# Patient Record
Sex: Female | Born: 1935 | Race: Black or African American | Hispanic: No | Smoking: Never smoker
Health system: Southern US, Community
[De-identification: ages and names within clinical notes are randomized; demographics above are authoritative.]

## PROBLEM LIST (undated history)

## (undated) DIAGNOSIS — IMO0002 Reserved for concepts with insufficient information to code with codable children: Secondary | ICD-10-CM

## (undated) DIAGNOSIS — Z91148 Patient's other noncompliance with medication regimen for other reason: Secondary | ICD-10-CM

## (undated) DIAGNOSIS — L0291 Cutaneous abscess, unspecified: Secondary | ICD-10-CM

## (undated) DIAGNOSIS — H409 Unspecified glaucoma: Secondary | ICD-10-CM

## (undated) DIAGNOSIS — K55039 Acute (reversible) ischemia of large intestine, extent unspecified: Secondary | ICD-10-CM

## (undated) DIAGNOSIS — M1990 Unspecified osteoarthritis, unspecified site: Secondary | ICD-10-CM

## (undated) DIAGNOSIS — I10 Essential (primary) hypertension: Secondary | ICD-10-CM

## (undated) DIAGNOSIS — G8929 Other chronic pain: Secondary | ICD-10-CM

## (undated) DIAGNOSIS — I639 Cerebral infarction, unspecified: Secondary | ICD-10-CM

## (undated) DIAGNOSIS — T7840XA Allergy, unspecified, initial encounter: Secondary | ICD-10-CM

## (undated) DIAGNOSIS — I701 Atherosclerosis of renal artery: Secondary | ICD-10-CM

## (undated) DIAGNOSIS — L089 Local infection of the skin and subcutaneous tissue, unspecified: Secondary | ICD-10-CM

## (undated) DIAGNOSIS — E039 Hypothyroidism, unspecified: Secondary | ICD-10-CM

## (undated) DIAGNOSIS — I2510 Atherosclerotic heart disease of native coronary artery without angina pectoris: Secondary | ICD-10-CM

## (undated) DIAGNOSIS — M545 Low back pain, unspecified: Secondary | ICD-10-CM

## (undated) DIAGNOSIS — N189 Chronic kidney disease, unspecified: Secondary | ICD-10-CM

## (undated) DIAGNOSIS — G459 Transient cerebral ischemic attack, unspecified: Secondary | ICD-10-CM

## (undated) DIAGNOSIS — R7881 Bacteremia: Secondary | ICD-10-CM

## (undated) DIAGNOSIS — M48062 Spinal stenosis, lumbar region with neurogenic claudication: Secondary | ICD-10-CM

## (undated) DIAGNOSIS — I739 Peripheral vascular disease, unspecified: Secondary | ICD-10-CM

## (undated) DIAGNOSIS — K219 Gastro-esophageal reflux disease without esophagitis: Secondary | ICD-10-CM

## (undated) DIAGNOSIS — S62633A Displaced fracture of distal phalanx of left middle finger, initial encounter for closed fracture: Secondary | ICD-10-CM

## (undated) DIAGNOSIS — I809 Phlebitis and thrombophlebitis of unspecified site: Secondary | ICD-10-CM

## (undated) DIAGNOSIS — F419 Anxiety disorder, unspecified: Secondary | ICD-10-CM

## (undated) DIAGNOSIS — E785 Hyperlipidemia, unspecified: Secondary | ICD-10-CM

## (undated) DIAGNOSIS — H269 Unspecified cataract: Secondary | ICD-10-CM

## (undated) DIAGNOSIS — Z9114 Patient's other noncompliance with medication regimen: Secondary | ICD-10-CM

## (undated) DIAGNOSIS — I219 Acute myocardial infarction, unspecified: Secondary | ICD-10-CM

## (undated) HISTORY — DX: Anxiety disorder, unspecified: F41.9

## (undated) HISTORY — PX: FEMORAL BYPASS: SHX50

## (undated) HISTORY — DX: Chronic kidney disease, unspecified: N18.9

## (undated) HISTORY — PX: CATARACT EXTRACTION, BILATERAL: SHX1313

## (undated) HISTORY — DX: Unspecified glaucoma: H40.9

## (undated) HISTORY — DX: Unspecified cataract: H26.9

## (undated) HISTORY — DX: Peripheral vascular disease, unspecified: I73.9

## (undated) HISTORY — PX: EYE SURGERY: SHX253

## (undated) HISTORY — DX: Transient cerebral ischemic attack, unspecified: G45.9

## (undated) HISTORY — DX: Hyperlipidemia, unspecified: E78.5

## (undated) HISTORY — DX: Local infection of the skin and subcutaneous tissue, unspecified: L08.9

## (undated) HISTORY — PX: CORONARY ARTERY BYPASS GRAFT: SHX141

## (undated) HISTORY — DX: Cerebral infarction, unspecified: I63.9

## (undated) HISTORY — DX: Atherosclerotic heart disease of native coronary artery without angina pectoris: I25.10

## (undated) HISTORY — DX: Essential (primary) hypertension: I10

## (undated) HISTORY — DX: Allergy, unspecified, initial encounter: T78.40XA

---

## 2001-04-15 DIAGNOSIS — R7881 Bacteremia: Secondary | ICD-10-CM

## 2001-04-15 DIAGNOSIS — L0291 Cutaneous abscess, unspecified: Secondary | ICD-10-CM

## 2001-04-15 HISTORY — DX: Cutaneous abscess, unspecified: L02.91

## 2001-04-15 HISTORY — DX: Bacteremia: R78.81

## 2001-04-15 HISTORY — PX: CORONARY ANGIOPLASTY WITH STENT PLACEMENT: SHX49

## 2001-05-12 ENCOUNTER — Ambulatory Visit (HOSPITAL_COMMUNITY): Admission: RE | Admit: 2001-05-12 | Discharge: 2001-05-13 | Payer: Self-pay | Admitting: *Deleted

## 2001-05-12 ENCOUNTER — Encounter: Payer: Self-pay | Admitting: *Deleted

## 2001-10-13 HISTORY — PX: INCISION AND DRAINAGE ANTERIOR NECK: SHX1795

## 2001-10-25 ENCOUNTER — Inpatient Hospital Stay (HOSPITAL_COMMUNITY): Admission: EM | Admit: 2001-10-25 | Discharge: 2001-10-28 | Payer: Self-pay

## 2001-10-30 ENCOUNTER — Emergency Department (HOSPITAL_COMMUNITY): Admission: EM | Admit: 2001-10-30 | Discharge: 2001-10-30 | Payer: Self-pay | Admitting: Emergency Medicine

## 2001-11-01 ENCOUNTER — Inpatient Hospital Stay (HOSPITAL_COMMUNITY): Admission: EM | Admit: 2001-11-01 | Discharge: 2001-11-18 | Payer: Self-pay | Admitting: Emergency Medicine

## 2001-11-01 ENCOUNTER — Encounter: Payer: Self-pay | Admitting: Emergency Medicine

## 2001-11-01 ENCOUNTER — Encounter (INDEPENDENT_AMBULATORY_CARE_PROVIDER_SITE_OTHER): Payer: Self-pay | Admitting: *Deleted

## 2001-11-02 ENCOUNTER — Encounter: Payer: Self-pay | Admitting: Internal Medicine

## 2001-11-02 ENCOUNTER — Encounter: Payer: Self-pay | Admitting: Cardiology

## 2001-11-03 ENCOUNTER — Encounter: Payer: Self-pay | Admitting: Internal Medicine

## 2001-11-04 ENCOUNTER — Encounter: Payer: Self-pay | Admitting: Internal Medicine

## 2001-11-04 ENCOUNTER — Encounter: Payer: Self-pay | Admitting: Surgery

## 2001-11-08 ENCOUNTER — Encounter: Payer: Self-pay | Admitting: Internal Medicine

## 2001-11-13 ENCOUNTER — Encounter: Payer: Self-pay | Admitting: Internal Medicine

## 2001-11-25 ENCOUNTER — Ambulatory Visit (HOSPITAL_COMMUNITY): Admission: RE | Admit: 2001-11-25 | Discharge: 2001-11-25 | Payer: Self-pay | Admitting: Infectious Diseases

## 2001-12-30 ENCOUNTER — Encounter: Admission: RE | Admit: 2001-12-30 | Discharge: 2001-12-30 | Payer: Self-pay | Admitting: Internal Medicine

## 2002-01-11 ENCOUNTER — Inpatient Hospital Stay (HOSPITAL_COMMUNITY): Admission: EM | Admit: 2002-01-11 | Discharge: 2002-01-13 | Payer: Self-pay | Admitting: Emergency Medicine

## 2002-03-03 ENCOUNTER — Encounter: Admission: RE | Admit: 2002-03-03 | Discharge: 2002-03-03 | Payer: Self-pay | Admitting: Internal Medicine

## 2002-03-08 ENCOUNTER — Encounter: Payer: Self-pay | Admitting: Internal Medicine

## 2002-03-08 ENCOUNTER — Ambulatory Visit (HOSPITAL_COMMUNITY): Admission: RE | Admit: 2002-03-08 | Discharge: 2002-03-08 | Payer: Self-pay | Admitting: Internal Medicine

## 2002-03-15 ENCOUNTER — Encounter: Admission: RE | Admit: 2002-03-15 | Discharge: 2002-03-15 | Payer: Self-pay | Admitting: Internal Medicine

## 2002-03-17 ENCOUNTER — Encounter: Admission: RE | Admit: 2002-03-17 | Discharge: 2002-03-17 | Payer: Self-pay | Admitting: Internal Medicine

## 2002-03-24 ENCOUNTER — Encounter: Admission: RE | Admit: 2002-03-24 | Discharge: 2002-03-24 | Payer: Self-pay | Admitting: Internal Medicine

## 2002-11-29 ENCOUNTER — Other Ambulatory Visit: Admission: RE | Admit: 2002-11-29 | Discharge: 2002-11-29 | Payer: Self-pay | Admitting: Family Medicine

## 2003-05-25 ENCOUNTER — Ambulatory Visit (HOSPITAL_COMMUNITY): Admission: RE | Admit: 2003-05-25 | Discharge: 2003-05-25 | Payer: Self-pay | Admitting: Family Medicine

## 2003-08-09 ENCOUNTER — Ambulatory Visit (HOSPITAL_COMMUNITY): Admission: RE | Admit: 2003-08-09 | Discharge: 2003-08-09 | Payer: Self-pay | Admitting: Gastroenterology

## 2004-04-11 ENCOUNTER — Ambulatory Visit: Payer: Self-pay | Admitting: Cardiology

## 2004-04-15 DIAGNOSIS — M48062 Spinal stenosis, lumbar region with neurogenic claudication: Secondary | ICD-10-CM

## 2004-04-15 HISTORY — DX: Spinal stenosis, lumbar region with neurogenic claudication: M48.062

## 2004-04-18 ENCOUNTER — Ambulatory Visit: Payer: Self-pay

## 2004-04-18 ENCOUNTER — Ambulatory Visit: Payer: Self-pay | Admitting: Cardiology

## 2004-04-23 ENCOUNTER — Ambulatory Visit: Payer: Self-pay | Admitting: Family Medicine

## 2004-05-11 ENCOUNTER — Encounter: Admission: RE | Admit: 2004-05-11 | Discharge: 2004-05-11 | Payer: Self-pay | Admitting: Family Medicine

## 2004-05-31 ENCOUNTER — Ambulatory Visit: Payer: Self-pay | Admitting: Cardiology

## 2004-06-13 HISTORY — PX: ENDOSCOPIC HEMILAMINOTOMY W/ DISCECTOMY LUMBAR: SUR439

## 2004-07-04 ENCOUNTER — Inpatient Hospital Stay (HOSPITAL_COMMUNITY): Admission: RE | Admit: 2004-07-04 | Discharge: 2004-07-10 | Payer: Self-pay | Admitting: Neurosurgery

## 2005-02-07 ENCOUNTER — Ambulatory Visit (HOSPITAL_COMMUNITY): Admission: RE | Admit: 2005-02-07 | Discharge: 2005-02-07 | Payer: Self-pay | Admitting: Family Medicine

## 2005-06-18 ENCOUNTER — Ambulatory Visit: Payer: Self-pay | Admitting: Cardiology

## 2005-07-02 ENCOUNTER — Encounter: Admission: RE | Admit: 2005-07-02 | Discharge: 2005-07-02 | Payer: Self-pay | Admitting: Surgery

## 2006-06-19 ENCOUNTER — Ambulatory Visit: Payer: Self-pay | Admitting: Cardiology

## 2007-06-25 ENCOUNTER — Ambulatory Visit (HOSPITAL_COMMUNITY): Admission: RE | Admit: 2007-06-25 | Discharge: 2007-06-25 | Payer: Self-pay | Admitting: Family Medicine

## 2007-07-10 ENCOUNTER — Ambulatory Visit: Payer: Self-pay | Admitting: Cardiology

## 2007-09-16 ENCOUNTER — Encounter: Admission: RE | Admit: 2007-09-16 | Discharge: 2007-09-16 | Payer: Self-pay | Admitting: Surgery

## 2008-01-13 ENCOUNTER — Ambulatory Visit: Payer: Self-pay | Admitting: Cardiology

## 2008-02-26 ENCOUNTER — Encounter: Admission: RE | Admit: 2008-02-26 | Discharge: 2008-02-26 | Payer: Self-pay | Admitting: Internal Medicine

## 2008-04-11 DIAGNOSIS — I739 Peripheral vascular disease, unspecified: Secondary | ICD-10-CM | POA: Insufficient documentation

## 2008-04-11 DIAGNOSIS — I2510 Atherosclerotic heart disease of native coronary artery without angina pectoris: Secondary | ICD-10-CM

## 2008-04-11 DIAGNOSIS — I10 Essential (primary) hypertension: Secondary | ICD-10-CM

## 2008-04-11 DIAGNOSIS — E785 Hyperlipidemia, unspecified: Secondary | ICD-10-CM

## 2008-07-12 ENCOUNTER — Encounter: Payer: Self-pay | Admitting: Cardiology

## 2008-07-12 ENCOUNTER — Ambulatory Visit: Payer: Self-pay | Admitting: Cardiology

## 2008-07-21 ENCOUNTER — Ambulatory Visit: Payer: Self-pay

## 2008-07-21 ENCOUNTER — Ambulatory Visit: Payer: Self-pay | Admitting: Cardiology

## 2008-07-21 LAB — CONVERTED CEMR LAB
Alkaline Phosphatase: 113 units/L (ref 39–117)
Cholesterol: 200 mg/dL (ref 0–200)
LDL Cholesterol: 130 mg/dL — ABNORMAL HIGH (ref 0–99)
Triglycerides: 73 mg/dL (ref 0.0–149.0)

## 2008-09-13 HISTORY — PX: CORONARY ANGIOPLASTY WITH STENT PLACEMENT: SHX49

## 2008-09-27 ENCOUNTER — Ambulatory Visit: Payer: Self-pay | Admitting: Cardiology

## 2008-09-27 ENCOUNTER — Inpatient Hospital Stay (HOSPITAL_COMMUNITY): Admission: EM | Admit: 2008-09-27 | Discharge: 2008-10-01 | Payer: Self-pay | Admitting: Emergency Medicine

## 2008-09-27 ENCOUNTER — Encounter: Payer: Self-pay | Admitting: Cardiology

## 2008-09-28 ENCOUNTER — Encounter: Payer: Self-pay | Admitting: Cardiology

## 2008-09-30 ENCOUNTER — Encounter: Payer: Self-pay | Admitting: Cardiology

## 2008-10-04 ENCOUNTER — Telehealth: Payer: Self-pay | Admitting: Cardiology

## 2008-10-20 ENCOUNTER — Encounter: Payer: Self-pay | Admitting: Cardiology

## 2008-10-21 ENCOUNTER — Ambulatory Visit: Payer: Self-pay | Admitting: Cardiology

## 2008-11-02 ENCOUNTER — Telehealth: Payer: Self-pay | Admitting: Cardiology

## 2008-12-20 ENCOUNTER — Ambulatory Visit: Payer: Self-pay | Admitting: Cardiology

## 2009-04-21 ENCOUNTER — Encounter (INDEPENDENT_AMBULATORY_CARE_PROVIDER_SITE_OTHER): Payer: Self-pay | Admitting: *Deleted

## 2009-05-05 ENCOUNTER — Ambulatory Visit: Payer: Self-pay | Admitting: Internal Medicine

## 2009-05-05 ENCOUNTER — Inpatient Hospital Stay (HOSPITAL_COMMUNITY): Admission: EM | Admit: 2009-05-05 | Discharge: 2009-05-10 | Payer: Self-pay | Admitting: Emergency Medicine

## 2009-06-07 ENCOUNTER — Telehealth (INDEPENDENT_AMBULATORY_CARE_PROVIDER_SITE_OTHER): Payer: Self-pay | Admitting: *Deleted

## 2009-06-07 ENCOUNTER — Encounter: Payer: Self-pay | Admitting: Cardiology

## 2009-06-08 ENCOUNTER — Ambulatory Visit: Payer: Self-pay | Admitting: Cardiology

## 2009-07-27 ENCOUNTER — Encounter: Payer: Self-pay | Admitting: Cardiology

## 2009-07-27 ENCOUNTER — Ambulatory Visit: Payer: Self-pay

## 2009-11-02 ENCOUNTER — Telehealth: Payer: Self-pay | Admitting: Cardiology

## 2010-05-03 ENCOUNTER — Telehealth: Payer: Self-pay | Admitting: Cardiology

## 2010-05-06 ENCOUNTER — Encounter: Payer: Self-pay | Admitting: Family Medicine

## 2010-05-15 NOTE — Progress Notes (Signed)
Summary: stop plavix & asa  Phone Note From Other Clinic   Caller: nurse Santiago Glad  Summary of Call: pt needs to have colonscopy and needs to stop plavix and ASA Saturday. Is pt ok to do this?? Ofc [phone] fax [phone] Initial call taken by: Lorraine Lax,  November 02, 2009 2:25 PM  Follow-up for Phone Call        I talked with DOD Dr. Ron Parker.  Pt cannot come off either ASA or Plavix.  [facility] nurse Santiago Glad is aware of this and will contact [name] to see if she still  wants to have the study done.  She understands if intervention is indicated it will need to be  done at a later time after discussing results with Dr. Percival Spanish because she is on Plavix and ASA.  She will also be reminded of 6 month follow up with Dr. Percival Spanish which is due in August. Joelyn Oms RN

## 2010-05-15 NOTE — Assessment & Plan Note (Signed)
Summary: APPT @ 4:15/EPH/JML   Visit Type:  Follow-up Primary Provider:  Dr. Henrene Pastor ([place])  CC:  CAD.  History of Present Illness: The patient presents for followup after a recent hospitalization for management of a non-Q-wave myocardial infarction. B. anatomy as described below. Ultimately she was managed medically. Since that time she has done relatively well. She has had none of the chest discomfort that prompted her previous hospitalization. She has not required any nitroglycerin. He she has been breathing well. She is able to lie flat back or PND or orthopnea. She does have some occasional pain in her thigh on the right side in particular similar to previous claudication.  Current Medications (verified): 1)  Calcium .... Daily 2)  Simvastatin 40 Mg Tabs (Simvastatin) .... One By Mouth Qhs 3)  Vitamin D3 .... Daily 4)  Nitroglycerin 0.4 Mg Subl (Nitroglycerin) .... As Needed 5)  Ramipril 10 Mg Caps (Ramipril) .... One By Mouth Daily 6)  Ultram 50 Mg Tabs (Tramadol Hcl) .... As Needed 7)  Metoprolol Succinate 50 Mg Xr24h-Tab (Metoprolol Succinate) .... One By Mouth Daily 8)  Aspirin 325 Mg  Tabs (Aspirin) .... Daily 9)  Multivitamins   Tabs (Multiple Vitamin) .... Daily 10)  Fosamax 70 Mg Tabs (Alendronate Sodium) .Marland Kitchen.. 1po Weekly 11)  Vitamin B-12 250 Mcg Tabs (Cyanocobalamin) .Marland Kitchen.. 1 By Mouth Daily 12)  Lumigan 0.03 % Soln (Bimatoprost) .... As Directed 13)  Plavix 75 Mg Tabs (Clopidogrel Bisulfate) .Marland Kitchen.. 1 By Mouth Daily 14)  Alphagan P 0.15 % Soln (Brimonidine Tartrate) .... As Directed  Allergies (verified): 1)  ! Penicillin 2)  ! Benadryl  Past History:  Past Medical History: CAD (see below) Hyperlipidemia Hypertension Peripheral vascular disease Hyperparathyroidism  Review of Systems       As stated in the HPI and negative for all other systems.   Vital Signs:  Patient profile:   77 year old female Height:      62 inches Weight:      118 pounds BMI:      21.66 Pulse rate:   55 / minute Resp:     16 per minute BP sitting:   116 / 62  (right arm)  Vitals Entered By: Levora Angel, CNA (June 08, 2009 4:16 PM)  Physical Exam  General:  Well developed, well nourished, in no acute distress. Head:  normocephalic and atraumatic Eyes:  PERRLA/EOM intact; conjunctiva and lids normal. Mouth:  Teeth, gums and palate normal. Oral mucosa normal. Neck:  Neck supple, no JVD. No masses, thyromegaly or abnormal cervical nodes. Chest Wall:  no deformities or breast masses noted Lungs:  Clear bilaterally to auscultation and percussion. Abdomen:  Bowel sounds positive; abdomen soft and non-tender without masses, organomegaly, or hernias noted. No hepatosplenomegaly. Msk:  Back normal, normal gait. Muscle strength and tone normal. Extremities:  No clubbing or cyanosis. Neurologic:  Alert and oriented x 3. Skin:  Intact without lesions or rashes. Psych:  Normal affect.   Detailed Cardiovascular Exam  Neck    Carotids: Carotids full and equal bilaterally without bruits.      Neck Veins: Normal, no JVD.    Heart    Inspection: no deformities or lifts noted.      Palpation: normal PMI with no thrills palpable.      Auscultation: regular rate and rhythm, S1, S2 without murmurs, rubs, gallops, or clicks.    Vascular    Abdominal Aorta: no palpable masses, pulsations, or audible bruits.      Pedal  Pulses: absent right dorsalis pedis pulse, absent right posterior tibial pulse, absent left dorsalis pedis pulse, and absent left posterior tibial pulse.      Radial Pulses: normal radial pulses bilaterally.      Peripheral Circulation: no clubbing, cyanosis, or edema noted with normal capillary refill.     Cardiac Cath  Procedure date:  05/09/2009  Findings:      1. Cardiac catheterization dated May 08, 2009, revealing a     moderate-sized obtuse marginal with a 90% stenosis at the ostium     with moderate, but not flow limiting stenosis in the  right coronary     artery, which was found to be in-stent restenosis. 2. Status post repeat cardiac catheterization, May 09, 2008, per     Dr. Olevia Perches, finding the lesion in the ostium of the circumflex     artery does not appear to be flow limiting and the lesion just past     the ramus in the circumflex artery, did not appear to be flow     limiting, it is still possible that these lesions could be     responsible for the patient's ACS, it is possible that could be a     ruptured plaque with possible distal embolization.  Nevertheless,     this lesion is very unfavorable for PCI.  Medical therapy is     indicated.  EKG  Procedure date:  06/08/2009  Findings:      sinus bradycardia, rate 55, axis within normal limits, intervals within normal limits, no acute ST-T wave changes.  Impression & Recommendations:  Problem # 1:  CAD, NATIVE VESSEL (ICD-414.01) The the patient has CAD as described. She is being managed medically and has no ongoing pain. No further cardiovascular testing is suggested. Orders: EKG w/ Interpretation (93000)  Problem # 2:  HYPERTENSION, BENIGN (ICD-401.1) Her blood pressure is well controlled. She will continue on the meds as listed. Orders: EKG w/ Interpretation (93000)  Problem # 3:  HYPERLIPIDEMIA TYPE IIB / III (ICD-272.2) I reviewed the lipid profile drawn in the hospital with a LDL of 98 and HDL of 61. This is acceptable and she will remain on the meds as listed but ultimately I would like an LDL less than 70. Her updated medication list for this problem includes:    Simvastatin 40 Mg Tabs (Simvastatin) ..... One by mouth qhs  Problem # 4:  PVD (ICD-443.9) She does have claudication. I reviewed her peripheral basilar studies from a few years ago and her ABIs were slightly improved at 0.77 bilaterally. I will repeat these.  Other Orders: Arterial Duplex Lower Extremity (Arterial Duplex Low)  Patient Instructions: 1)  Your physician recommends  that you schedule a follow-up appointment in: 6 months with Dr Percival Spanish 2)  Your physician recommends that you continue on your current medications as directed. Please refer to the Current Medication list given to you today. 3)  Your physician has requested that you have a lower extremity arterial exercise duplex.  During this test, exercise and ultrasound are used to evaluate arterial blood flow in the legs.  Allow one hour for this exam. There are no restrictions or special instructions. 4)  Your physician has requested that you have a lower or upper extremity arterial duplex.  This test is an ultrasound of the arteries in the legs or arms.  It looks at arterial blood flow in the legs and arms.  Allow one hour for Lower and Upper Arterial scans. There  are no restrictions or special instructions.

## 2010-05-15 NOTE — Progress Notes (Signed)
  Faxed records over to [facility]. to fax [place]  June 07, 2009 2:04 PM

## 2010-05-15 NOTE — Miscellaneous (Signed)
  Clinical Lists Changes  Observations: Added new observation of CARDCATHFIND: CONCLUSION:  Negative fractional flow reserve (FFR) of the ostial lesion of the circumflex artery with a value of 0.96.   RECOMMENDATIONS:  Based on these findings, the lesion in the ostium and circumflex artery does not appear to be flow-limiting and the lesion just past the ramus in the circumflex artery also does not appear to be flow-limiting. It is still possible that these lesions could be responsible for the patient's ACS.  It is possible it could be a ruptured plaque with possible distal embolization.  Nevertheless, this lesion is very unfavorable for PCI and I think medical therapy is indicated.     (05/10/2009 10:21) Added new observation of CARDCATHFIND: IMPRESSION:  This is an 77 year old status post non-ST elevation myocardial infarction.  I think the culprit vessel was probably a branch of the large high obtuse marginal.  This branch vessel is a moderate- sized vessel with a 90% stenosis at the ostium.  It is fairly distal.  The ostial circumflex is hazy in caudal views but in cranial views appears to have only mild stenosis.  There is also moderate, but not flow-limiting in-stent restenosis of the right coronary artery.  Ejection fraction is preserved.  The patient is to continue on Plavix.  I will discuss the case with Dr. Lia Foyer. (05/08/2009 10:22)      Cardiac Cath  Procedure date:  05/10/2009  Findings:      CONCLUSION:  Negative fractional flow reserve (FFR) of the ostial lesion of the circumflex artery with a value of 0.96.   RECOMMENDATIONS:  Based on these findings, the lesion in the ostium and circumflex artery does not appear to be flow-limiting and the lesion just past the ramus in the circumflex artery also does not appear to be flow-limiting. It is still possible that these lesions could be responsible for the patient's ACS.  It is possible it could be a ruptured  plaque with possible distal embolization.  Nevertheless, this lesion is very unfavorable for PCI and I think medical therapy is indicated.      Cardiac Cath  Procedure date:  05/08/2009  Findings:      IMPRESSION:  This is an 77 year old status post non-ST elevation myocardial infarction.  I think the culprit vessel was probably a branch of the large high obtuse marginal.  This branch vessel is a moderate- sized vessel with a 90% stenosis at the ostium.  It is fairly distal.  The ostial circumflex is hazy in caudal views but in cranial views appears to have only mild stenosis.  There is also moderate, but not flow-limiting in-stent restenosis of the right coronary artery.  Ejection fraction is preserved.  The patient is to continue on Plavix.  I will discuss the case with Dr. Lia Foyer.

## 2010-05-15 NOTE — Letter (Signed)
Summary: Appointment - [facility] [place] [place]  [address] [place]   [place], [place] [address]   Phone: [phone]  Fax: [phone]     April 21, 2009 MRN: [id number]   ANAJAH GIAMPIETRO [place], [place]  [address]   Dear Ms. Matsuoka,  Our records indicate that it is time to schedule a follow-up appointment. Dr.Hochrein recommended that you follow up with [place] in March,2011. It is very important that we reach you to schedule this appointment. We look forward to participating in your health care needs. Please contact us at the number listed above at your earliest convenience to schedule your appointment.  If you are unable to make an appointment at this time, give [place] a call so we can update our records.     Sincerely,   Public relations account executive

## 2010-05-17 NOTE — Progress Notes (Signed)
Summary: rx refill  Phone Note Refill Request Message from:  Patient on May 03, 2010 4:32 PM  Refills Requested: Medication #1:  METOPROLOL SUCCINATE 50 MG XR24H-TAB one by mouth daily  Medication #2:  PLAVIX 75 MG TABS 1 by mouth daily [facility] # [phone]   Method Requested: Telephone to Pharmacy Initial call taken by: Regan Lemming,  May 03, 2010 4:32 PM  Follow-up for Phone Call        Pt needs to make an appt with Dr. Percival Spanish to f/u. Levora Angel, CNA  May 04, 2010 11:04 AM  Follow-up by: Levora Angel, CNA,  May 04, 2010 11:04 AM    New/Updated Medications: METOPROLOL SUCCINATE 50 MG XR24H-TAB (METOPROLOL SUCCINATE) one by mouth daily PLAVIX 75 MG TABS (CLOPIDOGREL BISULFATE) 1 by mouth daily Prescriptions: METOPROLOL SUCCINATE 50 MG XR24H-TAB (METOPROLOL SUCCINATE) one by mouth daily  #30 x 1   Entered by:   Levora Angel, CNA   Authorized by:   Minus Breeding, MD, [facility]   Signed by:   Levora Angel, CNA on 05/04/2010   Method used:   Electronically to        [facility] [id number] (retail)       [address] [place] [address] [place], [place]  [address]       Ph: [phone]       Fax: [phone]   [id number] PLAVIX 75 MG TABS (CLOPIDOGREL BISULFATE) 1 by mouth daily  #30 x 1   Entered by:   Levora Angel, CNA   Authorized by:   Minus Breeding, MD, [facility]   Signed by:   Levora Angel, CNA on 05/04/2010   Method used:   Electronically to        [facility] [id number] (retail)       [address] [place] [address] [place], [place]  [address]       Ph: [phone]       Fax: [phone]   RxID:   [id number]

## 2010-07-01 LAB — CBC
HCT: 32.7 % — ABNORMAL LOW (ref 36.0–46.0)
HCT: 33.6 % — ABNORMAL LOW (ref 36.0–46.0)
HCT: 35.3 % — ABNORMAL LOW (ref 36.0–46.0)
HCT: 36 % (ref 36.0–46.0)
HCT: 37.5 % (ref 36.0–46.0)
Hemoglobin: 10.8 g/dL — ABNORMAL LOW (ref 12.0–15.0)
Hemoglobin: 11.4 g/dL — ABNORMAL LOW (ref 12.0–15.0)
Hemoglobin: 11.6 g/dL — ABNORMAL LOW (ref 12.0–15.0)
Hemoglobin: 12.3 g/dL (ref 12.0–15.0)
Hemoglobin: 12.6 g/dL (ref 12.0–15.0)
MCHC: 33.6 g/dL (ref 30.0–36.0)
MCHC: 34 g/dL (ref 30.0–36.0)
MCHC: 34.8 g/dL (ref 30.0–36.0)
MCV: 94.8 fL (ref 78.0–100.0)
MCV: 95 fL (ref 78.0–100.0)
MCV: 95.4 fL (ref 78.0–100.0)
MCV: 95.7 fL (ref 78.0–100.0)
Platelets: 153 10*3/uL (ref 150–400)
Platelets: 153 10*3/uL (ref 150–400)
RBC: 3.31 MIL/uL — ABNORMAL LOW (ref 3.87–5.11)
RBC: 3.45 MIL/uL — ABNORMAL LOW (ref 3.87–5.11)
RBC: 3.51 MIL/uL — ABNORMAL LOW (ref 3.87–5.11)
RBC: 3.69 MIL/uL — ABNORMAL LOW (ref 3.87–5.11)
RBC: 3.94 MIL/uL (ref 3.87–5.11)
RDW: 13.1 % (ref 11.5–15.5)
RDW: 13.4 % (ref 11.5–15.5)
WBC: 10.6 10*3/uL — ABNORMAL HIGH (ref 4.0–10.5)
WBC: 6.6 10*3/uL (ref 4.0–10.5)
WBC: 7.1 10*3/uL (ref 4.0–10.5)
WBC: 8.9 K/uL (ref 4.0–10.5)

## 2010-07-01 LAB — CK TOTAL AND CKMB (NOT AT ARMC)
CK, MB: 4 ng/mL (ref 0.3–4.0)
Relative Index: 1.9 (ref 0.0–2.5)
Total CK: 212 U/L — ABNORMAL HIGH (ref 7–177)

## 2010-07-01 LAB — TROPONIN I: Troponin I: 0.07 ng/mL — ABNORMAL HIGH (ref 0.00–0.06)

## 2010-07-01 LAB — CARDIAC PANEL(CRET KIN+CKTOT+MB+TROPI)
CK, MB: 8.8 ng/mL (ref 0.3–4.0)
Relative Index: 3.6 — ABNORMAL HIGH (ref 0.0–2.5)
Total CK: 391 U/L — ABNORMAL HIGH (ref 7–177)
Troponin I: 3.43 ng/mL (ref 0.00–0.06)
Troponin I: 3.94 ng/mL (ref 0.00–0.06)

## 2010-07-01 LAB — APTT: aPTT: 25 seconds (ref 24–37)

## 2010-07-01 LAB — COMPREHENSIVE METABOLIC PANEL
ALT: 37 U/L — ABNORMAL HIGH (ref 0–35)
Calcium: 10.6 mg/dL — ABNORMAL HIGH (ref 8.4–10.5)
Chloride: 108 mEq/L (ref 96–112)
Creatinine, Ser: 0.7 mg/dL (ref 0.4–1.2)
Glucose, Bld: 109 mg/dL — ABNORMAL HIGH (ref 70–99)
Total Protein: 6.4 g/dL (ref 6.0–8.3)

## 2010-07-01 LAB — COMPREHENSIVE METABOLIC PANEL WITH GFR
AST: 46 U/L — ABNORMAL HIGH (ref 0–37)
Albumin: 3.3 g/dL — ABNORMAL LOW (ref 3.5–5.2)
Alkaline Phosphatase: 118 U/L — ABNORMAL HIGH (ref 39–117)
BUN: 13 mg/dL (ref 6–23)
CO2: 23 meq/L (ref 19–32)
GFR calc Af Amer: >60 mL/min (ref >60)
GFR calc non Af Amer: >60 mL/min (ref >60)
Potassium: 4.3 meq/L (ref 3.5–5.1)
Sodium: 138 meq/L (ref 135–145)
Total Bilirubin: 0.4 mg/dL (ref 0.3–1.2)

## 2010-07-01 LAB — LIPID PANEL
HDL: 61 mg/dL (ref >39)
Total CHOL/HDL Ratio: 3.1 RATIO
VLDL: 30 mg/dL (ref 0–40)

## 2010-07-01 LAB — PROTIME-INR
INR: 1.03 (ref 0.00–1.49)
Prothrombin Time: 13.4 s (ref 11.6–15.2)

## 2010-07-01 LAB — HEPARIN LEVEL (UNFRACTIONATED): Heparin Unfractionated: 0.39 IU/mL (ref 0.30–0.70)

## 2010-07-01 LAB — DIFFERENTIAL
Basophils Absolute: 0.1 K/uL (ref 0.0–0.1)
Basophils Relative: 1 % (ref 0–1)
Eosinophils Absolute: 0 K/uL (ref 0.0–0.7)
Eosinophils Relative: 0 % (ref 0–5)
Lymphocytes Relative: 17 % (ref 12–46)
Lymphs Abs: 1.5 10*3/uL (ref 0.7–4.0)
Monocytes Absolute: 0.4 10*3/uL (ref 0.1–1.0)
Monocytes Relative: 4 % (ref 3–12)
Neutro Abs: 6.9 10*3/uL (ref 1.7–7.7)
Neutrophils Relative %: 77 % (ref 43–77)

## 2010-07-01 LAB — BASIC METABOLIC PANEL
CO2: 26 mEq/L (ref 19–32)
Calcium: 9.9 mg/dL (ref 8.4–10.5)
Chloride: 108 mEq/L (ref 96–112)
GFR calc Af Amer: >60 mL/min (ref >60)
GFR calc Af Amer: >60 mL/min (ref >60)
GFR calc non Af Amer: >60 mL/min (ref >60)
Glucose, Bld: 109 mg/dL — ABNORMAL HIGH (ref 70–99)
Sodium: 140 mEq/L (ref 135–145)
Sodium: 142 mEq/L (ref 135–145)

## 2010-07-01 LAB — LIPASE, BLOOD: Lipase: 12 U/L (ref 11–59)

## 2010-07-04 ENCOUNTER — Other Ambulatory Visit: Payer: Self-pay | Admitting: Cardiology

## 2010-07-12 ENCOUNTER — Other Ambulatory Visit: Payer: Self-pay | Admitting: *Deleted

## 2010-07-12 MED ORDER — CLOPIDOGREL BISULFATE 75 MG PO TABS: 75.0000 mg | ORAL_TABLET | Freq: Every day | ORAL | Status: DC

## 2010-07-23 LAB — CBC
HCT: 31.9 % — ABNORMAL LOW (ref 36.0–46.0)
HCT: 32.6 % — ABNORMAL LOW (ref 36.0–46.0)
HCT: 36.7 % (ref 36.0–46.0)
Hemoglobin: 11 g/dL — ABNORMAL LOW (ref 12.0–15.0)
Hemoglobin: 11.7 g/dL — ABNORMAL LOW (ref 12.0–15.0)
MCHC: 33.7 g/dL (ref 30.0–36.0)
MCHC: 33.9 g/dL (ref 30.0–36.0)
MCHC: 34.4 g/dL (ref 30.0–36.0)
MCV: 96.1 fL (ref 78.0–100.0)
MCV: 97 fL (ref 78.0–100.0)
MCV: 96.6 fL (ref 78.0–100.0)
Platelets: 118 10*3/uL — ABNORMAL LOW (ref 150–400)
Platelets: 143 10*3/uL — ABNORMAL LOW (ref 150–400)
Platelets: 142 10*3/uL — ABNORMAL LOW (ref 150–400)
RBC: 3.4 MIL/uL — ABNORMAL LOW (ref 3.87–5.11)
RBC: 3.59 MIL/uL — ABNORMAL LOW (ref 3.87–5.11)
RDW: 12.8 % (ref 11.5–15.5)
RDW: 13.1 % (ref 11.5–15.5)
RDW: 13 % (ref 11.5–15.5)
WBC: 7 10*3/uL (ref 4.0–10.5)
WBC: 9 10*3/uL (ref 4.0–10.5)

## 2010-07-23 LAB — CARDIAC PANEL(CRET KIN+CKTOT+MB+TROPI)
CK, MB: 8.1 ng/mL — ABNORMAL HIGH (ref 0.3–4.0)
Relative Index: 2.6 — ABNORMAL HIGH (ref 0.0–2.5)
Total CK: 306 U/L — ABNORMAL HIGH (ref 7–177)
Troponin I: 0.35 ng/mL — ABNORMAL HIGH (ref 0.00–0.06)

## 2010-07-23 LAB — BASIC METABOLIC PANEL
BUN: 15 mg/dL (ref 6–23)
BUN: 13 mg/dL (ref 6–23)
CO2: 26 mEq/L (ref 19–32)
CO2: 26 mEq/L (ref 19–32)
CO2: 27 mEq/L (ref 19–32)
Calcium: 10.6 mg/dL — ABNORMAL HIGH (ref 8.4–10.5)
Calcium: 10.8 mg/dL — ABNORMAL HIGH (ref 8.4–10.5)
Chloride: 109 mEq/L (ref 96–112)
Creatinine, Ser: 0.87 mg/dL (ref 0.4–1.2)
GFR calc Af Amer: >60 mL/min (ref >60)
GFR calc Af Amer: >60 mL/min (ref >60)
GFR calc non Af Amer: 53 mL/min — ABNORMAL LOW (ref >60)
GFR calc non Af Amer: >60 mL/min (ref >60)
GFR calc non Af Amer: >60 mL/min (ref >60)
Glucose, Bld: 100 mg/dL — ABNORMAL HIGH (ref 70–99)
Glucose, Bld: 103 mg/dL — ABNORMAL HIGH (ref 70–99)
Glucose, Bld: 109 mg/dL — ABNORMAL HIGH (ref 70–99)
Potassium: 3.9 mEq/L (ref 3.5–5.1)
Potassium: 4.2 mEq/L (ref 3.5–5.1)
Potassium: 4.8 mEq/L (ref 3.5–5.1)
Sodium: 140 mEq/L (ref 135–145)
Sodium: 142 mEq/L (ref 135–145)

## 2010-07-23 LAB — URINALYSIS, ROUTINE W REFLEX MICROSCOPIC
Nitrite: POSITIVE — AB
Specific Gravity, Urine: 1.011 (ref 1.005–1.030)
Urobilinogen, UA: 0.2 mg/dL (ref 0.0–1.0)

## 2010-07-23 LAB — LIPID PANEL
Cholesterol: 219 mg/dL — ABNORMAL HIGH (ref 0–200)
LDL Cholesterol: 141 mg/dL — ABNORMAL HIGH (ref 0–99)

## 2010-07-23 LAB — POCT CARDIAC MARKERS

## 2010-07-23 LAB — URINE MICROSCOPIC-ADD ON

## 2010-07-23 LAB — DIFFERENTIAL
Basophils Absolute: 0 10*3/uL (ref 0.0–0.1)
Eosinophils Absolute: 0.1 10*3/uL (ref 0.0–0.7)
Eosinophils Relative: 1 % (ref 0–5)
Lymphocytes Relative: 16 % (ref 12–46)

## 2010-07-23 LAB — CK TOTAL AND CKMB (NOT AT ARMC)
CK, MB: 10.8 ng/mL — ABNORMAL HIGH (ref 0.3–4.0)
Relative Index: 3.7 — ABNORMAL HIGH (ref 0.0–2.5)
Total CK: 294 U/L — ABNORMAL HIGH (ref 7–177)

## 2010-07-23 LAB — TSH: TSH: 0.327 u[IU]/mL — ABNORMAL LOW (ref 0.350–4.500)

## 2010-07-23 LAB — TROPONIN I: Troponin I: 0.07 ng/mL — ABNORMAL HIGH (ref 0.00–0.06)

## 2010-08-08 ENCOUNTER — Encounter: Payer: Self-pay | Admitting: Cardiology

## 2010-08-08 ENCOUNTER — Encounter: Payer: Self-pay | Admitting: *Deleted

## 2010-08-09 ENCOUNTER — Ambulatory Visit (INDEPENDENT_AMBULATORY_CARE_PROVIDER_SITE_OTHER): Payer: Medicare Other | Admitting: Cardiology

## 2010-08-09 ENCOUNTER — Encounter: Payer: Self-pay | Admitting: Cardiology

## 2010-08-09 VITALS — BP 122/78 | HR 67 | Resp 18 | Ht 62.0 in | Wt 115.0 lb

## 2010-08-09 DIAGNOSIS — I2510 Atherosclerotic heart disease of native coronary artery without angina pectoris: Secondary | ICD-10-CM

## 2010-08-09 DIAGNOSIS — E782 Mixed hyperlipidemia: Secondary | ICD-10-CM

## 2010-08-09 DIAGNOSIS — I739 Peripheral vascular disease, unspecified: Secondary | ICD-10-CM

## 2010-08-09 DIAGNOSIS — I10 Essential (primary) hypertension: Secondary | ICD-10-CM

## 2010-08-09 MED ORDER — METOPROLOL SUCCINATE ER 50 MG PO TB24: 50.0000 mg | ORAL_TABLET | Freq: Every day | ORAL | Status: DC

## 2010-08-09 NOTE — Assessment & Plan Note (Signed)
>>  ASSESSMENT AND PLAN FOR HYPERTENSION WRITTEN ON 08/09/2010  1:12 PM BY Minus Breeding, MD  Her blood pressure is controlled and she will continue meds as listed.

## 2010-08-09 NOTE — Assessment & Plan Note (Signed)
I reviewed her ABIs from last year. They remain stable and mildly reduced. She has no new symptoms. She will continue with risk reduction.

## 2010-08-09 NOTE — Assessment & Plan Note (Signed)
She reports that she is followed by Dr. Henrene Pastor. I would suggest a goal LDL less than 100 and HDL greater than 40.

## 2010-08-09 NOTE — Patient Instructions (Signed)
Continue current medications See Dr Percival Spanish in one year

## 2010-08-09 NOTE — Progress Notes (Signed)
HPI The patient presents for followup of known coronary disease. Since I last saw her she has had no new cardiovascular complaints. She has noted chest pressure, neck or arm discomfort. She denies any palpitations, presyncope or syncope. She continues to have some mild occasional episodes of shortness of breath for which she will rarely nitroglycerin. Symptoms improved after this. She does have some chronic leg tiredness when walking but this is unchanged and she had ABIs in April of last year.  Allergies  Allergen Reactions  . Diphenhydramine Hcl   . Penicillins     Current Outpatient Prescriptions  Medication Sig Dispense Refill  . aspirin 325 MG tablet Take 325 mg by mouth daily.        . bimatoprost (LUMIGAN) 0.03 % ophthalmic drops 1 drop at bedtime.        . brimonidine (ALPHAGAN) 0.15 % ophthalmic solution 1 drop 3 (three) times daily.        . Cholecalciferol (VITAMIN D-3 PO) Take 400 Units by mouth daily.        . clopidogrel (PLAVIX) 75 MG tablet Take 1 tablet (75 mg total) by mouth daily.  30 tablet  11  . metoprolol (TOPROL-XL) 50 MG 24 hr tablet Take 50 mg by mouth daily.        . Multiple Vitamin (MULTIVITAMIN) capsule Take 1 capsule by mouth daily.        . nitroGLYCERIN (NITROSTAT) 0.4 MG SL tablet Place 0.4 mg under the tongue every 5 (five) minutes as needed.        . ramipril (ALTACE) 10 MG capsule Take 10 mg by mouth daily.        . simvastatin (ZOCOR) 40 MG tablet Take 40 mg by mouth at bedtime.        . traMADol (ULTRAM) 50 MG tablet Take 50 mg by mouth every 6 (six) hours as needed.        Marland Kitchen DISCONTD: alendronate (FOSAMAX) 70 MG tablet Take 70 mg by mouth every 7 (seven) days. Take with a full glass of water on an empty stomach.         Past Medical History  Diagnosis Date  . CAD (coronary artery disease)   . Hyperlipidemia   . HTN (hypertension)   . PVD (peripheral vascular disease)   . Hyperparathyroidism     Past Surgical History  Procedure Date  .  Femoral bypass     ROS:  As stated in the HPI and negative for all other systems, except for back pain  PHYSICAL EXAM BP 122/78  Pulse 67  Resp 18  Ht 5\' 2"  (1.575 m)  Wt 115 lb (52.164 kg)  BMI 21.03 kg/m2 GENERAL:  Well appearing HEENT:  Pupils equal round and reactive, fundi not visualized, oral mucosa unremarkable NECK:  No jugular venous distention, waveform within normal limits, carotid upstroke brisk and symmetric, no bruits, no thyromegaly LYMPHATICS:  No cervical, inguinal adenopathy LUNGS:  Clear to auscultation bilaterally BACK:  No CVA tenderness CHEST:  Unremarkable HEART:  PMI not displaced or sustained,S1 and S2 within normal limits, no S3, no S4, no clicks, no rubs, no murmurs ABD:  Flat, positive bowel sounds normal in frequency in pitch, no bruits, no rebound, no guarding, no midline pulsatile mass, no hepatomegaly, no splenomegaly EXT:  2 plus pulses upper, decreased PT/DP bilateral, no edema, no cyanosis no clubbing SKIN:  No rashes no nodules NEURO:  Cranial nerves II through XII grossly intact, motor grossly intact throughout PSYCH:  Cognitively intact, oriented to person place and time  EKG:  Sinus rhythm, rate 68, axis within normal limits, intervals within normal limits, no acute ST-T wave changes.  ASSESSMENT AND PLAN

## 2010-08-09 NOTE — Assessment & Plan Note (Signed)
Her blood pressure is controlled and she will continue meds as listed.

## 2010-08-09 NOTE — Assessment & Plan Note (Signed)
The patient is being managed medically for known coronary disease. She has no unstable symptoms. She will continue meds as listed.

## 2010-08-17 ENCOUNTER — Other Ambulatory Visit: Payer: Self-pay | Admitting: Cardiology

## 2010-08-28 NOTE — Assessment & Plan Note (Signed)
[facility] OFFICE NOTE   DONN, MASTIN                       MRN:          [id number]  DATE:01/13/2008                            DOB:          15-Nov-1925    PRIMARY CARE PHYSICIAN:  Judithe Modest, MD   REASON FOR PRESENTATION:  The patient with coronary and peripheral  vascular disease.   HISTORY OF PRESENT ILLNESS:  This patient is a pleasant 77 year old  African American female with the above problems.  She was seeing Dr.  Domenic Polite who is predominately practicing in [place] now.  This is my first  meeting with her.  I have extensively reviewed the chart.  Since last  being seen, she has had no new symptoms.  She is bothered by some mild  left leg cramping.  However, she does not have any pain with walking.  She does walk to the grocery store.  She does not do a lot of exercise.  She said the cramping actually has not been bothering her last couple of  weeks, but it can occasionally be bad at rest.  She had some problems  with her parathyroid apparently which may contribute.  She has had no  chest pressure, neck or arm discomfort.  She has had no palpitation,  presyncope, or syncope.  She denies any PND or orthopnea.   PAST MEDICAL HISTORY:  Coronary artery disease (status post stenting of  her right coronary artery and acute marginal in 2003.  Several months  later, she had angioplasty of a PDA lesion.  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   ALLERGIES/INTOLERANCES:  BENADRYL and PENICILLIN.   MEDICATIONS:  1. Aspirin 81 mg daily.  2. Toprol-XL 50 mg daily.  3.  Multivitamin.  4. Coenzyme Q.  5. Calcium 600 mg daily.  6. High-potency vitamin D.  7. Ramipril 10 mg daily.  8. Pravastatin 40 mg daily.  9. Vitamin.   REVIEW OF SYSTEMS:  As stated in the HPI and otherwise negative for all  other systems.   PHYSICAL EXAMINATION:  GENERAL:  The patient is pleasant and in no  distress.  VITAL SIGNS:  Blood pressure 110/69, heart rate 76 and regular, weight  122 pounds, body mass index 22.  HEENT:  Eyelids are unremarkable.  Pupils equal, round, and reactive to  light.  Fundi not visualized.  Oral mucosa unremarkable.  NECK:  No jugular venous distention at 45 degrees.  Carotid upstroke  brisk and symmetrical.  No bruits, no thyromegaly.  Lymphatics:  No cervical, axillary, or inguinal nodes.  LUNGS:  Clear to auscultation bilaterally.  BACK:  No costovertebral tenderness.  CHEST:  Unremarkable.  HEART:  PMI not displaced or sustained, S1 and S2 within normal limits.  No S3, no S4, no clicks, no rubs, or  no murmurs.  ABDOMEN:  Flat, positive bowel sounds, normal in frequency and pitch.  No bruits, no rebound, no guarding, or midline pulsatile mass.  No  hepatomegaly, no splenomegaly.  SKIN:  No rashes.  EXTREMITIES:  Upper pulses 2+, 2+ femorals, 1+ dorsalis pedis and  posterior tibialis bilaterally.  No cyanosis, no clubbing, no edema.  NEURO:  Oriented to person, place, and time.  Cranial nerves II through  XII grossly intact, motor grossly intact throughout.   EKG:  Sinus rhythm, rate 72, axis within normal limits, intervals within  normal limits, poor anterior R wave progression, low voltage in chest  leads.   ASSESSMENT AND PLAN:  1. Coronary artery disease.  The patient is having no new symptoms      suggestive of unstable angina.  At this point, she will continue      with secondary risk reduction.  No further cardiovascular testing      is planned.  2. Dyslipidemia.  She did tell me that her lipids were not good.      She had not  tolerated Zetia or Vytorin in the past.  However, she      has been started on pravastatin and I agree with this.  The goal      should be an LDL less than 100 and HDL greater than 50.  3. Peripheral vascular disease.  She is having some nighttime leg      cramping with no claudication.  At this point, no further      cardiovascular testing is suggested.  4. Hypertension.  Blood pressure is well controlled and she will      continue the medications as listed.  5. Followup.  I will see the patient again in 6 months.  Dr. Domenic Polite      had discussed the possible stress test at that time, as she has not      had one in 2006.  I will consider this based on future symptoms.     Minus Breeding, MD, [facility]  Electronically Signed    JH/MedQ  DD: 01/13/2008  DT: 01/14/2008  Job #: [id number]   cc:   Judithe Modest, M.D.

## 2010-08-28 NOTE — Cardiovascular Report (Signed)
[name], [name]                ACCOUNT NO.:  [id number]   MEDICAL RECORD NO.:  [id number]          PATIENT TYPE:  INP   LOCATION:  2921                         FACILITY:  [facility]   PHYSICIAN:  [name]. Lia Foyer, MD, FACCDATE OF BIRTH:  11-09-1925   DATE OF PROCEDURE:  09/28/2008  DATE OF DISCHARGE:                            CARDIAC CATHETERIZATION   INDICATIONS:  [name] is an 77 year old who has had prior stenting.  She now presents with an acute coronary syndrome characterized by  positive enzymes without EKG changes, but with recurrent anginal pain.  She was brought to the Catheterization Laboratory for evaluation.  The  risks, benefits and alternatives were discussed with the patient the  evening before.  She was agreeable to proceed.   PROCEDURE:  1. Left heart catheterization.  2. Selective coronary arteriography.  3. Selective left ventriculography.  4. Percutaneous stenting of the ramus intermedius using a 2.5 x 23      Cypher Select drug-eluting stent.   DESCRIPTION OF THE PERFORMED:  The procedure was performed via the right  femoral artery.  We initially placed a 5-French sheath.  Diagnostic  views were obtained.  Central aortic and left ventricular pressures were  obtained with a pigtail catheter.  I called Dr. Percival [name] in to the room.  We reviewed the films in detail.  Our initial plan was for provisional  stenting of the ramus intermedius which was not optimal for percutaneous  stenting.  The LAD is not critically diseased and the right coronary  artery has 2 new lesions in the previous areas that have not been  stented.  As a result, it was felt that the ramus was likely the acute  vessel particularly in light of lack of any EKG changes.  With this, the  feeling was that we needed to proceed.  I then spoke with the patient.  I subsequently called her daughter, [name].  She was agreeable to  proceed.  The 5-French was exchanged for a 6-French sheath.  A second  bolus of Integrilin was given.  A JL 3.5 guiding catheter was used.  ACT  was checked.  Heparin was given to prolong the ACT greater than 200  seconds.  The lesion was crossed with a Prowater wire and then  predilated with a 2 mm x 20 mm apex balloon.  There was marked  improvement in the appearance of the artery and the plaque appeared  relatively soft.  Importantly, there was elastic recoil with recurrent  haziness and we debated between upsizing to a 2.25 balloon or placing a  stent across the marginal takeoff.  I elected to place the drug-eluting  stent given the patient's history.  We then placed a Cypher Select 2.25  x 23 mm stent across the lesion with careful locations.  There was a  side branch coming off from the midst of the vessel.  The stent was then  deployed at about 11 atmospheres and then postdilated with a apex  Quantum noncompliant balloon 2.25 x 20 to 12-13 atmospheres.  There was  marked improvement in the appearance of the  artery and stabilization of  the lesion without wires in the side branch.  The procedure was  completed and the femoral sheath was sewn into place.  She was taken to  the holding area in satisfactory clinical condition.   HEMODYNAMIC DATA:  The initial central aortic pressure was 153/69, mean  101.  LV pressure was 144/13.  There is not a significant gradient on  pullback.   ANGIOGRAPHIC DATA:  1. Ventriculography reveals vigorous global systolic function.  There      may be mild mitral regurgitation.  2. The left main is free of critical disease.  3. The LAD has multiple areas of luminal irregularity.  There is a      tiny first diagonal branch with probably 80-90% narrowing, but does      not significant size.  There is 40% segmental plaquing in the mid      LAD.  The LAD has diffuse luminal irregularities, but no critical      stenosis.  4. The circumflex provides a large ramus which bifurcates.  There is a      subtotal occlusion leading  from the ostium of the takeoff of this      marginal branch where it bifurcates and just beyond the bifurcation      the one branch has 50% narrowing, the other branch has 70%      narrowing distally.  The plaque in the ramus extends into the      branch.  Following stenting, this is reduced to 0% residual luminal      narrowing with an excellent angiographic result.  The AV circumflex      has mild plaquing of probably 40% and supplies a part of      posterolateral vessel.   The right coronary artery is somewhat diffusely diseased.  There is a  stent in the proximal mid junction and a stent in the mid distal  junction.  Between the two stents, which are patent, there is a 90% area  of focal stenosis and areas of segmental plaquing followed by an 80%  stenosis prior to the crux.  The PDA is a large-caliber vessel.  Posterolateral branches have 70 and 70% narrowings respectively and a  smaller diffuse vessel.   CONCLUSION:  1. Preserved overall left ventricular function.  2. Successful percutaneous stenting of the ramus intermedius artery      using a drug-eluting platform.  3. High-grade residual disease in the right coronary artery.   DISPOSITION:  An elective PCI of the right coronary artery will be  recommended.  We will defer this for approximately 2 more days.  The  patient will need to stay on dual antiplatelet therapy for minimum of 1  year.  Followup will be with Dr. Percival [name].      [name]. Lia Foyer, MD, [facility]  Electronically Signed     TDS/MEDQ  D:  09/28/2008  T:  09/29/2008  Job:  [id number]   cc:   Minus Breeding, MD, [facility]  Judithe Modest, M.D.  CV Laboratory

## 2010-08-28 NOTE — Discharge Summary (Signed)
[name], [name]                ACCOUNT NO.:  [id number]   MEDICAL RECORD NO.:  [id number]          PATIENT TYPE:  INP   LOCATION:  2921                         FACILITY:  [facility]   PHYSICIAN:  Shaune Pascal. Bensimhon, [name]:  1925/06/21   DATE OF ADMISSION:  09/27/2008  DATE OF DISCHARGE:  10/01/2008                               DISCHARGE SUMMARY   PROCEDURES:  1. Cardiac catheterization.  2. Coronary arteriogram.  3. Left ventriculogram.  4. Percutaneous transluminal coronary angioplasty and drug-eluting      stent to the ramus intermedius.  5. Repeat catheterization with staged percutaneous intervention and      drug-eluting stent to the right coronary artery.   PRIMARY AND FINAL DISCHARGE DIAGNOSIS:  Non-ST segment elevation  myocardial infarction with a peak CK-MB of 294/10.8.   SECONDARY DIAGNOSES:  1. Hyperlipidemia with a total cholesterol of 219, triglycerides 103,      HDL 57, LDL 141.  2. Borderline hypothyroidism with a TSH of 0.327, recheck as an      outpatient after recovery.  3. Mild postprocedure anemia with a hemoglobin of 11.0 and hematocrit      of 31.9 at discharge.  4. Thrombocytopenia with platelets 142 on admission and 118 at      discharge.  5. Peripheral vascular disease status post left femoral-popliteal in      1995.  6. Hypertension.  7. Status post percutaneous transluminal coronary angioplasty and      stent to the distal and proximal right coronary artery in 2003.  8. Peripheral vascular disease with 40% left renal artery stenosis.  9. History of cataracts and glaucoma.  10.Allergy or intolerance to PENICILLIN, SHELLFISH, CODEINE, and      BENADRYL.  11.Probable urinary tract infection with nitrites positive, leukocytes      small, and many bacteria on urinalysis, and antibiotics initiated.   TIME AT DISCHARGE:  36 minutes.   HOSPITAL COURSE:  [name] is an 77 year old female with a previous  history of coronary artery disease.   She came to the hospital for chest  pain which started the day of admission.  She was admitted for further  evaluation and treatment.   Her cardiac enzymes elevated indicating a non-ST segment elevation MI.  Cardiac catheterization was initially performed on September 28, 2008, and  showed a 90% ramus intermedius reduced to 0.  There was a tiny first  diagonal branch with 80% narrowing, but it was not felt to be large  enough for intervention.  The LAD had a 40% stenosis and a branch of the  circumflex had a 70% stenosis.  The RCA was diffusely diseased with an  80-90% stenosis, but it was felt that the ramus was the culprit vessel  and the RCA should be done with a staged intervention.   On September 30, 2008, [name]'s labs were stable and she was taken back  to the Cath Lab.  The RCA was treated with PTCA and drug-eluting stents  x2 reducing the 2 lesions from 80% and 90% to 0.  She tolerated the  second procedure  well.   On October 01, 2008, [name] was seen by Cardiac Rehab.  A 2-D  echocardiogram had been performed, which showed an EF of 60-65% and  grade 2 diastolic dysfunction.  She stated she had a prescription plan  that covered medications and would be able to get her Plavix.  Urinalysis showed many bacteria and some white blood cells, so she will  complete antibiotics as an outpatient, as she is afebrile.  She was seen  by Cardiac Rehab and at this point is not wishing to do outpatient rehab  but knows that is available to her.  On October 01, 2008, [name] was  ambulating without chest pain or shortness of breath and Dr. Haroldine Laws  considered her stable for discharge with outpatient followup in  [place].   DISCHARGE INSTRUCTIONS:  1. Her activity level is to be increased gradually per cardiac rehab      guidelines.  2. She is to call our office for any problems with the cath site.  3. She is to stick to a low-sodium, heart-healthy diet.  4. She is to follow up with Dr.  Percival Spanish and our office will call.  5. She is to follow up with Dr. Debria Garret as needed.   DISCHARGE MEDICATIONS:  1. Aspirin 325 mg daily.  2. Plavix 75 mg daily.  3. Altace 10 mg daily.  4. Calcium coenzyme Q10 and vitamins as prior to admission.  5. Metoprolol ER 50 mg daily.  6. Pravachol 40 mg daily p.m.  7. Ultram 50 mg as prior to admission.  8. Nitroglycerin sublingual p.r.n.  9. Cipro 250 mg b.i.d. for 3 days.      Cindy Ferries, PA-C      Shaune Pascal. Bensimhon, MD  Electronically Signed    RB/MEDQ  D:  10/01/2008  T:  10/02/2008  Job:  [id number]   cc:   Judithe Modest, M.D.

## 2010-08-28 NOTE — H&P (Signed)
[name], [name]                ACCOUNT NO.:  [id number]   MEDICAL RECORD NO.:  [id number]          PATIENT TYPE:  INP   LOCATION:  2921                         FACILITY:  [facility]   PHYSICIAN:  Loretha Brasil. Lia Foyer, MD, FACCDATE OF BIRTH:  04/01/1926   DATE OF ADMISSION:  09/27/2008  DATE OF DISCHARGE:                              HISTORY & PHYSICAL   REASON FOR ADMISSION:  Chest pain.   PRIMARY CARDIOLOGIST:  Minus Breeding, MD, [facility]   PRIMARY CARE PHYSICIAN:  Judithe Modest, MD   HISTORY OF PRESENT ILLNESS:  An 77 year old African American female with  known history of CAD, peripheral vascular disease, hypertension, renal  artery stenosis with complaints of chest pain this a.m., which she  described as heavy heart pain while she was sitting in her kitchen,  became worse, lasting greater than 2 hours.  She took 2 sublingual  nitroglycerin at home with minimal relief.  Her daughter called EMS and  she was given more nitroglycerin en route and symptoms resolved.  The  pain gets worse with some movement or sitting up.  The patient has not  had pain like this before.  Her angina pain is similar to the pain, but  normally she has left arm pain, shortness of breath, and nausea which is  not the case at this time.  The patient is currently pain free during  this evaluation.   REVIEW OF SYSTEMS:  Positive for chest pain, generalized weakness.  All  other systems have been reviewed and are found to be negative.   PAST MEDICAL HISTORY:  1. Peripheral vascular disease status post left FEM-POP bypass surgery      in 1995.  2. Hypertension.  3. Hyperlipidemia.  4. CAD.      a.     PTCA and stent to the distal right coronary artery and       proximal right coronary artery in January 2003.  Followup cath in       July 2003 revealed widely patent stents with a new 90% PDA stent,       which had angioplasty at that time.      b.     Renal artery stenosis with 40% left renal artery  stenosis.   PAST SURGICAL HISTORY:  1. Cataracts and glaucoma, right eye 2 weeks ago.  2. FEM-POP on the left and appendectomy.  3. Recent catheterization in September 2003 reveals stable small      vessel CAD, LVEF at that time was 55%.   SOCIAL HISTORY:  She lives in [place], alone.  She is retired.  She is  widowed.  No smoking.  No EtOH history.  She has 4 children.  She did  have 5 though one of her son died.   CURRENT MEDICATIONS:  1. Aspirin 81 mg daily.  2. Toprol-XL 50 mg daily.  3. Multivitamin daily.  4. Coenzyme Q daily.  5. Ramipril 10 mg daily.  6. Pravastatin 40 mg daily.  7. Calcium daily.  8. Vitamin D daily.  9. Ultram 50 mg p.r.n.   ALLERGIES:  PENICILLIN, SHELLFISH, and  BENADRYL.   CURRENT LABORATORIES:  Troponin less than 0.05.  Sodium 140, potassium  4.8, chloride 109, CO2 of 28, BUN 13, creatinine 0.82, glucose 109.  Hemoglobin 12.5, hematocrit 36.7, white blood cells 9.3, platelets 142.  Calcium is elevated at 11.2.  EKG revealing normal sinus rhythm with a  rate of 61 beats per minute.  Chest x-ray reveals no acute  cardiopulmonary process identified.   PHYSICAL EXAMINATION:  VITAL SIGNS:  Blood pressure 130/70, pulse 65,  respirations 20, temperature 98, O2 sat 99% on room air.  GENERAL:  She is awake, alert, and oriented, good affect.  HEENT:  Head is normocephalic and atraumatic.  She has poor dentition.  The patient wears glasses.  NECK:  Supple without JVD or carotid bruits.  CARDIOVASCULAR:  Regular rate and rhythm without murmurs, rubs, or  gallops.  Pulses are 2+ and equal without bruits.  They are weaker on  the left lower extremity.  LUNGS:  Clear to auscultation without wheezes, rales, or rhonchi.  ABDOMEN:  Soft, nontender with 2+ bowel sounds.  EXTREMITIES:  Without clubbing, cyanosis, or edema.  NEURO:  Cranial nerves II through XII are grossly intact.  MUSCULOSKELETAL:  The chest pain is not reproducible with palpation.    IMPRESSION:  1. Chest pain worrisome for unstable angina.  2. Known coronary artery disease.  3. Peripheral arterial disease.  4. Hypertension.  5. Renal artery stenosis.   PLAN:  An 77 year old African American female with known history of CAD  with stent to the right coronary artery and PTCA of the PDA was admitted  to the ER after greater than 2 hours of substernal chest pain and  pressure, nonradiating with no associated shortness of breath or nausea,  relieved with sublingual nitroglycerin.  There were no EKG changes  suggestive of ischemia and negative point of care markers.  Our plan  will be to admit the patient to rule out MI.  Followup assessment reveal  that the patient has begun to have chest pain on the left side again  with some diminished pain after sublingual nitroglycerin.  Our plan will  be to start nitroglycerin IV along with heparin  IV.  The patient has been given 5 mg of IV Lopressor x1.  The patient  will be placed in ICU and will consider stress versus cath at Dr.  Rosezella Florida discretion.  In the interim, we will continue to monitor  closely and restart her home meds.      Phill Myron. Purcell Nails, NP      Loretha Brasil. Lia Foyer, MD, [facility]  Electronically Signed    KML/MEDQ  D:  09/27/2008  T:  09/28/2008  Job:  [id number]   cc:   Judithe Modest, M.D.

## 2010-08-28 NOTE — Cardiovascular Report (Signed)
NAMESALEM, [name]                ACCOUNT NO.:  [id number]   MEDICAL RECORD NO.:  [id number]          PATIENT TYPE:  INP   LOCATION:  2921                         FACILITY:  [facility]   PHYSICIAN:  Loretha Brasil. Lia Foyer, MD, FACCDATE OF BIRTH:  10-19-1925   DATE OF PROCEDURE:  09/30/2008  DATE OF DISCHARGE:                            CARDIAC CATHETERIZATION   [name] is a very delightful 77 year old who has presented with a non-  ST-elevation MI and recurrent angina.  She was brought to the Laboratory  2 days ago and underwent percutaneous stenting of subtotally occluded  ramus intermedius vessel that was complex.  She was brought back today  for repeat catheterization to reassess the circumflex, and also to  approach the right coronary artery.  Risks, benefits, and alternatives  have been discussed with the patient prior to the procedure.  She was  agreeable to proceed.   PROCEDURE:  1. Selective coronary arteriography after placement for angiography.  2. Percutaneous stenting of the right coronary artery.   DESCRIPTION OF PROCEDURE:  The patient was brought to the  Catheterization Laboratory and prepped and draped in the usual fashion.  Through an anterior puncture, the femoral artery was entered, just above  the previous site.  A 6-French sheath was placed.  Bivalirudin was then  given according to protocol.  A 6-French JL-3 left Judkins was then  utilized to review the previously placed stent.  This was widely patent.  We then elected to proceed with the right.  ACT was checked and found to  be appropriate for percutaneous intervention.  A JR-4 guiding catheter  was then utilized with side holes.  This was a reasonable seat to the  right coronary artery.  A BMW wire was then placed in the distal lesion,  specifically in the PDA.  Pre-dilatation was then done with a 2 mm x 12  apex balloon at both the distal and proximal lesions.  There was not  much resistance noted.  We then went  ahead and placed a 2.25 x 13 Cypher  Select stent in the distal vessel.  This was taken up to about 11  atmospheres.  The balloon was then retracted.  This was followed by a  2.5 x 12 Quantum apex balloon which then taken to 6 or 8 atmospheres  throughout the stent.  There was still some considerable stent mal-  expansion noted.  Pre-dilatation was then down in the mid lesion and  then the mid lesion was stented with a 23 x 2.75 Xience V drug-eluting  platform.  This was then postdilated using a 20 x 3.0 Quantum apex  balloon throughout the course of the stent.  Followed views were  excellent.  Because of mild stent mal-expansion of the distal stent, we  then put a 2.25 down into the distal vessel with an 8-mm length balloon.  High-pressure inflations up to 15 atmospheres were then performed in  this area, and this led to really excellent stent expansion.  Final  angiographic views demonstrated to patent stents, with diffuse luminal  irregularities distally is noted on the previous  diagnostic study.  There were no major complications.  Femoral sheath was sewn into place.  She was taken to the holding area in satisfactory clinical condition.   ANGIOGRAPHIC DATA:  1. The right coronary artery is previously stented vessel.  In the      proximal stent near the proximal mid junction, there is 30-40%      diffuse luminal narrowing.  Distal to this, there is an area of      segmental plaque with up to 80-90% narrowing, best seen in the      previous study on the RAO views.  This area was stented with      reduction from 80-90 down to 0%.  The distal edge of the stent was      beyond the previously placed stent in the mid distal vessel.  The      mid distal stent is widely patent, and the area proximal to the PDA      takeoff demonstrates 70-80% narrowing.  Following stenting, this is      reduced to 0%.  The PDA continues to remain widely patent.  There      are several lesions in the  posterolateral system being about 70% as      previously noted and this is a relatively small caliber vessel of      moderate size.  Please see the diagnostic study.  2. The left coronary artery is similar to the previous study.  The LAD      has moderate plaque in the midvessel of approximately 40%.  The      ramus intermedius was subtotally occluded on the previous study.      The current stent in the ostium is widely patent with the distal      changes unchanged.   CONCLUSION:  1. Continued patency of the stent to the ramus intermedius.  2. Successful stenting of the right coronary artery in 2 locations.   DISPOSITION:  The patient will be treated medically.  Enrollment in DES  will be suggested.      Loretha Brasil. Lia Foyer, MD, [facility]  Electronically Signed     TDS/MEDQ  D:  09/30/2008  T:  10/01/2008  Job:  [id number]

## 2010-08-28 NOTE — Assessment & Plan Note (Signed)
[facility] OFFICE NOTE   DEIONDRA, WOHLER                       MRN:          [id number]  DATE:07/10/2007                            DOB:          06-19-1925    PRIMARY CARE PHYSICIAN:  Dr. Judithe Modest.   REASON FOR VISIT:  Cardiac followup.   HISTORY OF PRESENT ILLNESS:  Ms. Salatino comes back in for an annual  visit.  She was previously followed by Dr. Albertine Patricia and has history of  coronary artery disease status post stent placement to the right  coronary artery and posterior descending branch of the right coronary  artery back in 2003.  Her last Myoview was in 2006 and demonstrated no  frank ischemia with an ejection fraction of 65%.  She is not reporting  any significant angina and no progressive dyspnea on exertion.  She has  also been previously diagnosed with peripheral arterial disease with a  long segment of bilateral superficial femoral artery occlusion.  She is  not describing any resting leg pain or, for that matter, any progressive  claudication which she indicates is mild at most.  She has been  compliant with medications although does state that she did not tolerate  Vytorin due to muscle aches and is now taking Zetia daily without any  problems.  Her electrocardiogram shows sinus rhythm with leftward axis  and decreased R wave progression.  These changes are old in comparison  to her prior tracing from last year.   ALLERGIES:  BENADRYL, PENICILLIN.   PRESENT MEDICATIONS:  1. Enteric-coated aspirin 81 mg p.o. daily.  2. Toprol XL 50 mg p.o. daily.  3. Multivitamin daily.  4. Co-Q-10 100 mg p.o. daily.  5. Calcium 600 mg p.o. daily.  6. B vitamin.  7. Ramipril 10 mg p.o. daily.  8. Zetia 10 mg 1/2 tablet p.o. daily.  9. Nitroglycerin 0.4 mg p.r.n.   REVIEW OF SYSTEMS:  As described in present illness.  Otherwise  negative.   EXAMINATION:  Blood pressure is 120/76, heart rate is 73  weight is 124  pounds.  The patient is comfortable normally nourished no acute distress.  Examination neck reveals no elevated was pressure no loud bruits or  thyromegaly.  Lungs are clear.  Cardiac a regular rate and rhythm.  No S3 gallop. Soft systolic murmur  second heart sound is normal.  ABDOMEN:  Soft, nontender.  No bruits.  EXTREMITIES:  Exhibit diminished distal pulses are mild lower-extremity  edema, left greater than right, which is chronic per patient report.  SKIN:  Warm and dry.  MUSCULOSKELETAL:  No kyphosis.  Neuropsychiatric the patient alert x3.  Affect is normal.   IMPRESSION/RECOMMENDATIONS:  Coronary disease status post previous stent  placement to the right coronary artery and posterior descending branch  back in 2003.  She had a nonischemic Myoview in 2006 and is not  reporting any progressive symptoms since then.  Our plan will be to  arrange a 34-month followup to review her symptoms more closely and more  than likely in one year's time, repeat  a Myoview for basic surveillance.  I have made no specific medication changes as her regimen seems quite  reasonable.  From the perspective of her peripheral arterial disease,  she is also stable symptomatically.  If this progresses we can have her  see Dr. Burt Knack for a peripheral arterial evaluation.     Satira Sark, MD  Electronically Signed    SGM/MedQ  DD: 07/10/2007  DT: 07/11/2007  Job #: [id number]   cc:   Judithe Modest, M.D.

## 2010-08-31 NOTE — Discharge Summary (Signed)
NAME:  [name], [name]                          ACCOUNT NO.:  [id number]   MEDICAL RECORD NO.:  [id number]                   PATIENT TYPE:  INP   LOCATION:  3713                                 FACILITY:  [facility]   PHYSICIAN:  Vanna Scotland. Olevia Perches, M.D. [facility]           DATE OF BIRTH:  1925/06/23   DATE OF ADMISSION:  01/11/2002  DATE OF DISCHARGE:  01/13/2002                           DISCHARGE SUMMARY - REFERRING   PROCEDURES:  1. Cardiac catheterization.  2. Coronary arteriogram.  3. Left ventriculogram.  4. Right femoral ultrasound.   HOSPITAL COURSE:  The patient is a 77 year old female with a history of  coronary artery disease.  She came to the emergency room on 01/11/2002  complaining of bilateral hand pain.  The symptoms were associated with an  uncomfortable feeling in her chest, and she had two episodes, each relieved  with sublingual nitroglycerin x 1.  There was concern that she had recurrent  angina secondary to coronary artery disease, and she was admitted for  further evaluation and cardiac catheterization.   Her enzymes were negative for MI, and she had a cardiac catheterization on  01/12/2002.  The cardiac catheterization showed approximately a 20% stenosis  in the distal portion of the left main.  The proximal LAD had a 20%  stenosis, and a branch of the circumflex had a 60% stenosis and then a 90%  stenosis.  Stents in the RCA system were patent, and there was 20% lesion in  the mid portion.  Her EF and any possible MR were not determined secondary  to ventricular tachycardia,but there was no aortic stenosis on pullback.  The films were reviewed by Dr. Albertine Patricia, and he felt that she had stable small  vessel disease and suspected that the bilateral hand pain was noncardiac.   The next day she was stable after catheterization, but a right groin bruit  was noted.  Because of the possibility of pseudoaneurysm or A-V fistula, she  had a groin ultrasound performed.  There was no  evidence of pseudoaneurysm  or A-V fistula.   The patient was ambulating without chest pain and considered stable for  discharge on 01/13/2002.   LABORATORY DATA:  Hemoglobin 13.0, hematocrit 38.9, WBC 5.8, platelets 163.  Sodium139, potassium 4.0, chloride 109, CO2 26, BUN 11, creatinine 0.7,  glucose 88, calcium 10.5.  TSH 0.518.  Urinalysis was negative for nitrites  but positive for leukocytes and additionally positive for many squamous  epithelial cells.  The patient had no dysuria, no fever, and a normal white  count.  Because of this, it was felt that this was not a clean catch, and no  further workup is indicated at this time.  If she becomes symptomatic or  begins running a fever, she is to follow up with her primary care physician.  No bacteria were seen.   DISCHARGE DIAGNOSES:  1. Bilateral hand pain, not felt secondary to coronary  ischemia.  2. Preserved left ventricular function with ejection fraction of     approximately 70% by catheterization in July 2003.  3. History of coronary artery disease, status post percutaneous transluminal     coronary angioplasty and stent to the posterior descending artery in July     2003.  4. Status post percutaneous transluminal coronary angioplasty and stent x 2     to the right coronary artery in January 2003.  5. Hypercholesterolemia.  6. History of peripheral vascular disease, status post left femoral-     popliteal bypass graft.  7. History of allergy to BENADRYL, SEAFOOD, and PENICILLIN.  8. History of methicillin-susceptible Staphylococcus aureus neck abscess     with treating of the sternocleidomastoid muscle and sternoclavicular     joint infection in August 2003 with antibiotics continuing for six weeks.  9. History of methicillin-susceptible Staphylococcus aureus urinary tract     infection.  10.      Pneumonia with a small left pleural effusion by chest x-ray in July     2003.   DISCHARGE INSTRUCTIONS:  1. Her activity  level is to including no driving, sexual or strenuous     activity for two days.  2. She is to stick to a low-fat diet.  3. She is to call the office for bleeding, swelling, or drainage at the     catheterization site.  4. She is to follow up with Dr. Albertine Patricia and call for an appointment.  5. She is to follow up with Dr. Derrel Nip and Dr. Rosaura Carpenter as needed.   DISCHARGE MEDICATIONS:  1. Coated aspirin 81 mg q.d.  2. Pravachol 80 mg q.d.  3. Toprol XL 50 mg q.d.  4. Altace 5 mg q.d.  5. Nitroglycerin p.r.n.  6. Zetia 10 mg q.d.     Davis Gourd, P.A. [facility]                  Bruce R. Olevia Perches, M.D. [facility]    RG/MEDQ  D:  01/13/2002  T:  01/16/2002  Job:  [id number]   cc:   Judithe Modest, M.D.  [address]  [place], [place] [address]  Fax: [phone]   Deborra Medina, M.D.  [facility].  [place], [place] [address]  Fax: [phone]   Ethelle Lyon, M.D. [place]  [place], [place] [address]  Fax: [phone]

## 2010-08-31 NOTE — H&P (Signed)
[place]. [facility]  Patient:    [name], [name] Visit Number: [id number] MRN: [id number]          Service Type: MED Location: [id number] Attending Physician:  Daniel Nones. Dictated by:   Marijo Conception Wall, M.D. [facility] Admit Date:  10/25/2001   CC:         Dr. Irineo Axon, M.D.  Allene Dillon, M.D. [facility]   History and Physical  CHIEF COMPLAINT:  Chest pain "just like my chest pain prior to my previous heart problem."  HISTORY OF PRESENT ILLNESS:  The patient is a 77 year old black female that had percutaneous coronary intervention of the right coronary artery in January of this year.  At about 8 a.m. this morning, she began having chest pain consistent with previous angina at rest.  It was relieved with nitroglycerin after several minutes.  She had no evidence of associated symptoms.  She has significant peripheral vascular disease and her exercise capacity has been limited by claudication.  She was admitted through the emergency room with rule out MI and repeat catheterization.  ALLERGIES:  She is allergic to CONTRAST, PENICILLIN.  CURRENT MEDICATIONS: 1. Imdur 60 mg q.d. 2. Nitroglycerin p.r.n. 3. Toprol XL 50 mg q.d. 4. Pravastatin 80 mg q.h.s. 5. Altace 5 mg q.d. 6. Aspirin 81 mg q.d.  Of note, she did not take aspirin for the past    three weeks because of dyspepsia.  PAST MEDICAL HISTORY:  Her previous catheterization showed an ejection fraction of 60%.  She has a 20% left main, 30% mid-LAD, 60% proximal circumflex, 60-70% of the ramus intermedius, and 90% of the small branch, and 75% proximal right coronary artery that was opened to a 0% stenosis.  There was a 75% acute marginal of the right coronary artery that was opened to a 0% stenosis. Peripheral vascular disease, status post left lower extremity bypass in 1995.  She has a 40% left renal artery stenosis.  She has hypertension.  SOCIAL HISTORY:  She lives in  [place].  She does not drink.  She does not smoke.  FAMILY HISTORY:  Father died of MI at age 25.  Mother died of a stroke at age 25.  REVIEW OF SYSTEMS:  Otherwise unremarkable.  PHYSICAL EXAMINATION:  VITAL SIGNS:  Blood pressure 153/75, respiratory rate 18, pulse 66 and regular, temperature 97.2.  She weighs 140 pounds.  O2 saturation was 97% on room air.  GENERAL:  She was in no acute distress.  She is very pleasant.  NEUROLOGICAL:  Grossly intact.  HEENT:  Normocephalic and atraumatic.  Pupils are equal, round and reactive to light and accommodation.  Extraocular movements were intact.  Sclerae were clear.  NECK:  No JVD.  Carotid upstrokes are equal bilaterally without bruits.  There is no obvious adenopathy.  CARDIOVASCULAR:  Regular rate and rhythm without murmur, rub, or gallop.  LUNGS:  Clear to auscultation and percussion.  SKIN:  No lesions or rashes.  ABDOMEN:  Good bowel sounds with no hepatosplenomegaly.  RECTAL:  Guaiac negative.  EXTREMITIES:  Right femoral bruits and a left femoral pulse is not palpable.  LABORATORY DATA:  Chest x-ray shows no acute cardiopulmonary disease.  EKG shows a first degree AV block with no ischemic changes.  Rate of 71.  Laboratory data was unremarkable except for a CPK of 290 with a MB of 6.2. Troponin has been negative.  Coagulation studies are normal.  ASSESSMENT: 1. Acute coronary syndrome, status post  percutaneous coronary intervention    of the right coronary artery x 2 in January 2003.  The patient has residual    disease.  She has normal left ventricular function. 2. Peripheral vascular disease. 3. Hyperlipidemia on Pravastatin. 4. History of hypertension. 5. Contrast allergy. 6. Recent dyspepsia with aspirin.  PLAN:  Admit to telemetry.  Treat with aspirin, beta blocker, ACE, nitroglycerin.  She will have IV heparin and IV nitroglycerin specifically. Cycle enzymes.  Catheterization Tuesday for possible  reintervention.  Continue Pravastatin.  Cover with proton pump inhibitor.  Contrast dye prophylaxis. Access through the right femoral, left femoral is not palpable.Dictated by: Marijo Conception Wall, M.D. [place] Attending Physician:  Daniel Nones DD:  10/26/01 TD:  10/27/01 Job: [id number]

## 2010-08-31 NOTE — Discharge Summary (Signed)
NAMEMARLAINE, [name]                ACCOUNT NO.:  [id number]   MEDICAL RECORD NO.:  [id number]          PATIENT TYPE:  INP   LOCATION:  3013                         FACILITY:  [facility]   PHYSICIAN:  Ashok Pall, M.D.     DATE OF BIRTH:  [birth year]   DATE OF ADMISSION:  07/04/2004  DATE OF DISCHARGE:                                 DISCHARGE SUMMARY   ADMITTING DIAGNOSIS:  Lumbar stenosis.   DISCHARGE DIAGNOSIS:  Lumbar stenosis.   PROCEDURES:  Posterior lumbar decompression at L2-L5.   COMPLICATIONS:  None.   DISCHARGE STATUS:  Alive and well.   INDICATIONS:  [name] is a 77 year old woman who presented with lumbar  stenosis at L2-3, L3-4, and L4-5.  She had classic symptoms of neurogenic  claudication.  MRI showed profound lumbar stenosis.   She was admitted and taken to the operating room, where she underwent an  uncomplicated complete laminectomy of L3 and L4 and a hemilaminectomy of L2  and L5.  She postoperatively had far less pain in the lower extremities and  did not have excessive pain in the back.  However, she felt ill during much  of her hospitalization though she was able to tolerate a regular diet and  had no other problems.  She had no nausea and no vomiting.  She, however,  was ready to leave on 07/10/04.  At discharge, the wound was clean and dry  without signs of infection.  Normal strength in the lower extremities.  She  had been evaluated thoroughly by physical therapy and felt to be able to  leave without any problems.   MEDICATIONS AT DISCHARGE:  Tylenol No. 3.  She was given instructions of no  lifting, bending, or twisting.  She was told not to drive until the  beginning of next week.  She was also given instructions to call the office  for a return appointment in approximately four weeks for followup.      KC/MEDQ  D:  07/10/2004  T:  07/10/2004  Job:  [id number]

## 2010-08-31 NOTE — Cardiovascular Report (Signed)
[place]. [facility]  Patient:    [name], [name] Visit Number: [id number] MRN: [id number]          Service Type: CAT Location: [id number] Attending Physician:  [name] Dictated by:   [name], M.D. [facility] Proc. Date: 05/12/01 Admit Date:  05/12/2001 Discharge Date: 05/13/2001   CC:         [name]; [place], [place]             Cardiac Catheterization Lab                        Cardiac Catheterization  PROCEDURES PERFORMED: 1. Left heart catheterization. 2. Coronary angiography. 3. Left ventriculography. 4. Abdominal aortography. 5. PTCA with stent placement in the distal right coronary artery. 6. PTCA with stent placement in the proximal right coronary artery.  INDICATIONS:  [name] is a 77 year old woman with a history of severe hypercholesterolemia and peripheral vascular disease.  She presented to [name] with recent onset of exertional angina, consisting of a burning in the chest and left arm weakness, associated with exertion and relieved with rest.  She was subsequently referred for cardiac catheterization.  CATHETERIZATION PROCEDURE NOTE:  A 6-French sheath was placed in the right femoral artery.  Standard Judkins 6-French catheters were utilized.  CONTRAST:  Omnipaque.  COMPLICATIONS:  None.  CATHETERIZATION RESULTS:  HEMODYNAMICS: 1. Left ventricular pressure:  124/20. 2. Aortic pressure:  150/74. There is no aortic valve gradient.  LEFT VENTRICULOGRAM:  Wall motion is normal.  Ejection fraction estimated at greater than 60%.  There is some mitral regurgitation seen; however, this is secondary to ventricular ectopy.  ABDOMINAL AORTOGRAM:  Reveals a 40% stenosis in the left renal artery.  The right renal artery is patent.  There is mild, diffuse atherosclerotic disease of the abdominal aorta and iliac arteries.  CORONARY ARTERIOGRAPHY:  (Right dominant) 1. LEFT MAIN:  Has a distal 20% stenosis. 2. LEFT  ANTERIOR DESCENDING ARTERY:  Has a diffuse 30% stenosis in the mid    vessel.  The LAD gives rise to a small first diagonal, small second    diagonal and a normal-sized third diagonal. 3. LEFT CIRCUMFLEX ARTERY:  Has a 60% stenosis in the proximal vessel.    The circumflex gives rise to a very large branching ramus intermediate.    The ramus intermediate has a 60-70% stenosis in the more medial of the    two major branches, and a 40% stenosis in the more lateral of the two    major branches.  Further down in a smaller subbranch of the medial branch    of the ramus there is a 90% stenosis right at the bifurcation point of    this branch.  The vessel is relatively small at that point.  The circumflex    then continues to give rise to a small first obtuse marginal branch and a    small second obtuse marginal branch. 4. RIGHT CORONARY ARTERY:  A dominant vessel.  In the proximal vessel there is    a 75% stenosis with a shaggy border, consistent with a probable recent    ruptured plaque.  In the mid vessel is a 40% stenosis.  In the mid to    distal vessel at the acute margin is a 75% stenosis.  Further down in the    mid vessel is a 40% stenosis, and in the AV groove portion of the  right coronary artery is a 40% stenosis.  The distal right coronary artery    gives rise to a normal to large posterior descending artery, which has a    30% stenosis at its origin; and a small first posterolateral branch and    a small second posterolateral branch.  IMPRESSIONS: 1. Normal left ventricular systolic function. 2. Two-vessel coronary artery disease, as described.  The culprit vessel    appears to be the right coronary artery, particularly the proximal    vessel (which has the appearance of a ruptured plaque).  There is also    moderate disease in the ramus intermediate branch of the circumflex, with    significant disease in a smaller subbranch.  PLAN:  Percutaneous intervention of the right  coronary artery (SEE BELOW).  PERCUTANEOUS INTERVENTION PROCEDURAL NOTE:  Following completion of the diagnostic catheterization, we opted to proceed with percutaneous coronary intervention.  The 6-French sheath in the right femoral artery was exchanged over a wire for a 7-French sheath.  Heparin and Integrilin were administered per protocol.  We used a 7-French JR4 guiding catheter with sideholes, and a BMW wire.  We initially deployed a 2.5 x 12 mm XPress stent across the lesion in the distal vessel at the acute margin of the right coronary artery.  The stent was deployed at 12 atm.  We then deployed a 2.75 x 18 mm XPress II stent in the proximal vessel, and did a deployment pressure of 9 atm.  Following this, we post-dilated the stent in the distal vessel with a 2.75 x 8 mm Quantum balloon, which was inflated to 12 and then 16 atm in the distal aspect of the stent; then 18 atm in the proximal aspect of the stent. We then post-dilated the proximal stent with the 2.75 x 8 mm Quantum balloon, inflating it to 16 atm in the distal aspect of the stent, and 20 atm in the proximal aspect of the stent.  Final angiographic images revealed patency of the right coronary artery, with less than 10% residual stenosis in the distal vessel and 0% residual stenosis in the proximal vessel and TIMI-3 flow.  COMPLICATIONS:  None.  RESULTS: 1. Successful PTCA with stent placement in the distal right coronary artery;    reducing a 75% stenosis to 0% residual with TIMI-3 flow. 2. Successful PTCA with stent placement in the proximal right coronary artery;    reducing a 75% stenosis with the appearance of a ruptured plaque to 0%    residual flow.  PLAN:  Integrilin will be continued for 18 hr.  Plavix will be administered for four weeks.  The patient needs aggressive risk factor modification, including aggressive treatment of her hypercholesterolemia and continued anti-anginal therapy for her residual  coronary artery disease.    Dictated by: [name], M.D. [place]  Attending Physician:  [name] DD:  05/12/01 TD:  05/13/01 Job: [id number]

## 2010-08-31 NOTE — Discharge Summary (Signed)
NAMEKELITA, BECHTEL                            ACCOUNT NO.:  [id number]   MEDICAL RECORD NO.:  [id number]                   PATIENT TYPE:  INP   LOCATION:  [id number]                                 FACILITY:  [facility]   PHYSICIAN:  Koralee Wedeking DICTATOR                    DATE OF BIRTH:  12-07-1925   DATE OF ADMISSION:  11/01/2001  DATE OF DISCHARGE:  11/18/2001                                 DISCHARGE SUMMARY   DISCHARGE DIAGNOSES:  1. Methicillin-susceptible Staphylococcus aureus neck abscess with seeding     of the sternocleidomastoid muscle and sternoclavicular joint infection.   DISCHARGE MEDICATIONS:  1. Pravachol 80 mg p.o. q.18h.  2. Ramipril 5 mg q.d.  3. Toprol XL 50 mg p.o. q.d.  4. Aspirin 325 mg q.d. p.o.  5. IV cefazolin in D5W- 1 gm in 50 ml/D5W q.8h.  6. Darvocet-N 100/650 mg p.o. q.4h. p.r.n.  7. Vicodin 5/500 mg 1 p.o. q.4h. p.r.n.  8. Tylenol 650 mg p.o. q.6h. p.r.n.   DISPOSITION:  Patient being sent to a skilled nursing facility for  antibiotic treatment IV for four to six weeks, that is with cefazolin IV 1  gm q.8h.   PROCEDURE PERFORMED:  Incision and drainage of a neck mass abscess.   CONSULTATIONS:  1. Infectious Disease.  2. Cardiology.  3. Surgery.   HISTORY AND PHYSICAL:  The patient is a 77 year old African-American female  with a past medical history of coronary artery disease and hypertension.  She had a cardiac catheterization in October 27, 2001 with a balloon  angioplasty at the posterior descending artery by Dr. Albertine Patricia.  Several days  after that she started having fever and right neck and shoulder pain.  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  She is presently on cefazolin 1 gm  q.8h and she will continue the same for four to six weeks.  She is on day 10  of her antibiotics.  The abscess which was  formed was drained by Dr.  Lucia Gaskins, surgery.  During her hospitalization,  coronary artery disease,  hypertension were stable.   LABORATORY AND ACCESSORY DATA:  Admission labs, she had fever of 101.4, she  had tachycardia, leukocytosis.  WBCs 13.6.  She had elevated ESR of 100.  Her urinalysis suggested UTI and positive nitrites, 10 to 20 WBCs.   Chest x-ray showed new infiltrate with small left pleural effusion.  Blood  cultures showed GBC in clusters. EKG showed sinus tachycardia.   Discharge Labs:  Sodium 135, potassium 4.4, chloride 102, CO2 27, BUN 9,  creatinine 0.7.  Her blood glucoses was 113.  Her hemoglobin was 10 and  subsequently 11.3.  Her white blood cell count was 10 and her platelet count  was 362,000.   PHYSICAL  EXAMINATION:  HEENT:  Examination showed right anterior neck  tenderness with no palpable mass when she came in.  There was also right  upper parasternal tenderness.  LUNGS:  Her lung examination showed bilateral rales.  EXTREMITIES:  She had no calf tenderness.   HOSPITAL COURSE:  #1 - ABSCESS.  Her neck abscess was found to be  Methicillin-susceptible Staphylococcus aureus.  She was started on  antibiotics.  During her stay her abscess was drained and a follow-up visit  will be scheduled in Dr. Pollie Friar office.  #2 - CORONARY ARTERY DISEASE. It was documented with recent catheterization.  Two stents were on right coronary artery and a diffuse stenosis in the chest  x-ray territory was seen.  Stenosis was also seen in the posterior  descending artery.  #3 - HYPERTENSION.  Not a problem during hospital stay. She will be sent  home on ________ 5 mg q.d. and Toprol XL 50 mg p.o. q.d.                                                Kayti Poss DICTATOR    DD/MEDQ  D:  11/17/2001  T:  11/17/2001  Job:  [id number]   cc:   Infectious Disease Svc   Fenton Malling. Lucia Gaskins, M.D.  Fax: [place], [place] [facility]  [address] [place],  [place] [address]  Fax: [phone]   Evette Doffing, M.D.   Arelia Longest. Michelle Nasuti., M.D.

## 2010-08-31 NOTE — Cardiovascular Report (Signed)
[place]. [facility]  Patient:    [name], [name] Visit Number: [id number] MRN: [id number]          Service Type: EMS Location: [facility] Attending Physician:  Lebron Conners Dictated by:   Ethelle Lyon, M.D., [place]. Date: 10/27/01 Admit Date:  10/30/2001   CC:         Allene Dillon, M.D. [facility]  Franklyn Lor, M.D.  Wallis Bamberg. Johnsie Cancel, M.D. [facility] (diagnostic proctor)  Loretha Brasil. Lia Foyer, M.D. [facility] (PCI proctor)   Cardiac Catheterization  PROCEDURES: Coronary angiography, left heart catheterization, left ventriculogram.  INDICATION: Unstable angina.  DIAGNOSTIC TECHNIQUE: A 6 French sheath was placed in the right femoral artery using modified Seldinger technique under 2% lidocaine local anesthetic. Catheters were advanced over a wire. A 6 [place] JR4 catheter was used to engage the native right coronary and a 6 [place] JL4 catheter was used to engage the native left coronary. Coronary angiography was performed by hand injection. A 6 French pigtail catheter was advanced into the left ventricle. Left heart pressures were measured. Ventriculography was performed from the RAO injection by a power injection of contrast.  FINDINGS: 1. RCA: Widely patent stents in the proximal and mid RCA. There is a new    focal 90% stenosis in the midportion of the ______ small PDA. 2. Left main: A 20% distal left main stenosis unchanged from prior. 3. LAD: The proximal LAD has diffuse mild disease with up to 20%    stenosis. 4. Circumflex: There is a very large first obtuse marginal branch.    There are two major branches at this marginal. The more medial of the    branches has a 60% stenosis just after its bifurcation. A smaller    branch of this medial division has a 90% ostial stenosis which is unchanged    from her prior study.  LEFT VENTRICULOGRAPHY: There is no mitral regurgitation. Ejection fraction of 70% without regional wall motion abnormality. LVEDP 22 after  the administration of contrast. Aortic pressure 158/83. There is no aortic stenosis.  IMPRESSIONS: 1. Normal left ventricular systolic function without mitral regurgitation or    aortic stenosis. 2. Widely patent right coronary artery stents. 3. Stable small vessel disease in the circumflex distribution. 4. New 90% stenosis in the posterior descending artery.  PLAN: Proceed to percutaneous intervention of the PDA.  PROCEDURE PERFORMED: Balloon angioplasty of the PDA.  INDICATION: Unstable angina.  COMPLICATIONS: None.  INTERVENTIONAL TECHNIQUE: Via the preexisting 6 French right femoral artery sheath, a 6 [place] guiding catheter was advanced over a wire and engaged in the ostium of the right coronary artery. Angiography was performed by hand injection. A Hi-Torque Floppy wire was advanced into the distal PDA. A 2.0 x 9 mm Maverick was positioned across the lesion and inflated once to 8 atmospheres for 60 seconds. This resulted in no residual stenosis with TIMI-3 flow to the distal vessel.  IMPRESSION: Successful balloon angioplasty of the small posterior descending artery, resulting in no residual stenosis and TIMI-3 flow.  PLAN: Continue Integrilin for 18 more hours. Continue aspirin indefinitely. Continue antianginal medical therapy for her small vessel disease in the circumflex distribution. Dictated by:   Ethelle Lyon, M.D., [facility] Attending Physician:  Lebron Conners DD:  10/27/01 TD:  10/30/01 Job: [id number]

## 2010-08-31 NOTE — Consult Note (Signed)
[place]. [facility]  Patient:    [name], [name] Visit Number: [id number] MRN: [id number]          Service Type: MED Location: [id number] Attending Physician:  Axel Filler Dictated by:   Fenton Malling. Lucia Gaskins, M.D. Proc. Date: 11/04/01 Admit Date:  11/01/2001   CC:         Loretha Brasil. Lia Foyer, M.D. [facility]  Amedeo Gory, M.D., [place], [facility]  Axel Filler, M.D.  Raelene Bott, M.D., [facility] Teaching [facility]   Consultation Report  DATE OF BIRTH:  November 16, 1925  HISTORY OF PRESENT ILLNESS:  [name] is a 77 year old black female who identifies Dr. Doristine Johns as her primary doctor in [place], [place].  She was hospitalized on 27 October 2001 for cardiac catheterization by Dr. Albertine Patricia which showed some previously implanted stents and a new stenosis of her posterior descending artery.  She did well, went home, was readmitted.  She came to the [facility] Emergency Room on 18 July for some shoulder pain which got better after receiving Vioxx.  She went home and then represented of 20 July with shortness of breath, palpitations.  Since admission, she has turned out to have a swelling of her right neck which has been tender.  CT scan done yesterday shows a complex process at the inferior aspect of the right sternocleidomastoid muscle surrounding her right sternoclavicular joint which was felt to be infectious versus neoplastic.  I was asked to see her for this abscess/inflammatory area.  The patient denies any history of trouble swallowing, any recent trauma to her head or neck or difficulty with any esophageal or stomach problems.  ALLERGIES:  She is allergic to PENICILLIN which leads to a rash.  CURRENT MEDICATIONS ON ADMISSION: 1. Altace 10 mg daily. 2. Aspirin 325 mg daily. 3. Pravachol 80 mg daily. 4. Plavix 75 mg daily. 5. Toprol XL 100 mg daily. 6. Imdur 60 mg daily. 7. Nitroglycerin sublingually p.r.n. 8. Vioxx 25 mg  daily.  REVIEW OF SYSTEMS:  Neurologic: History of seizure, a loss of consciousness. Pulmonary: She was admitted and told she has pneumonia on this admission, and she has never had pulmonary problems before.  Cardiac: It appears she had a heart catheterization in January in which two stents were placed.  She thinks Dr. Vicenta Aly was her cardiologist at that time.  Again then she had this most recent catheterization.  She has chronic hypertension.  She has also had a left leg bypass for peripheral vascular disease, she thinks in the year 2000 by Dr. Kellie Simmering at [facility].  Gastrointestinal: She had an appendectomy in her 75s, but she has had no history of peptic ulcer disease, liver disease, pancreatic disease, or change in bowel habits.  Urologic: No history of kidney stones or kidney infections.  SOCIAL HISTORY:  She lives with her daughter.  When I examined her, she was alone in her room.  PHYSICAL EXAMINATION:  VITAL SIGNS:  Temperature 99.9, blood pressure 140/70, pulse 100.  GENERAL:  She is a well-nourished, older, black female who has a clear voice, talks, alert.  HEENT:  Unremarkable.  NECK:  At the base of the right neck, she has about a 4 to 5 cm indurated and sort of inflamed area which is minimally tender but not all that severe.  She has no trouble swallowing.  LUNGS:  Clear to auscultation.  HEART:  Regular rate and rhythm without murmur or rub.  BREASTS:  Symmetrical.  I feel no palpable mass in her breasts.  She has never had a mammogram.  ABDOMEN:  Soft.  No tenderness, guarding, or rebound.  EXTREMITIES:  Good strength in all four extremities.  DIAGNOSTIC DATA:  Her labs show a white blood count of 33,000, hemoglobin 11, hematocrit 32.  Sodium 138, potassium 3.9, chloride 110, CO2 21, glucose 147, BUN 23,  creatinine 0.9.  Serum calcium is 11.  Her serum parathyroid level is 95 which is only mildly elevated.  IMPRESSION: 1. Right neck mass.   Will review CT scans.  The oxygen will be continued.    Medical therapy versus percutaneous drainage versus open drainage of this    neck access. 2. Coronary artery disease with recent cardiac catheterization. 3. Longstanding hypertension. 4. Hypercalcemia; source is really not clear at this time, a reason is    not really clear at this time. 5. Staphylococcus sepsis. 6. Urinary tract infection with Staphylococcus. Dictated by:   Fenton Malling. Lucia Gaskins, M.D. Attending Physician:  Axel Filler DD:  11/04/01 TD:  11/04/01 Job: [id number]

## 2010-08-31 NOTE — Op Note (Signed)
[name], [name]                ACCOUNT NO.:  [id number]   MEDICAL RECORD NO.:  [id number]          PATIENT TYPE:  INP   LOCATION:  3172                         FACILITY:  [place]   PHYSICIAN:  Ashok Pall, M.D.     DATE OF BIRTH:  08/12/1925   DATE OF PROCEDURE:  07/04/2004  DATE OF DISCHARGE:                                 OPERATIVE REPORT   PREOPERATIVE DIAGNOSIS:  Lumbar stenosis L2-L5.   POSTOPERATIVE DIAGNOSES:  1.  Lumbar stenosis L2-L5.  2.  Spondylosis L2-L5.  3.  Degenerative disc disease L2-L5.   PROCEDURE:  L3, L4 laminectomies, L2 hemilaminectomies, L5 hemilaminectomies  for decompression of L2-3, L3-4, L4-5.   COMPLICATIONS:  None.   ANESTHESIA:  General endotracheal.   SURGEON:  Ashok Pall, M.D.   ASSISTANT:  Elizabeth Sauer, M.D.   INDICATIONS:  [name] is a 77 year old woman with profound lumbar  stenosis that is evidence on MRI and evidence of neurogenic claudication.  I  therefore recommended and she agreed to undergo operative decompression.   OPERATIVE NOTE:  Mrs.  Hynek was brought to the operating room intubated  and placed under a general anesthetic without difficulty.  She was rolled  prone on a Wilson frame, and all pressure points were properly padded.  Her  back was prepped, and she was draped in a sterile fashion.  Using a  preoperative localizing film, I infiltrated 20 cc of 0.5% lidocaine with  1:200,000 strength epinephrine into the paraspinous musculature underneath  my incision site.  I opened with skin with a #10 blade and took this down to  the thoracolumbar fascia sharply.  I then exposed the lamina of L2, L3, and  L4.  I checked  another intraoperative x-ray which showed that I was at the  correct location.  I then proceeded with laminectomies of L3 and L4,  hemilaminectomies of L5 and of L2.  Decompression was performed using  [place] rongeur, Kerrison punches, and a high-speed drill.  I was able to  dissect underneath the  ligament using a Technical brewer at each level to  make sure that I would not catch the dura causing a spinal fluid leak.  I  was able to do this successfully.  Dr. Carloyn Manner helped with the decompression.  Hemostasis was obtained.  I then irrigated the wound.  The wound was then  closed in a layered fashion using Vicryl sutures.  Dermabond used for a  sterile dressing.    KC/MEDQ  D:  07/04/2004  T:  07/05/2004  Job:  [id number]

## 2010-08-31 NOTE — Cardiovascular Report (Signed)
NAME:  [name], [name]                          ACCOUNT NO.:  [id number]   MEDICAL RECORD NO.:  [id number]                   PATIENT TYPE:  INP   LOCATION:  [id number]                                 FACILITY:  [place]   PHYSICIAN:  Ethelle Lyon, M.D. [facility]         DATE OF BIRTH:  1925/11/06   DATE OF PROCEDURE:  01/12/2002  DATE OF DISCHARGE:  01/13/2002                              CARDIAC CATHETERIZATION   PROCEDURE:  Coronary angiography, left heart catheterization, left  ventriculography.   INDICATION:  The patient is a 77 year old lady, status post balloon  angioplasty of her posterior left ventricular branch of the right coronary  artery in July 2003.  She now returns with bilateral hand pain similar to  what had prompted her initial presentation.  Enzymes and electrocardiogram  have been without evidence of ischemia or infarction.  However, due to the  similarity of her pain to her prior pain and recent coronary intervention,  she was referred for diagnostic angiography.   DIAGNOSTIC TECHNIQUE:  Informed consent was obtained.  Under 2% lidocaine  local anesthesia, a 6-French sheath was placed in the right femoral artery.  Diagnostic angiography was performed using JL4 and JR4 catheters.  A pigtail  catheter was advanced in her left ventricle.  Pressures were measured.  Ventriculography was performed by power injection.  7Following the  procedure, the patient was transferred to the holding room in stable  condition.  Sheaths are to be removed there.   FINDINGS:  1. Left main:  There is a 20% stenosis of the distal portion of the left     main.  2. LAD:  There is a proximal 20% stenosis of the LAD.  3. Circumflex:  The circumflex gives rise to a single large branching obtuse     marginal.  The midsection of this vessel has a 60% stenosis and the     distal portion has a 90% stenosis.  Both of these are stable from her     study of July 2003.  4. RCA:  The RCA is a large  vessel.  The previously placed stents in the     proximal and mid-vessel are widely patent.  There is a 20% stenosis at     the distal margin of the more proximal stent.  The prior balloon     angioplasty site at the [facility] is widely patent without residual stenosis.  5. LV:  EF and MR are not interpretable due to ventricular ectopy during     injection.  6. No aortic stenosis on pullback.  7. LV pressures:  126/3/9 after contrast.   COMPLICATIONS:  None.   IMPRESSION/PLAN:  The patient has stable small vessel disease.  I suspect  that the bilateral hand pain was noncardiac in etiology.  We will therefore  plan discharge in the morning on medical therapy for her coronary disease.  Ethelle Lyon, M.D. [facility]   WED/MEDQ  D:  01/12/2002  T:  01/16/2002  Job:  [id number]   cc:   Rowe Robert.D.

## 2010-08-31 NOTE — H&P (Signed)
NAME:  [name], [name]                          ACCOUNT NO.:  [id number]   MEDICAL RECORD NO.:  [id number]                   PATIENT TYPE:  INP   LOCATION:  3713                                 FACILITY:  [place]   PHYSICIAN:  Vanna Scotland. Olevia Perches, M.D. [facility]           DATE OF BIRTH:  07-Mar-1926   DATE OF ADMISSION:  01/11/2002  DATE OF DISCHARGE:                                HISTORY & PHYSICAL   PRIMARY CARE PHYSICIAN:  Franklyn Lor, M.D.   CARDIOLOGIST:  Ethelle Lyon, M.D. [facility].   CHIEF COMPLAINT:  Arm pain.   HISTORY OF PRESENT ILLNESS:  [name] is a very nice 77 year old woman who  came to the emergency room today with a chief complaint of bilateral arm  pain intermittently today.  She has documented coronary disease and had two  stents placed to the right coronary artery in January of 2003 by Dr.  Vicenta Aly, and then in July 2003, she had a stent placed in the posterior  descending branch of the right coronary artery by Dr. Albertine Patricia.  Her course  after that procedure was complicated by a Staph infection related to an  intravenous line which required four weeks of antibiotics.  Since that time,  she has done well until today when she developed two episodes of bilateral  arm pain extending up to her shoulders.  She felt like this was going into  her chest, but it never quite did.  She took nitroglycerin each time with  relief.  There was no associated diaphoresis or nausea.   PAST MEDICAL HISTORY:  Significant for peripheral vascular disease with a  prior left fem-pop bypass graft surgery.  She also has a history of the  Staph infection on her left admission.  She also has a history of  hypertension and hyperlipidemia.   ALLERGIES:  PENICILLIN which causes a rash, and she has a SHELLFISH allergy  which causes a rash.   MEDICATIONS:  Include aspirin 81 mg, Pravachol 80 mg, Toprol XL 50 mg, Imdur  60 mg, and Altace 5 mg.   SOCIAL HISTORY:  She lives with her daughter in  [place].  For details of  family history, social history, and review of systems, please see Cindy Landmark  [name]'s complete note.   PHYSICAL EXAMINATION:  Today, the blood pressure is 130/80, and the pulse is  70 and regular.  There was no venous distention.  The carotid pulses were  full, and I could hear no bruits.  CHEST:  Clear without rales or rhonchi.  The cardiac rhythm was regular, and  there were no murmurs.  There was a fourth heart sound.  ABDOMEN:  Soft without hepatosplenomegaly or pulsatile masses.  The bowel  sounds were normal.  EXTREMITIES:  There was a scar from her previous left fem-pop bypass graft  surgery.  The pedal pulses were equal, and there was no peripheral edema.  MUSCULOSKELETAL SYSTEM:  Showed no deformity.  SKIN:  Warm and dry.  NEUROLOGICAL EXAMINATION:  Nonfocal.   IMPRESSION:  1. Bilateral arm pain suggestive of unstable angina.  2. Coronary artery disease status post prior stenting x two of the right     coronary artery in January 2003 and status post stenting of the posterior     descending branch of the right coronary artery in July 2003.  3. Good left ventricular function.  4. Hypertension, treated.  5. Peripheral vascular disease status post left fem-pop bypass graft     surgery.  6. Hyperlipidemia.  7. Allergy to PENICILLIN.  8. Shellfish allergy.   RECOMMENDATIONS:  The patient's symptoms are suggestive of unstable angina.  We plan to admit her for observation and evaluation and treatment.  We will  start her on IV heparin in addition to her other medications and plan  evaluation with angiography tomorrow.                                               Bruce Alfonso Patten Olevia Perches, M.D. [facility]    BRB/MEDQ  D:  01/11/2002  T:  01/13/2002  Job:  [id number]

## 2010-08-31 NOTE — H&P (Signed)
[place]. [facility]  Patient:    Cindy, [name] Visit Number: [id number] MRN: [id number]          Service Type: MED Location: [id number] Attending Physician:  Daniel Nones. Dictated by:   Margy Clarks, M.D. Admit Date:  10/25/2001                           History and Physical  DATE OF BIRTH:  08-21-2025  PRIMARY CARE Avrie Kedzierski:  Dr. Mahala Menghini  PRIMARY CARDIOLOGIST:  Dr. Vicenta Aly  CHIEF COMPLAINT:  Chest pain.  HISTORY OF PRESENT ILLNESS:  [name] is a 77 year old black female with a history of coronary disease (PCI of her RCA in 1/03) who was in her usual state of health until today at approximately 8 a.m. when she began to have 5/10 substernal chest pain at rest.  She states this pain was similar in quality to the pain that proceeded her recent intervention.  This pain lasted approximately two minutes and was relieved with sublingual nitroglycerin.  She denied dyspnea, diaphoresis, nausea.  She had no radiation of her pain. She states that she had two more episodes of chest pain, one at 10 a.m. and one at 2 p.m. so she presented to the emergency department.  At baseline [name] has class I-II angina.  Her exercise capacity, however, is limited secondary to claudication.  She denies orthopnea, PND, palpitations or lower extremity edema.  PAST MEDICAL HISTORY: 1. Coronary artery disease.  A 05/12/01 catheterization revealed the following:    An ejection fraction of 60%.  She was right dominant.  The left main had a    20% distal lesion.  The LAD had a 30% mid lesion.  D1 and D2 were small.   D3 was normal size.  The circumflex had a 60% proximal lesion.  The ramus intermedius was large with a 60-70% lesion and a 90% lesion in the small    branch.  The first and second marginals were small.  The RCA had a 75% prox, 40% mid, 75% at the acute marginal and a 40% distal to that.  The PDA was normal to large in size and had a 30%  osteal lesion.  PL1 and PL2 were small.  The patient underwent percutaneous intervention of the two 75% RCA lesions with good result. 2. Peripheral vascular disease.  The patient is status post left lower    extremity bypass surgery.  She also has a 40% left renal artery stenosis that was identified on abdominal aortography in 1/03. 3. Hyperlipidemia. 4. Hypertension.  SOCIAL HISTORY:  She lives in [place], [place].  She never has been a cigarette smoker.  She denies alcohol use.  She does not exercise formally but does occasionally walk with symptoms of lower extremity claudication limiting her pace.  She follows a low saturated fat and cholesterol diet.  She denies over-the-counter medications.  FAMILY HISTORY:  Her mother died at age 50 of a stroke.  Her father died at age 72 of a stroke.  Her father died at age 7 of a myocardial infarction. She has a healthy sister, age 49.  MEDICATIONS ON ADMISSION: 1. Imdur 60 mg p.o. q.d. 2. Nitroglycerin sublingual p.r.n. which she has not used prior to today. 3. Toprol XL 50 mg p.o. q.d. 4. Pravastatin 80 mg p.o. q.d. 5. Altace 5 mg p.o. q.d. 6. Aspirin 81 mg p.o. q.d. (this has been held for the past  three weeks secondary to vague dyspepsia and lethargy).  ALLERGIES:  The patient reports allergies to PENICILLIN causing a rash and to SHELL FISH which causes a rash.  REVIEW OF SYSTEMS:  Significant only for occasional neck pain for the past two months.  She has had dyspepsia and general lethargy that has resolved within the past three weeks.  CODE STATUS:  The patient is a full code.  PHYSICAL EXAMINATION:  Temperature is 97.2 Fahrenheit, her blood pressure is 153/74, pulse 66, respiratory rate 18.  She is saturating 97% on room air. Her weight is 140 pounds and this is self reported.  GENERAL: She is a pleasant, elderly woman in no acute distress.  HEENT: Unremarkable.  NECK: Revealed no bruits or JVD.  I did not  appreciate thyromegaly.  CARDIOVASCULAR EXAM: Revealed a regular rate and rhythm.  The heart sounds were very distant but I did not appreciate any murmurs, rubs, or gallops.  LUNGS: Clear.  SKIN EXAM: Unremarkable.  ABDOMINAL EXAM: Revealed no organomegaly.  She had no palpable masses.  RECTAL EXAM: Revealed heme negative stool.  EXTREMITIES: She had a palpable right femoral pulse with a moderate bruit. The left femoral pulse was not palpable and no bruit was heard.  NEUROLOGIC EXAM: Grossly intact.  LABORATORY DATA:  The chest x-ray revealed no acute disease.  The EKG revealed normal sinus rhythm at a rate of 71.  There was a borderline first degree AV block and non-specific T-wave abnormalities but no obvious evidence of ischemia.  She had a CBC as follows: White count was 6.5, hemoglobin 13, hematocrit 38.9, platelets 160.  The Chem-7 revealed a sodium of 140, potassium of 4, chloride 108, bicarbonate 29, BUN 13, creatinine 0.8, glucose 109.  There is no GI panel at this time.  Her first set of cardiac markers revealed a CK of 290 and MB of 62 and a troponin I of 0.01.  Her PTT was 27, the PT was 13.2 and the INR was 1.0.  ASSESSMENT AND PLAN: 1. Acute coronary syndrome.  This patient is low to intermediate risk but will likely have significant coronary disease.  She received 325 mg of aspirin in the emergency department.  We will continue 160 mg p.o. q.d. and consider enteric coated formulation.  In addition we will cover her with a short-acting metoprolol with the hope to titrate her to a higher dose.  We will also increase her Altace to 10 mg p.o. q.d.  She was placed on sublingual nitroglycerin in the emergency department and will continue this for the time being.  She will likely be able to be transitioned to Nitropaste in the near future.  Heparin was started in the emergency department.  Because she has no high risk features on her EKG and her bile markers are negative  at this time, I will not add a glycoprotein 2B/3A inhibitor.  If she rules in or has  recurrent chest pain I will add this medication.  Regarding the intermediate plan, we will likely take the patient for a diagnostic heart catheterization in the morning.  Given her SHELL FISH allergy we will premedicate her with steroids.  2. Hyperlipidemia.  The patient is unaware of her most recent lipid panel. Given this complaint of general lethargy, I will decrease her dose of Pravastatin to 40 mg p.o. q.h.s.  LFTs are pending.  We will check a lipid panel in the morning and add a second agent if her LDL is greater than 100.  3.  Hypertension.  We have already increased her Ramipril to 10 mg.  She may need the addition of another agent such as hydrochlorothiazide.  4. Peripheral vascular disease.  The patient has had no worsening complaints but does have a significant limitation in her functional capacity.  This may be able to be worked up as an outpatient.  5. Dyspepsia.  I will place the patient on a proton pump inhibitor given her recent symptoms and the fact that she will receive anticoagulation during this stay. Dictated by:   Margy Clarks, M.D. Attending Physician:  Daniel Nones DD:  10/25/01 TD:  10/27/01 Job: [id number]

## 2010-08-31 NOTE — Discharge Summary (Signed)
[place]. [facility]  Patient:    [name] Visit Number: [id number] MRN: [id number]          Service Type: MED Location: [id number] Attending Physician:  Daniel Nones. Dictated by:   Sharyl Nimrod, P.A.-C. Admit Date:  10/25/2001 Disc. Date: 10/28/01   CC:         Dr. Doristine Johns   Referring Physician Discharge Marshall:  March 16, 1926  ADMITTING PHYSICIAN:  Dr. Verl Blalock.  DISCHARGING PHYSICIAN:  Dr. Vicenta Aly.  SUMMARY OF HISTORY:  [name] is a 77 year old black female, who at approximately 8 a.m. on the date of admission, was awakened with substernal chest discomfort which she gave a 5 on a scale of 0-10.  It lasted approximately two minutes and was relieved with nitroglycerin.  She did not have any associated shortness of breath, diaphoresis, or nausea.  Her exercise capacity is limited due to claudication.  She has never had any anginal symptoms at rest.  Her history is notable for peripheral vascular disease with lower extremity bypass grafting in 1995.  She has a 40% left renal artery stenosis, hyperlipidemia, hypertension, and known coronary artery disease. Her last catheterization was on May 12, 2001, which she underwent intervention to her proximal RCA and acute marginal branch.  LABORATORY DATA:  Admission H&H was 13.0 and 38.9, normal indices, platelets 160, WBC 6.5.  Subsequent labs were unremarkable.  It was noted that her platelets slightly decreased and were 142 on the 16th.  Admission PT was 13.2, PTT 27.  Sodium 140, potassium 4.0, BUN 13, creatinine .8, glucose 107. Calcium was noted to be elevated at 11.1.  Subsequent chemistries were unremarkable.  LFTs on the 14th did show a slight elevation of alkaline phosphatase at 129.  Her calcium was also 10.7.  CKs and troponins were negative for myocardial infarction.  Fasting lipids showed a total cholesterol of 229, triglycerides 51, HDL 71, LDL 148.  TSH  was slightly low at .118; however, her free T4 was within normal limits at 1.07.  PTH is pending at the time of this dictation.  Chest x-ray:  Possible left lower lobe atelectasis.  EKG showed normal sinus rhythm, delayed R wave, left axis deviation.  HOSPITAL COURSE:  [name] was admitted to 48.  Overnight, she did not have any further chest discomfort.  Enzymes and EKGs were negative for myocardial infarction.  She was continued on IV heparin.  October 27, 2001, Dr. Vicenta Aly reviewed.  She was started on Zetia for her hyperlipidemia that was not controlled with maximum Pravachol dosing.  On October 27, 2001, she underwent cardiac catheterization by Dr. Hoyle Barr.  According to his progress note, this showed an EF of 70% without wall motion abnormalities.  She had a 20% left main, 20% proximal LAD, 60-90% ramus.  The prior stent sites in the RCA were patent.  She had an 80% focal stenosis in the PDA.  Angioplasty was performed to the PDA, reducing this to 0%.  She was maintained on Integrilin for 18 hours.  Post sheath removal and bed rest, she was ambulating without difficulty.  On review on October 28, 2001, Dr. Vicenta Aly felt that she could be discharged home.  DISCHARGE DIAGNOSES: 1. Unstable angina, status post angioplasty to the posterior descending    artery, as previously described. 2. Hyperlipidemia. 3. Low TSH with a normal free T4. 4. Hypercalcemia. 5. History as previously.  DISPOSITION:  She is discharged home.  NEW PRESCRIPTIONS  AND DISCHARGE MEDICATIONS: 1. Zetia 10 mg q.d. 2. She was asked to increase her Altace to 10 mg q.d. and her Toprol XL 100 mg    q.d. 3. She was asked to continue her Pravachol 80 mg q.h.s. 4. Imdur 60 q.d. 5. Sublingual nitroglycerin p.r.n.  DISCHARGE INSTRUCTIONS: 1. She was advised no lifting, driving, sexual activity, or heavy exertion for    two days. 2. Maintain low salt, fat, cholesterol diet. 3. If she had any problems with her  catheterization site, she was asked to    call. 4. Dr. Vicenta Aly will obtain fasting lipid and LFTs in six weeks.  She will    see Dr. Vicenta Aly on November 10, 2001, at 11:45 a.m. 5. She will arrange a follow-up with Dr. Doristine Johns in regard to her low TSH level    and her pending parathyroid hormone. Dictated by:   Sharyl Nimrod, P.A.-C. Attending Physician:  Daniel Nones DD:  10/28/01 TD:  10/28/01 Job: [id number]

## 2010-08-31 NOTE — Discharge Summary (Signed)
[place]. [facility]  Patient:    [name], [name] Visit Number: [id number] MRN: [id number]          Service Type: CAT Location: [id number] Attending Physician:  Allene Dillon Dictated by:   Arn Medal, P.A. Admit Date:  05/12/2001 Discharge Date: 05/13/2001   CC:         Allene Dillon, M.D. [facility]  Dr. Doristine Johns   Discharge Summary  DISCHARGE DIAGNOSES: 1. Coronary artery disease, status post cardiac catheterization with placement    of stent times two. 2. History of atherosclerotic peripheral vascular disease. 3. Hypertension. 4. Hyperlipidemia.  HOSPITAL COURSE:  The patient is a 77 year old female with no known history of coronary artery disease.  She reported to the [facility] emergency room complaining of several episodes of substernal chest pain which was burning in nature and occurred with and without exertion.  She stated she had had fever symptoms since she started on Imdur.  She was seen and admitted by Dr. Elta Guadeloupe Pulsipher.  He felt that given her risk factors a cardiac catheterization was indicated.  Later that day the patient was taken to the catheterization laboratory by Dr. Vicenta Aly.  Catheterization results: 1. Left main coronary artery: distal 20% lesion. 2. Left anterior descending coronary artery: diffuse 30% narrowing in the mid    vessel. 3. Circumflex: proximal 60% lesion; ramus intermedius, 60 to 70% lesion    medially with 40% lesion in a lateral branch; 90% lesion at the origin of    a small sub branch. 4. Right coronary artery:  Proximal 75% with probable ruptured plaque; 40%    lesion in the mid vessel followed by 75% in the distal vessel. 5. Left ventricle:  Ejection fraction greater than 60% with mitral    regurgitation secondary to ventricular ectopy.  Dr. Vicenta Aly then proceeded to angioplasty and stenting to the proximal and distal lesions of the right coronary artery.  The 75% mid stenosis was  reduced down to less than 0% with resulting TIMI III flow.  The next day the patient was doing well and was felt to be stable for discharge.  DISCHARGE MEDICATIONS: 1. Enteric coated aspirin 81 mg q.d. 2. Plavix 75 mg q.d. for four weeks. 3. Pravachol 80 mg q.p.m. 4. Toprol XL 100 mg q.d. 5. Imdur 60 mg q.d. 6. Nitroglycerin sublingual as needed.  LABORATORY DATA:  Sodium 140, potassium 4.0, chloride 108, cO2 28, BUN 10, creatinine 0.8, glucose 116.  CBC:  White blood cell count 6.4, hemoglobin 13.0, hematocrit 37.6, platelet count 169K.  ELECTROCARDIOGRAM:  Sinus rhythm at 66.  PR interval 212, QRS 76, QTC 408, axis minus (-)19.  DISCHARGE INSTRUCTIONS:  ACTIVITY:  Patient is to avoid driving, heavy lifting or tub baths for 48 hours.  DIET:  The patient is to follow a low fat, low cholesterol diet.  WOUND CARE: Patient is to watch her catheterization site for any pain, bleeding or swelling and to call the [place] office with any of these problems.  FOLLOW UP:  Patient is to follow up with Dr. ______ as needed or scheduled. She is to follow up with Dr. Vicenta Aly in the office on 06/08/01 at 11:45 in the morning. Dictated by:   Arn Medal, P.A. Attending Physician:  Allene Dillon DD:  05/13/01 TD:  05/13/01 Job: [id number]

## 2010-08-31 NOTE — Op Note (Signed)
   [name], [name]                            ACCOUNT NO.:  [id number]   MEDICAL RECORD NO.:  [id number]                   PATIENT TYPE:  INP   LOCATION:  [id number]                                 FACILITY:  [facility]   PHYSICIAN:  Fenton Malling. Lucia Gaskins, M.D.               DATE OF BIRTH:  Apr 28, 1925   DATE OF PROCEDURE:  11/11/2001  DATE OF DISCHARGE:                                 OPERATIVE REPORT   PREOPERATIVE DIAGNOSES:  Abscess at right sternoclavicular joint and  sternocleidomastoid muscle.   POSTOPERATIVE DIAGNOSES:  Abscess of right sternocleidomastoid muscle going  down to the right sternoclavicular joint.   OPERATION PERFORMED:  Incision and drainage of abscess and ultrasound of  abscess.   SURGEON:  Fenton Malling. Lucia Gaskins, M.D.   ANESTHESIA:  General endotracheal.   ESTIMATED BLOOD LOSS:  20 cc.   DRAINS:  I left an iodoform pack in the wound.   INDICATIONS FOR PROCEDURE:  [name] is a 77 year old black female who is  admitted on November 01, 2001 with neck pain, UTI, a staph septicemia and has  had continued pain in her neck and area around her sternoclavicular joint on  the right.  An MRI suggested fluid in this area.  The patient now comes for  drainage of this area.   DESCRIPTION OF PROCEDURE:  She underwent a general endotracheal anesthetic.  She was already on Ancef as an antibiotic.  I used ultrasound to mark and  measure this fluid collection which went about 9 cm up the length of her  sternocleidomastoid muscle and was about 1 to 2 cm in diameter for the  length of the muscle.  I made a splitting incision into the muscle, drained  out about 20 to 30 cc of pus, got a finger around the head of the  sternocleidomastoid muscle  but could break into no collection around that  area.   I packed the wound with iodoform gauze and sterilely dressed it.  The  patient tolerated the procedure well.  She will resume her diet, antibiotics  and will start dressing changes  tomorrow.                                                Fenton Malling. Lucia Gaskins, M.D.    DHN/MEDQ  D:  11/11/2001  T:  11/17/2001  Job:  [id number]   cc:   Jay Schlichter, M.D.   Edythe Lynn, M.D.   Jagjit Glenford Bayley D. Lia Foyer, M.D. [facility]

## 2010-08-31 NOTE — Consult Note (Signed)
[place]. [facility]  Patient:    [name], [name] Visit Number: [id number] MRN: [id number]          Service Type: MED Location: [id number] Attending Physician:  Axel Filler Dictated by:   Ethelle Lyon, M.D. Proc. Date: 11/02/01 Admit Date:  11/01/2001                            Consultation Report  CARDIOLOGY CONSULTATION REPORT  CHIEF COMPLAINT:  Neck pain.  HISTORY OF PRESENT ILLNESS:  The patient is a 77 year old lady who on October 26, 2001 underwent balloon angioplasty of a right coronary artery.  Left ventricular systolic function was normal.  Since October 28, 2001, the patient has complained of right neck pain which is progressively worsening.  On the 16th she saw her primary care physician who started her on Vioxx and then Vicodin without relief.  Yesterday she presented to the emergency department with worsening pain and was found to have a temperature of 100.4.  She denies chest pain and dyspnea.  Of note, she had no venous or other access via her neck during her most recent hospitalization.  PAST MEDICAL HISTORY:  Hypertension, dyslipidemia, peripheral vascular disease, status post left femoral popliteal bypass in 1995, coronary artery disease, status post proximal and mid right coronary stents previously then balloon angioplasty of the posterior descending artery last week.  CURRENT MEDICATIONS: Toradol, Percocet, Altace 10 mg per day, aspirin 81 mg per day, Pravachol 80 mg per day, Imdur 60 mg per day, Toprol XL 100 mg per day, Vioxx 50 mg per day, Naprosyn 500 mg b.i.d., gatifloxacin 400 mg intravenous q.24h.  ALLERGIES:  PENICILLIN causes a rash.  SOCIAL HISTORY:  She lives with her daughter. She does not smoke or drink alcohol.  FAMILY HISTORY:  Father died age 50 of stroke.  Mother died age 61 of stroke.  REVIEW OF SYSTEMS:  Negative in detail except as above with the exception of some joint stiffness in her hands in the  morning.  PHYSICAL EXAMINATION:  GENERAL:  Well appearing woman in no distress.  VITAL SIGNS:  Heart rate 84, blood pressure 96/58, oxygen saturation 91% on room air.  She has defervesced from 101.4 in the emergency department to 99.0 at present.  NECK:  She has no jugular venous distension.  Carotid pulses are 2+ bilaterally without bruit.  Her right thyroid lobe is full and tender with mild erythema extending over the right neck and out toward her right shoulder.  LUNGS:  She has a few rales at both bases.  CARDIAC:  Her cardiac rhythm is regular with normal rate.  Heart sounds are crisp.  She has normal S1 and S2.  There is no S3, murmurs, rubs, or gallops.  ABDOMEN:  Soft, nondistended, nontender.  There is no hepatosplenomegaly. Bowel sounds normal.  EXTREMITIES:  Without edema.  LABORATORY DATA:  Notable for white blood cell count 14,000, up slightly from yesterday, ESR 100, thyroid studies are pending.  Urinalysis is remarkable for 11 to 20 white blood cells and nitrates.  Electrocardiogram from yesterday demonstrates sinus tachycardia with minor nonspecific ST-T wave abnormalities.  IMPRESSION AND RECOMMENDATIONS:  The patient is a 77 year old lady status post intervention on her right coronary artery one week ago presents with right neck discomfort, tenderness and fever.  It appears she clearly has an inflammatory process of her right neck.  Whether this represents an autoimmune process or infection  is unclear.  With the recent iodine, thyroiditis needs to be strongly considered.  However, bacterial or viral process is certainly also possible.  Given her recent instrumentation, I was initially concerned with the possibility of pericarditis, however, given the clear focal tenderness on her neck, unchanged electrocardiogram and crisp heart sounds without rub, I think pericarditis is distinctly unlikely.  I agree with plans for ultrasound of her neck.  If this is  unrevealing then CT scan may be indicated.  I agree with antibiotic coverage while these issues are sorted out.  Suggest weaning her nonsteroidal anti-inflammatory drugs to a single agent.  Please also resume Plavix at 75 mg per day. Dictated by:   Ethelle Lyon, M.D. Attending Physician:  Axel Filler DD:  11/02/01 TD:  11/05/01 Job: [id number]

## 2010-08-31 NOTE — Assessment & Plan Note (Signed)
[facility] OFFICE NOTE   LACREASHA, JUMA                         MRN:          [id number]  DATE:06/19/2006                            DOB:          29-Jan-1926    CARDIOLOGIST:  Dr. Sabino Snipes.   PRIMARY CARE PHYSICIAN:  Dr. Lynnae January.   HISTORY OF PRESENT ILLNESS:  Ms. Wanzer is a very pleasant 77 year old  female patient followed by Dr. Albertine Patricia with a history of coronary artery  disease, hypertension, peripheral arterial disease and dyslipidemia, who  presents to the office today for followup.  She is doing well without  any complaints of chest pain or shortness of breath.  Denies any syncope  or near syncope.  Dr. Albertine Patricia has actually followed her for minimally  lifestyle-limiting claudication.  She states that she is really not  having any lower extremity pain whatsoever with exertion.  She does have  a lot of back pain.  She had back surgery last year and says that she  continues to have symptoms from this.  She denies any orthopnea or  paroxysmal nocturnal dyspnea.  She denies any syncope or near syncope.   CURRENT MEDICATIONS:  1. Aspirin 81 mg a day.  2. Altace 10 mg a day.  3. Toprol XL 50 mg daily.  4. Multivitamin.  5. Coenzyme Q10.  6. Calcium.  7. Vytorin 10/40 half a tablet daily.  8. Vitamin D.  9. Nitroglycerin p.r.n.  10.Tramadol p.r.n.   ALLERGIES:  Penicillin and Benadryl.   PHYSICAL EXAMINATION:  GENERAL:  She is well-nourished, well-developed  female.  VITAL SIGNS:  She has a blood pressure of 112/70, pulse 62, weight 122  pounds.  HEENT:  Head normocephalic, atraumatic.  EYES:  PERRLA, EOMI, sclerae clear.  NECK:  Carotids without bruits bilaterally.  CARDIAC:  S1, S2, regular rate and rhythm.  LUNGS:  Clear to auscultation bilateral without wheezing, rhonchi,  rales.  ABDOMEN:  Soft, nontender with normoactive bowel sounds, true  organomegaly, no pulsatile masses  appreciated, no pain with palpation.  EXTREMITIES:  Without edema.  Calves are soft, nontender.  NEUROLOGIC:  She is alert and oriented x3.  Cranial nerves 2-12 grossly  intact.   Electrocardiogram reveals a sinus rhythm with a heart rate of 62, left  axis deviation, no acute changes.   IMPRESSION:  1. Coronary artery disease.      a.     History of stenting x2 of the RCA in January 2003 and status       post stenting of the posterior descending branch of the RCA in       July of 2003.      b.     Adenosine Myoview January 2006, no ischemia.  EF 65%.  2. Hypertension, controlled.  3. Dyslipidemia, followed by her primary care physician.  4. Peripheral arterial disease with long segment of bilateral      superficial femoral artery occlusion.      a.     Essentially asymptomatic at this point in time.  5. HISTORY OF SHELLFISH ALLERGY.  6. ALLERGY TO PENICILLIN.   PLAN:  The patient presents to the office today for routine followup.  She is doing well without any complaints of chest pain or shortness of  breath.  She has been followed by Dr. Albertine Patricia in the past, as well for  peripheral arterial disease.  Currently, she is asymptomatic from this.  She did have her lipids checked recently with her primary care  physician.  We will try to obtain those results.  She can follow up in  one year's time.      Richardson Dopp, PA-C  Electronically Signed      Marijo Conception. Verl Blalock, MD, [facility]  Electronically Signed   SW/MedQ  DD: 06/19/2006  DT: 06/19/2006  Job #: [id number]   cc:   Judithe Modest, M.D.  Ethelle Lyon, MD

## 2010-10-04 ENCOUNTER — Encounter (HOSPITAL_COMMUNITY): Payer: Medicare Other

## 2011-01-16 ENCOUNTER — Other Ambulatory Visit: Payer: Self-pay | Admitting: Adult Health

## 2011-02-19 ENCOUNTER — Other Ambulatory Visit: Payer: Self-pay | Admitting: Adult Health

## 2011-02-20 NOTE — Telephone Encounter (Signed)
**Note De-identified  Obfuscation** [name] pt. 

## 2011-04-10 ENCOUNTER — Emergency Department (HOSPITAL_COMMUNITY): Payer: Medicare Other

## 2011-04-10 ENCOUNTER — Other Ambulatory Visit: Payer: Self-pay

## 2011-04-10 ENCOUNTER — Inpatient Hospital Stay (HOSPITAL_COMMUNITY)
Admission: EM | Admit: 2011-04-10 | Discharge: 2011-04-16 | DRG: 394 | Disposition: A | Discharge status: Home / Self Care | Payer: Medicare Other | Attending: Internal Medicine | Admitting: Internal Medicine

## 2011-04-10 ENCOUNTER — Encounter (HOSPITAL_COMMUNITY): Payer: Self-pay | Admitting: Internal Medicine

## 2011-04-10 DIAGNOSIS — I10 Essential (primary) hypertension: Secondary | ICD-10-CM | POA: Diagnosis present

## 2011-04-10 DIAGNOSIS — K5289 Other specified noninfective gastroenteritis and colitis: Secondary | ICD-10-CM

## 2011-04-10 DIAGNOSIS — F329 Major depressive disorder, single episode, unspecified: Secondary | ICD-10-CM | POA: Insufficient documentation

## 2011-04-10 DIAGNOSIS — Z7982 Long term (current) use of aspirin: Secondary | ICD-10-CM | POA: Diagnosis not present

## 2011-04-10 DIAGNOSIS — R109 Unspecified abdominal pain: Secondary | ICD-10-CM | POA: Diagnosis not present

## 2011-04-10 DIAGNOSIS — Z880 Allergy status to penicillin: Secondary | ICD-10-CM

## 2011-04-10 DIAGNOSIS — E785 Hyperlipidemia, unspecified: Secondary | ICD-10-CM | POA: Diagnosis present

## 2011-04-10 DIAGNOSIS — K559 Vascular disorder of intestine, unspecified: Secondary | ICD-10-CM | POA: Diagnosis present

## 2011-04-10 DIAGNOSIS — N390 Urinary tract infection, site not specified: Secondary | ICD-10-CM | POA: Diagnosis present

## 2011-04-10 DIAGNOSIS — I110 Hypertensive heart disease with heart failure: Secondary | ICD-10-CM | POA: Insufficient documentation

## 2011-04-10 DIAGNOSIS — I739 Peripheral vascular disease, unspecified: Secondary | ICD-10-CM | POA: Diagnosis present

## 2011-04-10 DIAGNOSIS — K55039 Acute (reversible) ischemia of large intestine, extent unspecified: Secondary | ICD-10-CM

## 2011-04-10 DIAGNOSIS — I2510 Atherosclerotic heart disease of native coronary artery without angina pectoris: Secondary | ICD-10-CM | POA: Diagnosis present

## 2011-04-10 DIAGNOSIS — D351 Benign neoplasm of parathyroid gland: Secondary | ICD-10-CM | POA: Diagnosis present

## 2011-04-10 DIAGNOSIS — Z9861 Coronary angioplasty status: Secondary | ICD-10-CM | POA: Diagnosis not present

## 2011-04-10 DIAGNOSIS — K529 Noninfective gastroenteritis and colitis, unspecified: Secondary | ICD-10-CM

## 2011-04-10 DIAGNOSIS — Z823 Family history of stroke: Secondary | ICD-10-CM

## 2011-04-10 DIAGNOSIS — M810 Age-related osteoporosis without current pathological fracture: Secondary | ICD-10-CM | POA: Diagnosis present

## 2011-04-10 DIAGNOSIS — Z79899 Other long term (current) drug therapy: Secondary | ICD-10-CM

## 2011-04-10 DIAGNOSIS — Z7902 Long term (current) use of antithrombotics/antiplatelets: Secondary | ICD-10-CM | POA: Diagnosis not present

## 2011-04-10 HISTORY — DX: Patient's other noncompliance with medication regimen: Z91.14

## 2011-04-10 HISTORY — DX: Hypothyroidism, unspecified: E03.9

## 2011-04-10 HISTORY — DX: Patient's other noncompliance with medication regimen for other reason: Z91.148

## 2011-04-10 HISTORY — DX: Acute (reversible) ischemia of large intestine, extent unspecified: K55.039

## 2011-04-10 HISTORY — DX: Phlebitis and thrombophlebitis of unspecified site: I80.9

## 2011-04-10 HISTORY — DX: Bacteremia: R78.81

## 2011-04-10 HISTORY — DX: Unspecified osteoarthritis, unspecified site: M19.90

## 2011-04-10 HISTORY — DX: Acute myocardial infarction, unspecified: I21.9

## 2011-04-10 HISTORY — DX: Reserved for concepts with insufficient information to code with codable children: IMO0002

## 2011-04-10 HISTORY — DX: Cutaneous abscess, unspecified: L02.91

## 2011-04-10 HISTORY — DX: Spinal stenosis, lumbar region with neurogenic claudication: M48.062

## 2011-04-10 LAB — URINALYSIS, ROUTINE W REFLEX MICROSCOPIC
Bilirubin Urine: NEGATIVE
Glucose, UA: NEGATIVE mg/dL
Ketones, ur: NEGATIVE mg/dL
Protein, ur: NEGATIVE mg/dL
Urobilinogen, UA: 0.2 mg/dL (ref 0.0–1.0)

## 2011-04-10 LAB — CBC
HCT: 40.8 % (ref 36.0–46.0)
Hemoglobin: 13.1 g/dL (ref 12.0–15.0)
MCV: 96.9 fL (ref 78.0–100.0)
Platelets: 130 10*3/uL — ABNORMAL LOW (ref 150–400)
RBC: 4.21 MIL/uL (ref 3.87–5.11)
WBC: 13 10*3/uL — ABNORMAL HIGH (ref 4.0–10.5)

## 2011-04-10 LAB — LACTIC ACID, PLASMA: Lactic Acid, Venous: 1.9 mmol/L (ref 0.5–2.2)

## 2011-04-10 LAB — DIFFERENTIAL
Eosinophils Relative: 0 % (ref 0–5)
Lymphocytes Relative: 9 % — ABNORMAL LOW (ref 12–46)
Lymphs Abs: 1.1 10*3/uL (ref 0.7–4.0)
Monocytes Relative: 6 % (ref 3–12)

## 2011-04-10 LAB — COMPREHENSIVE METABOLIC PANEL
AST: 28 U/L (ref 0–37)
Albumin: 3.9 g/dL (ref 3.5–5.2)
Alkaline Phosphatase: 101 U/L (ref 39–117)
Alkaline Phosphatase: 83 U/L (ref 39–117)
BUN: 18 mg/dL (ref 6–23)
CO2: 25 mEq/L (ref 19–32)
CO2: 23 mEq/L (ref 19–32)
Calcium: 11.1 mg/dL — ABNORMAL HIGH (ref 8.4–10.5)
Chloride: 108 mEq/L (ref 96–112)
GFR calc Af Amer: 75 mL/min — ABNORMAL LOW (ref >90)
GFR calc non Af Amer: 50 mL/min — ABNORMAL LOW (ref >90)
GFR calc non Af Amer: 64 mL/min — ABNORMAL LOW (ref >90)
Glucose, Bld: 108 mg/dL — ABNORMAL HIGH (ref 70–99)
Potassium: 4.1 mEq/L (ref 3.5–5.1)
Potassium: 4.1 mEq/L (ref 3.5–5.1)
Total Bilirubin: 0.1 mg/dL — ABNORMAL LOW (ref 0.3–1.2)
Total Protein: 7.1 g/dL (ref 6.0–8.3)

## 2011-04-10 LAB — CARDIAC PANEL(CRET KIN+CKTOT+MB+TROPI)
Relative Index: 2.9 — ABNORMAL HIGH (ref 0.0–2.5)
Total CK: 198 U/L — ABNORMAL HIGH (ref 7–177)

## 2011-04-10 LAB — POCT I-STAT, CHEM 8
BUN: 26 mg/dL — ABNORMAL HIGH (ref 6–23)
Calcium, Ion: 1.63 mmol/L (ref 1.12–1.32)
Chloride: 113 mEq/L — ABNORMAL HIGH (ref 96–112)
Creatinine, Ser: 1 mg/dL (ref 0.50–1.10)
Glucose, Bld: 139 mg/dL — ABNORMAL HIGH (ref 70–99)

## 2011-04-10 LAB — MAGNESIUM: Magnesium: 1.6 mg/dL (ref 1.5–2.5)

## 2011-04-10 LAB — PROTIME-INR: Prothrombin Time: 14 seconds (ref 11.6–15.2)

## 2011-04-10 LAB — URINE MICROSCOPIC-ADD ON

## 2011-04-10 LAB — LIPASE, BLOOD: Lipase: 10 U/L — ABNORMAL LOW (ref 11–59)

## 2011-04-10 MED ORDER — MORPHINE SULFATE 2 MG/ML IJ SOLN
1.0000 mg | Freq: Once | INTRAMUSCULAR | Status: AC
  Administered 2011-04-10: 1 mg via INTRAVENOUS
  Filled 2011-04-10: qty 1

## 2011-04-10 MED ORDER — METOPROLOL SUCCINATE ER 50 MG PO TB24: 50.0000 mg | ORAL_TABLET | Freq: Every day | ORAL | Status: DC

## 2011-04-10 MED ORDER — MORPHINE SULFATE 2 MG/ML IJ SOLN
1.0000 mg | INTRAMUSCULAR | Status: DC | PRN
  Administered 2011-04-10: 1 mg via INTRAVENOUS
  Administered 2011-04-10: 2 mg via INTRAVENOUS
  Administered 2011-04-11: 1 mg via INTRAVENOUS
  Filled 2011-04-10 (×3): qty 1

## 2011-04-10 MED ORDER — IOHEXOL 300 MG/ML  SOLN: 20.0000 mL | INTRAMUSCULAR | Status: DC

## 2011-04-10 MED ORDER — CIPROFLOXACIN IN D5W 400 MG/200ML IV SOLN
400.0000 mg | Freq: Two times a day (BID) | INTRAVENOUS | Status: DC
  Administered 2011-04-10 – 2011-04-14 (×9): 400 mg via INTRAVENOUS
  Filled 2011-04-10 (×10): qty 200

## 2011-04-10 MED ORDER — METRONIDAZOLE IN NACL 5-0.79 MG/ML-% IV SOLN
500.0000 mg | Freq: Three times a day (TID) | INTRAVENOUS | Status: DC
  Administered 2011-04-10 – 2011-04-14 (×12): 500 mg via INTRAVENOUS
  Filled 2011-04-10 (×17): qty 100

## 2011-04-10 MED ORDER — CIPROFLOXACIN HCL 500 MG PO TABS
500.0000 mg | ORAL_TABLET | Freq: Once | ORAL | Status: AC
  Administered 2011-04-10: 500 mg via ORAL
  Filled 2011-04-10: qty 1

## 2011-04-10 MED ORDER — SODIUM CHLORIDE 0.9 % IV BOLUS (SEPSIS)
250.0000 mL | Freq: Once | INTRAVENOUS | Status: AC
  Administered 2011-04-10: 250 mL via INTRAVENOUS

## 2011-04-10 MED ORDER — ENOXAPARIN SODIUM 40 MG/0.4ML ~~LOC~~ SOLN
40.0000 mg | Freq: Every day | SUBCUTANEOUS | Status: DC
  Administered 2011-04-10 – 2011-04-16 (×7): 40 mg via SUBCUTANEOUS
  Filled 2011-04-10 (×7): qty 0.4

## 2011-04-10 MED ORDER — ONDANSETRON HCL 4 MG/2ML IJ SOLN
4.0000 mg | Freq: Once | INTRAMUSCULAR | Status: AC
  Administered 2011-04-10: 4 mg via INTRAVENOUS
  Filled 2011-04-10: qty 2

## 2011-04-10 MED ORDER — BIMATOPROST 0.03 % OP SOLN
1.0000 [drp] | Freq: Every day | OPHTHALMIC | Status: DC
  Administered 2011-04-10 – 2011-04-15 (×6): 1 [drp] via OPHTHALMIC
  Filled 2011-04-10: qty 2.5

## 2011-04-10 MED ORDER — HYDRALAZINE HCL 20 MG/ML IJ SOLN: 10.0000 mg | Freq: Four times a day (QID) | INTRAMUSCULAR | Status: DC | PRN

## 2011-04-10 MED ORDER — ONDANSETRON HCL 4 MG/2ML IJ SOLN: 4.0000 mg | Freq: Four times a day (QID) | INTRAMUSCULAR | Status: DC | PRN

## 2011-04-10 MED ORDER — SODIUM CHLORIDE 0.9 % IV SOLN
Freq: Once | INTRAVENOUS | Status: AC
  Administered 2011-04-10: 03:00:00 via INTRAVENOUS

## 2011-04-10 MED ORDER — SODIUM CHLORIDE 0.9 % IV SOLN
INTRAVENOUS | Status: DC
  Administered 2011-04-10 – 2011-04-15 (×5): via INTRAVENOUS

## 2011-04-10 MED ORDER — IOHEXOL 300 MG/ML  SOLN: 90.0000 mL | Freq: Once | INTRAMUSCULAR | Status: DC | PRN

## 2011-04-10 MED ORDER — MORPHINE SULFATE 4 MG/ML IJ SOLN
4.0000 mg | Freq: Once | INTRAMUSCULAR | Status: AC
  Administered 2011-04-10: 4 mg via INTRAVENOUS
  Filled 2011-04-10: qty 1

## 2011-04-10 MED ORDER — BRIMONIDINE TARTRATE 0.15 % OP SOLN
1.0000 [drp] | Freq: Three times a day (TID) | OPHTHALMIC | Status: DC
  Filled 2011-04-10: qty 5

## 2011-04-10 MED ORDER — ACETAMINOPHEN 325 MG PO TABS
650.0000 mg | ORAL_TABLET | Freq: Four times a day (QID) | ORAL | Status: DC | PRN
  Administered 2011-04-13: 650 mg via ORAL
  Filled 2011-04-10: qty 2

## 2011-04-10 MED ORDER — BRIMONIDINE TARTRATE 0.2 % OP SOLN
1.0000 [drp] | Freq: Three times a day (TID) | OPHTHALMIC | Status: DC
  Administered 2011-04-10 – 2011-04-16 (×17): 1 [drp] via OPHTHALMIC
  Filled 2011-04-10: qty 5

## 2011-04-10 NOTE — ED Notes (Signed)
attempted to call report to floor nurse; RN unable to take report at this time, will call me back in a few minutes.

## 2011-04-10 NOTE — H&P (Signed)
POETRY [name] is an 77 y.o. female.    Chief Complaint: Abdominal Pain  HPI: [name] is an 77 year old woman with past medical history significant for coronary artery disease status post stenting, hyperlipidemia, hypertension, peripheral vascular disease, arthritis and hyperparathyroidism who presents to the emergency department with her daughter and granddaughter for complaint of abdominal pain. Patient states that the abdominal pain first began yesterday evening and progressively got worse. At 1 AM patient's daughter had called EMS. Patient was profusely sweating and complained of severe abdominal pain located in her mid abdomen. The pain does not radiate her travel anywhere. The pain is described as a sharp pain.The pain was associated with nausea and patient felt like she wanted to throw up however was not able to. Patient had one loose bowel movement yesterday and 3 loose bowel movements upon arriving to the emergency department. No obvious blood was noted however on fecal occult blood test there was trace amounts of blood noted in stool. Patient has not recently been on any antibiotics. Patient denies cough, shortness of breath, and chest pain. She does complain of chills and may have had a fever however it it is not documented. Patient denies any urinary symptoms such as burning upon urination, urgency and frequency. However patient does point to suprapubic region for where her abdominal pain is located. Patient states that she has not had this pain in the past.  Patient received pain medication and thus it is low in intensity right now.  Past Medical History  Diagnosis Date  . CAD (coronary artery disease) 2003, 2010    s/p stenting of RCA x2, Last cardiac cath in 2011 with 90% occlusion in obtuse marginal but too unstable to stent, with no stenting, last Echo 2010 showing normal EF 60-65% with grade 2 diastolic dysfunction   . Hyperlipidemia   . HTN (hypertension)   . PVD (peripheral  vascular disease)     s/p left fem-pop bypass graft  . Hyperparathyroidism 2003    parathyroid adenoma localized to the right inferior thyroid lobe region  . Renal artery stenosis     left, 40%  . Osteoporosis 2009    diagnosed by bone density scan in 2009, on Vit D only, no bisphosphonate therapy  . Bacteremia 2003    hx of staph bacteremia  . Degenerative disc disease   . Lumbar stenosis with neurogenic claudication 2006    s/p decompression L2-L5  . Abscess 2003    I&D SCM abscess  . Arthritis   . History of medication noncompliance     concerning Plavix  . Depression     Past Surgical History  Procedure Date  . Femoral bypass     Family History  Problem Relation Age of Onset  . Stroke Mother   . Stroke Father    Social History:  reports that she has never smoked. She does not have any smokeless tobacco history on file. She reports that she does not drink alcohol or use illicit drugs.  Allergies:  Allergies  Allergen Reactions  . Diphenhydramine Hcl Other (See Comments)    unknown  . Penicillins Other (See Comments)    unknown    Medications Prior to Admission  Medication Sig Dispense Refill  . aspirin 325 MG tablet Take 325 mg by mouth daily.        . bimatoprost (LUMIGAN) 0.03 % ophthalmic drops 1 drop at bedtime.        . brimonidine (ALPHAGAN) 0.15 % ophthalmic solution 1 drop  3 (three) times daily.        . clopidogrel (PLAVIX) 75 MG tablet Take 1 tablet (75 mg total) by mouth daily.  30 tablet  11  . metoprolol (TOPROL-XL) 50 MG 24 hr tablet Take 1 tablet (50 mg total) by mouth daily.  30 tablet  11  . NITROSTAT 0.4 MG SL tablet DISSOLVE ONE TABLET UNDER THE TONGUE EVERY 5 MINUTES AS NEEDED FOR CHEST PAIN.  DO NOT EXCEED A TOTAL OF 3 DOSES IN 15 MINUTES  25 each  6  . ramipril (ALTACE) 10 MG capsule TAKE ONE CAPSULE BY MOUTH EVERY DAY  30 capsule  6  Venlafaxine 37.5 mg po qday Vitamin B12 and D3 Tramadol prn pain  Results for orders placed during the  hospital encounter of 04/10/11 (from the past 48 hour(s))  COMPREHENSIVE METABOLIC PANEL     Status: Abnormal   Collection Time   04/10/11  2:55 AM      Component Value Range Comment   Sodium 144  135 - 145 (mEq/L)    Potassium 4.1  3.5 - 5.1 (mEq/L)    Chloride 108  96 - 112 (mEq/L)    CO2 25  19 - 32 (mEq/L)    Glucose, Bld 144 (*) 70 - 99 (mg/dL)    BUN 23  6 - 23 (mg/dL)    Creatinine, Ser 1.01  0.50 - 1.10 (mg/dL)    Calcium 12.7 (*) 8.4 - 10.5 (mg/dL)    Total Protein 7.5  6.0 - 8.3 (g/dL)    Albumin 3.9  3.5 - 5.2 (g/dL)    AST 28  0 - 37 (U/L)    ALT 20  0 - 35 (U/L)    Alkaline Phosphatase 101  39 - 117 (U/L)    Total Bilirubin 0.1 (*) 0.3 - 1.2 (mg/dL)    GFR calc non Af Amer 50 (*) >90 (mL/min)    GFR calc Af Amer 58 (*) >90 (mL/min)   LACTIC ACID, PLASMA     Status: Abnormal   Collection Time   04/10/11  2:56 AM      Component Value Range Comment   Lactic Acid, Venous 2.4 (*) 0.5 - 2.2 (mmol/L)   POCT I-STAT, CHEM 8     Status: Abnormal   Collection Time   04/10/11  3:15 AM      Component Value Range Comment   Sodium 146 (*) 135 - 145 (mEq/L)    Potassium 4.0  3.5 - 5.1 (mEq/L)    Chloride 113 (*) 96 - 112 (mEq/L)    BUN 26 (*) 6 - 23 (mg/dL)    Creatinine, Ser 1.00  0.50 - 1.10 (mg/dL)    Glucose, Bld 139 (*) 70 - 99 (mg/dL)    Calcium, Ion 1.63 (*) 1.12 - 1.32 (mmol/L)    TCO2 25  0 - 100 (mmol/L)    Hemoglobin 13.9  12.0 - 15.0 (g/dL)    HCT 41.0  36.0 - 46.0 (%)   URINALYSIS, ROUTINE W REFLEX MICROSCOPIC     Status: Abnormal   Collection Time   04/10/11  3:23 AM      Component Value Range Comment   Color, Urine YELLOW  YELLOW     APPearance CLEAR  CLEAR     Specific Gravity, Urine 1.012  1.005 - 1.030     pH 5.0  5.0 - 8.0     Glucose, UA NEGATIVE  NEGATIVE (mg/dL)    Hgb urine dipstick TRACE (*) NEGATIVE  Bilirubin Urine NEGATIVE  NEGATIVE     Ketones, ur NEGATIVE  NEGATIVE (mg/dL)    Protein, ur NEGATIVE  NEGATIVE (mg/dL)    Urobilinogen, UA  0.2  0.0 - 1.0 (mg/dL)    Nitrite NEGATIVE  NEGATIVE     Leukocytes, UA TRACE (*) NEGATIVE    URINE MICROSCOPIC-ADD ON     Status: Normal   Collection Time   04/10/11  3:23 AM      Component Value Range Comment   WBC, UA 7-10  <3 (WBC/hpf)    RBC / HPF 3-6  <3 (RBC/hpf)    Bacteria, UA RARE  RARE    CBC     Status: Abnormal   Collection Time   04/10/11  5:14 AM      Component Value Range Comment   WBC 13.0 (*) 4.0 - 10.5 (K/uL)    RBC 4.21  3.87 - 5.11 (MIL/uL)    Hemoglobin 13.1  12.0 - 15.0 (g/dL)    HCT 40.8  36.0 - 46.0 (%)    MCV 96.9  78.0 - 100.0 (fL)    MCH 31.1  26.0 - 34.0 (pg)    MCHC 32.1  30.0 - 36.0 (g/dL)    RDW 12.7  11.5 - 15.5 (%)    Platelets 130 (*) 150 - 400 (K/uL)   DIFFERENTIAL     Status: Abnormal   Collection Time   04/10/11  5:14 AM      Component Value Range Comment   Neutrophils Relative 85 (*) 43 - 77 (%)    Neutro Abs 11.1 (*) 1.7 - 7.7 (K/uL)    Lymphocytes Relative 9 (*) 12 - 46 (%)    Lymphs Abs 1.1  0.7 - 4.0 (K/uL)    Monocytes Relative 6  3 - 12 (%)    Monocytes Absolute 0.7  0.1 - 1.0 (K/uL)    Eosinophils Relative 0  0 - 5 (%)    Eosinophils Absolute 0.0  0.0 - 0.7 (K/uL)    Basophils Relative 0  0 - 1 (%)    Basophils Absolute 0.0  0.0 - 0.1 (K/uL)    Ct Abdomen Pelvis W Contrast  04/10/2011  *RADIOLOGY REPORT*  Clinical Data: Abdominal pain, nausea, leukocytosis  CT ABDOMEN AND PELVIS WITH CONTRAST  Technique:  Multidetector CT imaging of the abdomen and pelvis was performed following the standard protocol during bolus administration of intravenous contrast.   Sagittal and coronal MPR images reconstructed from axial data set.  Contrast:  95 ml Omnipaque-300 IV; Dilute oral contrast.  Comparison: 08/09/2003 gas  Findings: Dependent atelectasis bilateral lung bases. Atherosclerotic calcifications aorta and coronary arteries. Tiny nonspecific low attenuation foci in liver unchanged, question cyst. Cyst inferior pole left kidney, 2.5 x 2.4  cm image 31. Mild enlargement versus nodularity of left adrenal gland unchanged. Liver, spleen, pancreas, kidneys, and adrenal glands otherwise normal appearance. Marked thickening of transverse colon compatible with colitis. Stomach, small bowel loops and remaining colon unremarkable.  Appendix not definitely visualized. Bladder, ureters, uterus and adnexae unremarkable. No mass, adenopathy, free fluid or hernia. Surgical clips at right inguinal region from prior vascular surgery. Short segment of a femoral graft is identified, without internal contrast enhancement, question occluded. Severe multilevel degenerative disc and facet disease changes of the thoracolumbar spine.  IMPRESSION: Colitis involving the transverse colon. Differential diagnosis includes infection, inflammatory bowel disease, and ischemia. No other significant intra-abdominal or intrapelvic abnormalities. Prior vascular graft at left inguinal region, question occluded, recommend clinical correlation.  Original Report Authenticated By: Burnetta Sabin, M.D.    Review of Systems  Constitutional: Positive for fever, chills, malaise/fatigue and diaphoresis.  Respiratory: Negative for cough and shortness of breath.   Cardiovascular: Negative for chest pain.  Gastrointestinal: Positive for nausea, abdominal pain, diarrhea and blood in stool. Negative for heartburn, vomiting and constipation.  Genitourinary: Negative for dysuria, urgency, frequency, hematuria and flank pain.  Neurological: Positive for weakness.    Blood pressure 144/81, pulse 87, temperature 99.3 F (37.4 C), temperature source Oral, resp. rate 18, SpO2 96.00%. Physical Exam  Constitutional: She is oriented to person, place, and time. She appears well-developed.  HENT:  Head: Normocephalic.  Neck: Normal range of motion. Neck supple.  Cardiovascular: Normal rate, regular rhythm and normal heart sounds.   Respiratory: Effort normal and breath sounds normal.  GI: Soft.  She exhibits no mass. There is tenderness. There is no rebound and no guarding.       Hypoactive to diminished bowel sounds diffusely  Suprapubic tenderness   Genitourinary: Vagina normal.  Musculoskeletal: She exhibits no edema.       Left foot cooler than right secondary to PVD but at baseline per patient, pulses are faint but palpable  Neurological: She is alert and oriented to person, place, and time.  Skin: Skin is warm and dry.  Psychiatric: She has a normal mood and affect.     Assessment/Plan  1.) Abdominal pain - differential diagnosis includes colitis versus ischemia versus urinary tract infection. CT scan positive for inflammation of the transverse colon making colitis likely. There is concern for ischemia given elevated white count, lactic acid and abdominal pain with trace blood seen on fecal: Blood tests. Additionally patient has risk factors such as coronary artery disease status post stenting and peripheral vascular disease are concerning for developing ischemia. Urinalysis positive for small leukocytes and WBCs per microscopy.  Plan: -Admit to telemetry -Start patient on ciprofloxacin and Flagyl IV for treatment of colitis -Consult general surgery for further evaluation of possible ischemia -Repeat lactic acid -Ciprofloxacin we'll also empirically treat urinary tract infection -Send urine for culture with sensitivity -Hold Plavix until patient is seen by surgery -Keep patient NPO -Morphine for pain control at low dose  2.) HTN: Hold BP meds as patient is NPO, and give hydralazine prn.   3.) CAD: Currently asymptomatic without chest pain. Will hold Plavix in case patient needs surgery. Continue medical management with Plavix, statin, beta blocker and ACE inhibitor once patient is clinically stable.  4.) PVD: Stable, continue Plavix if patient does not require surgery.  5.) Glaucoma - continue home dose eyedrops.  6.) Osteoporosis-patient has known osteoporosis her  bone density scan in 2009. Patient is only on vitamin D 2000 units per day. This will need to be further explored as an outpatient if patient needs to be started on a bisphosphonate. No evidence of any recent falls.  7.) Hyperparathyroidism-patient has a known parathyroid adenoma. Calcium is slightly elevated at 12.7 today. Do not see any further workup other than a parathyroid scan done in 2009. Patient may currently asymptomatic. This will also need to be followed up as an outpatient.  8.) DVT: Lovenox   Mareesa Gathright 04/10/2011, 11:26 AM

## 2011-04-10 NOTE — ED Notes (Signed)
abd pain with nausea onset 30 min prior to EMS arrival no OTC meds attempted

## 2011-04-10 NOTE — ED Provider Notes (Signed)
History     CSN: [id number]  Arrival date & time 04/10/11  0218   First MD Initiated Contact with Patient 04/10/11 0235      Chief Complaint  Patient presents with  . Abdominal Pain  . Nausea    (Consider location/radiation/quality/duration/timing/severity/associated sxs/prior treatment) HPI Comments: [name] was awakened by diffuse lower abdominal crampy pain went to the bathroom thinking that she had to have a bowel movement.  Daughter noticed her going in and 20 minutes later checked on her.  She was still sitting on the toilet.  She had not had a bowel movement, but was still walking, complaining of lower abdominal pain, nausea, but no vomiting.  She was diaphoretic.  At that time.  Family called EMS who transported her to the emergency room for evaluation  Patient is a 77 y.o. female presenting with abdominal pain. The history is provided by the patient and a relative.  Abdominal Pain The primary symptoms of the illness include abdominal pain and nausea. The primary symptoms of the illness do not include fever, vomiting, diarrhea or dysuria. The current episode started less than 1 hour ago. The onset of the illness was sudden.  Nausea began today.  Symptoms associated with the illness do not include constipation.    Past Medical History  Diagnosis Date  . CAD (coronary artery disease)   . Hyperlipidemia   . HTN (hypertension)   . PVD (peripheral vascular disease)   . Hyperparathyroidism     Past Surgical History  Procedure Date  . Femoral bypass     No family history on file.  History  Substance Use Topics  . Smoking status: Never Smoker   . Smokeless tobacco: Not on file  . Alcohol Use: No    OB History    Grav Para Term Preterm Abortions TAB SAB Ect Mult Living                  Review of Systems  Constitutional: Negative for fever.  HENT: Negative.   Respiratory: Negative.   Cardiovascular: Negative for chest pain and leg swelling.    Gastrointestinal: Positive for nausea and abdominal pain. Negative for vomiting, diarrhea and constipation.  Genitourinary: Negative for dysuria.  Musculoskeletal: Negative.   Skin: Positive for pallor.  Neurological: Negative for dizziness and weakness.  Hematological: Negative.   Psychiatric/Behavioral: Negative.     Allergies  Diphenhydramine hcl and Penicillins  Home Medications   Current Outpatient Rx  Name Route Sig Dispense Refill  . ASPIRIN 325 MG PO TABS Oral Take 325 mg by mouth daily.      Marland Kitchen BIMATOPROST 0.03 % OP SOLN  1 drop at bedtime.      Marland Kitchen BRIMONIDINE TARTRATE 0.15 % OP SOLN  1 drop 3 (three) times daily.      Marland Kitchen VITAMIN D 2000 UNITS PO TABS Oral Take 2,000 Units by mouth daily.      Marland Kitchen CLOPIDOGREL BISULFATE 75 MG PO TABS Oral Take 1 tablet (75 mg total) by mouth daily. 30 tablet 11  . CO Q-10 50 MG PO CAPS Oral Take 1 capsule by mouth daily.      Marland Kitchen VITAMIN B-12 5000 MCG SL SUBL Sublingual Place 1 tablet under the tongue daily.      Marland Kitchen METOPROLOL SUCCINATE ER 50 MG PO TB24 Oral Take 1 tablet (50 mg total) by mouth daily. 30 tablet 11  . HAIR/SKIN/NAILS PO Oral Take 1 tablet by mouth daily.      Marland Kitchen NITROSTAT  0.4 MG SL SUBL  DISSOLVE ONE TABLET UNDER THE TONGUE EVERY 5 MINUTES AS NEEDED FOR CHEST PAIN.  DO NOT EXCEED A TOTAL OF 3 DOSES IN 15 MINUTES 25 each 6  . RAMIPRIL 10 MG PO CAPS  TAKE ONE CAPSULE BY MOUTH EVERY DAY 30 capsule 6    BP 160/74  Pulse 67  Temp 97.7 F (36.5 C)  Resp 12  SpO2 95%  Physical Exam  Constitutional: She is oriented to person, place, and time. She appears well-developed.  HENT:  Head: Normocephalic.  Neck: Normal range of motion.  Cardiovascular: Normal rate.   Pulmonary/Chest: Effort normal.  Abdominal: Normal appearance. She exhibits no distension. Bowel sounds are increased. There is tenderness in the right lower quadrant, suprapubic area and left lower quadrant. There is no rebound.  Musculoskeletal: Normal range of motion.   Neurological: She is oriented to person, place, and time.  Skin: Skin is warm. There is pallor.  Psychiatric: She has a normal mood and affect.    ED Course  Procedures (including critical care time)  Labs Reviewed  COMPREHENSIVE METABOLIC PANEL - Abnormal; Notable for the following:    Glucose, Bld 144 (*)    Calcium 12.7 (*)    Total Bilirubin 0.1 (*)    GFR calc non Af Amer 50 (*)    GFR calc Af Amer 58 (*)    All other components within normal limits  URINALYSIS, ROUTINE W REFLEX MICROSCOPIC - Abnormal; Notable for the following:    Hgb urine dipstick TRACE (*)    Leukocytes, UA TRACE (*)    All other components within normal limits  LACTIC ACID, PLASMA - Abnormal; Notable for the following:    Lactic Acid, Venous 2.4 (*)    All other components within normal limits  POCT I-STAT, CHEM 8 - Abnormal; Notable for the following:    Sodium 146 (*)    Chloride 113 (*)    BUN 26 (*)    Glucose, Bld 139 (*)    Calcium, Ion 1.63 (*)    All other components within normal limits  URINE MICROSCOPIC-ADD ON  I-STAT, CHEM 8  POCT OCCULT BLOOD STOOL, DEVICE   No results found.   No diagnosis found.  Shortly after arrival.  Patient had large bowel movement Patient's abdominal discomfort is minimal at this point after having her admitted.  It is noted that she has a UTI on a catheter urine specimen will treat with Cipro by mouth and a location to follow up with her primary care physician  MDM  gastroenteritis        Garald Balding, NP 04/10/11 0256  Garald Balding, NP 04/10/11 0304  Garald Balding, NP 04/10/11 0422

## 2011-04-10 NOTE — ED Notes (Signed)
Pt with large diarrhea movement

## 2011-04-10 NOTE — ED Notes (Signed)
Admitting MD at bedside. Report to [facility], Therapist, sports.  Pt to move to room 20

## 2011-04-10 NOTE — ED Notes (Signed)
Family at bedside. Informed patient and/or family of status. Awaiting admitting/consulting MD. Pt resting. NAD.

## 2011-04-10 NOTE — Consult Note (Signed)
The patient has a transverse colitis of unknown etiology.  She is currently having abdominal pain in the lower abdomen.  No peritonitis and excellent bowel sounds.Will follow but no need for surgery at this point.  Kathryne Eriksson. Dahlia Bailiff, MD, [place] [phone] [facility]

## 2011-04-10 NOTE — ED Provider Notes (Signed)
Medical screening examination/treatment/procedure(s) were performed by non-physician practitioner and as supervising physician I was immediately available for consultation/collaboration.   Alfonzo Feller, DO 04/10/11 1911

## 2011-04-10 NOTE — ED Notes (Signed)
Report received, assumed care.  

## 2011-04-10 NOTE — ED Provider Notes (Signed)
Medical screening examination/treatment/procedure(s) were conducted as a shared visit with non-physician practitioner(s) and myself.  I personally evaluated the patient during the encounter   Cindy Octave, MD 04/10/11 1600

## 2011-04-10 NOTE — ED Provider Notes (Signed)
Patient care resumed from Dr. Thad Ranger and Junius Creamer.  Patient presented with diffuse lower abdominal pain and constipation.  Area the patient did have a bowel movement here in the emergency department which relieved some of her pain.  Patient denies vomiting.  Patient is pending CT results.  Her gallops plan if patient had a negative CT she can be discharged on Cipro for a urinary tract infection that was found today.  CT Results: IMPRESSION: Colitis involving the transverse colon. Differential diagnosis includes infection, inflammatory bowel disease, and ischemia. No other significant intra-abdominal or intrapelvic abnormalities. Prior vascular graft at left inguinal region, question occluded, recommend clinical correlation.  Original Report Authenticated By: Burnetta Sabin, M.D.  10:00 AM Based on the above CT results patient will be admitted to the hospital for likely colitis.  Gen. surgery has been contacted for the possible risk of ischemia and elevated lactic acid of 2.4.  [facility]  Dr[facility] Nance Pew.  Spoke with Gen. surgery physician assistant Will Creig Hines who states it will calm and see the patient in the emergency department to evaluate for possible ischemia.  [place], [place] 04/10/11 1015

## 2011-04-10 NOTE — ED Notes (Signed)
Patient transported to CT 

## 2011-04-10 NOTE — ED Provider Notes (Signed)
This patient presents with lower abdominal pain and some nausea.  Pt has associated diarrhea.  Will obtain CT abd/pel to r/o acute abd causes such as ischemia/colitis.    Lezlie Octave, MD 04/10/11 [phone]

## 2011-04-10 NOTE — ED Notes (Signed)
Large BM-positive occult blood, ARNP, Gail aware.

## 2011-04-10 NOTE — ED Notes (Signed)
Pt moved from PDA 13 to yellow 20 and placed on monitor

## 2011-04-10 NOTE — Progress Notes (Signed)
Patient's ck-198, MB-5.8.   Dr. Marcello Moores paged and notified.  No new orders received.  Will continue to monitor.  Sanda Linger

## 2011-04-10 NOTE — Plan of Care (Signed)
Problem: Phase I Progression Outcomes Goal: OOB as tolerated unless otherwise ordered Outcome: Not Applicable Date Met:  [id number] ORDERS FOR COMPLETE BEDREST

## 2011-04-10 NOTE — Consult Note (Signed)
Reason for Consult:Colitis  Referring Physician: Jadi Warm is an 77 y.o. female.  HPI: Patient's 77 year old female with history significant for coronary artery disease with stents, recent MI 2011, left femoropopliteal bypass graft, and 40% renal artery stenosis. Patient presents with abdominal pain which started in the last night around midnight. She went to the bathroom with ongoing abdominal pain she was unable to have a bowel movement she developed nausea but no emesis. Her family found her sweaty been having intermittent chills and fever. EMS was called and she was transferred to the ER at [facility]. Workup in the ER shows a white count of 13,000, and mild renal insufficiency, hypercalcemia. She's had 3 loose bowel movements in the ER since admission, there reported guaiac positive. CT scan shows mild thickening of the transverse colon compatible with colitis stomach small bowel and a remaining colon are unremarkable. The appendix was not visualized. There is a short segment of femoral graft identified without internal contrast enhancement suggesting it might be occluded. Patient's abdominal pain has pretty much resolved. When you palpate abdomen she has no tenderness, she denies any significant discomfort or rest. We're asked to see in consultation.  Past Medical History  Diagnosis Date  . CAD (coronary artery disease) 2003, 2010    s/p stenting of RCA x2, Last cardiac cath in 2011 with 90% occlusion in obtuse marginal but too unstable to stent, with no stenting, last Echo 2010 showing normal EF 60-65% with grade 2 diastolic dysfunction   . Hyperlipidemia   . HTN (hypertension)   . PVD (peripheral vascular disease)     s/p left fem-pop bypass graft  . Hyperparathyroidism 2003    parathyroid adenoma localized to the right inferior thyroid lobe region  . Renal artery stenosis     left, 40%  . Osteoporosis 2009    diagnosed by bone density scan in 2009, on Vit D only, no  bisphosphonate therapy  . Bacteremia 2003    hx of staph bacteremia  . Degenerative disc disease   . Lumbar stenosis with neurogenic claudication 2006    s/p decompression L2-L5  . Abscess 2003    I&D SCM abscess  . Arthritis   . History of medication noncompliance     concerning Plavix  . Depression   Chronic back pain Glaucoma Past Surgical History  Procedure Date  . Femoral bypass   Left Femoropopliteal bypass Back surgery Appendectomy  Family History  Problem Relation Age of Onset  . Stroke Mother   . Stroke Father     Social History:  reports that she has never smoked. She does not have any smokeless tobacco history on file. She reports that she does not drink alcohol or use illicit drugs.  Allergies:  Allergies  Allergen Reactions  . Diphenhydramine Hcl Other (See Comments)    unknown  . Penicillins Other (See Comments)    unknown    Medications:  Prior to Admission:  Prescriptions prior to admission  Medication Sig Dispense Refill  . aspirin 325 MG tablet Take 325 mg by mouth daily.        . bimatoprost (LUMIGAN) 0.03 % ophthalmic drops 1 drop at bedtime.        . brimonidine (ALPHAGAN) 0.15 % ophthalmic solution 1 drop 3 (three) times daily.        . Cholecalciferol (VITAMIN D) 2000 UNITS tablet Take 2,000 Units by mouth daily.        . clopidogrel (PLAVIX) 75 MG tablet  Take 1 tablet (75 mg total) by mouth daily.  30 tablet  11  . Coenzyme Q10 (CO Q-10) 50 MG CAPS Take 1 capsule by mouth daily.        . Cyanocobalamin (VITAMIN B-12) 5000 MCG SUBL Place 1 tablet under the tongue daily.        . metoprolol (TOPROL-XL) 50 MG 24 hr tablet Take 1 tablet (50 mg total) by mouth daily.  30 tablet  11  . Multiple Vitamins-Minerals (HAIR/SKIN/NAILS PO) Take 1 tablet by mouth daily.        Marland Kitchen NITROSTAT 0.4 MG SL tablet DISSOLVE ONE TABLET UNDER THE TONGUE EVERY 5 MINUTES AS NEEDED FOR CHEST PAIN.  DO NOT EXCEED A TOTAL OF 3 DOSES IN 15 MINUTES  25 each  6  . ramipril  (ALTACE) 10 MG capsule TAKE ONE CAPSULE BY MOUTH EVERY DAY  30 capsule  6   Scheduled:   . sodium chloride   Intravenous Once  . bimatoprost  1 drop Both Eyes QHS  . brimonidine  1 drop Both Eyes TID  . ciprofloxacin  400 mg Intravenous Q12H  . ciprofloxacin  500 mg Oral Once  . enoxaparin  40 mg Subcutaneous Daily  . metronidazole  500 mg Intravenous Q8H  .  morphine injection  4 mg Intravenous Once  .  morphine injection  4 mg Intravenous Once  . ondansetron  4 mg Intravenous Once  . sodium chloride  250 mL Intravenous Once  . DISCONTD: brimonidine  1 drop Both Eyes TID  . DISCONTD: iohexol  20 mL Oral Q1 Hr x 2   Continuous:   . sodium chloride 100 mL/hr at 04/10/11 1259    Results for orders placed during the hospital encounter of 04/10/11 (from the past 48 hour(s))  COMPREHENSIVE METABOLIC PANEL     Status: Abnormal   Collection Time   04/10/11  2:55 AM      Component Value Range Comment   Sodium 144  135 - 145 (mEq/L)    Potassium 4.1  3.5 - 5.1 (mEq/L)    Chloride 108  96 - 112 (mEq/L)    CO2 25  19 - 32 (mEq/L)    Glucose, Bld 144 (*) 70 - 99 (mg/dL)    BUN 23  6 - 23 (mg/dL)    Creatinine, Ser 1.01  0.50 - 1.10 (mg/dL)    Calcium 12.7 (*) 8.4 - 10.5 (mg/dL)    Total Protein 7.5  6.0 - 8.3 (g/dL)    Albumin 3.9  3.5 - 5.2 (g/dL)    AST 28  0 - 37 (U/L)    ALT 20  0 - 35 (U/L)    Alkaline Phosphatase 101  39 - 117 (U/L)    Total Bilirubin 0.1 (*) 0.3 - 1.2 (mg/dL)    GFR calc non Af Amer 50 (*) >90 (mL/min)    GFR calc Af Amer 58 (*) >90 (mL/min)   LACTIC ACID, PLASMA     Status: Abnormal   Collection Time   04/10/11  2:56 AM      Component Value Range Comment   Lactic Acid, Venous 2.4 (*) 0.5 - 2.2 (mmol/L)   POCT I-STAT, CHEM 8     Status: Abnormal   Collection Time   04/10/11  3:15 AM      Component Value Range Comment   Sodium 146 (*) 135 - 145 (mEq/L)    Potassium 4.0  3.5 - 5.1 (mEq/L)    Chloride 113 (*)  96 - 112 (mEq/L)    BUN 26 (*) 6 - 23  (mg/dL)    Creatinine, Ser 1.00  0.50 - 1.10 (mg/dL)    Glucose, Bld 139 (*) 70 - 99 (mg/dL)    Calcium, Ion 1.63 (*) 1.12 - 1.32 (mmol/L)    TCO2 25  0 - 100 (mmol/L)    Hemoglobin 13.9  12.0 - 15.0 (g/dL)    HCT 41.0  36.0 - 46.0 (%)   URINALYSIS, ROUTINE W REFLEX MICROSCOPIC     Status: Abnormal   Collection Time   04/10/11  3:23 AM      Component Value Range Comment   Color, Urine YELLOW  YELLOW     APPearance CLEAR  CLEAR     Specific Gravity, Urine 1.012  1.005 - 1.030     pH 5.0  5.0 - 8.0     Glucose, UA NEGATIVE  NEGATIVE (mg/dL)    Hgb urine dipstick TRACE (*) NEGATIVE     Bilirubin Urine NEGATIVE  NEGATIVE     Ketones, ur NEGATIVE  NEGATIVE (mg/dL)    Protein, ur NEGATIVE  NEGATIVE (mg/dL)    Urobilinogen, UA 0.2  0.0 - 1.0 (mg/dL)    Nitrite NEGATIVE  NEGATIVE     Leukocytes, UA TRACE (*) NEGATIVE    URINE MICROSCOPIC-ADD ON     Status: Normal   Collection Time   04/10/11  3:23 AM      Component Value Range Comment   WBC, UA 7-10  <3 (WBC/hpf)    RBC / HPF 3-6  <3 (RBC/hpf)    Bacteria, UA RARE  RARE    CBC     Status: Abnormal   Collection Time   04/10/11  5:14 AM      Component Value Range Comment   WBC 13.0 (*) 4.0 - 10.5 (K/uL)    RBC 4.21  3.87 - 5.11 (MIL/uL)    Hemoglobin 13.1  12.0 - 15.0 (g/dL)    HCT 40.8  36.0 - 46.0 (%)    MCV 96.9  78.0 - 100.0 (fL)    MCH 31.1  26.0 - 34.0 (pg)    MCHC 32.1  30.0 - 36.0 (g/dL)    RDW 12.7  11.5 - 15.5 (%)    Platelets 130 (*) 150 - 400 (K/uL)   DIFFERENTIAL     Status: Abnormal   Collection Time   04/10/11  5:14 AM      Component Value Range Comment   Neutrophils Relative 85 (*) 43 - 77 (%)    Neutro Abs 11.1 (*) 1.7 - 7.7 (K/uL)    Lymphocytes Relative 9 (*) 12 - 46 (%)    Lymphs Abs 1.1  0.7 - 4.0 (K/uL)    Monocytes Relative 6  3 - 12 (%)    Monocytes Absolute 0.7  0.1 - 1.0 (K/uL)    Eosinophils Relative 0  0 - 5 (%)    Eosinophils Absolute 0.0  0.0 - 0.7 (K/uL)    Basophils Relative 0  0 - 1 (%)      Basophils Absolute 0.0  0.0 - 0.1 (K/uL)     Ct Abdomen Pelvis W Contrast  04/10/2011  *RADIOLOGY REPORT*  Clinical Data: Abdominal pain, nausea, leukocytosis  CT ABDOMEN AND PELVIS WITH CONTRAST  Technique:  Multidetector CT imaging of the abdomen and pelvis was performed following the standard protocol during bolus administration of intravenous contrast.   Sagittal and coronal MPR images reconstructed from axial data set.  Contrast:  95 ml Omnipaque-300 IV; Dilute oral contrast.  Comparison: 08/09/2003 gas  Findings: Dependent atelectasis bilateral lung bases. Atherosclerotic calcifications aorta and coronary arteries. Tiny nonspecific low attenuation foci in liver unchanged, question cyst. Cyst inferior pole left kidney, 2.5 x 2.4 cm image 31. Mild enlargement versus nodularity of left adrenal gland unchanged. Liver, spleen, pancreas, kidneys, and adrenal glands otherwise normal appearance. Marked thickening of transverse colon compatible with colitis. Stomach, small bowel loops and remaining colon unremarkable.  Appendix not definitely visualized. Bladder, ureters, uterus and adnexae unremarkable. No mass, adenopathy, free fluid or hernia. Surgical clips at right inguinal region from prior vascular surgery. Short segment of a femoral graft is identified, without internal contrast enhancement, question occluded. Severe multilevel degenerative disc and facet disease changes of the thoracolumbar spine.  IMPRESSION: Colitis involving the transverse colon. Differential diagnosis includes infection, inflammatory bowel disease, and ischemia. No other significant intra-abdominal or intrapelvic abnormalities. Prior vascular graft at left inguinal region, question occluded, recommend clinical correlation.  Original Report Authenticated By: Burnetta Sabin, M.D.    Review of Systems  Constitutional: Positive for fever and chills.  HENT: Negative.  Negative for neck pain.   Eyes: Negative.   Respiratory:  Negative.   Cardiovascular: Positive for claudication. Negative for chest pain, orthopnea, leg swelling and PND.       Doesn't remember when she used nitro last   Gastrointestinal: Positive for nausea, vomiting, abdominal pain and blood in stool (guiac reported + in ER.  Pt unaware of prior blood in stool).  Genitourinary: Negative.   Musculoskeletal: Positive for back pain (chronic back pain). Negative for myalgias.       Leg get weak with ambulation.  Skin: Negative.   Neurological: Negative.   Endo/Heme/Allergies:       ON plavix and asprin  Psychiatric/Behavioral: Negative.    Blood pressure 159/86, pulse 91, temperature 99.7 F (37.6 C), temperature source Oral, resp. rate 16, SpO2 97.00%. Physical Exam  Constitutional: She is oriented to person, place, and time. She appears well-developed and well-nourished.  Cardiovascular: Normal rate, regular rhythm and normal heart sounds.        Bilateral femoral pulses, Feet are warm, but difficult to feel pulses.  Respiratory: Effort normal and breath sounds normal.  GI: Soft. Bowel sounds are normal. She exhibits no distension and no mass. There is no tenderness. There is no rebound and no guarding.       Aortic bruits.  Musculoskeletal: She exhibits no edema and no tenderness.  Neurological: She is alert and oriented to person, place, and time. She has normal reflexes. No cranial nerve deficit.  Skin: Skin is warm and dry. No rash noted. No erythema. No pallor.  Psychiatric: She has a normal mood and affect. Her behavior is normal. Judgment and thought content normal.    Assessment/Plan: 1. Abdominal pain with colitis on CT. Infectious vs. Inflammatory, vs, ischemic. 2. Coronary artery disease with stent last cardiac cath 2011 with 90% obtuse marginal, not restented. 3. Peripheral vascular occlusive disease with a left femoropopliteal bypass graft . Possible occlusion on CT, asymptomatic. 4. Hypertension 5. Hyperlipidemia 6. History  of renal artery stenosis. 7. History of degenerative disc disease chronic back pain with L2-L5 decompression. Neurogenic claudication. 8. History of depression, patient currently denies not taking any medications for this. 9.Hyperparathyroidism Hx, CA 12.7. 10.Glaucoma. Plan: Patient is currently on antibiotics, she is pain free. She had a bowel movement earlier although it was reported guaiac positive. I would continue to monitor her.  Recheck labs in the morning, keep her n.p.o. for now. Monitor hemoglobin and hematocrit. If she develops recurrent abdominal pain and distention, would consider vascular consult to rule out ischemia. She does not give a history of abdominal ischemia or angina. We will follow with you. If she becomes more symptomatic she will need Cardiology consult. Will [facility] physician assistant for Dr. Judeth Horn. Cheo Selvey 04/10/2011, 2:40 PM

## 2011-04-11 DIAGNOSIS — K5289 Other specified noninfective gastroenteritis and colitis: Secondary | ICD-10-CM

## 2011-04-11 LAB — CBC
HCT: 35.9 % — ABNORMAL LOW (ref 36.0–46.0)
Hemoglobin: 11.8 g/dL — ABNORMAL LOW (ref 12.0–15.0)
MCH: 30.9 pg (ref 26.0–34.0)
MCH: 31.4 pg (ref 26.0–34.0)
MCHC: 32.5 g/dL (ref 30.0–36.0)
MCV: 95 fL (ref 78.0–100.0)
MCV: 95.5 fL (ref 78.0–100.0)
Platelets: 121 10*3/uL — ABNORMAL LOW (ref 150–400)
RBC: 3.82 MIL/uL — ABNORMAL LOW (ref 3.87–5.11)
RBC: 3.76 MIL/uL — ABNORMAL LOW (ref 3.87–5.11)

## 2011-04-11 LAB — DIFFERENTIAL
Basophils Relative: 0 % (ref 0–1)
Eosinophils Absolute: 0 10*3/uL (ref 0.0–0.7)
Eosinophils Relative: 0 % (ref 0–5)
Lymphs Abs: 2.2 10*3/uL (ref 0.7–4.0)
Neutrophils Relative %: 76 % (ref 43–77)

## 2011-04-11 LAB — COMPREHENSIVE METABOLIC PANEL
ALT: 13 U/L (ref 0–35)
AST: 21 U/L (ref 0–37)
Albumin: 3 g/dL — ABNORMAL LOW (ref 3.5–5.2)
Alkaline Phosphatase: 67 U/L (ref 39–117)
CO2: 23 mEq/L (ref 19–32)
Chloride: 109 mEq/L (ref 96–112)
GFR calc non Af Amer: 63 mL/min — ABNORMAL LOW (ref >90)
Potassium: 3.6 mEq/L (ref 3.5–5.1)
Sodium: 142 mEq/L (ref 135–145)
Total Bilirubin: 0.4 mg/dL (ref 0.3–1.2)

## 2011-04-11 LAB — CARDIAC PANEL(CRET KIN+CKTOT+MB+TROPI)
CK, MB: 3.1 ng/mL (ref 0.3–4.0)
Relative Index: 2.6 — ABNORMAL HIGH (ref 0.0–2.5)
Total CK: 126 U/L (ref 7–177)
Troponin I: <0.3 ng/mL (ref <0.30)

## 2011-04-11 LAB — URINE CULTURE: Culture  Setup Time: 201212261746

## 2011-04-11 MED ORDER — MORPHINE SULFATE 2 MG/ML IJ SOLN
2.0000 mg | INTRAMUSCULAR | Status: DC | PRN
  Administered 2011-04-11 (×2): 2 mg via INTRAVENOUS
  Filled 2011-04-11 (×2): qty 1

## 2011-04-11 MED ORDER — CLOPIDOGREL BISULFATE 75 MG PO TABS
75.0000 mg | ORAL_TABLET | Freq: Every day | ORAL | Status: DC
  Administered 2011-04-12 – 2011-04-16 (×5): 75 mg via ORAL
  Filled 2011-04-11 (×5): qty 1

## 2011-04-11 MED ORDER — CLOPIDOGREL BISULFATE 75 MG PO TABS: 75.0000 mg | ORAL_TABLET | Freq: Every day | ORAL | Status: DC

## 2011-04-11 NOTE — Progress Notes (Signed)
Subjective: Patient reports feeling improved since yesterday, she feels she can try clear liquid diet  Objective: Vital signs in last 24 hours: Filed Vitals:   04/10/11 1355 04/10/11 1804 04/10/11 2100 04/11/11 0500  BP: 138/80  108/64 127/65  Pulse: 83  99 100  Temp: 99.4 F (37.4 C)  100.9 F (38.3 C) 98.8 F (37.1 C)  TempSrc:   Oral Oral  Resp: 16  18 18   Height:  5\' 1"  (1.549 m)    Weight:  108 lb 0.4 oz (49 kg)    SpO2: 96%  98% 96%   Weight change:   Intake/Output Summary (Last 24 hours) at 04/11/11 1428 Last data filed at 04/11/11 1100  Gross per 24 hour  Intake    600 ml  Output    800 ml  Net   -200 ml   Physical Exam: General: elderly woman that appears younger than stated age resting in bed HEENT: PERRL, EOMI, no scleral icterus Cardiac: RRR, no rubs, murmurs or gallops Pulm: clear to auscultation bilaterally, moving normal volumes of air Abd: soft, nontender, nondistended, BS present, no suprapubic tenderness Ext: warm and well perfused, no pedal edema Neuro: alert and oriented X3, cranial nerves II-XII grossly intact  Lab Results: Basic Metabolic Panel:  Lab [id number] 04/10/11 1444  NA 142 140  K 3.6 4.1  CL 109 107  CO2 23 23  GLUCOSE 100* 108*  BUN 12 18  CREATININE 0.83 0.82  CALCIUM 10.4 11.1*  MG -- 1.6  PHOS -- --   Liver Function Tests:  Lab 04/11/11 0547 04/10/11 1444  AST 21 29  ALT 13 19  ALKPHOS 67 83  BILITOT 0.4 0.3  PROT 6.2 7.1  ALBUMIN 3.0* 3.5    Lab 04/10/11 1444  LIPASE 10*  AMYLASE --   CBC:  Lab 04/11/11 0547 04/10/11 0514  WBC 14.0* 13.0*  NEUTROABS 10.7* 11.1*  HGB 11.8* 13.1  HCT 36.3 40.8  MCV 95.0 96.9  PLT 121* 130*   Cardiac Enzymes:  Lab 04/11/11 0944 04/10/11 2322 04/10/11 1620  CKTOTAL 126 138 198*  CKMB 3.1 3.6 5.8*  CKMBINDEX -- -- --  TROPONINI <0.30 <0.30 <0.30   Coagulation:  Lab 04/10/11 1444  LABPROT 14.0  INR 1.06   Micro Results: No results found for this or any  previous visit (from the past 240 hour(s)). Studies/Results: Ct Abdomen Pelvis W Contrast  04/10/2011  *RADIOLOGY REPORT*  Clinical Data: Abdominal pain, nausea, leukocytosis  CT ABDOMEN AND PELVIS WITH CONTRAST  Technique:  Multidetector CT imaging of the abdomen and pelvis was performed following the standard protocol during bolus administration of intravenous contrast.   Sagittal and coronal MPR images reconstructed from axial data set.  Contrast:  95 ml Omnipaque-300 IV; Dilute oral contrast.  Comparison: 08/09/2003 gas  Findings: Dependent atelectasis bilateral lung bases. Atherosclerotic calcifications aorta and coronary arteries. Tiny nonspecific low attenuation foci in liver unchanged, question cyst. Cyst inferior pole left kidney, 2.5 x 2.4 cm image 31. Mild enlargement versus nodularity of left adrenal gland unchanged. Liver, spleen, pancreas, kidneys, and adrenal glands otherwise normal appearance. Marked thickening of transverse colon compatible with colitis. Stomach, small bowel loops and remaining colon unremarkable.  Appendix not definitely visualized. Bladder, ureters, uterus and adnexae unremarkable. No mass, adenopathy, free fluid or hernia. Surgical clips at right inguinal region from prior vascular surgery. Short segment of a femoral graft is identified, without internal contrast enhancement, question occluded. Severe multilevel degenerative disc and facet disease changes  of the thoracolumbar spine.  IMPRESSION: Colitis involving the transverse colon. Differential diagnosis includes infection, inflammatory bowel disease, and ischemia. No other significant intra-abdominal or intrapelvic abnormalities. Prior vascular graft at left inguinal region, question occluded, recommend clinical correlation.  Original Report Authenticated By: Burnetta Sabin, M.D.   Medications: I have reviewed the patient's current medications. Scheduled Meds:   . bimatoprost  1 drop Both Eyes QHS  . brimonidine  1  drop Both Eyes TID  . ciprofloxacin  400 mg Intravenous Q12H  . enoxaparin  40 mg Subcutaneous Daily  . metronidazole  500 mg Intravenous Q8H  .  morphine injection  1 mg Intravenous Once  . DISCONTD: brimonidine  1 drop Both Eyes TID  . DISCONTD: metoprolol  50 mg Oral Daily   Continuous Infusions:   . sodium chloride 100 mL/hr at 04/11/11 0519   PRN Meds:.acetaminophen, hydrALAZINE, morphine, ondansetron (ZOFRAN) IV Assessment/Plan: Principal Problem:  Abdominal pain- likely due to infectious colitis -pain improved on cipro/flagyl, still has elevated WBCs and low fever -patient passed blood streaked stool, will check repeat CBC this evening to ensure that she is not losing blood at a rapid rate -can advance diet tomorrow if tolerating clears Active Problems:  HYPERTENSION, BENIGN- well controlled on home metoprolol, can start ace-inhibitor tomorrow when is taking regular diet  CAD- will restart plavix tomorrow AM if no problems overnight  Urinary tract infection- treating with rocephin currently, can transition to PO ciprofloxacin tomorrow and follow up culture results   LOS: 1 day   [name] 04/11/2011, 2:28 PM

## 2011-04-11 NOTE — Progress Notes (Signed)
Subjective: Still having low grade fever, and abdominal discomfort.  Temp 100.4 now.  Objective: Vital signs in last 24 hours: Temp:  [98.8 F (37.1 C)-100.9 F (38.3 C)] 98.8 F (37.1 C) (12/27 0500) Pulse Rate:  [83-100] 100  (12/27 0500) Resp:  [16-18] 18  (12/27 0500) BP: (108-159)/(64-86) 127/65 mmHg (12/27 0500) SpO2:  [96 %-98 %] 96 % (12/27 0500) Weight:  [49 kg (108 lb 0.4 oz)] 108 lb 0.4 oz (49 kg) (12/26 1804) Last BM Date: 04/10/11  Intake/Output from previous day: 12/26 0701 - 12/27 0700 In: 1600 [I.V.:1300; IV Piggyback:300] Out: 600 [Urine:600] Intake/Output this shift:    General appearance: alert, cooperative and no distress GI: soft, still having discomfort, but not tender on exam, no distension.  Lab Results:   [facility] 04/11/11 0547 04/10/11 0514  WBC 14.0* 13.0*  HGB 11.8* 13.1  HCT 36.3 40.8  PLT 121* 130*    BMET  Basename 04/11/11 0547 04/10/11 1444  NA 142 140  K 3.6 4.1  CL 109 107  CO2 23 23  GLUCOSE 100* 108*  BUN 12 18  CREATININE 0.83 0.82  CALCIUM 10.4 11.1*   PT/INR  Basename 04/10/11 1444  LABPROT 14.0  INR 1.06     Studies/Results: Ct Abdomen Pelvis W Contrast  04/10/2011  *RADIOLOGY REPORT*  Clinical Data: Abdominal pain, nausea, leukocytosis  CT ABDOMEN AND PELVIS WITH CONTRAST  Technique:  Multidetector CT imaging of the abdomen and pelvis was performed following the standard protocol during bolus administration of intravenous contrast.   Sagittal and coronal MPR images reconstructed from axial data set.  Contrast:  95 ml Omnipaque-300 IV; Dilute oral contrast.  Comparison: 08/09/2003 gas  Findings: Dependent atelectasis bilateral lung bases. Atherosclerotic calcifications aorta and coronary arteries. Tiny nonspecific low attenuation foci in liver unchanged, question cyst. Cyst inferior pole left kidney, 2.5 x 2.4 cm image 31. Mild enlargement versus nodularity of left adrenal gland unchanged. Liver, spleen, pancreas,  kidneys, and adrenal glands otherwise normal appearance. Marked thickening of transverse colon compatible with colitis. Stomach, small bowel loops and remaining colon unremarkable.  Appendix not definitely visualized. Bladder, ureters, uterus and adnexae unremarkable. No mass, adenopathy, free fluid or hernia. Surgical clips at right inguinal region from prior vascular surgery. Short segment of a femoral graft is identified, without internal contrast enhancement, question occluded. Severe multilevel degenerative disc and facet disease changes of the thoracolumbar spine.  IMPRESSION: Colitis involving the transverse colon. Differential diagnosis includes infection, inflammatory bowel disease, and ischemia. No other significant intra-abdominal or intrapelvic abnormalities. Prior vascular graft at left inguinal region, question occluded, recommend clinical correlation.  Original Report Authenticated By: Burnetta Sabin, M.D.    Anti-infectives: Anti-infectives     Start     Dose/Rate Route Frequency Ordered Stop   04/10/11 1500   ciprofloxacin (CIPRO) IVPB 400 mg        400 mg 200 mL/hr over 60 Minutes Intravenous Every 12 hours 04/10/11 1420     04/10/11 1500   metroNIDAZOLE (FLAGYL) IVPB 500 mg        500 mg 100 mL/hr over 60 Minutes Intravenous 3 times per day 04/10/11 1420     04/10/11 0430   ciprofloxacin (CIPRO) tablet 500 mg        500 mg Oral  Once 04/10/11 0421 04/10/11 0509         Current Facility-Administered Medications  Medication Dose Route Frequency Provider Last Rate Last Dose  . 0.9 %  sodium chloride infusion  Intravenous Continuous Amanjot Sidhu 100 mL/hr at 04/11/11 0519    . acetaminophen (TYLENOL) tablet 650 mg  650 mg Oral Q6H PRN Amanjot Sidhu      . bimatoprost (LUMIGAN) 0.03 % ophthalmic solution 1 drop  1 drop Both Eyes QHS Amanjot Sidhu   1 drop at 04/10/11 2204  . brimonidine (ALPHAGAN) 0.2 % ophthalmic solution 1 drop  1 drop Both Eyes TID Tomasita Morrow,  PHARMD   1 drop at 04/10/11 2204  . ciprofloxacin (CIPRO) IVPB 400 mg  400 mg Intravenous Q12H Amanjot Sidhu   400 mg at 04/10/11 2204  . enoxaparin (LOVENOX) injection 40 mg  40 mg Subcutaneous Daily Amanjot Sidhu   40 mg at 04/10/11 1506  . hydrALAZINE (APRESOLINE) injection 10 mg  10 mg Intravenous Q6H PRN Amanjot Sidhu      . metroNIDAZOLE (FLAGYL) IVPB 500 mg  500 mg Intravenous Q8H Amanjot Sidhu   500 mg at 04/11/11 0519  . morphine 2 MG/ML injection 1 mg  1 mg Intravenous Q4H PRN Amanjot Sidhu   1 mg at 04/10/11 2008  . morphine 2 MG/ML injection 1 mg  1 mg Intravenous Once M Shelly Kalia-Reynolds   1 mg at 04/10/11 1553  . ondansetron (ZOFRAN) injection 4 mg  4 mg Intravenous Q6H PRN Amanjot Sidhu      . DISCONTD: brimonidine (ALPHAGAN) 0.15 % ophthalmic solution 1 drop  1 drop Both Eyes TID Amanjot Sidhu      . DISCONTD: iohexol (OMNIPAQUE) 300 MG/ML solution 20 mL  20 mL Oral Q1 Hr x 2 Medication Radiologist      . DISCONTD: iohexol (OMNIPAQUE) 300 MG/ML solution 90 mL  90 mL Intravenous Once PRN Medication Radiologist      . DISCONTD: metoprolol (TOPROL-XL) 24 hr tablet 50 mg  50 mg Oral Daily M Shelly Kalia-Reynolds        Assessment/Plan COLITIS, with ongoing fever and elevated WBC. Patient Active Problem List  Diagnoses  . HYPERLIPIDEMIA TYPE IIB / III  . HYPERTENSION, BENIGN  . CAD, NATIVE VESSEL  . PVD  . Abdominal pain  . Urinary tract infection  . Colitis  . Depression   PLAN: Continue antibiotics and follow with you.  LOS: 1 day    [name] 04/11/2011

## 2011-04-11 NOTE — Progress Notes (Signed)
Patient with visible blood in stool this afternoon.  Dr. Marcello Moores paged, awaiting call back.  Will continue to monitor.  Sanda Linger

## 2011-04-11 NOTE — Progress Notes (Signed)
The patient has no abdominal pain for me now.  Diet to be advanced.  Good bowel sounds, no peritonitis  Kathryne Eriksson. Dahlia Bailiff, MD, [place] [phone] [facility] Surgery

## 2011-04-11 NOTE — H&P (Signed)
Internal Medicine Teaching Service Attending Note Date: 04/11/2011  Patient name: [name]  Medical record number: [id number]  Date of birth: July 10, 1925   I have seen and evaluated [name] and discussed their care with the Residency Team.   [name] looks and feels markedly better this AM.  Physical Exam: Blood pressure 127/65, pulse 100, temperature 98.8 F (37.1 C), temperature source Oral, resp. rate 18, height 5\' 1"  (1.549 m), weight 108 lb 0.4 oz (49 kg), SpO2 96.00%. Lovely female in NAD Lungs-clear Heart-RRR, no extra sounds heard Abdom-+BS, really nontender to my exam this AM Extrem-no edema Neuro-non-focal  Lab results: Results for orders placed during the hospital encounter of 04/10/11 (from the past 24 hour(s))  COMPREHENSIVE METABOLIC PANEL     Status: Abnormal   Collection Time   04/10/11  2:44 PM      Component Value Range   Sodium 140  135 - 145 (mEq/L)   Potassium 4.1  3.5 - 5.1 (mEq/L)   Chloride 107  96 - 112 (mEq/L)   CO2 23  19 - 32 (mEq/L)   Glucose, Bld 108 (*) 70 - 99 (mg/dL)   BUN 18  6 - 23 (mg/dL)   Creatinine, Ser 0.82  0.50 - 1.10 (mg/dL)   Calcium 11.1 (*) 8.4 - 10.5 (mg/dL)   Total Protein 7.1  6.0 - 8.3 (g/dL)   Albumin 3.5  3.5 - 5.2 (g/dL)   AST 29  0 - 37 (U/L)   ALT 19  0 - 35 (U/L)   Alkaline Phosphatase 83  39 - 117 (U/L)   Total Bilirubin 0.3  0.3 - 1.2 (mg/dL)   GFR calc non Af Amer 64 (*) >90 (mL/min)   GFR calc Af Amer 75 (*) >90 (mL/min)  MAGNESIUM     Status: Normal   Collection Time   04/10/11  2:44 PM      Component Value Range   Magnesium 1.6  1.5 - 2.5 (mg/dL)  PROTIME-INR     Status: Normal   Collection Time   04/10/11  2:44 PM      Component Value Range   Prothrombin Time 14.0  11.6 - 15.2 (seconds)   INR 1.06  0.00 - 1.49   LIPASE, BLOOD     Status: Abnormal   Collection Time   04/10/11  2:44 PM      Component Value Range   Lipase 10 (*) 11 - 59 (U/L)  LACTIC ACID, PLASMA     Status: Normal   Collection Time   04/10/11  2:56 PM      Component Value Range   Lactic Acid, Venous 1.9  0.5 - 2.2 (mmol/L)  CARDIAC PANEL(CRET KIN+CKTOT+MB+TROPI)     Status: Abnormal   Collection Time   04/10/11  4:20 PM      Component Value Range   Total CK 198 (*) 7 - 177 (U/L)   CK, MB 5.8 (*) 0.3 - 4.0 (ng/mL)   Troponin I <0.30  <0.30 (ng/mL)   Relative Index 2.9 (*) 0.0 - 2.5   CARDIAC PANEL(CRET KIN+CKTOT+MB+TROPI)     Status: Abnormal   Collection Time   04/10/11 11:22 PM      Component Value Range   Total CK 138  7 - 177 (U/L)   CK, MB 3.6  0.3 - 4.0 (ng/mL)   Troponin I <0.30  <0.30 (ng/mL)   Relative Index 2.6 (*) 0.0 - 2.5   COMPREHENSIVE METABOLIC PANEL     Status:  Abnormal   Collection Time   04/11/11  5:47 AM      Component Value Range   Sodium 142  135 - 145 (mEq/L)   Potassium 3.6  3.5 - 5.1 (mEq/L)   Chloride 109  96 - 112 (mEq/L)   CO2 23  19 - 32 (mEq/L)   Glucose, Bld 100 (*) 70 - 99 (mg/dL)   BUN 12  6 - 23 (mg/dL)   Creatinine, Ser 0.83  0.50 - 1.10 (mg/dL)   Calcium 10.4  8.4 - 10.5 (mg/dL)   Total Protein 6.2  6.0 - 8.3 (g/dL)   Albumin 3.0 (*) 3.5 - 5.2 (g/dL)   AST 21  0 - 37 (U/L)   ALT 13  0 - 35 (U/L)   Alkaline Phosphatase 67  39 - 117 (U/L)   Total Bilirubin 0.4  0.3 - 1.2 (mg/dL)   GFR calc non Af Amer 63 (*) >90 (mL/min)   GFR calc Af Amer 74 (*) >90 (mL/min)  CBC     Status: Abnormal   Collection Time   04/11/11  5:47 AM      Component Value Range   WBC 14.0 (*) 4.0 - 10.5 (K/uL)   RBC 3.82 (*) 3.87 - 5.11 (MIL/uL)   Hemoglobin 11.8 (*) 12.0 - 15.0 (g/dL)   HCT 36.3  36.0 - 46.0 (%)   MCV 95.0  78.0 - 100.0 (fL)   MCH 30.9  26.0 - 34.0 (pg)   MCHC 32.5  30.0 - 36.0 (g/dL)   RDW 13.0  11.5 - 15.5 (%)   Platelets 121 (*) 150 - 400 (K/uL)  DIFFERENTIAL     Status: Abnormal   Collection Time   04/11/11  5:47 AM      Component Value Range   Neutrophils Relative 76  43 - 77 (%)   Neutro Abs 10.7 (*) 1.7 - 7.7 (K/uL)   Lymphocytes Relative 15   12 - 46 (%)   Lymphs Abs 2.2  0.7 - 4.0 (K/uL)   Monocytes Relative 8  3 - 12 (%)   Monocytes Absolute 1.2 (*) 0.1 - 1.0 (K/uL)   Eosinophils Relative 0  0 - 5 (%)   Eosinophils Absolute 0.0  0.0 - 0.7 (K/uL)   Basophils Relative 0  0 - 1 (%)   Basophils Absolute 0.0  0.0 - 0.1 (K/uL)    Imaging results:  Ct Abdomen Pelvis W Contrast  04/10/2011  *RADIOLOGY REPORT*  Clinical Data: Abdominal pain, nausea, leukocytosis  CT ABDOMEN AND PELVIS WITH CONTRAST  Technique:  Multidetector CT imaging of the abdomen and pelvis was performed following the standard protocol during bolus administration of intravenous contrast.   Sagittal and coronal MPR images reconstructed from axial data set.  Contrast:  95 ml Omnipaque-300 IV; Dilute oral contrast.  Comparison: 08/09/2003 gas  Findings: Dependent atelectasis bilateral lung bases. Atherosclerotic calcifications aorta and coronary arteries. Tiny nonspecific low attenuation foci in liver unchanged, question cyst. Cyst inferior pole left kidney, 2.5 x 2.4 cm image 31. Mild enlargement versus nodularity of left adrenal gland unchanged. Liver, spleen, pancreas, kidneys, and adrenal glands otherwise normal appearance. Marked thickening of transverse colon compatible with colitis. Stomach, small bowel loops and remaining colon unremarkable.  Appendix not definitely visualized. Bladder, ureters, uterus and adnexae unremarkable. No mass, adenopathy, free fluid or hernia. Surgical clips at right inguinal region from prior vascular surgery. Short segment of a femoral graft is identified, without internal contrast enhancement, question occluded. Severe multilevel degenerative disc and  facet disease changes of the thoracolumbar spine.  IMPRESSION: Colitis involving the transverse colon. Differential diagnosis includes infection, inflammatory bowel disease, and ischemia. No other significant intra-abdominal or intrapelvic abnormalities. Prior vascular graft at left inguinal  region, question occluded, recommend clinical correlation.  Original Report Authenticated By: Burnetta Sabin, M.D.    Assessment and Plan: I agree with the formulated Assessment and Plan with the following changes:  Patient is markedly improved this AM, and virtually non-tender to my exam. Will advance to clear liquids and see how she tolerates. Continue IV antibiotics, IVF for right now. I appreciate the surgeon's help in seeing this patient.

## 2011-04-12 LAB — LACTIC ACID, PLASMA: Lactic Acid, Venous: 1.3 mmol/L (ref 0.5–2.2)

## 2011-04-12 LAB — BASIC METABOLIC PANEL
BUN: 8 mg/dL (ref 6–23)
Chloride: 108 mEq/L (ref 96–112)
Creatinine, Ser: 0.74 mg/dL (ref 0.50–1.10)
GFR calc Af Amer: 89 mL/min — ABNORMAL LOW (ref >90)
GFR calc non Af Amer: 77 mL/min — ABNORMAL LOW (ref >90)

## 2011-04-12 LAB — CBC
HCT: 35 % — ABNORMAL LOW (ref 36.0–46.0)
MCH: 31.1 pg (ref 26.0–34.0)
MCHC: 32.6 g/dL (ref 30.0–36.0)
MCV: 95.4 fL (ref 78.0–100.0)
RDW: 13 % (ref 11.5–15.5)

## 2011-04-12 MED ORDER — CLOPIDOGREL BISULFATE 75 MG PO TABS: 75.0000 mg | ORAL_TABLET | Freq: Every day | ORAL | Status: DC

## 2011-04-12 MED ORDER — POTASSIUM CHLORIDE CRYS ER 20 MEQ PO TBCR
40.0000 meq | EXTENDED_RELEASE_TABLET | Freq: Once | ORAL | Status: AC
  Administered 2011-04-12: 40 meq via ORAL
  Filled 2011-04-12: qty 2

## 2011-04-12 NOTE — Progress Notes (Signed)
The patients's grand-daughter says that the patient has not had anything for pain today.  Had slightly bloody bowel movement this morning, but no pain.  WBC is slightly up from yesterday.  She needs a GI consultation to help [place] characterize her "colitis"  Also, if her stools are loose may consider C.diff titer.   Kathryne Eriksson. Dahlia Bailiff, MD, [place] [phone] [facility] Surgery

## 2011-04-12 NOTE — Progress Notes (Signed)
04-12-11 UR completed. Adysen Raphael RN BSN  

## 2011-04-12 NOTE — Progress Notes (Signed)
Subjective: No big improvement.  She says she feels some better, but abd still sore LLQ is where she points to.+BM, and she said it was "pinkish,"  H/H is stable.  WBC still 16K   Objective: Vital signs in last 24 hours: Temp:  [97.7 F (36.5 C)-100.2 F (37.9 C)] 100.1 F (37.8 C) (12/28 0500) Pulse Rate:  [90-93] 91  (12/28 0500) Resp:  [18-20] 18  (12/28 0500) BP: (119-135)/(64-72) 119/64 mmHg (12/28 0500) SpO2:  [97 %-98 %] 98 % (12/28 0500) Last BM Date: 04/11/11  Intake/Output from previous day: 12/27 0701 - 12/28 0700 In: 4188.3 [P.O.:400; I.V.:2688.3; IV Piggyback:1100] Out: 401 [Urine:400; Stool:1] Intake/Output this shift:    General appearance: alert, cooperative and no distress GI: soft, still having discomfort, but not tender on exam, no distension. C/O pain in LLQ.  Lab Results:   [facility] 04/12/11 0650 04/11/11 1435  WBC 16.4* 16.0*  HGB 11.4* 11.8*  HCT 35.0* 35.9*  PLT 114* 120*    BMET  Basename 04/12/11 0650 04/11/11 0547  NA 140 142  K 3.3* 3.6  CL 108 109  CO2 23 23  GLUCOSE 106* 100*  BUN 8 12  CREATININE 0.74 0.83  CALCIUM 10.2 10.4   PT/INR  Basename 04/10/11 1444  LABPROT 14.0  INR 1.06     Studies/Results: No results found.  Anti-infectives: Anti-infectives     Start     Dose/Rate Route Frequency Ordered Stop   04/10/11 1500   ciprofloxacin (CIPRO) IVPB 400 mg        400 mg 200 mL/hr over 60 Minutes Intravenous Every 12 hours 04/10/11 1420     04/10/11 1500   metroNIDAZOLE (FLAGYL) IVPB 500 mg        500 mg 100 mL/hr over 60 Minutes Intravenous 3 times per day 04/10/11 1420     04/10/11 0430   ciprofloxacin (CIPRO) tablet 500 mg        500 mg Oral  Once 04/10/11 0421 04/10/11 0509         Current Facility-Administered Medications  Medication Dose Route Frequency Provider Last Rate Last Dose  . 0.9 %  sodium chloride infusion   Intravenous Continuous Hans Eden, MD 50 mL/hr at 04/11/11 2202    .  acetaminophen (TYLENOL) tablet 650 mg  650 mg Oral Q6H PRN Amanjot Sidhu      . bimatoprost (LUMIGAN) 0.03 % ophthalmic solution 1 drop  1 drop Both Eyes QHS Amanjot Sidhu   1 drop at 04/11/11 2201  . brimonidine (ALPHAGAN) 0.2 % ophthalmic solution 1 drop  1 drop Both Eyes TID Tomasita Morrow, PHARMD   1 drop at 04/12/11 0949  . ciprofloxacin (CIPRO) IVPB 400 mg  400 mg Intravenous Q12H Amanjot Sidhu   400 mg at 04/12/11 0949  . clopidogrel (PLAVIX) tablet 75 mg  75 mg Oral Q breakfast Hans Eden, MD   75 mg at 04/12/11 0948  . enoxaparin (LOVENOX) injection 40 mg  40 mg Subcutaneous Daily Amanjot Sidhu   40 mg at 04/12/11 0949  . hydrALAZINE (APRESOLINE) injection 10 mg  10 mg Intravenous Q6H PRN Amanjot Sidhu      . metroNIDAZOLE (FLAGYL) IVPB 500 mg  500 mg Intravenous Q8H Amanjot Sidhu   500 mg at 04/12/11 0617  . morphine 2 MG/ML injection 2-4 mg  2-4 mg Intravenous Q4H PRN Augustin Coupe, MD   2 mg at 04/11/11 2208  . ondansetron (ZOFRAN) injection 4 mg  4 mg Intravenous Q6H PRN Amanjot  Sidhu      . DISCONTD: clopidogrel (PLAVIX) tablet 75 mg  75 mg Oral Daily Hans Eden, MD      . DISCONTD: clopidogrel (PLAVIX) tablet 75 mg  75 mg Oral Q breakfast Elnora Morrison, MD      . DISCONTD: morphine 2 MG/ML injection 1 mg  1 mg Intravenous Q4H PRN Amanjot Sidhu   1 mg at 04/11/11 1547    Assessment/Plan COLITIS, with ongoing fever and elevated WBC. NO significant improvement after 48 hours of antibiotics. Patient Active Problem List  Diagnoses  . HYPERLIPIDEMIA TYPE IIB / III  . HYPERTENSION, BENIGN  . CAD, NATIVE VESSEL  . PVD  . Abdominal pain  . Urinary tract infection  . Colitis  . Depression   PLAN: I will discuss with DR. Wyatt.  LOS: 2 days    [name] 04/12/2011

## 2011-04-12 NOTE — Progress Notes (Signed)
Internal Medicine Teaching Service Attending Note Date: 04/12/2011  Patient name: [name]  Medical record number: [id number]  Date of birth: November 10, 1925    This patient has been seen and discussed with the house staff. Please see their note for complete details. I concur with their findings with the following additions/corrections:  Patient felt worse after she had jello yesterday. Low grade fever and elevated wbc continue. Lactic acid is down to 1.3 which is encouraging. Exam is benign. Will continue just ice chips again today and follow cbc. OOB today.  [place] E 04/12/2011, 2:54 PM

## 2011-04-12 NOTE — Progress Notes (Signed)
Subjective: The patient notes increased pain this morning, after eating a clear liquid diet yesterday.  She notes no nausea/vomiting.  Her last bowel movement was 2 days ago.  No acute events overnight.  Objective: Vital signs in last 24 hours: Filed Vitals:   04/11/11 0500 04/11/11 1400 04/11/11 2045 04/12/11 0500  BP: 127/65 134/69 135/72 119/64  Pulse: 100 90 93 91  Temp: 98.8 F (37.1 C) 97.7 F (36.5 C) 100.2 F (37.9 C) 100.1 F (37.8 C)  TempSrc: Oral Oral Oral Oral  Resp: 18 20 18 18   Height:      Weight:      SpO2: 96% 98% 97% 98%   Weight change:   Intake/Output Summary (Last 24 hours) at 04/12/11 1136 Last data filed at 04/12/11 0617  Gross per 24 hour  Intake 4188.33 ml  Output    201 ml  Net 3987.33 ml   Physical Exam: General: alert, cooperative, and in no apparent distress HEENT: pupils equal round and reactive to light, vision grossly intact, oropharynx clear and non-erythematous  Neck: supple, no lymphadenopathy, no JVD Lungs: clear to ascultation bilaterally, normal work of respiration, no wheezes, rales, ronchi Heart: regular rate and rhythm, no murmurs, gallops, or rubs Abdomen: soft, moderately tender to palpation of left lower quadrant, non-distended, bowel sounds normoactive, no guarding, no rebound tenderness Extremities: no cyanosis, clubbing, or edema Neurologic: alert & oriented X3, cranial nerves II-XII intact, strength grossly intact, sensation intact to light touch  Lab Results: Basic Metabolic Panel:  Lab [id number] 04/11/11 0547 04/10/11 1444  NA 140 142 --  K 3.3* 3.6 --  CL 108 109 --  CO2 23 23 --  GLUCOSE 106* 100* --  BUN 8 12 --  CREATININE 0.74 0.83 --  CALCIUM 10.2 10.4 --  MG -- -- 1.6  PHOS -- -- --   Liver Function Tests:  Lab 04/11/11 0547 04/10/11 1444  AST 21 29  ALT 13 19  ALKPHOS 67 83  BILITOT 0.4 0.3  PROT 6.2 7.1  ALBUMIN 3.0* 3.5   CBC:  Lab 04/12/11 0650 04/11/11 1435 04/11/11 0547 04/10/11 0514    WBC 16.4* 16.0* -- --  NEUTROABS -- -- 10.7* 11.1*  HGB 11.4* 11.8* -- --  HCT 35.0* 35.9* -- --  MCV 95.4 95.5 -- --  PLT 114* 120* -- --   Urine Culture: negative  Medications:  Scheduled Meds:   . bimatoprost  1 drop Both Eyes QHS  . brimonidine  1 drop Both Eyes TID  . ciprofloxacin  400 mg Intravenous Q12H  . clopidogrel  75 mg Oral Q breakfast  . enoxaparin  40 mg Subcutaneous Daily  . metronidazole  500 mg Intravenous Q8H  . DISCONTD: clopidogrel  75 mg Oral Daily  . DISCONTD: clopidogrel  75 mg Oral Q breakfast   Continuous Infusions:   . sodium chloride 50 mL/hr at 04/11/11 2202   PRN Meds:.acetaminophen, hydrALAZINE, morphine, ondansetron (ZOFRAN) IV, DISCONTD: morphine  Assessment/Plan: The patient is an 77 yo woman with a history of significant vascular disease (CAD, PVD), HTN, and hyperparathyroidism, presenting with abdominal pain, worse today after a clear liquid diet.  1. Abdominal Pain - patient presents with abdominal pain and bloody streaked stool (stable cbc's), with CT evidence of colitis, and uptrending leukocytosis, concerning for ischemic colitis (given history of vascular disease, blood streaked stool, etc.) vs infectious colitis.  Pain had improved yesterday, but has worsened today after a clear liquid diet. -change diet back to NPO -  drawing lactic acid level to evaluate for ischemia -continue IV cipro, flagyl -drawing blood cultures given uptrending WBC count and elevated temperatures (tmax = 100.2 today)  2. ?Urinary tract infection - patient presents with suprapubic pain, with UA showing negative nitrites, trace LE, 7-10 WBC's, negative culture.  Given evidence of colitis as an explanation for the patient's pain, patient likely does not have UTI.  However, today is day 3 of cipro, which would adequately treat a UTI.  3. Hypertension - well-controlled -continue metoprolol -continue to hold lisinopril  4.  Coronary Artery Disease - chronic,  stable -re-start plavix  5. Prophy - Lovenox   LOS: 2 days   Elnora Morrison 04/12/2011, 11:36 AM

## 2011-04-13 ENCOUNTER — Encounter (HOSPITAL_COMMUNITY): Payer: Self-pay | Admitting: Gastroenterology

## 2011-04-13 DIAGNOSIS — K6819 Other retroperitoneal abscess: Secondary | ICD-10-CM

## 2011-04-13 LAB — CBC
MCH: 31.4 pg (ref 26.0–34.0)
MCHC: 32.8 g/dL (ref 30.0–36.0)
MCV: 95.7 fL (ref 78.0–100.0)
Platelets: 121 10*3/uL — ABNORMAL LOW (ref 150–400)
RDW: 13 % (ref 11.5–15.5)
WBC: 12.3 10*3/uL — ABNORMAL HIGH (ref 4.0–10.5)

## 2011-04-13 LAB — DIFFERENTIAL
Basophils Absolute: 0 10*3/uL (ref 0.0–0.1)
Basophils Relative: 0 % (ref 0–1)
Eosinophils Absolute: 0.1 10*3/uL (ref 0.0–0.7)
Eosinophils Relative: 1 % (ref 0–5)

## 2011-04-13 LAB — BASIC METABOLIC PANEL
CO2: 22 mEq/L (ref 19–32)
Calcium: 10.3 mg/dL (ref 8.4–10.5)
Creatinine, Ser: 0.79 mg/dL (ref 0.50–1.10)
Glucose, Bld: 89 mg/dL (ref 70–99)

## 2011-04-13 NOTE — Progress Notes (Signed)
Patient ID: [name], female   DOB: [birth year], 77 y.o.   MRN: [id number]    Subjective: Patient reports abdominal pain less.  +flatus  Objective: Vital signs in last 24 hours: Temp:  [98.6 F (37 C)-98.9 F (37.2 C)] 98.6 F (37 C) (12/29 0538) Pulse Rate:  [70-86] 70  (12/29 0538) Resp:  [14-17] 16  (12/29 0538) BP: (107-130)/(67-76) 128/70 mmHg (12/29 0538) SpO2:  [97 %-100 %] 98 % (12/29 0538) Last BM Date: 04/12/11  Intake/Output this shift:    Physical Exam: BP 128/70  Pulse 70  Temp(Src) 98.6 F (37 C) (Oral)  Resp 16  Ht 5\' 1"  (1.549 m)  Wt 108 lb 0.4 oz (49 kg)  BMI 20.41 kg/m2  SpO2 98% Abdomen soft, much less tenderness and guarding in LLQ  Labs: CBC  Basename 04/13/11 0700 04/12/11 0650  WBC 12.3* 16.4*  HGB 11.0* 11.4*  HCT 33.5* 35.0*  PLT 121* 114*   BMET  Basename 04/13/11 0700 04/12/11 0650  NA 141 140  K 3.6 3.3*  CL 111 108  CO2 22 23  GLUCOSE 89 106*  BUN 7 8  CREATININE 0.79 0.74  CALCIUM 10.3 10.2   LFT  Basename 04/11/11 0547 04/10/11 1444  PROT 6.2 --  ALBUMIN 3.0* --  AST 21 --  ALT 13 --  ALKPHOS 67 --  BILITOT 0.4 --  BILIDIR -- --  IBILI -- --  LIPASE -- 10*   PT/INR  Basename 04/10/11 1444  LABPROT 14.0  INR 1.06   ABG No results found for this basename: PHART:2,PCO2:2,PO2:2,HCO3:2 in the last 72 hours  Studies/Results: No results found.  Assessment: Principal Problem:  *Abdominal pain Active Problems:  HYPERTENSION, BENIGN  PVD  Urinary tract infection  Colitis     Plan: Patient slowly improving.  WBC improved.  Still with low grade temps. Ok to start clear liquids from surgery standpoint  LOS: 3 days    [name] A 04/13/2011

## 2011-04-13 NOTE — Consult Note (Signed)
Reason for Consult: Abdominal pain abnormal CAT scan Referring Physician: Medical team  [name] is an 77 y.o. female.  HPI: The patient has an essentially negative GI history with no previous colonoscopy or other GI procedures and other than when she was on calcium in the past having a little constipation she's had no GI symptoms and her family history is negative from a GI standpoint. 3 days ago she began having abdominal pain without obvious bleeding and came to the hospital where her white count was elevated and a CT showed transverse colitis and with her long history of vascular problems it was felt probably due to ischemia and surgery was consulted but they did not feel any acute intervention was needed and we are consulted for further workup and plans. Currently she has had some clear liquids today and feels better with less pain and has not seen any blood with her bowel movement today  Past Medical History  Diagnosis Date  . CAD (coronary artery disease) 2003, 2010    s/p stenting of RCA x2, Last cardiac cath in 2011 with 90% occlusion in obtuse marginal but too unstable to stent, with no stenting, last Echo 2010 showing normal EF 60-65% with grade 2 diastolic dysfunction   . Hyperlipidemia   . HTN (hypertension)   . PVD (peripheral vascular disease)     s/p left fem-pop bypass graft  . Hyperparathyroidism 2003    parathyroid adenoma localized to the right inferior thyroid lobe region  . Renal artery stenosis     left, 40%  . Osteoporosis 2009    diagnosed by bone density scan in 2009, on Vit D only, no bisphosphonate therapy  . Bacteremia 2003    hx of staph bacteremia  . Degenerative disc disease   . Lumbar stenosis with neurogenic claudication 2006    s/p decompression L2-L5  . Abscess 2003    I&D SCM abscess  . Arthritis   . History of medication noncompliance     concerning Plavix  . Depression   . Phlebitis     LLE  . Angina   . Myocardial infarction 2010; 2011    . Shortness of breath on exertion   . Hypothyroidism     Past Surgical History  Procedure Date  . Appendectomy   . Femoral bypass before 2003    LLE  . Incision and drainage anterior neck 10/2001    absces sternoclavicular joint  . Coronary angioplasty with stent placement 09/2008  . Coronary angioplasty with stent placement 04/2001  . Back surgery 06/2004    L3, L4 lami; L2, L5 hemilami; decompresion L2-3, 3-4, 4-5    Family History  Problem Relation Age of Onset  . Stroke Mother   . Stroke Father     Social History:  reports that she has never smoked. She has never used smokeless tobacco. She reports that she does not drink alcohol or use illicit drugs.  Allergies:  Allergies  Allergen Reactions  . Diphenhydramine Hcl Other (See Comments)    unknown  . Penicillins Other (See Comments)    unknown    Medications: I have reviewed the patient's current medications.  Results for orders placed during the hospital encounter of 04/10/11 (from the past 48 hour(s))  CBC     Status: Abnormal   Collection Time   04/11/11  2:35 PM      Component Value Range Comment   WBC 16.0 (*) 4.0 - 10.5 (K/uL)    RBC 3.76 (*)  3.87 - 5.11 (MIL/uL)    Hemoglobin 11.8 (*) 12.0 - 15.0 (g/dL)    HCT 35.9 (*) 36.0 - 46.0 (%)    MCV 95.5  78.0 - 100.0 (fL)    MCH 31.4  26.0 - 34.0 (pg)    MCHC 32.9  30.0 - 36.0 (g/dL)    RDW 12.9  11.5 - 15.5 (%)    Platelets 120 (*) 150 - 400 (K/uL)   CBC     Status: Abnormal   Collection Time   04/12/11  6:50 AM      Component Value Range Comment   WBC 16.4 (*) 4.0 - 10.5 (K/uL)    RBC 3.67 (*) 3.87 - 5.11 (MIL/uL)    Hemoglobin 11.4 (*) 12.0 - 15.0 (g/dL)    HCT 35.0 (*) 36.0 - 46.0 (%)    MCV 95.4  78.0 - 100.0 (fL)    MCH 31.1  26.0 - 34.0 (pg)    MCHC 32.6  30.0 - 36.0 (g/dL)    RDW 13.0  11.5 - 15.5 (%)    Platelets 114 (*) 150 - 400 (K/uL) PLATELET COUNT CONFIRMED BY SMEAR  BASIC METABOLIC PANEL     Status: Abnormal   Collection Time    04/12/11  6:50 AM      Component Value Range Comment   Sodium 140  135 - 145 (mEq/L)    Potassium 3.3 (*) 3.5 - 5.1 (mEq/L)    Chloride 108  96 - 112 (mEq/L)    CO2 23  19 - 32 (mEq/L)    Glucose, Bld 106 (*) 70 - 99 (mg/dL)    BUN 8  6 - 23 (mg/dL)    Creatinine, Ser 0.74  0.50 - 1.10 (mg/dL)    Calcium 10.2  8.4 - 10.5 (mg/dL)    GFR calc non Af Amer 77 (*) >90 (mL/min)    GFR calc Af Amer 89 (*) >90 (mL/min)   CULTURE, BLOOD (ROUTINE X 2)     Status: Normal (Preliminary result)   Collection Time   04/12/11 12:16 PM      Component Value Range Comment   Specimen Description BLOOD RIGHT ARM      Special Requests BOTTLES DRAWN AEROBIC AND ANAEROBIC 10CC      Setup Time [id number]      Culture        Value:        BLOOD CULTURE RECEIVED NO GROWTH TO DATE CULTURE WILL BE HELD FOR 5 DAYS BEFORE ISSUING A FINAL NEGATIVE REPORT   Report Status PENDING     CULTURE, BLOOD (ROUTINE X 2)     Status: Normal (Preliminary result)   Collection Time   04/12/11 12:33 PM      Component Value Range Comment   Specimen Description BLOOD LEFT ARM      Special Requests BOTTLES DRAWN AEROBIC ONLY 5CC      Setup Time [id number]      Culture        Value:        BLOOD CULTURE RECEIVED NO GROWTH TO DATE CULTURE WILL BE HELD FOR 5 DAYS BEFORE ISSUING A FINAL NEGATIVE REPORT   Report Status PENDING     LACTIC ACID, PLASMA     Status: Normal   Collection Time   04/12/11 12:36 PM      Component Value Range Comment   Lactic Acid, Venous 1.3  0.5 - 2.2 (mmol/L)   BASIC METABOLIC PANEL     Status: Abnormal  Collection Time   04/13/11  7:00 AM      Component Value Range Comment   Sodium 141  135 - 145 (mEq/L)    Potassium 3.6  3.5 - 5.1 (mEq/L)    Chloride 111  96 - 112 (mEq/L)    CO2 22  19 - 32 (mEq/L)    Glucose, Bld 89  70 - 99 (mg/dL)    BUN 7  6 - 23 (mg/dL)    Creatinine, Ser 0.79  0.50 - 1.10 (mg/dL)    Calcium 10.3  8.4 - 10.5 (mg/dL)    GFR calc non Af Amer 75 (*) >90 (mL/min)    GFR  calc Af Amer 87 (*) >90 (mL/min)   CBC     Status: Abnormal   Collection Time   04/13/11  7:00 AM      Component Value Range Comment   WBC 12.3 (*) 4.0 - 10.5 (K/uL)    RBC 3.50 (*) 3.87 - 5.11 (MIL/uL)    Hemoglobin 11.0 (*) 12.0 - 15.0 (g/dL)    HCT 33.5 (*) 36.0 - 46.0 (%)    MCV 95.7  78.0 - 100.0 (fL)    MCH 31.4  26.0 - 34.0 (pg)    MCHC 32.8  30.0 - 36.0 (g/dL)    RDW 13.0  11.5 - 15.5 (%)    Platelets 121 (*) 150 - 400 (K/uL)   DIFFERENTIAL     Status: Abnormal   Collection Time   04/13/11  7:00 AM      Component Value Range Comment   Neutrophils Relative 77  43 - 77 (%)    Neutro Abs 9.5 (*) 1.7 - 7.7 (K/uL)    Lymphocytes Relative 15  12 - 46 (%)    Lymphs Abs 1.9  0.7 - 4.0 (K/uL)    Monocytes Relative 7  3 - 12 (%)    Monocytes Absolute 0.9  0.1 - 1.0 (K/uL)    Eosinophils Relative 1  0 - 5 (%)    Eosinophils Absolute 0.1  0.0 - 0.7 (K/uL)    Basophils Relative 0  0 - 1 (%)    Basophils Absolute 0.0  0.0 - 0.1 (K/uL)     No results found.  ROS negative except above Blood pressure 128/70, pulse 70, temperature 98.6 F (37 C), temperature source Oral, resp. rate 16, height 5\' 1"  (1.549 m), weight 49 kg (108 lb 0.4 oz), SpO2 98.00%. Physical Exam no acute distress pleasant abdomen is soft nontender positive bowel sounds no pedal edema right better than left pulse labs and CT reviewed white count decreased Assessment/Plan: Believe patient has ischemic colitis and if doing better in a.m. consider advancing her diet. I discussed colonoscopy with her to prove the diagnosis which we could do on Monday if needed however if we want to try to advance her diet we could see how she does and proceed with a colonoscopy in the future if this recurs. One could also consider a CT and a or MRA at some point in the future as well but okay to wait on workup for a second attack and I am happy to see back when necessary [facility] E 04/13/2011, 2:28 PM

## 2011-04-13 NOTE — Progress Notes (Signed)
Subjective: Patient reports less abdominal pain this morning after returning to NPO status yesterday, though she does note increased fatigue, which she attributes to her poor PO intake recently.  She reports having one blood-streaked BM yesterday, but none today.  She continues to pass flatus.  She would like to try advancing her diet again.  Objective: Vital signs in last 24 hours: Filed Vitals:   04/12/11 0500 04/12/11 1400 04/12/11 2100 04/13/11 0538  BP: 119/64 107/67 130/76 128/70  Pulse: 91 86 76 70  Temp: 100.1 F (37.8 C) 98.8 F (37.1 C) 98.9 F (37.2 C) 98.6 F (37 C)  TempSrc: Oral Oral Oral Oral  Resp: 18 17 14 16   Height:      Weight:      SpO2: 98% 100% 97% 98%    Physical Exam: General: alert, cooperative, and in no apparent distress HEENT: pupils equal round and reactive to light, vision grossly intact, oropharynx clear and non-erythematous  Neck: supple, no lymphadenopathy, no JVD Lungs: clear to ascultation bilaterally, normal work of respiration, no wheezes, rales, ronchi Heart: regular rate and rhythm, no murmurs, gallops, or rubs Abdomen: soft, minimally tender to palpation of left lower quadrant, significantly improved since yesterday, non-distended, bowel sounds normoactive, no guarding, no rebound tenderness  Extremities: no cyanosis, clubbing, or edema Neurologic: alert & oriented X3, cranial nerves II-XII intact, strength grossly intact, sensation intact to light touch  Lab Results: Basic Metabolic Panel:  Lab [id number] 0700 04/12/11 0650 04/10/11 1444  NA 141 140 --  K 3.6 3.3* --  CL 111 108 --  CO2 22 23 --  GLUCOSE 89 106* --  BUN 7 8 --  CREATININE 0.79 0.74 --  CALCIUM 10.3 10.2 --  MG -- -- 1.6  PHOS -- -- --   CBC:  Lab 04/13/11 0700 04/12/11 0650 04/11/11 0547  WBC 12.3* 16.4* --  NEUTROABS 9.5* -- 10.7*  HGB 11.0* 11.4* --  HCT 33.5* 35.0* --  MCV 95.7 95.4 --  PLT 121* 114* --   Medications:  Scheduled Meds:   .  bimatoprost  1 drop Both Eyes QHS  . brimonidine  1 drop Both Eyes TID  . ciprofloxacin  400 mg Intravenous Q12H  . clopidogrel  75 mg Oral Q breakfast  . enoxaparin  40 mg Subcutaneous Daily  . metronidazole  500 mg Intravenous Q8H  . potassium chloride  40 mEq Oral Once  . DISCONTD: clopidogrel  75 mg Oral Q breakfast   Continuous Infusions:   . sodium chloride 50 mL/hr at 04/12/11 2224   PRN Meds:.acetaminophen, hydrALAZINE, morphine, ondansetron (ZOFRAN) IV  Assessment/Plan: The patient is an 77 yo woman with a history of significant vascular disease (CAD, PVD), HTN, and hyperparathyroidism, presenting with abdominal pain, improved today.  1. Abdominal Pain - patient presents with abdominal pain and blood streaked stool (stable cbc's), with CT evidence of colitis, initially uptrending leukocytosis (though decreased today), concerning for ischemic colitis (given history of vascular disease, blood streaked stool, etc.) vs infectious colitis. Pain had worsened yesterday after a clear liquid diet, but improved today after making the patient NPO. -appreciate surgery recs -requesting a GI consult today to further evaluate non-ischemic causes of colitis, since the diagnosis is still unclear at this point in the setting of leukocytosis (initially uptrending, though decreased today), continued pain with CLD as of yesterday, and CT results obtained. -will advance diet today to clear liquids -continue IV cipro, flagyl  -blood cultures pending  2. ?Urinary tract infection -  patient presents with suprapubic pain, with UA showing negative nitrites, trace LE, 7-10 WBC's, negative culture. Given evidence of colitis as an explanation for the patient's pain, patient likely does not have UTI. However, patient has completed 3 days of cipro.  3. Hypertension - well-controlled  -continue metoprolol  -continue to hold lisinopril   4. Coronary Artery Disease - chronic, stable  -plavix  5. Prophy -  Lovenox   LOS: 3 days   Elnora Morrison 04/13/2011, 11:18 AM

## 2011-04-14 LAB — BASIC METABOLIC PANEL
CO2: 23 mEq/L (ref 19–32)
Calcium: 10.7 mg/dL — ABNORMAL HIGH (ref 8.4–10.5)
Chloride: 110 mEq/L (ref 96–112)
GFR calc Af Amer: 76 mL/min — ABNORMAL LOW (ref >90)
Sodium: 139 mEq/L (ref 135–145)

## 2011-04-14 LAB — CBC
Platelets: 145 10*3/uL — ABNORMAL LOW (ref 150–400)
RDW: 12.7 % (ref 11.5–15.5)
WBC: 8.5 10*3/uL (ref 4.0–10.5)

## 2011-04-14 LAB — DIFFERENTIAL
Basophils Absolute: 0 10*3/uL (ref 0.0–0.1)
Basophils Relative: 0 % (ref 0–1)
Lymphocytes Relative: 21 % (ref 12–46)
Neutro Abs: 5.8 10*3/uL (ref 1.7–7.7)

## 2011-04-14 MED ORDER — METRONIDAZOLE 500 MG PO TABS
500.0000 mg | ORAL_TABLET | Freq: Three times a day (TID) | ORAL | Status: DC
  Administered 2011-04-14 – 2011-04-16 (×7): 500 mg via ORAL
  Filled 2011-04-14 (×9): qty 1

## 2011-04-14 MED ORDER — CIPROFLOXACIN HCL 500 MG PO TABS
500.0000 mg | ORAL_TABLET | Freq: Two times a day (BID) | ORAL | Status: DC
  Filled 2011-04-14 (×2): qty 1

## 2011-04-14 MED ORDER — ZOLPIDEM TARTRATE 5 MG PO TABS
5.0000 mg | ORAL_TABLET | Freq: Every evening | ORAL | Status: DC | PRN
  Administered 2011-04-14: 5 mg via ORAL
  Filled 2011-04-14: qty 1

## 2011-04-14 MED ORDER — CIPROFLOXACIN HCL 500 MG PO TABS
500.0000 mg | ORAL_TABLET | Freq: Two times a day (BID) | ORAL | Status: DC
  Administered 2011-04-14 – 2011-04-16 (×4): 500 mg via ORAL
  Filled 2011-04-14 (×5): qty 1

## 2011-04-14 NOTE — Progress Notes (Signed)
Subjective: The patient tolerated a clear liquid diet yesterday with no increase in abdominal pain.  Patient had a bowel movement this morning with no blood or melena.  Objective: Vital signs in last 24 hours: Filed Vitals:   04/13/11 0538 04/13/11 1300 04/13/11 2100 04/14/11 0530  BP: 128/70 154/90 146/75 116/72  Pulse: 70 70 87 77  Temp: 98.6 F (37 C) 98.2 F (36.8 C) 99.3 F (37.4 C) 99.7 F (37.6 C)  TempSrc: Oral Oral    Resp: 16 18 16 18   Height:      Weight:      SpO2: 98% 98% 96% 97%   Physical Exam: General: alert, cooperative, and in no apparent distress HEENT: pupils equal round and reactive to light, vision grossly intact, oropharynx clear and non-erythematous  Neck: supple, no lymphadenopathy, no JVD Lungs: clear to ascultation bilaterally, normal work of respiration, no wheezes, rales, ronchi Heart: regular rate and rhythm, no murmurs, gallops, or rubs Abdomen: soft, minimally tender to palpation of left lower quadrant, significantly improved since admission, non-distended, bowel sounds normoactive, no guarding, no rebound tenderness  Extremities: no cyanosis, clubbing, or edema Neurologic: alert & oriented X3, cranial nerves II-XII intact, strength grossly intact, sensation intact to light touch  Lab Results: Basic Metabolic Panel:  Lab [id number] 04/13/11 0700 04/10/11 1444  NA 139 141 --  K 3.5 3.6 --  CL 110 111 --  CO2 23 22 --  GLUCOSE 86 89 --  BUN 10 7 --  CREATININE 0.81 0.79 --  CALCIUM 10.7* 10.3 --  MG -- -- 1.6  PHOS -- -- --   CBC:  Lab 04/14/11 0613 04/13/11 0700  WBC 8.5 12.3*  NEUTROABS 5.8 9.5*  HGB 10.7* 11.0*  HCT 32.6* 33.5*  MCV 95.0 95.7  PLT 145* 121*   Medications:  Scheduled Meds:   . bimatoprost  1 drop Both Eyes QHS  . brimonidine  1 drop Both Eyes TID  . ciprofloxacin  400 mg Intravenous Q12H  . clopidogrel  75 mg Oral Q breakfast  . enoxaparin  40 mg Subcutaneous Daily  . metronidazole  500 mg Intravenous  Q8H   Continuous Infusions:   . sodium chloride 50 mL/hr at 04/12/11 2224   PRN Meds:.acetaminophen, hydrALAZINE, morphine, ondansetron (ZOFRAN) IV  Assessment/Plan: The patient is an 77 yo woman with a history of significant vascular disease (CAD, PVD), HTN, and hyperparathyroidism, presenting with abdominal pain, improved today.   1. Abdominal Pain - patient presents with abdominal pain and blood streaked stool (stable cbc's), with CT evidence of colitis, concerning for ischemic colitis (given history of vascular disease, blood streaked stool, etc.) vs infectious colitis. Now patient is tolerating clear liquid diet without pain, and with downtrending WBC count.  Patient appears to be clinically improving from an episode of ischemic colitis.  Per GI recs, we will advance diet today, but consider colonoscopy if patient unable to advance diet. -greatly appreciate GI consult and surgery consult -will advance diet today to full liquids  -consider colonoscopy if patient unable to tolerate advancing diet -change cipro, flagyl to oral now that patient is tolerating PO -blood cultures pending   2. ?Urinary tract infection - patient presents with suprapubic pain, with UA showing negative nitrites, trace LE, 7-10 WBC's, negative culture. Given evidence of colitis as an explanation for the patient's pain, patient likely does not have UTI. However, patient has completed 3 days of cipro.   3. Hypertension - well-controlled  -continue metoprolol  -continue to hold  lisinopril   4. Coronary Artery Disease - chronic, stable  -plavix   5. Prophy - Lovenox   LOS: 4 days   [name] 04/14/2011, 9:17 AM

## 2011-04-14 NOTE — Progress Notes (Signed)
  Subjective: No abd pain. No n/v. +bm this am. Tolerated clears without any increase in abd pain  Objective: Vital signs in last 24 hours: Temp:  [98.2 F (36.8 C)-99.7 F (37.6 C)] 99.7 F (37.6 C) (12/30 0530) Pulse Rate:  [70-87] 77  (12/30 0530) Resp:  [16-18] 18  (12/30 0530) BP: (116-154)/(72-90) 116/72 mmHg (12/30 0530) SpO2:  [96 %-98 %] 97 % (12/30 0530) Last BM Date: 04/13/11  Intake/Output from previous day:   Intake/Output this shift:    Alert, sitting on edge of bed abd soft, nt, nd  Lab Results:   [facility] 04/14/11 0613 04/13/11 0700  WBC 8.5 12.3*  HGB 10.7* 11.0*  HCT 32.6* 33.5*  PLT 145* 121*   BMET  Basename 04/14/11 0613 04/13/11 0700  NA 139 141  K 3.5 3.6  CL 110 111  CO2 23 22  GLUCOSE 86 89  BUN 10 7  CREATININE 0.81 0.79  CALCIUM 10.7* 10.3   PT/INR No results found for this basename: LABPROT:2,INR:2 in the last 72 hours ABG No results found for this basename: PHART:2,PCO2:2,PO2:2,HCO3:2 in the last 72 hours  Studies/Results: No results found.  Anti-infectives: Anti-infectives     Start     Dose/Rate Route Frequency Ordered Stop   04/14/11 2200   ciprofloxacin (CIPRO) tablet 500 mg        500 mg Oral Every 12 hours 04/14/11 0936     04/14/11 1400   metroNIDAZOLE (FLAGYL) tablet 500 mg        500 mg Oral 3 times per day 04/14/11 0927     04/14/11 1000   ciprofloxacin (CIPRO) tablet 500 mg  Status:  Discontinued        500 mg Oral Every 12 hours 04/14/11 0927 04/14/11 0936   04/10/11 1500   ciprofloxacin (CIPRO) IVPB 400 mg  Status:  Discontinued        400 mg 200 mL/hr over 60 Minutes Intravenous Every 12 hours 04/10/11 1420 04/14/11 0927   04/10/11 1500   metroNIDAZOLE (FLAGYL) IVPB 500 mg  Status:  Discontinued        500 mg 100 mL/hr over 60 Minutes Intravenous 3 times per day 04/10/11 1420 04/14/11 0927   04/10/11 0430   ciprofloxacin (CIPRO) tablet 500 mg        500 mg Oral  Once 04/10/11 0421 04/10/11 0509          Assessment/Plan: Colitis, likely ischemic Appears to be resolving, afebrile, wbc normal, tolerating diet  Adv diet to full Agree with transition to oral abx No need for surgery  Signing off. Call with questions.  Leighton Ruff. Redmond Pulling, MD, FACS General, Bariatric, & Minimally Invasive Surgery [facility], [place]     LOS: 4 days    Gayland Curry 04/14/2011

## 2011-04-14 NOTE — Progress Notes (Signed)
[name] 3:57 PM  Subjective: The patient is doing better and tolerating her diet without abdominal pain and may be just a touch of bright red blood in her stool but no new complaints  Objective: Vital signs stable afebrile no acute distress abdomen is soft nontender good bowel sounds white count back to normal hemoglobin minimal decrease  Assessment: Probable ischemic colitis which we discussed again with the patient and her family  Plan: Slowly advance diet probable discharge soon call us when necessary if this happens again would proceed with workup at that time and I discussed her risk factors of recurrence with her and her family and happy to see back as an outpatient as well  [place] E

## 2011-04-15 LAB — BASIC METABOLIC PANEL
CO2: 24 mEq/L (ref 19–32)
Chloride: 110 mEq/L (ref 96–112)
Creatinine, Ser: 0.8 mg/dL (ref 0.50–1.10)
GFR calc Af Amer: 77 mL/min — ABNORMAL LOW (ref >90)
Potassium: 3.6 mEq/L (ref 3.5–5.1)

## 2011-04-15 LAB — CBC
HCT: 32.2 % — ABNORMAL LOW (ref 36.0–46.0)
MCV: 93.9 fL (ref 78.0–100.0)
Platelets: 158 10*3/uL (ref 150–400)
RBC: 3.43 MIL/uL — ABNORMAL LOW (ref 3.87–5.11)
WBC: 7.8 10*3/uL (ref 4.0–10.5)

## 2011-04-15 NOTE — Progress Notes (Signed)
Internal Medicine Attending  Date: 04/15/2011  Patient name: [name] Medical record number: [id number] Date of birth: 11/25/1925 Age: 77 y.o. Gender: female   I saw and evaluated the patient. I reviewed the resident's note by Dr. Owens Shark and I agree with the resident's findings and plans as documented in his note.   Dr. Lynnae January will cover as attending physician on 04/16/2011, and Dr. Stann Mainland will take over as attending physician on January 2.

## 2011-04-15 NOTE — Progress Notes (Signed)
Subjective: The patient slept well overnight.  She was able to tolerate a full liquid diet yesterday with no abdominal pain.  She does note another bowel movement yesterday with a small amount of blood.  Objective: Vital signs in last 24 hours: Filed Vitals:   04/14/11 0530 04/14/11 1500 04/14/11 2100 04/15/11 0530  BP: 116/72 155/78 122/74 133/78  Pulse: 77 85 74 73  Temp: 99.7 F (37.6 C) 98 F (36.7 C) 98.3 F (36.8 C) 98.1 F (36.7 C)  TempSrc:  Oral    Resp: 18 20 14 14   Height:      Weight:      SpO2: 97% 97% 98% 96%   Weight change:   Intake/Output Summary (Last 24 hours) at 04/15/11 1300 Last data filed at 04/15/11 0800  Gross per 24 hour  Intake    520 ml  Output      0 ml  Net    520 ml   Physical Exam: General: alert, cooperative, and in no apparent distress HEENT: pupils equal round and reactive to light, vision grossly intact, oropharynx clear and non-erythematous  Neck: supple, no lymphadenopathy, no JVD Lungs: clear to ascultation bilaterally, normal work of respiration, no wheezes, rales, ronchi Heart: regular rate and rhythm, no murmurs, gallops, or rubs Abdomen: soft, non-tender to palpation of left lower quadrant, significantly improved since admission, non-distended, bowel sounds normoactive, no guarding, no rebound tenderness  Extremities: no cyanosis, clubbing, or edema Neurologic: alert & oriented X3, cranial nerves II-XII intact, strength grossly intact, sensation intact to light touch  Lab Results: Basic Metabolic Panel:  Lab [id number] 0500 04/14/11 0613 04/10/11 1444  NA 140 139 --  K 3.6 3.5 --  CL 110 110 --  CO2 24 23 --  GLUCOSE 90 86 --  BUN 9 10 --  CREATININE 0.80 0.81 --  CALCIUM 11.0* 10.7* --  MG -- -- 1.6  PHOS -- -- --   CBC:  Lab 04/15/11 0500 04/14/11 0613 04/13/11 0700  WBC 7.8 8.5 --  NEUTROABS -- 5.8 9.5*  HGB 10.6* 10.7* --  HCT 32.2* 32.6* --  MCV 93.9 95.0 --  PLT 158 145* --    Medications: I have reviewed  the patient's current medications. Scheduled Meds:   . bimatoprost  1 drop Both Eyes QHS  . brimonidine  1 drop Both Eyes TID  . ciprofloxacin  500 mg Oral Q12H  . clopidogrel  75 mg Oral Q breakfast  . enoxaparin  40 mg Subcutaneous Daily  . metroNIDAZOLE  500 mg Oral Q8H   Continuous Infusions:   . sodium chloride 50 mL/hr at 04/12/11 2224   PRN Meds:.acetaminophen, hydrALAZINE, morphine, ondansetron (ZOFRAN) IV, zolpidem  Assessment/Plan: The patient is an 77 yo woman with a history of significant vascular disease (CAD, PVD), HTN, and hyperparathyroidism, presenting with abdominal pain, improving.  1. Abdominal Pain - patient presents with abdominal pain and blood streaked stool (stable cbc's), with CT evidence of colitis, concerning for ischemic colitis (given history of vascular disease, blood streaked stool, etc.) vs infectious colitis. Now patient is tolerating full liquid diet without pain, and with downtrending WBC count. Patient appears to be clinically improving from an episode of ischemic colitis. Per GI recs, we will advance diet today, but consider colonoscopy if patient unable to advance diet.  -greatly appreciate GI consult and surgery consult  -will advance diet today to a regular diet -consider colonoscopy if patient unable to tolerate advancing diet  -cipro, flagyl PO  -blood cultures -  no growth to date  2. ?Urinary tract infection - patient presents with suprapubic pain, with UA showing negative nitrites, trace LE, 7-10 WBC's, negative culture. Given evidence of colitis as an explanation for the patient's pain, patient likely does not have UTI. However, patient has completed 3 days of cipro.   3. Hypertension - well-controlled  -continue metoprolol  -continue to hold lisinopril   4. Coronary Artery Disease - chronic, stable  -plavix   5. Prophy - Lovenox  6. Dispo - Likely discharge home tomorrow if patient tolerates regular diet today.   LOS: 5 days    [name] 04/15/2011, 1:00 PM

## 2011-04-16 ENCOUNTER — Encounter (HOSPITAL_COMMUNITY): Payer: Self-pay | Admitting: Internal Medicine

## 2011-04-16 LAB — BASIC METABOLIC PANEL
BUN: 15 mg/dL (ref 6–23)
Calcium: 11 mg/dL — ABNORMAL HIGH (ref 8.4–10.5)
Chloride: 112 mEq/L (ref 96–112)
Creatinine, Ser: 0.87 mg/dL (ref 0.50–1.10)
GFR calc Af Amer: 69 mL/min — ABNORMAL LOW (ref >90)
GFR calc non Af Amer: 60 mL/min — ABNORMAL LOW (ref >90)

## 2011-04-16 LAB — CBC
HCT: 31.6 % — ABNORMAL LOW (ref 36.0–46.0)
MCHC: 32.6 g/dL (ref 30.0–36.0)
Platelets: 166 10*3/uL (ref 150–400)
RDW: 12.9 % (ref 11.5–15.5)
WBC: 8.8 10*3/uL (ref 4.0–10.5)

## 2011-04-16 MED ORDER — SIMVASTATIN 40 MG PO TABS: 40.0000 mg | ORAL_TABLET | Freq: Every day | ORAL | Status: DC

## 2011-04-16 MED ORDER — BRIMONIDINE TARTRATE 0.15 % OP SOLN: 1.0000 [drp] | Freq: Two times a day (BID) | OPHTHALMIC | Status: DC

## 2011-04-16 MED ORDER — TRAMADOL HCL 50 MG PO TABS: 50.0000 mg | ORAL_TABLET | Freq: Two times a day (BID) | ORAL | Status: DC

## 2011-04-16 NOTE — Progress Notes (Signed)
Subjective: No acute events overnight.  The patient tolerated a regular diet with no abdominal pain.  No BM this morning, some blood streaks in BM yesterday.  Hemoglobin stable.  Objective: Vital signs in last 24 hours: Filed Vitals:   04/15/11 0530 04/15/11 1335 04/15/11 2200 04/16/11 0500  BP: 133/78 115/71 104/67 118/74  Pulse: 73 87 78 77  Temp: 98.1 F (36.7 C) 98.9 F (37.2 C) 99.3 F (37.4 C) 98.6 F (37 C)  TempSrc:  Oral    Resp: 14 18 18 18   Height:      Weight:    115 lb 9.6 oz (52.436 kg)  SpO2: 96% 98% 96% 98%   Weight change:   Intake/Output Summary (Last 24 hours) at 04/16/11 1039 Last data filed at 04/16/11 0600  Gross per 24 hour  Intake   1600 ml  Output      8 ml  Net   1592 ml   Physical Exam: General: alert, cooperative, and in no apparent distress HEENT: pupils equal round and reactive to light, vision grossly intact, oropharynx clear and non-erythematous  Neck: supple, no lymphadenopathy, no JVD Lungs: clear to ascultation bilaterally, normal work of respiration, no wheezes, rales, ronchi Heart: regular rate and rhythm, no murmurs, gallops, or rubs Abdomen: soft, non-tender to palpation of left lower quadrant, non-distended, bowel sounds normoactive, no guarding, no rebound tenderness  Extremities: no cyanosis, clubbing, or edema Neurologic: alert & oriented X3, cranial nerves II-XII intact, strength grossly intact, sensation intact to light touch  Lab Results: Basic Metabolic Panel:  Lab [id number] 0530 04/15/11 0500 04/10/11 1444  NA 142 140 --  K 3.8 3.6 --  CL 112 110 --  CO2 23 24 --  GLUCOSE 93 90 --  BUN 15 9 --  CREATININE 0.87 0.80 --  CALCIUM 11.0* 11.0* --  MG -- -- 1.6  PHOS -- -- --   CBC:  Lab 04/16/11 0530 04/15/11 0500 04/14/11 0613 04/13/11 0700  WBC 8.8 7.8 -- --  NEUTROABS -- -- 5.8 9.5*  HGB 10.3* 10.6* -- --  HCT 31.6* 32.2* -- --  MCV 94.3 93.9 -- --  PLT 166 158 -- --    Medications: I have reviewed the  patient's current medications. Scheduled Meds:   . bimatoprost  1 drop Both Eyes QHS  . brimonidine  1 drop Both Eyes TID  . ciprofloxacin  500 mg Oral Q12H  . clopidogrel  75 mg Oral Q breakfast  . enoxaparin  40 mg Subcutaneous Daily  . metroNIDAZOLE  500 mg Oral Q8H   Continuous Infusions:   . sodium chloride 50 mL/hr at 04/16/11 0600   PRN Meds:.acetaminophen, hydrALAZINE, morphine, ondansetron (ZOFRAN) IV, zolpidem  Assessment/Plan: The patient is an 78 yo woman with a history of significant vascular disease (CAD, PVD), HTN, and hyperparathyroidism, presenting with abdominal pain likely due to ischemic colitis, which has resolved.  1. Abdominal Pain - patient presents with abdominal pain and blood streaked stool (stable cbc's), with CT evidence of colitis, concerning for ischemic colitis (given history of vascular disease, blood streaked stool, etc.) vs infectious colitis. Now patient is tolerating regular diet without pain, and with downtrending WBC count. Patient appears to be clinically improving from an episode of ischemic colitis. Today is day 7 of cipro/flagyl treatment for infectious colitis, which is significantly less likely, though possible, cause of her symptoms. -greatly appreciate GI consult and surgery consult  -blood cultures - no growth to date  -today is the patient's last  day of cipro/flagyl for coverage of possible infectious colitis  2. ?Urinary tract infection - patient presents with suprapubic pain, with UA showing negative nitrites, trace LE, 7-10 WBC's, negative culture. Given evidence of colitis as an explanation for the patient's pain, patient likely does not have UTI. However, patient has completed 7 days of cipro.   3. Hypertension - well-controlled  -resume home regimen at discharge  4. Coronary Artery Disease - chronic, stable  -plavix   5. Prophy - Lovenox   6. Dispo - Discharge home today.  Patient's PCP's office is closed today, but I will  follow-up tomorrow and ensure that the patient has an upcoming appointment.    LOS: 6 days   Elnora Morrison 04/16/2011, 10:39 AM

## 2011-04-16 NOTE — Progress Notes (Signed)
Reviewed discharge instructions with patient and daughter, they stated their understanding.  Also reviewed colitis information sheet.  IV discontinued.  Cath intact.  Patient discharged via wheelchair to families car.  Sanda Linger

## 2011-04-16 NOTE — Discharge Summary (Signed)
Internal [facility] Discharge Note  Name: [name] MRN: [id number] DOB: February 19, 1926 78 y.o.  Date of Admission: 04/10/2011  2:34 AM Date of Discharge: 04/16/2011 Attending Physician: Burman Freestone  Discharge Diagnosis: 1. Ischemic colitis - p/w bloody BM, abd pain, colitis on CT; improved with conservative measures. 2. ?Urinary tract infection - symptomatic, but negative labs; treated with cipro. 3. Coronary Artery Disease 4. Hypertension 5. Hyperlipidemia 6. Peripheral Vascular Disease 7. Hyperparathyroidism  Discharge Medications: Current Discharge Medication List    CONTINUE these medications which have CHANGED   Details  simvastatin (ZOCOR) 40 MG tablet Take 1 tablet (40 mg total) by mouth at bedtime. Qty: 30 tablet, Refills: 0    traMADol (ULTRAM) 50 MG tablet Take 1-2 tablets (50-100 mg total) by mouth 2 (two) times daily. Take 2 tablets every morning and take 1 tablet every evening Qty: 30 tablet, Refills: 0      CONTINUE these medications which have NOT CHANGED   Details  aspirin 325 MG tablet Take 325 mg by mouth daily.      bimatoprost (LUMIGAN) 0.03 % ophthalmic drops 1 drop at bedtime.      brimonidine (ALPHAGAN) 0.15 % ophthalmic solution 1 drop 3 (three) times daily.      Cholecalciferol (VITAMIN D) 2000 UNITS tablet Take 2,000 Units by mouth daily.      clopidogrel (PLAVIX) 75 MG tablet Take 1 tablet (75 mg total) by mouth daily. Qty: 30 tablet, Refills: 11    Coenzyme Q10 (CO Q-10) 50 MG CAPS Take 1 capsule by mouth daily.      Cyanocobalamin (VITAMIN B-12) 5000 MCG SUBL Place 1 tablet under the tongue daily.      metoprolol (TOPROL-XL) 50 MG 24 hr tablet Take 1 tablet (50 mg total) by mouth daily. Qty: 30 tablet, Refills: 11   Associated Diagnoses: Essential hypertension, benign    Multiple Vitamins-Minerals (HAIR/SKIN/NAILS PO) Take 1 tablet by mouth daily.      NITROSTAT 0.4 MG SL tablet DISSOLVE ONE TABLET UNDER  THE TONGUE EVERY 5 MINUTES AS NEEDED FOR CHEST PAIN.  DO NOT EXCEED A TOTAL OF 3 DOSES IN 15 MINUTES Qty: 25 each, Refills: 6    ramipril (ALTACE) 10 MG capsule TAKE ONE CAPSULE BY MOUTH EVERY DAY Qty: 30 capsule, Refills: 6      STOP taking these medications     Multiple Vitamin (MULTIVITAMIN) capsule         Disposition and follow-up:   [name] was discharged from [facility] in stable and improved condition on 04/16/11, with resolution of abdominal pain and stabilization of hemoglobin, following an episode of ischemic colitis.  The patient will follow-up with her PCP, Dr. Henrene Pastor, in 1-2 weeks (office closed today, unable to make appointment).  At her follow-up appointment, we recommend asking the patient about any further bloody bowel movements, and checking a cbc to ensure stabilization of hemoglobin.  Two other chronic health issues were revealed through chart review which warrant follow-up: 1. The patient has a history of osteoporosis, and has been on a bisphosphonate within the last year, but is not currently taking a bisphosphonate.  She may benefit from resuming this treatment. 2. The patient has a parathyroid adenoma, noted on imaging in 2009, and previously followed with [facility].  It appears the patient has been lost to follow-up, and would benefit from an outpatient endocrine referral for continued monitoring of this adenoma.   Follow-up Appointments: Discharge Orders  Future Orders Please Complete By Expires   Diet - low sodium heart healthy      Increase activity slowly      Discharge instructions      Comments:   You were hospitalized for Ischemic Colitis, a condition in which the blood supply to the colon is reduced, causing tissue damage to your colon.  This has caused you to have some blood in your stool, which may continue for a few more days after you leave the hospital.  It is important to follow-up with your primary care physician,  and seek medical attention if these symptoms recur.   Call MD for:  temperature >100.4      Call MD for:  severe uncontrolled pain      Call MD for:      Comments:   Persistent or large amounts of blood in your stool      Consultations: General Surgery [facility] Surgery, Gastroenterology [facility] GI)  Procedures Performed:  Ct Abdomen Pelvis W Contrast  04/10/2011  *RADIOLOGY REPORT*  Clinical Data: Abdominal pain, nausea, leukocytosis  CT ABDOMEN AND PELVIS WITH CONTRAST  Technique:  Multidetector CT imaging of the abdomen and pelvis was performed following the standard protocol during bolus administration of intravenous contrast.   Sagittal and coronal MPR images reconstructed from axial data set.  Contrast:  95 ml Omnipaque-300 IV; Dilute oral contrast.  Comparison: 08/09/2003 gas  Findings: Dependent atelectasis bilateral lung bases. Atherosclerotic calcifications aorta and coronary arteries. Tiny nonspecific low attenuation foci in liver unchanged, question cyst. Cyst inferior pole left kidney, 2.5 x 2.4 cm image 31. Mild enlargement versus nodularity of left adrenal gland unchanged. Liver, spleen, pancreas, kidneys, and adrenal glands otherwise normal appearance. Marked thickening of transverse colon compatible with colitis. Stomach, small bowel loops and remaining colon unremarkable.  Appendix not definitely visualized. Bladder, ureters, uterus and adnexae unremarkable. No mass, adenopathy, free fluid or hernia. Surgical clips at right inguinal region from prior vascular surgery. Short segment of a femoral graft is identified, without internal contrast enhancement, question occluded. Severe multilevel degenerative disc and facet disease changes of the thoracolumbar spine.  IMPRESSION: Colitis involving the transverse colon. Differential diagnosis includes infection, inflammatory bowel disease, and ischemia. No other significant intra-abdominal or intrapelvic abnormalities. Prior vascular  graft at left inguinal region, question occluded, recommend clinical correlation.  Original Report Authenticated By: Burnetta Sabin, M.D.    Admission HPI:  [name] is an 78 year old woman with past medical history significant for coronary artery disease status post stenting, hyperlipidemia, hypertension, peripheral vascular disease, arthritis and hyperparathyroidism who presents to the emergency department with her daughter and granddaughter for complaint of abdominal pain. Patient states that the abdominal pain first began yesterday evening and progressively got worse. At 1 AM patient's daughter had called EMS. Patient was profusely sweating and complained of severe abdominal pain located in her mid abdomen. The pain does not radiate her travel anywhere. The pain is described as a sharp pain.The pain was associated with nausea and patient felt like she wanted to throw up however was not able to. Patient had one loose bowel movement yesterday and 3 loose bowel movements upon arriving to the emergency department. No obvious blood was noted however on fecal occult blood test there was trace amounts of blood noted in stool. Patient has not recently been on any antibiotics. Patient denies cough, shortness of breath, and chest pain. She does complain of chills and may have had a fever however it it is  not documented. Patient denies any urinary symptoms such as burning upon urination, urgency and frequency. However patient does point to suprapubic region for where her abdominal pain is located. Patient states that she has not had this pain in the past. Patient received pain medication and thus it is low in intensity right now.  Admission Physical Exam Blood pressure 144/81, pulse 87, temperature 99.3 F (37.4 C), temperature source Oral, resp. rate 18, SpO2 96.00%.  Constitutional: She is oriented to person, place, and time. She appears well-developed.  HENT:  Head: Normocephalic.  Neck: Normal range of  motion. Neck supple.  Cardiovascular: Normal rate, regular rhythm and normal heart sounds.  Respiratory: Effort normal and breath sounds normal.  GI: Soft. She exhibits no mass. There is tenderness. There is no rebound and no guarding. Hypoactive to diminished bowel sounds diffusely. Suprapubic tenderness. Musculoskeletal: She exhibits no edema. Left foot cooler than right secondary to PVD but at baseline per patient, pulses are faint but palpable. Neurological: She is alert and oriented to person, place, and time.  Skin: Skin is warm and dry.  Psychiatric: She has a normal mood and affect.   Admission Labs: WBC: 13.0;  Hb: 13.1;  HCT: 40.8;  Plt: 130 Na: 146;  K: 4.0;  Cl: 113;  CO2: 25;  BUN: 26;  Cr: 1.0;  Glucose: 139 Lactic acid: 2.4 UA: negative for nitrites, trace LE, 7-10 [facility]  Hospital Course by problem list: 1. Ischemic colitis - The patient presented with abdominal pain and blood in her bowel movements.  CT scan showed transverse colitis, initially concerning for ischemic colitis (given blood in BM, history of significant vascular disease, though lactic acid levels were wnl) versus infectious colitis (given high and initially uptrending WBC's).  The patient was made NPO, and managed conservatively with IV fluids, pain control, and monitoring of the patient's hemoglobin, which remained stable during hospitalization.  Surgery was consulted and followed this patient, but did not believe the patient required acute surgical intervention.  GI was also consulted, and agreed with our suspicion of ischemic colitis, rather than infectious colitis.  The patient's diet was slowly advanced over the course of hospitalization, and by discharge the patient was tolerating a regular diet with no abdominal pain.  The patient was initially treated with cipro and flagyl for infectious colitis, and these medications were continued to discharge due to the initial uncertainty regarding the etiology of the  patient's colitis, completing a 7-day course.  2. ?Urinary tract infection - The patient presented with suprapubic pain, with UA showing trace LE's and 7-10 WBC's.  There was initial concern for UTI, but given the relatively negative labs and an alternate reason for her suprapubic pain, the patient likely did not have a urinary tract infection.  However, the patient was treated with cipro for possible infectious colitis, which would have covered any possible UTI.  3. Hypertension - the patient has a history of hypertension.  The patient's anti-hypertensives were held during this hospitalization for low-normal BP's during hospitalization.  The patient was discharged on her home BP medication  4. Coronary Artery Disease - the patient has a history of coronary artery disease, with a history of intermittant non-compliance with plavix.  The patient was continued on plavix during this hospitalization.  5.  Hyperparathyroidism - the patient has a history of a right parathyroid adenoma, seen on imaging in 2009.  She notes that she previously followed with [facility], but she appears to have been lost to follow-up.  The patient's PCP was notified, and the patient will likely need either PCP or outpatient Endocrinology follow-up of this adenoma.  The patient was asymptomatic during this hospitalization.   Discharge Vitals:  BP 118/74  Pulse 77  Temp(Src) 98.6 F (37 C) (Oral)  Resp 18  Ht 5\' 1"  (1.549 m)  Wt 115 lb 9.6 oz (52.436 kg)  BMI 21.84 kg/m2  SpO2 98%  Discharge Labs:  Results for orders placed during the hospital encounter of 04/10/11 (from the past 24 hour(s))  BASIC METABOLIC PANEL     Status: Abnormal   Collection Time   04/16/11  5:30 AM      Component Value Range   Sodium 142  135 - 145 (mEq/L)   Potassium 3.8  3.5 - 5.1 (mEq/L)   Chloride 112  96 - 112 (mEq/L)   CO2 23  19 - 32 (mEq/L)   Glucose, Bld 93  70 - 99 (mg/dL)   BUN 15  6 - 23 (mg/dL)   Creatinine, Ser 0.87   0.50 - 1.10 (mg/dL)   Calcium 11.0 (*) 8.4 - 10.5 (mg/dL)   GFR calc non Af Amer 60 (*) >90 (mL/min)   GFR calc Af Amer 69 (*) >90 (mL/min)  CBC     Status: Abnormal   Collection Time   04/16/11  5:30 AM      Component Value Range   WBC 8.8  4.0 - 10.5 (K/uL)   RBC 3.35 (*) 3.87 - 5.11 (MIL/uL)   Hemoglobin 10.3 (*) 12.0 - 15.0 (g/dL)   HCT 31.6 (*) 36.0 - 46.0 (%)   MCV 94.3  78.0 - 100.0 (fL)   MCH 30.7  26.0 - 34.0 (pg)   MCHC 32.6  30.0 - 36.0 (g/dL)   RDW 12.9  11.5 - 15.5 (%)   Platelets 166  150 - 400 (K/uL)    Signed: Elnora Morrison 04/16/2011, 10:45 AM

## 2011-04-17 LAB — PARATHYROID HORMONE, INTACT (NO CA): PTH: 135.9 pg/mL — ABNORMAL HIGH (ref 14.0–72.0)

## 2011-04-18 LAB — CULTURE, BLOOD (ROUTINE X 2)
Culture  Setup Time: 201212281726
Culture: NO GROWTH

## 2011-05-07 DIAGNOSIS — Z79899 Other long term (current) drug therapy: Secondary | ICD-10-CM | POA: Diagnosis not present

## 2011-05-07 DIAGNOSIS — M949 Disorder of cartilage, unspecified: Secondary | ICD-10-CM | POA: Diagnosis not present

## 2011-05-07 DIAGNOSIS — M129 Arthropathy, unspecified: Secondary | ICD-10-CM | POA: Diagnosis not present

## 2011-05-07 DIAGNOSIS — M899 Disorder of bone, unspecified: Secondary | ICD-10-CM | POA: Diagnosis not present

## 2011-05-07 DIAGNOSIS — E782 Mixed hyperlipidemia: Secondary | ICD-10-CM | POA: Diagnosis not present

## 2011-05-07 DIAGNOSIS — I739 Peripheral vascular disease, unspecified: Secondary | ICD-10-CM | POA: Diagnosis not present

## 2011-05-07 DIAGNOSIS — I10 Essential (primary) hypertension: Secondary | ICD-10-CM | POA: Diagnosis not present

## 2011-05-13 ENCOUNTER — Other Ambulatory Visit: Payer: Self-pay | Admitting: Cardiology

## 2011-05-17 DIAGNOSIS — M899 Disorder of bone, unspecified: Secondary | ICD-10-CM | POA: Diagnosis not present

## 2011-05-17 DIAGNOSIS — M949 Disorder of cartilage, unspecified: Secondary | ICD-10-CM | POA: Diagnosis not present

## 2011-05-24 DIAGNOSIS — H524 Presbyopia: Secondary | ICD-10-CM | POA: Diagnosis not present

## 2011-05-24 DIAGNOSIS — H4011X0 Primary open-angle glaucoma, stage unspecified: Secondary | ICD-10-CM | POA: Diagnosis not present

## 2011-05-25 ENCOUNTER — Encounter: Payer: Self-pay | Admitting: Internal Medicine

## 2011-06-18 ENCOUNTER — Ambulatory Visit: Payer: Medicare Other | Admitting: Cardiology

## 2011-06-20 ENCOUNTER — Ambulatory Visit (INDEPENDENT_AMBULATORY_CARE_PROVIDER_SITE_OTHER): Payer: Medicare Other | Admitting: Cardiology

## 2011-06-20 ENCOUNTER — Encounter: Payer: Self-pay | Admitting: Cardiology

## 2011-06-20 DIAGNOSIS — I739 Peripheral vascular disease, unspecified: Secondary | ICD-10-CM | POA: Diagnosis not present

## 2011-06-20 DIAGNOSIS — I2510 Atherosclerotic heart disease of native coronary artery without angina pectoris: Secondary | ICD-10-CM

## 2011-06-20 DIAGNOSIS — I10 Essential (primary) hypertension: Secondary | ICD-10-CM

## 2011-06-20 DIAGNOSIS — E782 Mixed hyperlipidemia: Secondary | ICD-10-CM | POA: Diagnosis not present

## 2011-06-20 NOTE — Assessment & Plan Note (Signed)
The patient has no new sypmtoms.  No further cardiovascular testing is indicated.  We will continue with aggressive risk reduction and meds as listed.  

## 2011-06-20 NOTE — Assessment & Plan Note (Signed)
This is followed by her primary provider. No change in therapy is indicated.

## 2011-06-20 NOTE — Patient Instructions (Signed)
The current medical regimen is effective;  continue present plan and medications.  Follow up in 1 year with Dr Hochrein.  You will receive a letter in the mail 2 months before you are due.  Please call us when you receive this letter to schedule your follow up appointment.  

## 2011-06-20 NOTE — Assessment & Plan Note (Signed)
The blood pressure is at target. No change in medications is indicated. We will continue with therapeutic lifestyle changes (TLC).  

## 2011-06-20 NOTE — Progress Notes (Signed)
HPI The patient presents for followup of known coronary disease. Since I last saw her she has had no new cardiovascular complaints. Since I last saw her she has had no new cardiovascular complaints. She continues to get some chronic leg pain which is unchanged over the last year.  However, she does not get chest pressure, neck or arm discomfort. She does not get shortness of breath, PND or orthopnea. She does not have palpitations, presyncope or syncope. She is limited by back and joint pains. She does get muscle aches when she takes her pravastatin. She tries to take it and is taking it about every other day.  Allergies  Allergen Reactions  . Diphenhydramine Hcl Other (See Comments)    unknown  . Penicillins Other (See Comments)    unknown    Current Outpatient Prescriptions  Medication Sig Dispense Refill  . aspirin 325 MG tablet Take 325 mg by mouth daily.        . bimatoprost (LUMIGAN) 0.03 % ophthalmic drops 1 drop at bedtime.        . brimonidine (ALPHAGAN) 0.15 % ophthalmic solution Place 1 drop into the right eye 2 (two) times daily.  5 mL  0  . clopidogrel (PLAVIX) 75 MG tablet Take 1 tablet (75 mg total) by mouth daily.  30 tablet  11  . Coenzyme Q10 (CO Q-10) 50 MG CAPS Take 1 capsule by mouth daily.        . Cyanocobalamin (VITAMIN B-12) 5000 MCG SUBL Place 1 tablet under the tongue daily.        . metoprolol (TOPROL-XL) 50 MG 24 hr tablet Take 1 tablet (50 mg total) by mouth daily.  30 tablet  11  . Multiple Vitamins-Minerals (HAIR/SKIN/NAILS PO) Take 1 tablet by mouth daily.        Marland Kitchen NITROSTAT 0.4 MG SL tablet DISSOLVE ONE TABLET UNDER THE TONGUE EVERY 5 MINUTES AS NEEDED FOR CHEST PAIN.  DO NOT EXCEED A TOTAL OF 3 DOSES IN 15 MINUTES  25 each  6  . pravastatin (PRAVACHOL) 40 MG tablet Take 40 mg by mouth daily.      . ramipril (ALTACE) 10 MG capsule TAKE ONE CAPSULE BY MOUTH EVERY DAY  30 capsule  6  . traMADol (ULTRAM) 50 MG tablet Take 1-2 tablets (50-100 mg total) by  mouth 2 (two) times daily. Take 2 tablets every morning and take 1 tablet every evening  30 tablet  0    Past Medical History  Diagnosis Date  . CAD (coronary artery disease) 2003, 2010    s/p stenting of RCA x2, Last cardiac cath in 2011 with 90% occlusion in obtuse marginal but too unstable to stent, with no stenting, last Echo 2010 showing normal EF 60-65% with grade 2 diastolic dysfunction   . Hyperlipidemia   . HTN (hypertension)   . PVD (peripheral vascular disease)     s/p left fem-pop bypass graft  . Hyperparathyroidism 2003    parathyroid adenoma localized to the right inferior thyroid lobe region  . Renal artery stenosis     left, 40%  . Osteoporosis 2009    diagnosed by bone density scan in 2009, on Vit D only, no bisphosphonate therapy  . Bacteremia 2003    hx of staph bacteremia  . Degenerative disc disease   . Lumbar stenosis with neurogenic claudication 2006    s/p decompression L2-L5  . Abscess 2003    I&D SCM abscess  . Arthritis   . History  of medication noncompliance     concerning Plavix  . Depression   . Phlebitis     LLE  . Angina   . Myocardial infarction 2010; 2011  . Shortness of breath on exertion   . Hypothyroidism   . Acute ischemic colitis 04/10/11    acute episode of ischemic colitis, with abd pain, CT evidence of transverse colitis, and blood streaked BM.  Resolved with conservative treatment.    Past Surgical History  Procedure Date  . Appendectomy   . Femoral bypass before 2003    LLE  . Incision and drainage anterior neck 10/2001    absces sternoclavicular joint  . Coronary angioplasty with stent placement 09/2008  . Coronary angioplasty with stent placement 04/2001  . Back surgery 06/2004    L3, L4 lami; L2, L5 hemilami; decompresion L2-3, 3-4, 4-5    ROS:  As stated in the HPI and negative for all other systems, except for back pain  PHYSICAL EXAM There were no vitals taken for this visit. GENERAL:  Well appearing HEENT:  Pupils  equal round and reactive, fundi not visualized, oral mucosa unremarkable, denutres NECK:  No jugular venous distention, waveform within normal limits, carotid upstroke brisk and symmetric, no bruits, no thyromegaly LYMPHATICS:  No cervical, inguinal adenopathy LUNGS:  Clear to auscultation bilaterally BACK:  No CVA tenderness CHEST:  Unremarkable HEART:  PMI not displaced or sustained,S1 and S2 within normal limits, no S3, no S4, no clicks, no rubs, no murmurs ABD:  Flat, positive bowel sounds normal in frequency in pitch, no bruits, no rebound, no guarding, no midline pulsatile mass, no hepatomegaly, no splenomegaly EXT:  2 plus pulses upper, decreased PT/DP bilateral, no edema, no cyanosis no clubbing SKIN:  No rashes no nodules NEURO:  Cranial nerves II through XII grossly intact, motor grossly intact throughout PSYCH:  Cognitively intact, oriented to person place and time   ASSESSMENT AND PLAN

## 2011-06-20 NOTE — Assessment & Plan Note (Signed)
>>  ASSESSMENT AND PLAN FOR HYPERTENSION WRITTEN ON 06/20/2011 11:13 AM BY Minus Breeding, MD  The blood pressure is at target. No change in medications is indicated. We will continue with therapeutic lifestyle changes (TLC).

## 2011-06-20 NOTE — Assessment & Plan Note (Signed)
Her symptoms have not changed. She had ABIs in 2011. We discussed good foot care. She will otherwise continue with risk reduction.

## 2011-07-14 ENCOUNTER — Other Ambulatory Visit: Payer: Self-pay | Admitting: Cardiology

## 2011-07-16 ENCOUNTER — Other Ambulatory Visit: Payer: Self-pay | Admitting: *Deleted

## 2011-08-06 DIAGNOSIS — I739 Peripheral vascular disease, unspecified: Secondary | ICD-10-CM | POA: Diagnosis not present

## 2011-08-06 DIAGNOSIS — E782 Mixed hyperlipidemia: Secondary | ICD-10-CM | POA: Diagnosis not present

## 2011-08-06 DIAGNOSIS — I10 Essential (primary) hypertension: Secondary | ICD-10-CM | POA: Diagnosis not present

## 2011-08-25 ENCOUNTER — Inpatient Hospital Stay (HOSPITAL_COMMUNITY)
Admission: EM | Admit: 2011-08-25 | Discharge: 2011-08-29 | DRG: 069 | Disposition: A | Discharge status: Home / Self Care | Payer: Medicare Other | Attending: Internal Medicine | Admitting: Internal Medicine

## 2011-08-25 ENCOUNTER — Encounter (HOSPITAL_COMMUNITY): Payer: Self-pay | Admitting: Emergency Medicine

## 2011-08-25 ENCOUNTER — Emergency Department (HOSPITAL_COMMUNITY): Payer: Medicare Other

## 2011-08-25 DIAGNOSIS — Z823 Family history of stroke: Secondary | ICD-10-CM

## 2011-08-25 DIAGNOSIS — I110 Hypertensive heart disease with heart failure: Secondary | ICD-10-CM | POA: Insufficient documentation

## 2011-08-25 DIAGNOSIS — G839 Paralytic syndrome, unspecified: Secondary | ICD-10-CM | POA: Diagnosis not present

## 2011-08-25 DIAGNOSIS — R51 Headache: Secondary | ICD-10-CM | POA: Diagnosis not present

## 2011-08-25 DIAGNOSIS — I739 Peripheral vascular disease, unspecified: Secondary | ICD-10-CM | POA: Diagnosis present

## 2011-08-25 DIAGNOSIS — S8000XA Contusion of unspecified knee, initial encounter: Secondary | ICD-10-CM | POA: Diagnosis present

## 2011-08-25 DIAGNOSIS — R5381 Other malaise: Secondary | ICD-10-CM | POA: Diagnosis present

## 2011-08-25 DIAGNOSIS — E213 Hyperparathyroidism, unspecified: Secondary | ICD-10-CM | POA: Diagnosis present

## 2011-08-25 DIAGNOSIS — I10 Essential (primary) hypertension: Secondary | ICD-10-CM | POA: Diagnosis present

## 2011-08-25 DIAGNOSIS — M542 Cervicalgia: Secondary | ICD-10-CM | POA: Diagnosis not present

## 2011-08-25 DIAGNOSIS — M47812 Spondylosis without myelopathy or radiculopathy, cervical region: Secondary | ICD-10-CM | POA: Diagnosis present

## 2011-08-25 DIAGNOSIS — I2510 Atherosclerotic heart disease of native coronary artery without angina pectoris: Secondary | ICD-10-CM | POA: Diagnosis present

## 2011-08-25 DIAGNOSIS — K559 Vascular disorder of intestine, unspecified: Secondary | ICD-10-CM

## 2011-08-25 DIAGNOSIS — Y998 Other external cause status: Secondary | ICD-10-CM

## 2011-08-25 DIAGNOSIS — E785 Hyperlipidemia, unspecified: Secondary | ICD-10-CM | POA: Diagnosis present

## 2011-08-25 DIAGNOSIS — R5383 Other fatigue: Secondary | ICD-10-CM | POA: Diagnosis present

## 2011-08-25 DIAGNOSIS — R296 Repeated falls: Secondary | ICD-10-CM | POA: Diagnosis present

## 2011-08-25 DIAGNOSIS — M810 Age-related osteoporosis without current pathological fracture: Secondary | ICD-10-CM | POA: Diagnosis present

## 2011-08-25 DIAGNOSIS — I6789 Other cerebrovascular disease: Secondary | ICD-10-CM | POA: Diagnosis not present

## 2011-08-25 DIAGNOSIS — M79609 Pain in unspecified limb: Secondary | ICD-10-CM | POA: Diagnosis present

## 2011-08-25 DIAGNOSIS — F329 Major depressive disorder, single episode, unspecified: Secondary | ICD-10-CM

## 2011-08-25 DIAGNOSIS — S0083XA Contusion of other part of head, initial encounter: Secondary | ICD-10-CM | POA: Diagnosis present

## 2011-08-25 DIAGNOSIS — N390 Urinary tract infection, site not specified: Secondary | ICD-10-CM

## 2011-08-25 DIAGNOSIS — E782 Mixed hyperlipidemia: Secondary | ICD-10-CM

## 2011-08-25 DIAGNOSIS — Z8673 Personal history of transient ischemic attack (TIA), and cerebral infarction without residual deficits: Secondary | ICD-10-CM

## 2011-08-25 DIAGNOSIS — W19XXXA Unspecified fall, initial encounter: Secondary | ICD-10-CM

## 2011-08-25 DIAGNOSIS — G8190 Hemiplegia, unspecified affecting unspecified side: Secondary | ICD-10-CM

## 2011-08-25 DIAGNOSIS — G459 Transient cerebral ischemic attack, unspecified: Principal | ICD-10-CM | POA: Diagnosis present

## 2011-08-25 DIAGNOSIS — I252 Old myocardial infarction: Secondary | ICD-10-CM

## 2011-08-25 DIAGNOSIS — S0003XA Contusion of scalp, initial encounter: Secondary | ICD-10-CM | POA: Diagnosis not present

## 2011-08-25 DIAGNOSIS — E039 Hypothyroidism, unspecified: Secondary | ICD-10-CM | POA: Diagnosis present

## 2011-08-25 LAB — COMPREHENSIVE METABOLIC PANEL
AST: 27 U/L (ref 0–37)
Albumin: 3.7 g/dL (ref 3.5–5.2)
Alkaline Phosphatase: 94 U/L (ref 39–117)
BUN: 17 mg/dL (ref 6–23)
Chloride: 109 mEq/L (ref 96–112)
Potassium: 4.2 mEq/L (ref 3.5–5.1)
Total Protein: 7 g/dL (ref 6.0–8.3)

## 2011-08-25 LAB — DIFFERENTIAL
Basophils Absolute: 0 10*3/uL (ref 0.0–0.1)
Basophils Relative: 1 % (ref 0–1)
Eosinophils Absolute: 0.2 10*3/uL (ref 0.0–0.7)
Monocytes Relative: 11 % (ref 3–12)
Neutro Abs: 2.4 10*3/uL (ref 1.7–7.7)
Neutrophils Relative %: 40 % — ABNORMAL LOW (ref 43–77)

## 2011-08-25 LAB — CBC
Hemoglobin: 11.9 g/dL — ABNORMAL LOW (ref 12.0–15.0)
MCH: 31.3 pg (ref 26.0–34.0)
MCHC: 32.6 g/dL (ref 30.0–36.0)
Platelets: 143 10*3/uL — ABNORMAL LOW (ref 150–400)
RDW: 12.7 % (ref 11.5–15.5)

## 2011-08-25 LAB — PROTIME-INR
INR: 0.96 (ref 0.00–1.49)
Prothrombin Time: 13 seconds (ref 11.6–15.2)

## 2011-08-25 LAB — CK TOTAL AND CKMB (NOT AT ARMC)
CK, MB: 8.2 ng/mL (ref 0.3–4.0)
Total CK: 293 U/L — ABNORMAL HIGH (ref 7–177)

## 2011-08-25 LAB — POCT I-STAT, CHEM 8
Calcium, Ion: 1.49 mmol/L — ABNORMAL HIGH (ref 1.12–1.32)
Chloride: 110 mEq/L (ref 96–112)
Glucose, Bld: 114 mg/dL — ABNORMAL HIGH (ref 70–99)
HCT: 37 % (ref 36.0–46.0)

## 2011-08-25 LAB — APTT: aPTT: 27 seconds (ref 24–37)

## 2011-08-25 NOTE — ED Notes (Signed)
Per EMS: Pt was at the grocery store with grandaughter when pt moaned and fell.  Denies LOC but c/o left arm pain, left sided weakness, left arm numbness.  Pt is alert and oriented.

## 2011-08-25 NOTE — ED Provider Notes (Addendum)
78 year old female was last seen normal at 9 PM. She apparently fell although it is unclear whether the fall was witnessed. She is complaining of pain in her left arm but has weakness in the left side down. She was brought in by ambulance as a code stroke. Because of the fall, she was immobilized on a spine board. On exam, she has some minor facial abrasions. There is no facial droop. Lungs are clear and heart has regular rate and rhythm. She has moderate weakness of the left arm and left leg with strength approximately 3/5. There is full range of motion of the left arm without obvious pain. She appears to have ACE stroke with left hemiparesthesias. Absence of face involvement suggests possibility of an internal capsule lesion. The neurologist is currently here evaluating the patient as well.  ECG shows normal sinus rhythm with a rate of 72, no ectopy. Normal axis. Normal P wave. Low voltage QRS. Normal intervals. Normal ST and T waves. Impression: Low voltage QRS, otherwise normal ECG. When compared with ECG of 04/10/2011, no significant changes are seen.  CT of the head and cervical spine were unremarkable except for evidence of sinusitis. I have reviewed the images and discussed them with the radiologist. After return from CT scan, her neurologic function returned to normal and she currently has an NIH stroke scale of 0. Dr. Doy Mince of neurology suggested she should be admitted to the medicine service with diagnosis of transient ischemic attack.   Delora Fuel, MD [phone] [id number]  CRITICAL CARE Performed by: [id number]   Total critical care time: 35 minutes  Critical care time was exclusive of separately billable procedures and treating other patients.  Critical care was necessary to treat or prevent imminent or life-threatening deterioration.  Critical care was time spent personally by me on the following activities: development of treatment plan with patient and/or surrogate as well as  nursing, discussions with consultants, evaluation of patient's response to treatment, examination of patient, obtaining history from patient or surrogate, ordering and performing treatments and interventions, ordering and review of laboratory studies, ordering and review of radiographic studies, pulse oximetry and re-evaluation of patient's condition.   Delora Fuel, MD [id number]

## 2011-08-25 NOTE — Consult Note (Signed)
Referring Physician: Roxanne Mins     Chief Complaint: Left sided weakness  HPI: [name] is an 78 y.o. female who was walking behind her granddaughter.  Without warning fell.  No dizziness or lightheadedness.   After falling was noted to have left upper and lower extremity weakness.  Left upper extremity was numb as well.  No facial complaints.  Patient was brought in by EMS as a code stroke.    LSN: [id number] tPA Given: No: Rapid improvement in symptoms  Past Medical History  Diagnosis Date  . CAD (coronary artery disease) 2003, 2010    s/p stenting of RCA x2, Last cardiac cath in 2011 with 90% occlusion in obtuse marginal but too unstable to stent, with no stenting, last Echo 2010 showing normal EF 60-65% with grade 2 diastolic dysfunction   . Hyperlipidemia   . HTN (hypertension)   . PVD (peripheral vascular disease)     s/p left fem-pop bypass graft  . Hyperparathyroidism 2003    parathyroid adenoma localized to the right inferior thyroid lobe region  . Renal artery stenosis     left, 40%  . Osteoporosis 2009    diagnosed by bone density scan in 2009, on Vit D only, no bisphosphonate therapy  . Bacteremia 2003    hx of staph bacteremia  . Degenerative disc disease   . Lumbar stenosis with neurogenic claudication 2006    s/p decompression L2-L5  . Abscess 2003    I&D SCM abscess  . Arthritis   . History of medication noncompliance     concerning Plavix  . Depression   . Phlebitis     LLE  . Angina   . Myocardial infarction 2010; 2011  . Shortness of breath on exertion   . Hypothyroidism   . Acute ischemic colitis 04/10/11    acute episode of ischemic colitis, with abd pain, CT evidence of transverse colitis, and blood streaked BM.  Resolved with conservative treatment.    Past Surgical History  Procedure Date  . Appendectomy   . Femoral bypass before 2003    LLE  . Incision and drainage anterior neck 10/2001    absces sternoclavicular joint  . Coronary angioplasty with  stent placement 09/2008  . Coronary angioplasty with stent placement 04/2001  . Back surgery 06/2004    L3, L4 lami; L2, L5 hemilami; decompresion L2-3, 3-4, 4-5    Family History  Problem Relation Age of Onset  . Stroke Mother   . Stroke Father    Social History:  reports that she has never smoked. She has never used smokeless tobacco. She reports that she does not drink alcohol or use illicit drugs.  Allergies:  Allergies  Allergen Reactions  . Diphenhydramine Hcl Other (See Comments)    unknown  . Penicillins Other (See Comments)    unknown    Medications: I have reviewed the patient's current medications. Prior to Admission:  ASA, Plavix, Lumigan, CoenzymeQ10, Vitamin B12, MVI, Toprol, Nitrostat, Pravachol, Altace, Ultram  ROS: History obtained from the patient  General ROS: negative for - chills, fatigue, fever, night sweats, weight gain or weight loss Psychological ROS: negative for - behavioral disorder, hallucinations, memory difficulties, mood swings or suicidal ideation Ophthalmic ROS: negative for - blurry vision, double vision, eye pain or loss of vision ENT ROS: negative for - epistaxis, nasal discharge, oral lesions, sore throat, tinnitus or vertigo Allergy and Immunology ROS: negative for - hives or itchy/watery eyes Hematological and Lymphatic ROS: negative for - bleeding  problems, bruising or swollen lymph nodes Endocrine ROS: negative for - galactorrhea, hair pattern changes, polydipsia/polyuria or temperature intolerance Respiratory ROS: negative for - cough, hemoptysis, shortness of breath or wheezing Cardiovascular ROS: negative for - chest pain, dyspnea on exertion, edema or irregular heartbeat Gastrointestinal ROS: negative for - abdominal pain, diarrhea, hematemesis, nausea/vomiting or stool incontinence Genito-Urinary ROS: negative for - dysuria, hematuria, incontinence or urinary frequency/urgency Musculoskeletal ROS: left arm pain, chronic leg  pain Neurological ROS: as noted in HPI Dermatological ROS: negative for rash and skin lesion changes  Physical Examination: BP-173/76; HR-72; RR-16; SpO2-100%  Neurologic Examination: Mental Status: Alert, oriented, thought content appropriate.  Speech fluent without evidence of aphasia.  Able to follow 3 step commands without difficulty. Cranial Nerves: II: visual fields grossly normal, pupils equal, round, reactive to light and accommodation III,IV, VI: ptosis not present, extra-ocular motions intact bilaterally V,VII: smile symmetric, facial light touch sensation normal bilaterally VIII: hearing normal bilaterally IX,X: gag reflex present XI: trapezius strength/neck flexion strength normal bilaterally XII: tongue strength normal  Motor: Right : Upper extremity   5/5    Left:     Upper extremity   5/5  Lower extremity   5/5     Lower extremity   5/5 excluding the hip flexors bilaterally that are 5-/5 Tone and bulk:normal tone throughout; no atrophy noted Sensory: Pinprick and light touch intact throughout, bilaterally Deep Tendon Reflexes: 2+ in the upper extremities and at the right knee.  Absent otherwise in the bilateral lower extremities. Plantars: Right: mute   Left: mute Cerebellar: normal finger-to-nose and normal heel-to-shin test   Results for orders placed during the hospital encounter of 08/25/11 (from the past 48 hour(s))  CBC     Status: Abnormal   Collection Time   08/25/11  9:52 PM      Component Value Range Comment   WBC 6.0  4.0 - 10.5 (K/uL)    RBC 3.80 (*) 3.87 - 5.11 (MIL/uL)    Hemoglobin 11.9 (*) 12.0 - 15.0 (g/dL)    HCT 36.5  36.0 - 46.0 (%)    MCV 96.1  78.0 - 100.0 (fL)    MCH 31.3  26.0 - 34.0 (pg)    MCHC 32.6  30.0 - 36.0 (g/dL)    RDW 12.7  11.5 - 15.5 (%)    Platelets 143 (*) 150 - 400 (K/uL)   DIFFERENTIAL     Status: Abnormal   Collection Time   08/25/11  9:52 PM      Component Value Range Comment   Neutrophils Relative 40 (*) 43 - 77  (%)    Neutro Abs 2.4  1.7 - 7.7 (K/uL)    Lymphocytes Relative 45  12 - 46 (%)    Lymphs Abs 2.7  0.7 - 4.0 (K/uL)    Monocytes Relative 11  3 - 12 (%)    Monocytes Absolute 0.7  0.1 - 1.0 (K/uL)    Eosinophils Relative 3  0 - 5 (%)    Eosinophils Absolute 0.2  0.0 - 0.7 (K/uL)    Basophils Relative 1  0 - 1 (%)    Basophils Absolute 0.0  0.0 - 0.1 (K/uL)    No results found.  Assessment: 78 y.o. female presenting after a fall with left sided numbness and weakness.  Symptoms resolved.  Current NIHSS of 0.  Head CT shows no evidence of an acute event.  Likely a right brain TIA.  Patient on Plavix and Aspirin at home.  Further  work up indicated.    Stroke Risk Factors - hyperlipidemia, hypertension and CAD  Plan: 1. HgbA1c, fasting lipid panel 2. MRI, MRA  of the brain without contrast 3. PT consult 4. Echocardiogram 5. Carotid dopplers 6. Prophylactic therapy-continue ASA and Plavix at current doses 7. Risk factor modification 8. Telemetry monitoring 9. Frequent neuro checks   Alexis Goodell, MD [facility] [phone] 08/25/2011, 10:18 PM

## 2011-08-25 NOTE — ED Provider Notes (Signed)
History     CSN: [id number]  Arrival date & time 08/25/11  2150   First MD Initiated Contact with Patient 08/25/11 2158      No chief complaint on file.   (Consider location/radiation/quality/duration/timing/severity/associated sxs/prior treatment) Patient is a 78 y.o. female presenting with fall. The history is provided by the patient and a relative.  Fall The accident occurred less than 1 hour ago. The fall occurred while walking. Distance fallen: from standing. She landed on concrete. There was no blood loss. The point of impact was the head (right side). Pain location: right arm. The pain is mild. She was not ambulatory at the scene. There was no entrapment after the fall. There was no drug use involved in the accident. There was no alcohol use involved in the accident. Pertinent negatives include no fever, no abdominal pain, no nausea, no vomiting, no hematuria and no headaches. Exacerbated by: nothing. Treatment on scene includes a c-collar and a backboard. She has tried nothing for the symptoms. The treatment provided no relief.    Past Medical History  Diagnosis Date  . CAD (coronary artery disease) 2003, 2010    s/p stenting of RCA x2, Last cardiac cath in 2011 with 90% occlusion in obtuse marginal but too unstable to stent, with no stenting, last Echo 2010 showing normal EF 60-65% with grade 2 diastolic dysfunction   . Hyperlipidemia   . HTN (hypertension)   . PVD (peripheral vascular disease)     s/p left fem-pop bypass graft  . Hyperparathyroidism 2003    parathyroid adenoma localized to the right inferior thyroid lobe region  . Renal artery stenosis     left, 40%  . Osteoporosis 2009    diagnosed by bone density scan in 2009, on Vit D only, no bisphosphonate therapy  . Bacteremia 2003    hx of staph bacteremia  . Degenerative disc disease   . Lumbar stenosis with neurogenic claudication 2006    s/p decompression L2-L5  . Abscess 2003    I&D SCM abscess  . Arthritis    . History of medication noncompliance     concerning Plavix  . Depression   . Phlebitis     LLE  . Angina   . Myocardial infarction 2010; 2011  . Shortness of breath on exertion   . Hypothyroidism   . Acute ischemic colitis 04/10/11    acute episode of ischemic colitis, with abd pain, CT evidence of transverse colitis, and blood streaked BM.  Resolved with conservative treatment.    Past Surgical History  Procedure Date  . Appendectomy   . Femoral bypass before 2003    LLE  . Incision and drainage anterior neck 10/2001    absces sternoclavicular joint  . Coronary angioplasty with stent placement 09/2008  . Coronary angioplasty with stent placement 04/2001  . Back surgery 06/2004    L3, L4 lami; L2, L5 hemilami; decompresion L2-3, 3-4, 4-5    Family History  Problem Relation Age of Onset  . Stroke Mother   . Stroke Father     History  Substance Use Topics  . Smoking status: Never Smoker   . Smokeless tobacco: Never Used  . Alcohol Use: No    OB History    Grav Para Term Preterm Abortions TAB SAB Ect Mult Living                  Review of Systems  Constitutional: Negative for fever and fatigue.  HENT: Negative for congestion, drooling  and neck pain.   Eyes: Negative for pain.  Respiratory: Negative for cough and shortness of breath.   Cardiovascular: Negative for chest pain.  Gastrointestinal: Negative for nausea, vomiting, abdominal pain and diarrhea.  Genitourinary: Negative for dysuria and hematuria.  Musculoskeletal: Negative for back pain and gait problem.  Skin: Negative for color change.  Neurological: Negative for dizziness and headaches.  Hematological: Negative for adenopathy.  Psychiatric/Behavioral: Negative for behavioral problems.  All other systems reviewed and are negative.    Allergies  Diphenhydramine hcl and Penicillins  Home Medications   Current Outpatient Rx  Name Route Sig Dispense Refill  . ASPIRIN 325 MG PO TABS Oral Take 325  mg by mouth daily.      Marland Kitchen BIMATOPROST 0.03 % OP SOLN  1 drop at bedtime.      Marland Kitchen BRIMONIDINE TARTRATE 0.15 % OP SOLN Right Eye Place 1 drop into the right eye 2 (two) times daily. 5 mL 0  . CO Q-10 50 MG PO CAPS Oral Take 1 capsule by mouth daily.      Marland Kitchen VITAMIN B-12 5000 MCG SL SUBL Sublingual Place 1 tablet under the tongue daily.      Marland Kitchen METOPROLOL SUCCINATE ER 50 MG PO TB24 Oral Take 1 tablet (50 mg total) by mouth daily. 30 tablet 11  . HAIR/SKIN/NAILS PO Oral Take 1 tablet by mouth daily.      Marland Kitchen NITROSTAT 0.4 MG SL SUBL  DISSOLVE ONE TABLET UNDER THE TONGUE EVERY 5 MINUTES AS NEEDED FOR CHEST PAIN.  DO NOT EXCEED A TOTAL OF 3 DOSES IN 15 MINUTES 25 each 6  . PLAVIX 75 MG PO TABS  TAKE ONE TABLET BY MOUTH EVERY DAY 30 each 6  . PRAVASTATIN SODIUM 40 MG PO TABS Oral Take 40 mg by mouth daily.    Marland Kitchen RAMIPRIL 10 MG PO CAPS  TAKE ONE CAPSULE BY MOUTH EVERY DAY 30 capsule 6  . TRAMADOL HCL 50 MG PO TABS Oral Take 1-2 tablets (50-100 mg total) by mouth 2 (two) times daily. Take 2 tablets every morning and take 1 tablet every evening 30 tablet 0    There were no vitals taken for this visit.  Physical Exam  Constitutional: She is oriented to person, place, and time. She appears well-developed and well-nourished.  HENT:  Head: Normocephalic.  Mouth/Throat: No oropharyngeal exudate.  Eyes: Conjunctivae and EOM are normal. Pupils are equal, round, and reactive to light.  Neck: Normal range of motion. Neck supple.  Cardiovascular: Normal rate, regular rhythm, normal heart sounds and intact distal pulses.  Exam reveals no gallop and no friction rub.   No murmur heard. Pulmonary/Chest: Effort normal and breath sounds normal. No respiratory distress. She has no wheezes.  Abdominal: Soft. Bowel sounds are normal. There is no tenderness.  Musculoskeletal: Normal range of motion. She exhibits no edema and no tenderness.  Neurological: She is alert and oriented to person, place, and time. No cranial nerve  deficit or sensory deficit.       3/5 strength initially in LUE/LLE. 5/5 in RUE/RLE.  Skin: Skin is warm and dry.  Psychiatric: She has a normal mood and affect. Her behavior is normal.    ED Course  Procedures (including critical care time)  Labs Reviewed - No data to display No results found.   No diagnosis found.    MDM  10:05 PM 78 y.o. female presents as code stroke after fall at [employer]. Family reports pt fell onto right  side while walking out of the store. Pt denies syncope, but is unsure why she fell. On arrival pt had weakness in her LUE/LLE. Proceeded w/ CT scan of head/neck. On re-eval after CT, Neuro physician notes NIHSS of 0. On re-eval by me, pt mentating well, no other trauma noted except for mild abrasions. Suspect TIA. Consulted medicine for admission.   Clinical Impression 1. TIA (transient ischemic attack)   2. Cindy Kraft, MD 08/26/11 [phone]

## 2011-08-25 NOTE — ED Notes (Signed)
CKMB 8.2 reported to resident at bedside.

## 2011-08-25 NOTE — Code Documentation (Signed)
78 yo bf brought in via [facility] with stroke symptoms.  Pt was  walking at [place] with her dtr when she fell.  After the fall she began to c/o LUE weakness, numbness, & hand pain & LLE weakness.  Code stroke called 2133, pt arrival 2150, EDP exam 2151, stroke team arrival 2138, LSN 2100, pt arrival in CT 2154, phlebotomist arrival 2153, CT read by neurologist 2206, code stroke cancelled 2215. NIH 0

## 2011-08-25 NOTE — ED Notes (Signed)
Code Stroke Called: 2133 Patient Arrival: 2150 EDP Exam: 2151 Stroke Team Arrival: 2138 Last Seen Normal: 2100 Pt arrival in CT: 2154 Phlebotomist Arrival: 2153 [place]: 2206 Stroke Canceled: 2215

## 2011-08-26 ENCOUNTER — Encounter (HOSPITAL_COMMUNITY): Payer: Self-pay | Admitting: Internal Medicine

## 2011-08-26 ENCOUNTER — Inpatient Hospital Stay (HOSPITAL_COMMUNITY): Payer: Medicare Other

## 2011-08-26 DIAGNOSIS — R51 Headache: Secondary | ICD-10-CM | POA: Diagnosis not present

## 2011-08-26 DIAGNOSIS — M810 Age-related osteoporosis without current pathological fracture: Secondary | ICD-10-CM | POA: Diagnosis present

## 2011-08-26 DIAGNOSIS — E785 Hyperlipidemia, unspecified: Secondary | ICD-10-CM | POA: Diagnosis not present

## 2011-08-26 DIAGNOSIS — Z823 Family history of stroke: Secondary | ICD-10-CM | POA: Diagnosis not present

## 2011-08-26 DIAGNOSIS — E213 Hyperparathyroidism, unspecified: Secondary | ICD-10-CM | POA: Diagnosis not present

## 2011-08-26 DIAGNOSIS — M4712 Other spondylosis with myelopathy, cervical region: Secondary | ICD-10-CM | POA: Diagnosis not present

## 2011-08-26 DIAGNOSIS — I2510 Atherosclerotic heart disease of native coronary artery without angina pectoris: Secondary | ICD-10-CM | POA: Diagnosis not present

## 2011-08-26 DIAGNOSIS — M542 Cervicalgia: Secondary | ICD-10-CM | POA: Diagnosis not present

## 2011-08-26 DIAGNOSIS — R5381 Other malaise: Secondary | ICD-10-CM | POA: Diagnosis present

## 2011-08-26 DIAGNOSIS — G459 Transient cerebral ischemic attack, unspecified: Secondary | ICD-10-CM

## 2011-08-26 DIAGNOSIS — M47812 Spondylosis without myelopathy or radiculopathy, cervical region: Secondary | ICD-10-CM | POA: Diagnosis present

## 2011-08-26 DIAGNOSIS — S8000XA Contusion of unspecified knee, initial encounter: Secondary | ICD-10-CM | POA: Diagnosis present

## 2011-08-26 DIAGNOSIS — I059 Rheumatic mitral valve disease, unspecified: Secondary | ICD-10-CM

## 2011-08-26 DIAGNOSIS — I739 Peripheral vascular disease, unspecified: Secondary | ICD-10-CM | POA: Diagnosis present

## 2011-08-26 DIAGNOSIS — I10 Essential (primary) hypertension: Secondary | ICD-10-CM | POA: Diagnosis not present

## 2011-08-26 DIAGNOSIS — I252 Old myocardial infarction: Secondary | ICD-10-CM | POA: Diagnosis not present

## 2011-08-26 DIAGNOSIS — R569 Unspecified convulsions: Secondary | ICD-10-CM | POA: Diagnosis not present

## 2011-08-26 DIAGNOSIS — Z8673 Personal history of transient ischemic attack (TIA), and cerebral infarction without residual deficits: Secondary | ICD-10-CM

## 2011-08-26 DIAGNOSIS — M7989 Other specified soft tissue disorders: Secondary | ICD-10-CM | POA: Diagnosis not present

## 2011-08-26 DIAGNOSIS — G8190 Hemiplegia, unspecified affecting unspecified side: Secondary | ICD-10-CM

## 2011-08-26 DIAGNOSIS — M79609 Pain in unspecified limb: Secondary | ICD-10-CM | POA: Diagnosis present

## 2011-08-26 DIAGNOSIS — E039 Hypothyroidism, unspecified: Secondary | ICD-10-CM | POA: Diagnosis present

## 2011-08-26 DIAGNOSIS — S0003XA Contusion of scalp, initial encounter: Secondary | ICD-10-CM | POA: Diagnosis present

## 2011-08-26 LAB — RAPID URINE DRUG SCREEN, HOSP PERFORMED
Amphetamines: NOT DETECTED
Barbiturates: NOT DETECTED
Benzodiazepines: NOT DETECTED
Cocaine: NOT DETECTED

## 2011-08-26 LAB — LIPID PANEL
LDL Cholesterol: 89 mg/dL (ref 0–99)
Total CHOL/HDL Ratio: 2.4 RATIO
VLDL: 12 mg/dL (ref 0–40)

## 2011-08-26 LAB — CBC
MCH: 31.6 pg (ref 26.0–34.0)
MCHC: 33 g/dL (ref 30.0–36.0)
MCV: 95.9 fL (ref 78.0–100.0)
Platelets: 137 10*3/uL — ABNORMAL LOW (ref 150–400)
RDW: 12.7 % (ref 11.5–15.5)
WBC: 7.7 10*3/uL (ref 4.0–10.5)

## 2011-08-26 LAB — COMPREHENSIVE METABOLIC PANEL
Albumin: 3.2 g/dL — ABNORMAL LOW (ref 3.5–5.2)
Alkaline Phosphatase: 78 U/L (ref 39–117)
BUN: 13 mg/dL (ref 6–23)
Chloride: 107 mEq/L (ref 96–112)
Potassium: 3.8 mEq/L (ref 3.5–5.1)
Total Bilirubin: 0.2 mg/dL — ABNORMAL LOW (ref 0.3–1.2)

## 2011-08-26 LAB — HEMOGLOBIN A1C: Mean Plasma Glucose: 123 mg/dL — ABNORMAL HIGH (ref <117)

## 2011-08-26 LAB — URIC ACID: Uric Acid, Serum: 6.1 mg/dL (ref 2.4–7.0)

## 2011-08-26 LAB — GLUCOSE, CAPILLARY
Glucose-Capillary: 99 mg/dL (ref 70–99)
Glucose-Capillary: 105 mg/dL — ABNORMAL HIGH (ref 70–99)

## 2011-08-26 MED ORDER — ENOXAPARIN SODIUM 30 MG/0.3ML ~~LOC~~ SOLN
30.0000 mg | SUBCUTANEOUS | Status: DC
  Administered 2011-08-26 – 2011-08-28 (×3): 30 mg via SUBCUTANEOUS
  Filled 2011-08-26 (×4): qty 0.3

## 2011-08-26 MED ORDER — ASPIRIN 325 MG PO TABS: 325.0000 mg | ORAL_TABLET | Freq: Every day | ORAL | Status: DC

## 2011-08-26 MED ORDER — NITROGLYCERIN 0.4 MG SL SUBL: 0.4000 mg | SUBLINGUAL_TABLET | SUBLINGUAL | Status: DC | PRN

## 2011-08-26 MED ORDER — BIMATOPROST 0.03 % OP SOLN
1.0000 [drp] | Freq: Every day | OPHTHALMIC | Status: DC
  Administered 2011-08-26 – 2011-08-28 (×3): 1 [drp] via OPHTHALMIC
  Filled 2011-08-26: qty 2.5

## 2011-08-26 MED ORDER — BRIMONIDINE TARTRATE 0.2 % OP SOLN
1.0000 [drp] | Freq: Two times a day (BID) | OPHTHALMIC | Status: DC
  Administered 2011-08-26 – 2011-08-29 (×7): 1 [drp] via OPHTHALMIC
  Filled 2011-08-26: qty 5

## 2011-08-26 MED ORDER — CLOPIDOGREL BISULFATE 75 MG PO TABS
75.0000 mg | ORAL_TABLET | Freq: Every day | ORAL | Status: DC
  Administered 2011-08-26 – 2011-08-29 (×4): 75 mg via ORAL
  Filled 2011-08-26 (×5): qty 1

## 2011-08-26 MED ORDER — TETANUS-DIPHTH-ACELL PERTUSSIS 5-2.5-18.5 LF-MCG/0.5 IM SUSP
0.5000 mL | Freq: Once | INTRAMUSCULAR | Status: AC
  Administered 2011-08-26: 0.5 mL via INTRAMUSCULAR
  Filled 2011-08-26: qty 0.5

## 2011-08-26 MED ORDER — METOPROLOL SUCCINATE ER 50 MG PO TB24
50.0000 mg | ORAL_TABLET | Freq: Every day | ORAL | Status: DC
  Administered 2011-08-26 – 2011-08-29 (×4): 50 mg via ORAL
  Filled 2011-08-26 (×4): qty 1

## 2011-08-26 MED ORDER — BRIMONIDINE TARTRATE 0.15 % OP SOLN
1.0000 [drp] | Freq: Two times a day (BID) | OPHTHALMIC | Status: DC
  Filled 2011-08-26: qty 5

## 2011-08-26 MED ORDER — TRAMADOL HCL 50 MG PO TABS
50.0000 mg | ORAL_TABLET | Freq: Two times a day (BID) | ORAL | Status: DC
  Administered 2011-08-26 (×2): 50 mg via ORAL
  Filled 2011-08-26: qty 2
  Filled 2011-08-26 (×2): qty 1
  Filled 2011-08-26: qty 2

## 2011-08-26 MED ORDER — ASPIRIN 325 MG PO TABS
325.0000 mg | ORAL_TABLET | Freq: Every day | ORAL | Status: DC
  Administered 2011-08-26 – 2011-08-29 (×4): 325 mg via ORAL
  Filled 2011-08-26 (×4): qty 1

## 2011-08-26 MED ORDER — RAMIPRIL 10 MG PO CAPS
10.0000 mg | ORAL_CAPSULE | Freq: Every day | ORAL | Status: DC
  Administered 2011-08-26 – 2011-08-29 (×4): 10 mg via ORAL
  Filled 2011-08-26 (×4): qty 1

## 2011-08-26 MED ORDER — OXYCODONE HCL 5 MG PO TABS
5.0000 mg | ORAL_TABLET | ORAL | Status: DC | PRN
  Administered 2011-08-26 – 2011-08-28 (×5): 5 mg via ORAL
  Filled 2011-08-26 (×5): qty 1

## 2011-08-26 MED ORDER — SIMVASTATIN 20 MG PO TABS
20.0000 mg | ORAL_TABLET | Freq: Every day | ORAL | Status: DC
  Administered 2011-08-26 – 2011-08-28 (×3): 20 mg via ORAL
  Filled 2011-08-26 (×4): qty 1

## 2011-08-26 MED ORDER — SODIUM CHLORIDE 0.9 % IV SOLN
INTRAVENOUS | Status: DC
  Administered 2011-08-26: 04:00:00 via INTRAVENOUS

## 2011-08-26 MED ORDER — HYDROCODONE-ACETAMINOPHEN 5-325 MG PO TABS
1.0000 | ORAL_TABLET | Freq: Three times a day (TID) | ORAL | Status: DC
  Administered 2011-08-26 – 2011-08-29 (×10): 1 via ORAL
  Filled 2011-08-26 (×10): qty 1

## 2011-08-26 MED ORDER — HYDROCODONE-ACETAMINOPHEN 5-325 MG PO TABS
1.0000 | ORAL_TABLET | Freq: Four times a day (QID) | ORAL | Status: DC | PRN
  Administered 2011-08-26: 1 via ORAL
  Filled 2011-08-26: qty 1

## 2011-08-26 NOTE — Progress Notes (Signed)
Stroke Team Progress Note  HISTORY [name] is an 78 y.o. female who was walking behind her granddaughter 08/25/2011. Without warning fell. No dizziness or lightheadedness. After falling was noted to have left upper and lower extremity weakness. Left upper extremity was numb as well. No facial complaints. Patient was brought in by EMS as a code stroke. Patient was not a TPA candidate secondary to rapid improvement in symptoms. She was admitted for further evaluation and treatment.  SUBJECTIVE Her granddaughter, and another family member (?daughter) is at the bedside.  Overall she feels her condition is gradually improving.   OBJECTIVE Most recent Vital Signs: Filed Vitals:   08/25/11 2330 08/26/11 0252 08/26/11 0504 08/26/11 0700  BP: 166/81 160/72 153/74   Pulse: 75 70 72   Temp:  98.6 F (37 C) 98.6 F (37 C)   TempSrc:  Oral Oral   Resp: 15 20 20    Height:    5\' 1"  (1.549 m)  Weight:    52.481 kg (115 lb 11.2 oz)  SpO2: 100% 94% 95%    CBG (last 3)   Basename 08/26/11 0702 08/25/11 2234  GLUCAP 99 100*   Intake/Output from previous day:   IV Fluid Intake:     . sodium chloride 75 mL/hr at 08/26/11 0409   MEDICATIONS    . aspirin  325 mg Oral Daily  . bimatoprost  1 drop Both Eyes QHS  . brimonidine  1 drop Right Eye BID  . clopidogrel  75 mg Oral Daily  . enoxaparin  30 mg Subcutaneous Q24H  . HYDROcodone-acetaminophen  1 tablet Oral TID  . metoprolol succinate  50 mg Oral Daily  . ramipril  10 mg Oral Daily  . simvastatin  20 mg Oral q1800  . TDaP  0.5 mL Intramuscular Once  . DISCONTD: aspirin  325 mg Oral Daily  . DISCONTD: brimonidine  1 drop Right Eye BID  . DISCONTD: traMADol  50-100 mg Oral BID   PRN:  nitroGLYCERIN, oxyCODONE, DISCONTD: HYDROcodone-acetaminophen  Diet:  Cardiac thin liquids Activity:   Bathroom privileges with assistance DVT Prophylaxis:  Lovenox 30 mg sq daily   CLINICALLY SIGNIFICANT STUDIES Basic Metabolic Panel:  Lab  [id number] 08/25/11 2239 08/25/11 2152  NA 139 143 --  K 3.8 4.2 --  CL 107 110 --  CO2 26 -- 26  GLUCOSE 101* 114* --  BUN 13 17 --  CREATININE 0.79 0.90 --  CALCIUM 10.4 -- 11.1*  MG -- -- --  PHOS -- -- --   Liver Function Tests:  Lab 08/26/11 0625 08/25/11 2152  AST 23 27  ALT 15 16  ALKPHOS 78 94  BILITOT 0.2* 0.2*  PROT 6.4 7.0  ALBUMIN 3.2* 3.7   CBC:  Lab 08/26/11 0625 08/25/11 2239 08/25/11 2152  WBC 7.7 -- 6.0  NEUTROABS -- -- 2.4  HGB 11.6* 12.6 --  HCT 35.2* 37.0 --  MCV 95.9 -- 96.1  PLT 137* -- 143*   Coagulation:  Lab 08/25/11 2152  LABPROT 13.0  INR 0.96   Cardiac Enzymes:  Lab 08/25/11 2152  CKTOTAL 293*  CKMB 8.2*  CKMBINDEX --  TROPONINI <0.30   Lipid Panel    Component Value Date/Time   CHOL 171 08/26/2011 0625   TRIG 62 08/26/2011 0625   HDL 70 08/26/2011 0625   CHOLHDL 2.4 08/26/2011 0625   VLDL 12 08/26/2011 0625   LDLCALC 89 08/26/2011 0625   HgbA1C  No results found for this basename: HGBA1C  Urine Drug Screen:     Component Value Date/Time   LABOPIA NONE DETECTED 08/26/2011 0004   COCAINSCRNUR NONE DETECTED 08/26/2011 0004   LABBENZ NONE DETECTED 08/26/2011 0004   AMPHETMU NONE DETECTED 08/26/2011 0004   THCU NONE DETECTED 08/26/2011 0004   LABBARB NONE DETECTED 08/26/2011 0004    Alcohol Level: No results found for this basename: ETH:2 in the last 168 hours  Ct Cervical Spine Wo Contrast  08/25/2011   1.  Advance cervical spondylosis without fracture or acute subluxation observed.   CT of the brain  08/25/2011  1.  No acute intracranial findings. 2.  Mild scalp hematoma along the center of the forehead. 3.  Mild chronic maxillary and ethmoid sinusitis.    MRI of the brain  pending  MRA of the brain  pending 2D Echocardiogram  completed  Carotid Doppler  No internal carotid artery stenosis bilaterally. Vertebrals with antegrade flow bilaterally.   CXR    EKG  normal sinus rhythm.   Therapy Recommendations PT -, OT  -  Physical Exam  frail elderly african american lady not in distress. Awake alert. Afebrile. Head shows scalp bruise on forehead, nose and right cheek.  Neck is supple without bruit. Hearing is normal. Cardiac exam no murmur or gallop. Lungs are clear to auscultation. Distal pulses are well felt. Neurological exam ; Awake  Alert oriented x 3.Diminished recall and attention. Normal speech and language.eye movements full without nystagmus. Face symmetric. Tongue midline. Normal strength, tone, reflexes and coordination. Normal sensation. Gait deferred.   ASSESSMENT [name] is a 78 y.o. female with a fall without warning. Concern for possible stroke; workup underway. On aspirin 325 mg orally every day and clopidogrel 75 mg orally every day prior to admission. Now on aspirin 325 mg orally every day and clopidogrel 75 mg orally every day for secondary stroke prevention. Patient with resultant overall weakness  -CAD, MI, hx stent, last cath 2011 -hyperlipidemia -hypertension -PVD -RAD -hx medication noncompliance -LLE phlebitis  Hospital day # 1  TREATMENT/PLAN -Continue aspirin 325 mg orally every day and clopidogrel 75 mg orally every day for now given cardiac history -complete stroke workup if negative may need cardiac w/u for syncope -therapy evals -D/w patient and family Burnetta Sabin, AVNP, ANP-BC, GNP-BC [facility] Pager: [phone] 08/26/2011 10:37 AM  Scribe for Dr. Antony Contras, [place] Director. He has personally reviewed chart, pertinent data, examined the patient and developed the plan of care. Pager:  [phone]

## 2011-08-26 NOTE — Progress Notes (Signed)
Patient transported by [facility] to MRI and Xray.  Removed tele (per order)

## 2011-08-26 NOTE — Progress Notes (Signed)
VASCULAR LAB PRELIMINARY  PRELIMINARY  PRELIMINARY  PRELIMINARY  Carotid duplex  completed.    Preliminary report:  Bilateral:  No evidence of hemodynamically significant internal carotid artery stenosis.   Vertebral artery flow is antegrade.      Judithann Sauger, RVT 08/26/2011, 10:07 AM

## 2011-08-26 NOTE — Progress Notes (Signed)
  Echocardiogram 2D Echocardiogram has been performed.  Brock Mokry, Orlena Sheldon 08/26/2011, 11:23 AM

## 2011-08-26 NOTE — Progress Notes (Signed)
Subjective: Feels better this AM, although still has some pain.  Objective: Vital signs in last 24 hours: Temp:  [98.2 F (36.8 C)-98.6 F (37 C)] 98.6 F (37 C) (05/13 0504) Pulse Rate:  [70-75] 72  (05/13 0504) Resp:  [11-20] 20  (05/13 0504) BP: (153-166)/(72-81) 153/74 mmHg (05/13 0504) SpO2:  [94 %-100 %] 95 % (05/13 0504) Weight change:  Last BM Date: 08/25/11  Intake/Output from previous day:       Physical Exam: General: Comfortable, alert, communicative, fully oriented, not short of breath at rest.  HEENT:  No clinical pallor, no jaundice, no conjunctival injection or discharge. Has obvious forehead bruising, and nasal/facial excoriations. Hydration status seems fair. NECK:  Supple, JVP not seen, no carotid bruits, no palpable lymphadenopathy, no palpable goiter. CHEST:  Clinically clear to auscultation, no wheezes, no crackles. HEART:  Sounds 1 and 2 heard, normal, regular, no murmurs. ABDOMEN:  Full, soft, non-tender, no palpable organomegaly, no palpable masses, normal bowel sounds. GENITALIA:  Not examined. LOWER EXTREMITIES:  No pitting edema, palpable peripheral pulses. Has abrasion of left knee. MUSCULOSKELETAL SYSTEM:  Generalized osteoarthritic changes, otherwise, normal. CENTRAL NERVOUS SYSTEM:  Patient has weakness of hand grip bilaterally, but obviously worse on left. Planter response is slightly up-going on left, otherwise, no focal neurologic deficit on gross examination.  Lab Results:  Basename 08/26/11 0625 08/25/11 2239 08/25/11 2152  WBC 7.7 -- 6.0  HGB 11.6* 12.6 --  HCT 35.2* 37.0 --  PLT 137* -- 143*    Basename 08/26/11 0625 08/25/11 2239 08/25/11 2152  NA 139 143 --  K 3.8 4.2 --  CL 107 110 --  CO2 26 -- 26  GLUCOSE 101* 114* --  BUN 13 17 --  CREATININE 0.79 0.90 --  CALCIUM 10.4 -- 11.1*   No results found for this or any previous visit (from the past 240 hour(s)).   Studies/Results: Ct Head Wo Contrast  08/25/2011  *RADIOLOGY  REPORT*  Clinical Data:  Code stroke.  Fall.  Left-sided weakness.  Pain.  CT HEAD WITHOUT CONTRAST CT CERVICAL SPINE WITHOUT CONTRAST  Technique:  Multidetector CT imaging of the head and cervical spine was performed following the standard protocol without intravenous contrast.  Multiplanar CT image reconstructions of the cervical spine were also generated.  Comparison:  Report from 03/08/2002  CT HEAD  Findings: The brain stem, cerebellum, cerebral peduncles, thalami, basal ganglia, basilar cisterns, and ventricular system appear unremarkable.  No intracranial hemorrhage, mass lesion, or acute infarction is identified.  Mild scalp hematoma along the center of the forehead noted.  There is mild chronic maxillary and ethmoid sinusitis.  IMPRESSION:  1.  No acute intracranial findings. 2.  Mild scalp hematoma along the center of the forehead. 3.  Mild chronic maxillary and ethmoid sinusitis.  CT CERVICAL SPINE  Findings: Prominent cervical spondylosis and degenerative disc disease noted with multilevel calcified intervertebral discs, prominent loss of disc space at C5-6 with endplate sclerosis, posterior osseous ridging at all levels in the cervical spine, and prominent anterior intervertebral spurring at C5-6, C6-7, and C7- T1.  Facet arthropathy is most prominent on the left at C3-4, C4-5, and C5-6, and on the right that CT three, C3-4, and C4-5.  Mildly exaggerated upper cervical lordosis noted.  There is likely some central stenosis at C2-3 and C5-6 weighted to disc osteophyte complexes.  Erosive arthropathy of the medial clavicle noted.  No cervical spine fracture or acute subluxation is observed.  IMPRESSION:  1.  Advance cervical  spondylosis without fracture or acute subluxation observed.  Critical Value/emergent results were called by telephone to Dr. Delora Fuel (who verbally acknowledged these results) at the time of interpretation on 08/25/2011 at 10:25 p.m.  Original Report Authenticated By: Carron Curie, M.D.   Ct Cervical Spine Wo Contrast  08/25/2011  *RADIOLOGY REPORT*  Clinical Data:  Code stroke.  Fall.  Left-sided weakness.  Pain.  CT HEAD WITHOUT CONTRAST CT CERVICAL SPINE WITHOUT CONTRAST  Technique:  Multidetector CT imaging of the head and cervical spine was performed following the standard protocol without intravenous contrast.  Multiplanar CT image reconstructions of the cervical spine were also generated.  Comparison:  Report from 03/08/2002  CT HEAD  Findings: The brain stem, cerebellum, cerebral peduncles, thalami, basal ganglia, basilar cisterns, and ventricular system appear unremarkable.  No intracranial hemorrhage, mass lesion, or acute infarction is identified.  Mild scalp hematoma along the center of the forehead noted.  There is mild chronic maxillary and ethmoid sinusitis.  IMPRESSION:  1.  No acute intracranial findings. 2.  Mild scalp hematoma along the center of the forehead. 3.  Mild chronic maxillary and ethmoid sinusitis.  CT CERVICAL SPINE  Findings: Prominent cervical spondylosis and degenerative disc disease noted with multilevel calcified intervertebral discs, prominent loss of disc space at C5-6 with endplate sclerosis, posterior osseous ridging at all levels in the cervical spine, and prominent anterior intervertebral spurring at C5-6, C6-7, and C7- T1.  Facet arthropathy is most prominent on the left at C3-4, C4-5, and C5-6, and on the right that CT three, C3-4, and C4-5.  Mildly exaggerated upper cervical lordosis noted.  There is likely some central stenosis at C2-3 and C5-6 weighted to disc osteophyte complexes.  Erosive arthropathy of the medial clavicle noted.  No cervical spine fracture or acute subluxation is observed.  IMPRESSION:  1.  Advance cervical spondylosis without fracture or acute subluxation observed.  Critical Value/emergent results were called by telephone to Dr. Delora Fuel (who verbally acknowledged these results) at the time of interpretation  on 08/25/2011 at 10:25 p.m.  Original Report Authenticated By: Carron Curie, M.D.    Medications: Scheduled Meds:   . aspirin  325 mg Oral Daily  . aspirin  325 mg Oral Daily  . bimatoprost  1 drop Both Eyes QHS  . brimonidine  1 drop Right Eye BID  . clopidogrel  75 mg Oral Daily  . enoxaparin  30 mg Subcutaneous Q24H  . metoprolol succinate  50 mg Oral Daily  . ramipril  10 mg Oral Daily  . simvastatin  20 mg Oral q1800  . TDaP  0.5 mL Intramuscular Once  . traMADol  50-100 mg Oral BID  . DISCONTD: brimonidine  1 drop Right Eye BID   Continuous Infusions:   . sodium chloride 75 mL/hr at 08/26/11 0409   PRN Meds:.HYDROcodone-acetaminophen, nitroGLYCERIN  Assessment/Plan:  1. TIA vs CVA: Patient still appears to have residual left upper extremity weakness, as well as a positive left plantar response. I am inclined to favor an acute CVA at this point. MRI/MRA brain, carotid Doppler, 2-D echo are pending. Meanwhile, continue Aspirin/Plavix and neuro checks. We shall check lipid profile, as part of risk factor modification. 2. Fall: This is secondary to #1, and is complicated by scalp hematoma, facial excoriations an d UE/LE bruising and left knee abrasion. Fortunately, there appear to be no bony injuries. We shall manage with scheduled analgesics.   3. Cervical spondylosis. Patient has bilateral hand weakness, albeit  Lt>Rt. Cervical spine CT, reveals advanced cervical spondylosis. Likely, patient has some cervical spondylotic myelopathy. Neurosurgical consultation may prove beneficial, in due course. 4. CAD status post stenting: Patient appears stable from this viewpoint. She has no chest pain or dyspnea. Continue present medications.  5. Peripheral vascular disease status post left-sided femoro-popliteal bypass: Stable.  6. Hyperparathyroidism with hypercalcemia: This was diagnosed in 2003. Defer to primary MD. 7. Hypertension: BP is sub-optimally controlled. We shall continue  pre-admission antihypertensives, for now, and adjust as indicated.      LOS: 1 day   [name],CHRISTOPHER 08/26/2011, 8:38 AM

## 2011-08-26 NOTE — Care Management Note (Signed)
    Page 1 of 2   08/29/2011     11:47:13 AM   CARE MANAGEMENT NOTE 08/29/2011  Patient:  [name], [name]   Account Number:  [id number]  Date Initiated:  08/26/2011  Documentation initiated by:  Lars Pinks  Subjective/Objective Assessment:   PT WAS ADMITTED WITH CVA     Action/Plan:   PROGRESSION OF CARE AND DISCHARGE PLANNING   Anticipated DC Date:  08/29/2011   Anticipated DC Plan:  [place]  CM consult      Choice offered to / List presented to:  C-1 Patient        [place] arranged  [place]   Status of service:  Completed, signed off Medicare Important Message given?   (If response is "NO", the following Medicare IM given date fields will be blank) Date Medicare IM given:   Date Additional Medicare IM given:    Discharge Disposition:  [place]  Per UR Regulation:  Reviewed for med. necessity/level of care/duration of stay  If discussed at [place] of Stay Meetings, dates discussed:    Comments:  08/29/11 Lars Pinks, RN, [place].  PT STATED THAT SHE DOESNT NEED A CANE AS SHE HAS IT AT HOME ALREADY.  08/28/11 Lars Pinks, RN, BSN 1550 PT WAS SET UP WITH [facility] FOR [facility] PT/OT.  08/26/11 Lars Pinks, RN, BSN 80 PT WAS ADMITTED WITH TIA VS CVA, PT APT WAS AT HOME WITH FAMILY CARE.  PT WOULD LIKE TO RETURN TO HOME AT DC.  WILL F/U ON PT/OT EVAL

## 2011-08-26 NOTE — H&P (Signed)
[name] is an 78 y.o. female.   PCP - Dr.Perry in [place]. Chief Complaint: Fall with left-sided weakness. HPI: 78 year-old female with known history of seizures for stenting, peripheral vascular disease status post left-sided femoral popliteal bypass, hyperparathyroidism and recent admission in January 2013 for ischemic colitis [place] shopping [place] when she was walking behind her granddaughter she suddenly fell and started experiencing left-sided weakness both upper and lower extremity. She was brought to the ER as a code stroke. CT was negative for any acute and neurologist canceled the code as patient's symptoms are completely resolved by the time patient came in. Presently patient denies any focal deficits, did not have any difficulty seeing, swallowing, speaking. She has some pain in both her palms and difficult making a fist after the fall.  Past Medical History  Diagnosis Date  . CAD (coronary artery disease) 2003, 2010    s/p stenting of RCA x2, Last cardiac cath in 2011 with 90% occlusion in obtuse marginal but too unstable to stent, with no stenting, last Echo 2010 showing normal EF 60-65% with grade 2 diastolic dysfunction   . Hyperlipidemia   . HTN (hypertension)   . PVD (peripheral vascular disease)     s/p left fem-pop bypass graft  . Hyperparathyroidism 2003    parathyroid adenoma localized to the right inferior thyroid lobe region  . Renal artery stenosis     left, 40%  . Osteoporosis 2009    diagnosed by bone density scan in 2009, on Vit D only, no bisphosphonate therapy  . Bacteremia 2003    hx of staph bacteremia  . Degenerative disc disease   . Lumbar stenosis with neurogenic claudication 2006    s/p decompression L2-L5  . Abscess 2003    I&D SCM abscess  . Arthritis   . History of medication noncompliance     concerning Plavix  . Depression   . Phlebitis     LLE  . Angina   . Myocardial infarction 2010; 2011  . Shortness of breath on exertion   .  Hypothyroidism   . Acute ischemic colitis 04/10/11    acute episode of ischemic colitis, with abd pain, CT evidence of transverse colitis, and blood streaked BM.  Resolved with conservative treatment.    Past Surgical History  Procedure Date  . Appendectomy   . Femoral bypass before 2003    LLE  . Incision and drainage anterior neck 10/2001    absces sternoclavicular joint  . Coronary angioplasty with stent placement 09/2008  . Coronary angioplasty with stent placement 04/2001  . Back surgery 06/2004    L3, L4 lami; L2, L5 hemilami; decompresion L2-3, 3-4, 4-5    Family History  Problem Relation Age of Onset  . Stroke Mother   . Stroke Father    Social History:  reports that she has never smoked. She has never used smokeless tobacco. She reports that she does not drink alcohol or use illicit drugs.  Allergies:  Allergies  Allergen Reactions  . Diphenhydramine Hcl Other (See Comments)    unknown  . Penicillins Other (See Comments)    unknown  . Shellfish Allergy Other (See Comments)    unknown    Medications Prior to Admission  Medication Sig Dispense Refill  . aspirin 325 MG tablet Take 325 mg by mouth daily.        . bimatoprost (LUMIGAN) 0.03 % ophthalmic drops Apply 1 drop to eye at bedtime.       Marland Kitchen  brimonidine (ALPHAGAN) 0.15 % ophthalmic solution Place 1 drop into the right eye 2 (two) times daily.  5 mL  0  . clopidogrel (PLAVIX) 75 MG tablet Take 75 mg by mouth daily.      . Coenzyme Q10 (CO Q-10) 50 MG CAPS Take 1 capsule by mouth daily.        . Cyanocobalamin (VITAMIN B-12) 5000 MCG SUBL Place 1 tablet under the tongue daily.        . metoprolol (TOPROL-XL) 50 MG 24 hr tablet Take 1 tablet (50 mg total) by mouth daily.  30 tablet  11  . Multiple Vitamins-Minerals (HAIR/SKIN/NAILS PO) Take 1 tablet by mouth daily.        . nitroGLYCERIN (NITROSTAT) 0.4 MG SL tablet Place 0.4 mg under the tongue every 5 (five) minutes as needed. For chest pain      . pravastatin  (PRAVACHOL) 40 MG tablet Take 40 mg by mouth daily.      . ramipril (ALTACE) 10 MG capsule Take 10 mg by mouth daily.      . traMADol (ULTRAM) 50 MG tablet Take 1-2 tablets (50-100 mg total) by mouth 2 (two) times daily. Take 2 tablets every morning and take 1 tablet every evening  30 tablet  0    Results for orders placed during the hospital encounter of 08/25/11 (from the past 48 hour(s))  PROTIME-INR     Status: Normal   Collection Time   08/25/11  9:52 PM      Component Value Range Comment   Prothrombin Time 13.0  11.6 - 15.2 (seconds)    INR 0.96  0.00 - 1.49    APTT     Status: Normal   Collection Time   08/25/11  9:52 PM      Component Value Range Comment   aPTT 27  24 - 37 (seconds)   CBC     Status: Abnormal   Collection Time   08/25/11  9:52 PM      Component Value Range Comment   WBC 6.0  4.0 - 10.5 (K/uL)    RBC 3.80 (*) 3.87 - 5.11 (MIL/uL)    Hemoglobin 11.9 (*) 12.0 - 15.0 (g/dL)    HCT 36.5  36.0 - 46.0 (%)    MCV 96.1  78.0 - 100.0 (fL)    MCH 31.3  26.0 - 34.0 (pg)    MCHC 32.6  30.0 - 36.0 (g/dL)    RDW 12.7  11.5 - 15.5 (%)    Platelets 143 (*) 150 - 400 (K/uL)   DIFFERENTIAL     Status: Abnormal   Collection Time   08/25/11  9:52 PM      Component Value Range Comment   Neutrophils Relative 40 (*) 43 - 77 (%)    Neutro Abs 2.4  1.7 - 7.7 (K/uL)    Lymphocytes Relative 45  12 - 46 (%)    Lymphs Abs 2.7  0.7 - 4.0 (K/uL)    Monocytes Relative 11  3 - 12 (%)    Monocytes Absolute 0.7  0.1 - 1.0 (K/uL)    Eosinophils Relative 3  0 - 5 (%)    Eosinophils Absolute 0.2  0.0 - 0.7 (K/uL)    Basophils Relative 1  0 - 1 (%)    Basophils Absolute 0.0  0.0 - 0.1 (K/uL)   COMPREHENSIVE METABOLIC PANEL     Status: Abnormal   Collection Time   08/25/11  9:52 PM  Component Value Range Comment   Sodium 143  135 - 145 (mEq/L)    Potassium 4.2  3.5 - 5.1 (mEq/L)    Chloride 109  96 - 112 (mEq/L)    CO2 26  19 - 32 (mEq/L)    Glucose, Bld 109 (*) 70 - 99 (mg/dL)     BUN 17  6 - 23 (mg/dL)    Creatinine, Ser 0.94  0.50 - 1.10 (mg/dL)    Calcium 11.1 (*) 8.4 - 10.5 (mg/dL)    Total Protein 7.0  6.0 - 8.3 (g/dL)    Albumin 3.7  3.5 - 5.2 (g/dL)    AST 27  0 - 37 (U/L) HEMOLYSIS AT THIS LEVEL MAY AFFECT RESULT   ALT 16  0 - 35 (U/L)    Alkaline Phosphatase 94  39 - 117 (U/L)    Total Bilirubin 0.2 (*) 0.3 - 1.2 (mg/dL)    GFR calc non Af Amer 55 (*) >90 (mL/min)    GFR calc Af Amer 63 (*) >90 (mL/min)   CK TOTAL AND CKMB     Status: Abnormal   Collection Time   08/25/11  9:52 PM      Component Value Range Comment   Total CK 293 (*) 7 - 177 (U/L)    CK, MB 8.2 (*) 0.3 - 4.0 (ng/mL)    Relative Index 2.8 (*) 0.0 - 2.5    TROPONIN I     Status: Normal   Collection Time   08/25/11  9:52 PM      Component Value Range Comment   Troponin I <0.30  <0.30 (ng/mL)   GLUCOSE, CAPILLARY     Status: Abnormal   Collection Time   08/25/11 10:34 PM      Component Value Range Comment   Glucose-Capillary 100 (*) 70 - 99 (mg/dL)   POCT I-STAT, CHEM 8     Status: Abnormal   Collection Time   08/25/11 10:39 PM      Component Value Range Comment   Sodium 143  135 - 145 (mEq/L)    Potassium 4.2  3.5 - 5.1 (mEq/L)    Chloride 110  96 - 112 (mEq/L)    BUN 17  6 - 23 (mg/dL)    Creatinine, Ser 0.90  0.50 - 1.10 (mg/dL)    Glucose, Bld 114 (*) 70 - 99 (mg/dL)    Calcium, Ion 1.49 (*) 1.12 - 1.32 (mmol/L)    TCO2 26  0 - 100 (mmol/L)    Hemoglobin 12.6  12.0 - 15.0 (g/dL)    HCT 37.0  36.0 - 46.0 (%)   URINE RAPID DRUG SCREEN (HOSP PERFORMED)     Status: Normal   Collection Time   08/26/11 12:04 AM      Component Value Range Comment   Opiates NONE DETECTED  NONE DETECTED     Cocaine NONE DETECTED  NONE DETECTED     Benzodiazepines NONE DETECTED  NONE DETECTED     Amphetamines NONE DETECTED  NONE DETECTED     Tetrahydrocannabinol NONE DETECTED  NONE DETECTED     Barbiturates NONE DETECTED  NONE DETECTED     Ct Head Wo Contrast  08/25/2011  *RADIOLOGY REPORT*   Clinical Data:  Code stroke.  Fall.  Left-sided weakness.  Pain.  CT HEAD WITHOUT CONTRAST CT CERVICAL SPINE WITHOUT CONTRAST  Technique:  Multidetector CT imaging of the head and cervical spine was performed following the standard protocol without intravenous contrast.  Multiplanar CT image reconstructions  of the cervical spine were also generated.  Comparison:  Report from 03/08/2002  CT HEAD  Findings: The brain stem, cerebellum, cerebral peduncles, thalami, basal ganglia, basilar cisterns, and ventricular system appear unremarkable.  No intracranial hemorrhage, mass lesion, or acute infarction is identified.  Mild scalp hematoma along the center of the forehead noted.  There is mild chronic maxillary and ethmoid sinusitis.  IMPRESSION:  1.  No acute intracranial findings. 2.  Mild scalp hematoma along the center of the forehead. 3.  Mild chronic maxillary and ethmoid sinusitis.  CT CERVICAL SPINE  Findings: Prominent cervical spondylosis and degenerative disc disease noted with multilevel calcified intervertebral discs, prominent loss of disc space at C5-6 with endplate sclerosis, posterior osseous ridging at all levels in the cervical spine, and prominent anterior intervertebral spurring at C5-6, C6-7, and C7- T1.  Facet arthropathy is most prominent on the left at C3-4, C4-5, and C5-6, and on the right that CT three, C3-4, and C4-5.  Mildly exaggerated upper cervical lordosis noted.  There is likely some central stenosis at C2-3 and C5-6 weighted to disc osteophyte complexes.  Erosive arthropathy of the medial clavicle noted.  No cervical spine fracture or acute subluxation is observed.  IMPRESSION:  1.  Advance cervical spondylosis without fracture or acute subluxation observed.  Critical Value/emergent results were called by telephone to Dr. Delora Fuel (who verbally acknowledged these results) at the time of interpretation on 08/25/2011 at 10:25 p.m.  Original Report Authenticated By: Carron Curie,  M.D.   Ct Cervical Spine Wo Contrast  08/25/2011  *RADIOLOGY REPORT*  Clinical Data:  Code stroke.  Fall.  Left-sided weakness.  Pain.  CT HEAD WITHOUT CONTRAST CT CERVICAL SPINE WITHOUT CONTRAST  Technique:  Multidetector CT imaging of the head and cervical spine was performed following the standard protocol without intravenous contrast.  Multiplanar CT image reconstructions of the cervical spine were also generated.  Comparison:  Report from 03/08/2002  CT HEAD  Findings: The brain stem, cerebellum, cerebral peduncles, thalami, basal ganglia, basilar cisterns, and ventricular system appear unremarkable.  No intracranial hemorrhage, mass lesion, or acute infarction is identified.  Mild scalp hematoma along the center of the forehead noted.  There is mild chronic maxillary and ethmoid sinusitis.  IMPRESSION:  1.  No acute intracranial findings. 2.  Mild scalp hematoma along the center of the forehead. 3.  Mild chronic maxillary and ethmoid sinusitis.  CT CERVICAL SPINE  Findings: Prominent cervical spondylosis and degenerative disc disease noted with multilevel calcified intervertebral discs, prominent loss of disc space at C5-6 with endplate sclerosis, posterior osseous ridging at all levels in the cervical spine, and prominent anterior intervertebral spurring at C5-6, C6-7, and C7- T1.  Facet arthropathy is most prominent on the left at C3-4, C4-5, and C5-6, and on the right that CT three, C3-4, and C4-5.  Mildly exaggerated upper cervical lordosis noted.  There is likely some central stenosis at C2-3 and C5-6 weighted to disc osteophyte complexes.  Erosive arthropathy of the medial clavicle noted.  No cervical spine fracture or acute subluxation is observed.  IMPRESSION:  1.  Advance cervical spondylosis without fracture or acute subluxation observed.  Critical Value/emergent results were called by telephone to Dr. Delora Fuel (who verbally acknowledged these results) at the time of interpretation on  08/25/2011 at 10:25 p.m.  Original Report Authenticated By: Carron Curie, M.D.    Review of Systems  Constitutional: Negative.   HENT: Negative.   Eyes: Negative.   Respiratory: Negative.  Cardiovascular: Negative.   Gastrointestinal: Negative.   Genitourinary: Negative.   Musculoskeletal: Positive for falls.  Skin: Negative.   Neurological:       Weakness in left upper and lower extremity.  Endo/Heme/Allergies: Negative.   Psychiatric/Behavioral: Negative.     Blood pressure 166/81, pulse 75, temperature 98.2 F (36.8 C), temperature source Oral, resp. rate 15, SpO2 100.00%. Physical Exam  Constitutional: She is oriented to person, place, and time. She appears well-developed and well-nourished. No distress.  HENT:  Head: Normocephalic and atraumatic.  Right Ear: External ear normal.  Left Ear: External ear normal.  Nose: Nose normal.  Mouth/Throat: Oropharynx is clear and moist. No oropharyngeal exudate.  Eyes: Conjunctivae are normal. Pupils are equal, round, and reactive to light. Right eye exhibits no discharge. Left eye exhibits no discharge. No scleral icterus.  Neck: Normal range of motion. Neck supple.  Cardiovascular: Normal rate and regular rhythm.   Respiratory: Effort normal and breath sounds normal. No respiratory distress. She has no wheezes. She has no rales.  GI: Soft. Bowel sounds are normal. She exhibits no distension. There is no tenderness. There is no rebound.  Musculoskeletal: Normal range of motion. She exhibits no edema and no tenderness.  Neurological: She is alert and oriented to person, place, and time.       Mild weakness in left lower extremity. Otherwise 5/5 all other extremities. No facial asymmetry. Tongue is midline.  Skin: She is not diaphoretic.       Bruise in the left knee and nasal area.  Psychiatric: Her behavior is normal.     Assessment/Plan #1. Possible TIA - MRI/MRA brain, carotid Doppler, 2-D echo. Aspirin. Neuro checks  and swallow evaluation. #2. Bilateral hand pain after fall - get x-ray of both hand. Check uric acid level. #3. Bruise on the nasal area and left knee after fall - tetanus booster ordered. #4. CAD status post stenting - denies any chest pain. Continue present medications. #5. Peripheral vascular disease status post left-sided femoropopliteal bypass - denies any lower extremity pain. #6. Hyperparathyroidism with hypercalcemia - further workup as outpatient. #7. Hypertension - continue present medications.  CODE STATUS - full code.  Kimmie Berggren N. 08/26/2011, 2:07 AM

## 2011-08-26 NOTE — Progress Notes (Signed)
Patient returned from MRI and Xray.

## 2011-08-26 NOTE — ED Notes (Addendum)
Pt placed on bedpan - urine specimen collected.

## 2011-08-26 NOTE — Discharge Instructions (Signed)
STROKE/TIA DISCHARGE INSTRUCTIONS SMOKING Cigarette smoking nearly doubles your risk of having a stroke & is the single most alterable risk factor  If you smoke or have smoked in the last 12 months, you are advised to quit smoking for your health.  Most of the excess cardiovascular risk related to smoking disappears within a year of stopping.  Ask you doctor about anti-smoking medications  [place] Quit Line: 1-800-QUIT NOW  Free Smoking Cessation Classes [phone]  CHOLESTEROL Know your levels; limit fat & cholesterol in your diet  Lipid Panel     Component Value Date/Time   CHOL  Value: 189        ATP III CLASSIFICATION:  <200     mg/dL   Desirable  200-239  mg/dL   Borderline High  >=240    mg/dL   High        05/09/2009 0637   TRIG 148 05/09/2009 0637   HDL 61 05/09/2009 0637   CHOLHDL 3.1 05/09/2009 0637   VLDL 30 05/09/2009 0637   LDLCALC  Value: 98        Total Cholesterol/HDL:CHD Risk Coronary Heart Disease Risk Table                     Men   Women  1/2 Average Risk   3.4   3.3  Average Risk       5.0   4.4  2 X Average Risk   9.6   7.1  3 X Average Risk  23.4   11.0        Use the calculated Patient Ratio above and the CHD Risk Table to determine the patient's CHD Risk.        ATP III CLASSIFICATION (LDL):  <100     mg/dL   Optimal  100-129  mg/dL   Near or Above                    Optimal  130-159  mg/dL   Borderline  160-189  mg/dL   High  >190     mg/dL   Very High 05/09/2009 0637      Many patients benefit from treatment even if their cholesterol is at goal.  Goal: Total Cholesterol (CHOL) less than 160  Goal:  Triglycerides (TRIG) less than 150  Goal:  HDL greater than 40  Goal:  LDL (LDLCALC) less than 100   BLOOD PRESSURE American Stroke Association blood pressure target is less that 120/80 mm/Hg  Your discharge blood pressure is:  BP: 153/74 mmHg  Monitor your blood pressure  Limit your salt and alcohol intake  Many individuals will require more than one medication  for high blood pressure  DIABETES (A1c is a blood sugar average for last 3 months) Goal HGBA1c is under 7% (HBGA1c is blood sugar average for last 3 months)  Diabetes: {STROKE DC DIABETES:22357}    No results found for this basename: HGBA1C     Your HGBA1c can be lowered with medications, healthy diet, and exercise.  Check your blood sugar as directed by your physician  Call your physician if you experience unexplained or low blood sugars.  PHYSICAL ACTIVITY/REHABILITATION Goal is 30 minutes at least 4 days per week    {STROKE DC ACTIVITY/REHAB:22359}  Activity decreases your risk of heart attack and stroke and makes your heart stronger.  It helps control your weight and blood pressure; helps you relax and can improve your mood.  Participate in a regular  exercise program.  Talk with your doctor about the best form of exercise for you (dancing, walking, swimming, cycling).  DIET/WEIGHT Goal is to maintain a healthy weight  Your discharge diet is: Cardiac *** liquids Your height is:    Your current weight is:   Your Body Mass Index (BMI) is:     Following the type of diet specifically designed for you will help prevent another stroke.  Your goal weight range is:  ***  Your goal Body Mass Index (BMI) is 19-24.  Healthy food habits can help reduce 3 risk factors for stroke:  High cholesterol, hypertension, and excess weight.  RESOURCES Stroke/Support Group:  Call [phone]  they meet the 3rd Sunday of the month on the Rehab Unit at [facility], [place] ( no meetings June, July & Aug).  STROKE EDUCATION PROVIDED/REVIEWED AND GIVEN TO PATIENT Stroke warning signs and symptoms How to activate emergency medical system (call 911). Medications prescribed at discharge. Need for follow-up after discharge. Personal risk factors for stroke. Pneumonia vaccine given:   {STROKE DC YES/NO/DATE:22363} Flu vaccine given:   {STROKE DC YES/NO/DATE:22363} My questions have been answered, the writing  is legible, and I understand these instructions.  I will adhere to these goals & educational materials that have been provided to me after my discharge from the hospital.

## 2011-08-27 ENCOUNTER — Inpatient Hospital Stay (HOSPITAL_COMMUNITY): Payer: Medicare Other

## 2011-08-27 DIAGNOSIS — I2510 Atherosclerotic heart disease of native coronary artery without angina pectoris: Secondary | ICD-10-CM | POA: Diagnosis not present

## 2011-08-27 DIAGNOSIS — G8190 Hemiplegia, unspecified affecting unspecified side: Secondary | ICD-10-CM

## 2011-08-27 DIAGNOSIS — G459 Transient cerebral ischemic attack, unspecified: Secondary | ICD-10-CM | POA: Diagnosis not present

## 2011-08-27 DIAGNOSIS — I10 Essential (primary) hypertension: Secondary | ICD-10-CM | POA: Diagnosis not present

## 2011-08-27 LAB — COMPREHENSIVE METABOLIC PANEL
Albumin: 2.9 g/dL — ABNORMAL LOW (ref 3.5–5.2)
Alkaline Phosphatase: 67 U/L (ref 39–117)
BUN: 9 mg/dL (ref 6–23)
Potassium: 4.5 mEq/L (ref 3.5–5.1)
Total Protein: 6 g/dL (ref 6.0–8.3)

## 2011-08-27 LAB — CBC
HCT: 36.5 % (ref 36.0–46.0)
MCHC: 32.3 g/dL (ref 30.0–36.0)
Platelets: 125 10*3/uL — ABNORMAL LOW (ref 150–400)
RDW: 12.8 % (ref 11.5–15.5)

## 2011-08-27 NOTE — Progress Notes (Signed)
Subjective: Feels better this AM, although still has some pain.  Objective: Vital signs in last 24 hours: Temp:  [97.8 F (36.6 C)-98.4 F (36.9 C)] 98.4 F (36.9 C) (05/14 1000) Pulse Rate:  [63-73] 72  (05/14 1000) Resp:  [17-18] 18  (05/14 1000) BP: (128-155)/(69-79) 128/76 mmHg (05/14 1000) SpO2:  [95 %-98 %] 98 % (05/14 1000) Weight change:  Last BM Date: 08/25/11  Intake/Output from previous day:       Physical Exam: General: Comfortable, alert, communicative, fully oriented, not short of breath at rest.  HEENT:  No clinical pallor, no jaundice, no conjunctival injection or discharge. Has obvious forehead bruising, and nasal/facial excoriations. Hydration status seems fair. NECK:  Supple, JVP not seen, no carotid bruits, no palpable lymphadenopathy, no palpable goiter. CHEST:  Clinically clear to auscultation, no wheezes, no crackles. HEART:  Sounds 1 and 2 heard, normal, regular, no murmurs. ABDOMEN:  Full, soft, non-tender, no palpable organomegaly, no palpable masses, normal bowel sounds. GENITALIA:  Not examined. LOWER EXTREMITIES:  No pitting edema, palpable peripheral pulses. Has abrasion of left knee. MUSCULOSKELETAL SYSTEM:  Generalized osteoarthritic changes, otherwise, normal. CENTRAL NERVOUS SYSTEM:  Patient has weakness of hand grip bilaterally, but obviously worse on left. Planter response is slightly up-going on left, otherwise, no focal neurologic deficit on gross examination.  Lab Results:  Basename 08/27/11 0555 08/26/11 0625  WBC 6.2 7.7  HGB 11.8* 11.6*  HCT 36.5 35.2*  PLT 125* 137*    Basename 08/27/11 0555 08/26/11 0625  NA 142 139  K 4.5 3.8  CL 111 107  CO2 23 26  GLUCOSE 114* 101*  BUN 9 13  CREATININE 0.76 0.79  CALCIUM 10.4 10.4   No results found for this or any previous visit (from the past 240 hour(s)).   Studies/Results: Ct Head Wo Contrast  08/25/2011  *RADIOLOGY REPORT*  Clinical Data:  Code stroke.  Fall.  Left-sided  weakness.  Pain.  CT HEAD WITHOUT CONTRAST CT CERVICAL SPINE WITHOUT CONTRAST  Technique:  Multidetector CT imaging of the head and cervical spine was performed following the standard protocol without intravenous contrast.  Multiplanar CT image reconstructions of the cervical spine were also generated.  Comparison:  Report from 03/08/2002  CT HEAD  Findings: The brain stem, cerebellum, cerebral peduncles, thalami, basal ganglia, basilar cisterns, and ventricular system appear unremarkable.  No intracranial hemorrhage, mass lesion, or acute infarction is identified.  Mild scalp hematoma along the center of the forehead noted.  There is mild chronic maxillary and ethmoid sinusitis.  IMPRESSION:  1.  No acute intracranial findings. 2.  Mild scalp hematoma along the center of the forehead. 3.  Mild chronic maxillary and ethmoid sinusitis.  CT CERVICAL SPINE  Findings: Prominent cervical spondylosis and degenerative disc disease noted with multilevel calcified intervertebral discs, prominent loss of disc space at C5-6 with endplate sclerosis, posterior osseous ridging at all levels in the cervical spine, and prominent anterior intervertebral spurring at C5-6, C6-7, and C7- T1.  Facet arthropathy is most prominent on the left at C3-4, C4-5, and C5-6, and on the right that CT three, C3-4, and C4-5.  Mildly exaggerated upper cervical lordosis noted.  There is likely some central stenosis at C2-3 and C5-6 weighted to disc osteophyte complexes.  Erosive arthropathy of the medial clavicle noted.  No cervical spine fracture or acute subluxation is observed.  IMPRESSION:  1.  Advance cervical spondylosis without fracture or acute subluxation observed.  Critical Value/emergent results were called by telephone to  Dr. Delora Fuel (who verbally acknowledged these results) at the time of interpretation on 08/25/2011 at 10:25 p.m.  Original Report Authenticated By: Carron Curie, M.D.   Ct Cervical Spine Wo  Contrast  08/25/2011  *RADIOLOGY REPORT*  Clinical Data:  Code stroke.  Fall.  Left-sided weakness.  Pain.  CT HEAD WITHOUT CONTRAST CT CERVICAL SPINE WITHOUT CONTRAST  Technique:  Multidetector CT imaging of the head and cervical spine was performed following the standard protocol without intravenous contrast.  Multiplanar CT image reconstructions of the cervical spine were also generated.  Comparison:  Report from 03/08/2002  CT HEAD  Findings: The brain stem, cerebellum, cerebral peduncles, thalami, basal ganglia, basilar cisterns, and ventricular system appear unremarkable.  No intracranial hemorrhage, mass lesion, or acute infarction is identified.  Mild scalp hematoma along the center of the forehead noted.  There is mild chronic maxillary and ethmoid sinusitis.  IMPRESSION:  1.  No acute intracranial findings. 2.  Mild scalp hematoma along the center of the forehead. 3.  Mild chronic maxillary and ethmoid sinusitis.  CT CERVICAL SPINE  Findings: Prominent cervical spondylosis and degenerative disc disease noted with multilevel calcified intervertebral discs, prominent loss of disc space at C5-6 with endplate sclerosis, posterior osseous ridging at all levels in the cervical spine, and prominent anterior intervertebral spurring at C5-6, C6-7, and C7- T1.  Facet arthropathy is most prominent on the left at C3-4, C4-5, and C5-6, and on the right that CT three, C3-4, and C4-5.  Mildly exaggerated upper cervical lordosis noted.  There is likely some central stenosis at C2-3 and C5-6 weighted to disc osteophyte complexes.  Erosive arthropathy of the medial clavicle noted.  No cervical spine fracture or acute subluxation is observed.  IMPRESSION:  1.  Advance cervical spondylosis without fracture or acute subluxation observed.  Critical Value/emergent results were called by telephone to Dr. Delora Fuel (who verbally acknowledged these results) at the time of interpretation on 08/25/2011 at 10:25 p.m.  Original  Report Authenticated By: Carron Curie, M.D.   Mri Brain Without Contrast  08/27/2011  *RADIOLOGY REPORT*  Clinical Data:  78 year old female.  History of seizures. Transient ischemic attack.  Comparison: Head CT 08/25/2011.  MRI HEAD WITHOUT CONTRAST  Technique: Multiplanar, multiecho pulse sequences of the brain and surrounding structures were obtained according to standard protocol without intravenous contrast.  Findings: No restricted diffusion to suggest acute infarction.  No midline shift, mass effect, evidence of mass lesion, ventriculomegaly, extra-axial collection or acute intracranial hemorrhage.  Cervicomedullary junction and pituitary are within normal limits.  Major intracranial vascular flow voids are preserved. Pearline Cables and white matter signal is within normal limits for age throughout the brain.  Multilevel degenerative changes in the cervical spine, advanced at C5-C6.  A degree of degenerative cervical spinal stenosis is suspected.  Outside of the degenerative marrow changes, bone marrow signal is within normal limits. Prominent but benign appearing vascular structure traverses the clivus.  Postoperative changes to the globes. Visualized paranasal sinuses and mastoids are clear. Negative scalp soft tissues.  IMPRESSION: 1.  Normal for age MRI appearance of the brain. 2.  Advanced cervical spine degenerative changes. 3.  MRA findings are below.  MRA HEAD WITHOUT CONTRAST  Technique: Angiographic images of the Circle of Willis were obtained using MRA technique without  intravenous contrast.  Findings: Antegrade flow in the posterior circulation.  Mildly dominant distal right vertebral artery.  Normal left PICA. Tortuous vertebrobasilar junction.  No basilar artery stenosis. Normal AICA vessels. Normal  superior cerebellar artery and PCA origins.  Normal posterior communicating arteries.  Bilateral PCA branches are within normal limits.  Antegrade flow in both ICA siphons.  No ICA stenosis.   Ophthalmic and posterior communicating artery origins are within normal limits.  Normal carotid termini, MCA and ACA origins.  Diminutive or absent anterior communicating artery.  Visualized ACA branches are within normal limits.  Visualized bilateral MCA branches are within normal limits.  IMPRESSION:  Negative intracranial MRA.  Original Report Authenticated By: Randall An, M.D.   Dg Hand 2 View Right  08/27/2011  *RADIOLOGY REPORT*  Clinical Data: Pain and swelling after fall.  RIGHT HAND - 2 VIEW  Comparison: None.  Findings: Degenerative changes involving the radial carpal, STT, first carpometacarpal, first metacarpal phalangeal, and multiple interphalangeal joints.  Subcortical cysts are present.  No definite erosive changes.  No evidence of acute fracture or subluxation.  IMPRESSION: Degenerative changes throughout the right hand and wrist.  No acute fractures demonstrated.  Original Report Authenticated By: Neale Burly, M.D.   Dg Hand 2 View Left  08/27/2011  *RADIOLOGY REPORT*  Clinical Data: Pain and swelling in both hands.  Fall.  LEFT HAND - 2 VIEW  Comparison: None.  Findings: Degenerative changes involving the radial carpal, STT, first carpometacarpal, first metacarpal phalangeal, second metacarpal phalangeal, and multiple interphalangeal joints. Subcortical cystic changes in the proximal phalanx of the second finger.  No definite evidence of bone erosion.  No acute fractures or subluxations are appreciated.  IMPRESSION: Degenerative changes throughout the left hand and wrist.  No acute fractures demonstrated.  Original Report Authenticated By: Neale Burly, M.D.   Mr Mra Head/brain Wo Cm  08/27/2011  *RADIOLOGY REPORT*  Clinical Data:  78 year old female.  History of seizures. Transient ischemic attack.  Comparison: Head CT 08/25/2011.  MRI HEAD WITHOUT CONTRAST  Technique: Multiplanar, multiecho pulse sequences of the brain and surrounding structures were obtained according  to standard protocol without intravenous contrast.  Findings: No restricted diffusion to suggest acute infarction.  No midline shift, mass effect, evidence of mass lesion, ventriculomegaly, extra-axial collection or acute intracranial hemorrhage.  Cervicomedullary junction and pituitary are within normal limits.  Major intracranial vascular flow voids are preserved. Pearline Cables and white matter signal is within normal limits for age throughout the brain.  Multilevel degenerative changes in the cervical spine, advanced at C5-C6.  A degree of degenerative cervical spinal stenosis is suspected.  Outside of the degenerative marrow changes, bone marrow signal is within normal limits. Prominent but benign appearing vascular structure traverses the clivus.  Postoperative changes to the globes. Visualized paranasal sinuses and mastoids are clear. Negative scalp soft tissues.  IMPRESSION: 1.  Normal for age MRI appearance of the brain. 2.  Advanced cervical spine degenerative changes. 3.  MRA findings are below.  MRA HEAD WITHOUT CONTRAST  Technique: Angiographic images of the Circle of Willis were obtained using MRA technique without  intravenous contrast.  Findings: Antegrade flow in the posterior circulation.  Mildly dominant distal right vertebral artery.  Normal left PICA. Tortuous vertebrobasilar junction.  No basilar artery stenosis. Normal AICA vessels. Normal superior cerebellar artery and PCA origins.  Normal posterior communicating arteries.  Bilateral PCA branches are within normal limits.  Antegrade flow in both ICA siphons.  No ICA stenosis.  Ophthalmic and posterior communicating artery origins are within normal limits.  Normal carotid termini, MCA and ACA origins.  Diminutive or absent anterior communicating artery.  Visualized ACA branches are within normal limits.  Visualized bilateral MCA branches are within normal limits.  IMPRESSION:  Negative intracranial MRA.  Original Report Authenticated By: Randall An,  M.D.    Medications: Scheduled Meds:    . aspirin  325 mg Oral Daily  . bimatoprost  1 drop Both Eyes QHS  . brimonidine  1 drop Right Eye BID  . clopidogrel  75 mg Oral Daily  . enoxaparin  30 mg Subcutaneous Q24H  . HYDROcodone-acetaminophen  1 tablet Oral TID  . metoprolol succinate  50 mg Oral Daily  . ramipril  10 mg Oral Daily  . simvastatin  20 mg Oral q1800   Continuous Infusions:    . sodium chloride 75 mL/hr at 08/26/11 0409   PRN Meds:.nitroGLYCERIN, oxyCODONE  Assessment/Plan:  1. TIA: Patient's left upper extremity weakness, appears at base line, at this point. Brain MRI shows normal for age appearance, Brain MRA is negative, carotid Doppler showed no significant extracranial carotid artery stenosis, and veterbrals are patent with antegrade flow. 2-D echocardiogram revealed normal left ventricular cavity size, mild focal basal hypertrophy of the septum, ejection fraction of 55% to 60% and no regional wall motion abnormalities. Neurology consultation was provided by Dr Alexis Goodell, and DR Antony Contras, saw patient on 08/27/11. Patient appears to have had aTIA. She continues on  Aspirin/Plavix and PT/OT. Lipid profile: TC 171, TG 62, HDL 70 and LDL89. We shall continue statin treatment in current dose, although there is room for improvement. 2. Fall: This is secondary to #1, and is complicated by scalp hematoma, facial excoriations an d UE/LE bruising and left knee abrasion. Fortunately, there appear to be no bony injuries. We are managing with scheduled analgesics, with satisfactory results.   3. Cervical spondylosis. Patient has bilateral hand weakness, albeit Lt>Rt. Cervical spine CT, reveals advanced cervical spondylosis. Likely, patient has some cervical spondylotic myelopathy. Cervical spine MRI has been ordered, and neurosurgical consultation, requested. 4. CAD, status post stenting: Patient appears stable from this viewpoint. She has no chest pain or dyspnea.  Continue present medications.  5. Peripheral vascular disease status post left-sided femoro-popliteal bypass: Stable.  6. Hyperparathyroidism with hypercalcemia: This was diagnosed in 2003. Defer to primary MD. 7. Hypertension: BP is sub-optimally controlled. We shall continue pre-admission antihypertensives, for now, and adjust as indicated.      LOS: 2 days   Uel Davidow,CHRISTOPHER 08/27/2011, 1:42 PM

## 2011-08-27 NOTE — Consult Note (Signed)
Reason for Consult: History of fall, cervical spondylosis Referring Physician: Dr. Gareth Morgan is an 78 y.o. female.  HPI: Patient is an 78 year old female who sustained a fall a few days ago. She was noted to be described as feeling diffusely weak however in talking to the patient this evening she notes that she has no focal weakness denies any history of weakness and currently denies any difficulty with her upper extremities or her lower extremities in terms of weakness pain or discomfort. She tells me that her hands have always been difficult to move because of profound arthritis and on initial inspection she clearly has advanced arthritic changes in the hands and wrists bilaterally. A CT scan of the cervical spine was performed which demonstrates advanced spondylosis at multiple levels with a hyperlordotic midportion of the cervical spine. The patient has not had an MRI at this time. Concern is that the patient may be myelopathic. In any event in my initial discussions with the patient she clearly indicated to me that she has no interest in pursuing any surgery.  Past Medical History  Diagnosis Date  . CAD (coronary artery disease) 2003, 2010    s/p stenting of RCA x2, Last cardiac cath in 2011 with 90% occlusion in obtuse marginal but too unstable to stent, with no stenting, last Echo 2010 showing normal EF 60-65% with grade 2 diastolic dysfunction   . Hyperlipidemia   . HTN (hypertension)   . PVD (peripheral vascular disease)     s/p left fem-pop bypass graft  . Hyperparathyroidism 2003    parathyroid adenoma localized to the right inferior thyroid lobe region  . Renal artery stenosis     left, 40%  . Osteoporosis 2009    diagnosed by bone density scan in 2009, on Vit D only, no bisphosphonate therapy  . Bacteremia 2003    hx of staph bacteremia  . Degenerative disc disease   . Lumbar stenosis with neurogenic claudication 2006    s/p decompression L2-L5  . Abscess 2003    I&D  SCM abscess  . Arthritis   . History of medication noncompliance     concerning Plavix  . Depression   . Phlebitis     LLE  . Angina   . Myocardial infarction 2010; 2011  . Shortness of breath on exertion   . Hypothyroidism   . Acute ischemic colitis 04/10/11    acute episode of ischemic colitis, with abd pain, CT evidence of transverse colitis, and blood streaked BM.  Resolved with conservative treatment.    Past Surgical History  Procedure Date  . Appendectomy   . Femoral bypass before 2003    LLE  . Incision and drainage anterior neck 10/2001    absces sternoclavicular joint  . Coronary angioplasty with stent placement 09/2008  . Coronary angioplasty with stent placement 04/2001  . Back surgery 06/2004    L3, L4 lami; L2, L5 hemilami; decompresion L2-3, 3-4, 4-5    Family History  Problem Relation Age of Onset  . Stroke Mother   . Stroke Father     Social History:  reports that she has never smoked. She has never used smokeless tobacco. She reports that she does not drink alcohol or use illicit drugs.  Allergies:  Allergies  Allergen Reactions  . Diphenhydramine Hcl Other (See Comments)    unknown  . Penicillins Other (See Comments)    unknown  . Shellfish Allergy Other (See Comments)    unknown  Medications: I have reviewed the patient's current medications.  Results for orders placed during the hospital encounter of 08/25/11 (from the past 48 hour(s))  PROTIME-INR     Status: Normal   Collection Time   08/25/11  9:52 PM      Component Value Range Comment   Prothrombin Time 13.0  11.6 - 15.2 (seconds)    INR 0.96  0.00 - 1.49    APTT     Status: Normal   Collection Time   08/25/11  9:52 PM      Component Value Range Comment   aPTT 27  24 - 37 (seconds)   CBC     Status: Abnormal   Collection Time   08/25/11  9:52 PM      Component Value Range Comment   WBC 6.0  4.0 - 10.5 (K/uL)    RBC 3.80 (*) 3.87 - 5.11 (MIL/uL)    Hemoglobin 11.9 (*) 12.0 - 15.0  (g/dL)    HCT 36.5  36.0 - 46.0 (%)    MCV 96.1  78.0 - 100.0 (fL)    MCH 31.3  26.0 - 34.0 (pg)    MCHC 32.6  30.0 - 36.0 (g/dL)    RDW 12.7  11.5 - 15.5 (%)    Platelets 143 (*) 150 - 400 (K/uL)   DIFFERENTIAL     Status: Abnormal   Collection Time   08/25/11  9:52 PM      Component Value Range Comment   Neutrophils Relative 40 (*) 43 - 77 (%)    Neutro Abs 2.4  1.7 - 7.7 (K/uL)    Lymphocytes Relative 45  12 - 46 (%)    Lymphs Abs 2.7  0.7 - 4.0 (K/uL)    Monocytes Relative 11  3 - 12 (%)    Monocytes Absolute 0.7  0.1 - 1.0 (K/uL)    Eosinophils Relative 3  0 - 5 (%)    Eosinophils Absolute 0.2  0.0 - 0.7 (K/uL)    Basophils Relative 1  0 - 1 (%)    Basophils Absolute 0.0  0.0 - 0.1 (K/uL)   COMPREHENSIVE METABOLIC PANEL     Status: Abnormal   Collection Time   08/25/11  9:52 PM      Component Value Range Comment   Sodium 143  135 - 145 (mEq/L)    Potassium 4.2  3.5 - 5.1 (mEq/L)    Chloride 109  96 - 112 (mEq/L)    CO2 26  19 - 32 (mEq/L)    Glucose, Bld 109 (*) 70 - 99 (mg/dL)    BUN 17  6 - 23 (mg/dL)    Creatinine, Ser 0.94  0.50 - 1.10 (mg/dL)    Calcium 11.1 (*) 8.4 - 10.5 (mg/dL)    Total Protein 7.0  6.0 - 8.3 (g/dL)    Albumin 3.7  3.5 - 5.2 (g/dL)    AST 27  0 - 37 (U/L) HEMOLYSIS AT THIS LEVEL MAY AFFECT RESULT   ALT 16  0 - 35 (U/L)    Alkaline Phosphatase 94  39 - 117 (U/L)    Total Bilirubin 0.2 (*) 0.3 - 1.2 (mg/dL)    GFR calc non Af Amer 55 (*) >90 (mL/min)    GFR calc Af Amer 63 (*) >90 (mL/min)   CK TOTAL AND CKMB     Status: Abnormal   Collection Time   08/25/11  9:52 PM      Component Value Range Comment   Total  CK 293 (*) 7 - 177 (U/L)    CK, MB 8.2 (*) 0.3 - 4.0 (ng/mL)    Relative Index 2.8 (*) 0.0 - 2.5    TROPONIN I     Status: Normal   Collection Time   08/25/11  9:52 PM      Component Value Range Comment   Troponin I <0.30  <0.30 (ng/mL)   GLUCOSE, CAPILLARY     Status: Abnormal   Collection Time   08/25/11 10:34 PM      Component  Value Range Comment   Glucose-Capillary 100 (*) 70 - 99 (mg/dL)   POCT I-STAT, CHEM 8     Status: Abnormal   Collection Time   08/25/11 10:39 PM      Component Value Range Comment   Sodium 143  135 - 145 (mEq/L)    Potassium 4.2  3.5 - 5.1 (mEq/L)    Chloride 110  96 - 112 (mEq/L)    BUN 17  6 - 23 (mg/dL)    Creatinine, Ser 0.90  0.50 - 1.10 (mg/dL)    Glucose, Bld 114 (*) 70 - 99 (mg/dL)    Calcium, Ion 1.49 (*) 1.12 - 1.32 (mmol/L)    TCO2 26  0 - 100 (mmol/L)    Hemoglobin 12.6  12.0 - 15.0 (g/dL)    HCT 37.0  36.0 - 46.0 (%)   URINE RAPID DRUG SCREEN (HOSP PERFORMED)     Status: Normal   Collection Time   08/26/11 12:04 AM      Component Value Range Comment   Opiates NONE DETECTED  NONE DETECTED     Cocaine NONE DETECTED  NONE DETECTED     Benzodiazepines NONE DETECTED  NONE DETECTED     Amphetamines NONE DETECTED  NONE DETECTED     Tetrahydrocannabinol NONE DETECTED  NONE DETECTED     Barbiturates NONE DETECTED  NONE DETECTED    COMPREHENSIVE METABOLIC PANEL     Status: Abnormal   Collection Time   08/26/11  6:25 AM      Component Value Range Comment   Sodium 139  135 - 145 (mEq/L)    Potassium 3.8  3.5 - 5.1 (mEq/L)    Chloride 107  96 - 112 (mEq/L)    CO2 26  19 - 32 (mEq/L)    Glucose, Bld 101 (*) 70 - 99 (mg/dL)    BUN 13  6 - 23 (mg/dL)    Creatinine, Ser 0.79  0.50 - 1.10 (mg/dL)    Calcium 10.4  8.4 - 10.5 (mg/dL)    Total Protein 6.4  6.0 - 8.3 (g/dL)    Albumin 3.2 (*) 3.5 - 5.2 (g/dL)    AST 23  0 - 37 (U/L)    ALT 15  0 - 35 (U/L)    Alkaline Phosphatase 78  39 - 117 (U/L)    Total Bilirubin 0.2 (*) 0.3 - 1.2 (mg/dL)    GFR calc non Af Amer 75 (*) >90 (mL/min)    GFR calc Af Amer 87 (*) >90 (mL/min)   CBC     Status: Abnormal   Collection Time   08/26/11  6:25 AM      Component Value Range Comment   WBC 7.7  4.0 - 10.5 (K/uL)    RBC 3.67 (*) 3.87 - 5.11 (MIL/uL)    Hemoglobin 11.6 (*) 12.0 - 15.0 (g/dL)    HCT 35.2 (*) 36.0 - 46.0 (%)    MCV 95.9   78.0 -  100.0 (fL)    MCH 31.6  26.0 - 34.0 (pg)    MCHC 33.0  30.0 - 36.0 (g/dL)    RDW 12.7  11.5 - 15.5 (%)    Platelets 137 (*) 150 - 400 (K/uL)   TSH     Status: Abnormal   Collection Time   08/26/11  6:25 AM      Component Value Range Comment   TSH 0.245 (*) 0.350 - 4.500 (uIU/mL)   HEMOGLOBIN A1C     Status: Abnormal   Collection Time   08/26/11  6:25 AM      Component Value Range Comment   Hemoglobin A1C 5.9 (*) <5.7 (%)    Mean Plasma Glucose 123 (*) <117 (mg/dL)   LIPID PANEL     Status: Normal   Collection Time   08/26/11  6:25 AM      Component Value Range Comment   Cholesterol 171  0 - 200 (mg/dL)    Triglycerides 62  <150 (mg/dL)    HDL 70  >39 (mg/dL)    Total CHOL/HDL Ratio 2.4      VLDL 12  0 - 40 (mg/dL)    LDL Cholesterol 89  0 - 99 (mg/dL)   URIC ACID     Status: Normal   Collection Time   08/26/11  6:25 AM      Component Value Range Comment   Uric Acid, Serum 6.1  2.4 - 7.0 (mg/dL)   GLUCOSE, CAPILLARY     Status: Normal   Collection Time   08/26/11  7:02 AM      Component Value Range Comment   Glucose-Capillary 99  70 - 99 (mg/dL)   GLUCOSE, CAPILLARY     Status: Abnormal   Collection Time   08/26/11 11:31 AM      Component Value Range Comment   Glucose-Capillary 105 (*) 70 - 99 (mg/dL)   CBC     Status: Abnormal   Collection Time   08/27/11  5:55 AM      Component Value Range Comment   WBC 6.2  4.0 - 10.5 (K/uL)    RBC 3.80 (*) 3.87 - 5.11 (MIL/uL)    Hemoglobin 11.8 (*) 12.0 - 15.0 (g/dL)    HCT 36.5  36.0 - 46.0 (%)    MCV 96.1  78.0 - 100.0 (fL)    MCH 31.1  26.0 - 34.0 (pg)    MCHC 32.3  30.0 - 36.0 (g/dL)    RDW 12.8  11.5 - 15.5 (%)    Platelets 125 (*) 150 - 400 (K/uL)   COMPREHENSIVE METABOLIC PANEL     Status: Abnormal   Collection Time   08/27/11  5:55 AM      Component Value Range Comment   Sodium 142  135 - 145 (mEq/L)    Potassium 4.5  3.5 - 5.1 (mEq/L)    Chloride 111  96 - 112 (mEq/L)    CO2 23  19 - 32 (mEq/L)    Glucose, Bld  114 (*) 70 - 99 (mg/dL)    BUN 9  6 - 23 (mg/dL)    Creatinine, Ser 0.76  0.50 - 1.10 (mg/dL)    Calcium 10.4  8.4 - 10.5 (mg/dL)    Total Protein 6.0  6.0 - 8.3 (g/dL)    Albumin 2.9 (*) 3.5 - 5.2 (g/dL)    AST 24  0 - 37 (U/L) HEMOLYSIS AT THIS LEVEL MAY AFFECT RESULT   ALT 15  0 - 35 (  U/L)    Alkaline Phosphatase 67  39 - 117 (U/L)    Total Bilirubin 0.2 (*) 0.3 - 1.2 (mg/dL)    GFR calc non Af Amer 76 (*) >90 (mL/min)    GFR calc Af Amer 88 (*) >90 (mL/min)     Ct Head Wo Contrast  08/25/2011  *RADIOLOGY REPORT*  Clinical Data:  Code stroke.  Fall.  Left-sided weakness.  Pain.  CT HEAD WITHOUT CONTRAST CT CERVICAL SPINE WITHOUT CONTRAST  Technique:  Multidetector CT imaging of the head and cervical spine was performed following the standard protocol without intravenous contrast.  Multiplanar CT image reconstructions of the cervical spine were also generated.  Comparison:  Report from 03/08/2002  CT HEAD  Findings: The brain stem, cerebellum, cerebral peduncles, thalami, basal ganglia, basilar cisterns, and ventricular system appear unremarkable.  No intracranial hemorrhage, mass lesion, or acute infarction is identified.  Mild scalp hematoma along the center of the forehead noted.  There is mild chronic maxillary and ethmoid sinusitis.  IMPRESSION:  1.  No acute intracranial findings. 2.  Mild scalp hematoma along the center of the forehead. 3.  Mild chronic maxillary and ethmoid sinusitis.  CT CERVICAL SPINE  Findings: Prominent cervical spondylosis and degenerative disc disease noted with multilevel calcified intervertebral discs, prominent loss of disc space at C5-6 with endplate sclerosis, posterior osseous ridging at all levels in the cervical spine, and prominent anterior intervertebral spurring at C5-6, C6-7, and C7- T1.  Facet arthropathy is most prominent on the left at C3-4, C4-5, and C5-6, and on the right that CT three, C3-4, and C4-5.  Mildly exaggerated upper cervical lordosis noted.   There is likely some central stenosis at C2-3 and C5-6 weighted to disc osteophyte complexes.  Erosive arthropathy of the medial clavicle noted.  No cervical spine fracture or acute subluxation is observed.  IMPRESSION:  1.  Advance cervical spondylosis without fracture or acute subluxation observed.  Critical Value/emergent results were called by telephone to Dr. Delora Fuel (who verbally acknowledged these results) at the time of interpretation on 08/25/2011 at 10:25 p.m.  Original Report Authenticated By: Carron Curie, M.D.   Ct Cervical Spine Wo Contrast  08/25/2011  *RADIOLOGY REPORT*  Clinical Data:  Code stroke.  Fall.  Left-sided weakness.  Pain.  CT HEAD WITHOUT CONTRAST CT CERVICAL SPINE WITHOUT CONTRAST  Technique:  Multidetector CT imaging of the head and cervical spine was performed following the standard protocol without intravenous contrast.  Multiplanar CT image reconstructions of the cervical spine were also generated.  Comparison:  Report from 03/08/2002  CT HEAD  Findings: The brain stem, cerebellum, cerebral peduncles, thalami, basal ganglia, basilar cisterns, and ventricular system appear unremarkable.  No intracranial hemorrhage, mass lesion, or acute infarction is identified.  Mild scalp hematoma along the center of the forehead noted.  There is mild chronic maxillary and ethmoid sinusitis.  IMPRESSION:  1.  No acute intracranial findings. 2.  Mild scalp hematoma along the center of the forehead. 3.  Mild chronic maxillary and ethmoid sinusitis.  CT CERVICAL SPINE  Findings: Prominent cervical spondylosis and degenerative disc disease noted with multilevel calcified intervertebral discs, prominent loss of disc space at C5-6 with endplate sclerosis, posterior osseous ridging at all levels in the cervical spine, and prominent anterior intervertebral spurring at C5-6, C6-7, and C7- T1.  Facet arthropathy is most prominent on the left at C3-4, C4-5, and C5-6, and on the right that CT  three, C3-4, and C4-5.  Mildly exaggerated upper  cervical lordosis noted.  There is likely some central stenosis at C2-3 and C5-6 weighted to disc osteophyte complexes.  Erosive arthropathy of the medial clavicle noted.  No cervical spine fracture or acute subluxation is observed.  IMPRESSION:  1.  Advance cervical spondylosis without fracture or acute subluxation observed.  Critical Value/emergent results were called by telephone to Dr. Delora Fuel (who verbally acknowledged these results) at the time of interpretation on 08/25/2011 at 10:25 p.m.  Original Report Authenticated By: Carron Curie, M.D.   Mri Brain Without Contrast  08/27/2011  *RADIOLOGY REPORT*  Clinical Data:  78 year old female.  History of seizures. Transient ischemic attack.  Comparison: Head CT 08/25/2011.  MRI HEAD WITHOUT CONTRAST  Technique: Multiplanar, multiecho pulse sequences of the brain and surrounding structures were obtained according to standard protocol without intravenous contrast.  Findings: No restricted diffusion to suggest acute infarction.  No midline shift, mass effect, evidence of mass lesion, ventriculomegaly, extra-axial collection or acute intracranial hemorrhage.  Cervicomedullary junction and pituitary are within normal limits.  Major intracranial vascular flow voids are preserved. Pearline Cables and white matter signal is within normal limits for age throughout the brain.  Multilevel degenerative changes in the cervical spine, advanced at C5-C6.  A degree of degenerative cervical spinal stenosis is suspected.  Outside of the degenerative marrow changes, bone marrow signal is within normal limits. Prominent but benign appearing vascular structure traverses the clivus.  Postoperative changes to the globes. Visualized paranasal sinuses and mastoids are clear. Negative scalp soft tissues.  IMPRESSION: 1.  Normal for age MRI appearance of the brain. 2.  Advanced cervical spine degenerative changes. 3.  MRA findings are  below.  MRA HEAD WITHOUT CONTRAST  Technique: Angiographic images of the Circle of Willis were obtained using MRA technique without  intravenous contrast.  Findings: Antegrade flow in the posterior circulation.  Mildly dominant distal right vertebral artery.  Normal left PICA. Tortuous vertebrobasilar junction.  No basilar artery stenosis. Normal AICA vessels. Normal superior cerebellar artery and PCA origins.  Normal posterior communicating arteries.  Bilateral PCA branches are within normal limits.  Antegrade flow in both ICA siphons.  No ICA stenosis.  Ophthalmic and posterior communicating artery origins are within normal limits.  Normal carotid termini, MCA and ACA origins.  Diminutive or absent anterior communicating artery.  Visualized ACA branches are within normal limits.  Visualized bilateral MCA branches are within normal limits.  IMPRESSION:  Negative intracranial MRA.  Original Report Authenticated By: Randall An, M.D.   Dg Hand 2 View Right  08/27/2011  *RADIOLOGY REPORT*  Clinical Data: Pain and swelling after fall.  RIGHT HAND - 2 VIEW  Comparison: None.  Findings: Degenerative changes involving the radial carpal, STT, first carpometacarpal, first metacarpal phalangeal, and multiple interphalangeal joints.  Subcortical cysts are present.  No definite erosive changes.  No evidence of acute fracture or subluxation.  IMPRESSION: Degenerative changes throughout the right hand and wrist.  No acute fractures demonstrated.  Original Report Authenticated By: Neale Burly, M.D.   Dg Hand 2 View Left  08/27/2011  *RADIOLOGY REPORT*  Clinical Data: Pain and swelling in both hands.  Fall.  LEFT HAND - 2 VIEW  Comparison: None.  Findings: Degenerative changes involving the radial carpal, STT, first carpometacarpal, first metacarpal phalangeal, second metacarpal phalangeal, and multiple interphalangeal joints. Subcortical cystic changes in the proximal phalanx of the second finger.  No definite  evidence of bone erosion.  No acute fractures or subluxations are appreciated.  IMPRESSION: Degenerative  changes throughout the left hand and wrist.  No acute fractures demonstrated.  Original Report Authenticated By: Neale Burly, M.D.   Mr Mra Head/brain Wo Cm  08/27/2011  *RADIOLOGY REPORT*  Clinical Data:  78 year old female.  History of seizures. Transient ischemic attack.  Comparison: Head CT 08/25/2011.  MRI HEAD WITHOUT CONTRAST  Technique: Multiplanar, multiecho pulse sequences of the brain and surrounding structures were obtained according to standard protocol without intravenous contrast.  Findings: No restricted diffusion to suggest acute infarction.  No midline shift, mass effect, evidence of mass lesion, ventriculomegaly, extra-axial collection or acute intracranial hemorrhage.  Cervicomedullary junction and pituitary are within normal limits.  Major intracranial vascular flow voids are preserved. Pearline Cables and white matter signal is within normal limits for age throughout the brain.  Multilevel degenerative changes in the cervical spine, advanced at C5-C6.  A degree of degenerative cervical spinal stenosis is suspected.  Outside of the degenerative marrow changes, bone marrow signal is within normal limits. Prominent but benign appearing vascular structure traverses the clivus.  Postoperative changes to the globes. Visualized paranasal sinuses and mastoids are clear. Negative scalp soft tissues.  IMPRESSION: 1.  Normal for age MRI appearance of the brain. 2.  Advanced cervical spine degenerative changes. 3.  MRA findings are below.  MRA HEAD WITHOUT CONTRAST  Technique: Angiographic images of the Circle of Willis were obtained using MRA technique without  intravenous contrast.  Findings: Antegrade flow in the posterior circulation.  Mildly dominant distal right vertebral artery.  Normal left PICA. Tortuous vertebrobasilar junction.  No basilar artery stenosis. Normal AICA vessels. Normal superior  cerebellar artery and PCA origins.  Normal posterior communicating arteries.  Bilateral PCA branches are within normal limits.  Antegrade flow in both ICA siphons.  No ICA stenosis.  Ophthalmic and posterior communicating artery origins are within normal limits.  Normal carotid termini, MCA and ACA origins.  Diminutive or absent anterior communicating artery.  Visualized ACA branches are within normal limits.  Visualized bilateral MCA branches are within normal limits.  IMPRESSION:  Negative intracranial MRA.  Original Report Authenticated By: Randall An, M.D.    Review of Systems  Constitutional: Negative.   HENT: Negative.   Eyes: Negative.   Respiratory: Negative.   Cardiovascular: Negative.   Gastrointestinal: Negative.   Genitourinary: Negative.   Musculoskeletal: Positive for falls.  Skin: Negative.   Neurological: Negative.   Endo/Heme/Allergies: Negative.   Psychiatric/Behavioral: Negative.    Blood pressure 155/64, pulse 89, temperature 98.1 F (36.7 C), temperature source Oral, resp. rate 18, height 5\' 1"  (1.549 m), weight 52.481 kg (115 lb 11.2 oz), SpO2 98.00%. Physical Exam  Constitutional: She is oriented to person, place, and time. She appears well-developed and well-nourished.  HENT:  Head: Normocephalic and atraumatic.  Neck: Normal range of motion.  Cardiovascular: Normal rate and regular rhythm.   Respiratory: Effort normal and breath sounds normal.  GI: Soft. Bowel sounds are normal.  Musculoskeletal: Normal range of motion.       Heart findings of significant arthritis and hand with limited range of motion and decrease grips. Cervical spine with decreased range of motion also.  Neurological: She is alert and oriented to person, place, and time. She has normal reflexes.  Skin: Skin is warm and dry.  Psychiatric: She has a normal mood and affect. Her behavior is normal. Judgment and thought content normal.    Assessment/Plan: Patient has evidence of some mild  diffuse weakness in the upper extremities which may be chronic  in nature. I can understand the appreciation that this patient may be myelopathic however given her desire not to pursue surgery I would be hard put to convince her that this is necessary area she has been scheduled for an MRI of the cervical spine and I will review this study when it is procured without agree with the patient that cervical spine surgery in a patient of advanced age does have substantial risks and may be of limited benefit despite any myelopathic symptoms she may or may not have. I will follow along with you.  Natania Finigan J 08/27/2011, 6:15 PM

## 2011-08-27 NOTE — Progress Notes (Signed)
Stroke Team Progress Note  HISTORY [name] is an 78 y.o. female who was walking behind her granddaughter 08/25/2011. Without warning fell. No dizziness or lightheadedness. After falling was noted to have left upper and lower extremity weakness. Left upper extremity was numb as well. No facial complaints. Patient was brought in by EMS as a code stroke. Patient was not a TPA candidate secondary to rapid improvement in symptoms. She was admitted for further evaluation and treatment.  SUBJECTIVE 2 female family members at the bedside. Pt continues to report left sided hand weakness which is new since her fall. Neurovascular work up is negative. 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  OBJECTIVE Most recent Vital Signs: Filed Vitals:   08/26/11 1400 08/26/11 1800 08/27/11 0130 08/27/11 0445  BP: 134/76 135/69 155/79 135/78  Pulse: 73 63 68 70  Temp: 97.8 F (36.6 C) 98.3 F (36.8 C) 98 F (36.7 C) 98.1 F (36.7 C)  TempSrc: Oral Oral Oral Oral  Resp: 18 18 18 17   Height:      Weight:      SpO2: 98% 95% 96% 96%   CBG (last 3)  Basename 08/26/11 1131 08/26/11 0702 08/25/11 2234  GLUCAP 105* 99 100*   Intake/Output from previous day:  IV Fluid Intake:     . sodium chloride 75 mL/hr at 08/26/11 0409   MEDICATIONS    . aspirin  325 mg Oral Daily  . bimatoprost  1 drop Both Eyes QHS  . brimonidine  1 drop Right Eye BID  . clopidogrel  75 mg Oral Daily  . enoxaparin  30 mg Subcutaneous Q24H  . HYDROcodone-acetaminophen  1 tablet Oral TID  . metoprolol succinate  50 mg Oral Daily  . ramipril  10 mg Oral Daily  . simvastatin  20 mg Oral q1800  . DISCONTD: aspirin  325 mg Oral Daily  . DISCONTD: traMADol  50-100 mg Oral BID   PRN:  nitroGLYCERIN, oxyCODONE, DISCONTD: HYDROcodone-acetaminophen  Diet:  Cardiac thin liquids Activity:   Bathroom privileges with assistance DVT Prophylaxis:  Lovenox 30 mg sq daily   CLINICALLY SIGNIFICANT STUDIES Basic Metabolic Panel:    Lab [id number] 0555 08/26/11 0625  NA 142 139  K 4.5 3.8  CL 111 107  CO2 23 26  GLUCOSE 114* 101*  BUN 9 13  CREATININE 0.76 0.79  CALCIUM 10.4 10.4  MG -- --  PHOS -- --   Liver Function Tests:  Lab 08/27/11 0555 08/26/11 0625  AST 24 23  ALT 15 15  ALKPHOS 67 78  BILITOT 0.2* 0.2*  PROT 6.0 6.4  ALBUMIN 2.9* 3.2*   CBC:  Lab 08/27/11 0555 08/26/11 0625 08/25/11 2152  WBC 6.2 7.7 --  NEUTROABS -- -- 2.4  HGB 11.8* 11.6* --  HCT 36.5 35.2* --  MCV 96.1 95.9 --  PLT 125* 137* --   Coagulation:  Lab 08/25/11 2152  LABPROT 13.0  INR 0.96   Cardiac Enzymes:  Lab 08/25/11 2152  CKTOTAL 293*  CKMB 8.2*  CKMBINDEX --  TROPONINI <0.30   Lipid Panel    Component Value Date/Time   CHOL 171 08/26/2011 0625   TRIG 62 08/26/2011 0625   HDL 70 08/26/2011 0625   CHOLHDL 2.4 08/26/2011 0625   VLDL 12 08/26/2011 0625   LDLCALC 89 08/26/2011 0625   HgbA1C  Lab Results  Component Value Date   HGBA1C 5.9* 08/26/2011   Urine Drug Screen:     Component Value Date/Time  LABOPIA NONE DETECTED 08/26/2011 0004   COCAINSCRNUR NONE DETECTED 08/26/2011 0004   LABBENZ NONE DETECTED 08/26/2011 0004   AMPHETMU NONE DETECTED 08/26/2011 0004   THCU NONE DETECTED 08/26/2011 0004   LABBARB NONE DETECTED 08/26/2011 0004    Alcohol Level: No results found for this basename: ETH:2 in the last 168 hours  Ct Cervical Spine Wo Contrast  08/25/2011   1.  Advance cervical spondylosis without fracture or acute subluxation observed.   CT of the brain  08/25/2011  1.  No acute intracranial findings. 2.  Mild scalp hematoma along the center of the forehead. 3.  Mild chronic maxillary and ethmoid sinusitis.  Hand 2 View Right  08/27/2011   Degenerative changes throughout the right hand and wrist.  No acute fractures demonstrated.    Hand 2 View Left  08/27/2011 Degenerative changes throughout the left hand and wrist.  No acute fractures demonstrated.    MRI of the brain  08/27/2011   1.  Normal for age  MRI appearance of the brain. 2.  Advanced cervical spine degenerative changes.   MRA of the brain  08/27/2011   Negative intracranial MRA.    2D Echocardiogram  EF 55-60% with no source of embolus. Echolucent area (? fluid filled) on subcostal views most likely in liver; suggest CT or MR of the abdomen to better assess.   Carotid Doppler  No internal carotid artery stenosis bilaterally. Vertebrals with antegrade flow bilaterally.   CXR    EKG  normal sinus rhythm.   Therapy Recommendations PT -, OT -  Physical Exam  frail elderly african american lady not in distress. Awake alert. Afebrile. Head shows scalp bruise on forehead, nose and right cheek.  Neck is supple without bruit. Hearing is normal. Cardiac exam no murmur or gallop. Lungs are clear to auscultation. Distal pulses are well felt. Neurological exam ; Awake  Alert oriented x 3.Diminished recall and attention. Normal speech and language.eye movements full without nystagmus. Face symmetric. Tongue midline. Normal strength, tone, reflexes and coordination except left hand weakness including grip and intrinsic hand muscles. Tinel`s sign negative over left elbow.. Normal sensation. Gait deferred.    ASSESSMENT [name] is a 78 y.o. female with a fall without warning. MRI negative for acute stroke. Possibly small infarct not see on MRI vs neck injury during the fall. On aspirin 325 mg orally every day and clopidogrel 75 mg orally every day prior to admission. Now on aspirin 325 mg orally every day and clopidogrel 75 mg orally every day for secondary stroke prevention.  Patient with resultant  New left hand  weakness of unclear etiology- stroke unlikely given negative MRI but cervical radiculopathy from trauma from fall possible  -CAD, MI, hx stent, last cath 2011 -hyperlipidemia -hypertension -PVD -RAD -hx medication noncompliance -hx LLE phlebitis -Echolucent area (? fluid filled) most likely in liver per 2D  Hospital day  # 2  TREATMENT/PLAN -Continue aspirin 325 mg orally every day and clopidogrel 75 mg orally every day for now given cardiac history -therapy evals, PT & OT -cervical MRI to eval hand weakness -Consider follow up nerve conduction studies as an OP if left hemiparesis does not improve. -D/w patient and family  Burnetta Sabin, AVNP, ANP-BC, GNP-BC [facility] Pager: [phone] 08/27/2011 8:28 AM  Scribe for Dr. Antony Contras, [place] Director. He has personally reviewed chart, pertinent data, examined the patient and developed the plan of care. Pager:  [phone]

## 2011-08-27 NOTE — Evaluation (Signed)
Physical Therapy Evaluation Patient Details Name: IVALOU CIRIELLO MRN: [id number] DOB: 1926-01-03 Today's Date: 08/27/2011 Time: 1400-1430 PT Time Calculation (min): 30 min  PT Assessment / Plan / Recommendation Clinical Impression  pt presents s/p fall with c/o L sided weakness.  pt notes Bil hands very weak and edematous.  pt notes baseline is for hands to be stiff in am and she uses heat to return to normal function.  pt will have good support from family at D/C and would benefit from [place].      PT Assessment  Patient needs continued PT services    Follow Up Recommendations  Home health PT;Supervision - Intermittent    Barriers to Discharge None      lEquipment Recommendations  None recommended by PT    Recommendations for Other Services     Frequency Min 4X/week    Precautions / Restrictions Precautions Precautions: Fall Restrictions Weight Bearing Restrictions: No   Pertinent Vitals/Pain C/O pain in Bil hands and L knee since fall.        Mobility  Bed Mobility Bed Mobility: Sit to Supine Sit to Supine: 6: Modified independent (Device/Increase time) Transfers Transfers: Sit to Stand;Stand to Sit Sit to Stand: 4: Min guard;With upper extremity assist;With armrests;From chair/3-in-1 Stand to Sit: 5: Supervision;With upper extremity assist;To bed Details for Transfer Assistance: cues for use of UEs, getting closer to bed prior to sitting and control descent.   Ambulation/Gait Ambulation/Gait Assistance: 4: Min guard Ambulation Distance (Feet): 80 Feet Assistive device: Rolling walker Ambulation/Gait Assistance Details: pt distance limited by fatigue and notes tightness around L knee.   Gait Pattern: Step-through pattern;Decreased stride length;Trunk flexed;Decreased stance time - left (decreased stance secondary to pain.  ) Stairs: No Wheelchair Mobility Wheelchair Mobility: No    Exercises     PT Diagnosis: Difficulty walking  PT Problem List: Decreased  activity tolerance;Decreased balance;Decreased mobility;Decreased knowledge of use of DME;Cardiopulmonary status limiting activity;Pain PT Treatment Interventions: DME instruction;Gait training;Stair training;Functional mobility training;Therapeutic activities;Therapeutic exercise;Balance training;Neuromuscular re-education;Patient/family education   PT Goals Acute Rehab PT Goals PT Goal Formulation: With patient Time For Goal Achievement: 09/03/11 Potential to Achieve Goals: Good Pt will go Supine/Side to Sit: Independently PT Goal: Supine/Side to Sit - Progress: Goal set today Pt will go Sit to Supine/Side: Independently PT Goal: Sit to Supine/Side - Progress: Goal set today Pt will go Sit to Stand: with modified independence;with upper extremity assist PT Goal: Sit to Stand - Progress: Goal set today Pt will go Stand to Sit: with modified independence;with upper extremity assist PT Goal: Stand to Sit - Progress: Goal set today Pt will Ambulate: >150 feet;with modified independence;with rolling walker PT Goal: Ambulate - Progress: Goal set today Pt will Go Up / Down Stairs: 3-5 stairs;with supervision;with least restrictive assistive device PT Goal: Up/Down Stairs - Progress: Goal set today  Visit Information  Last PT Received On: 08/27/11 Assistance Needed: +1    Subjective Data  Subjective: My hands have hurt since I fell.   Patient Stated Goal: Home with family.     Prior Functioning  Home Living Lives With: Daughter Available Help at Discharge: Family;Available PRN/intermittently (Another Dtr lives near by) Type of Home: House Home Access: Stairs to enter [employer] of Steps: 2-3 Entrance Stairs-Rails: None Home Layout: One level Bathroom Shower/Tub: Product/process development scientist: Standard Bathroom Accessibility: Yes How Accessible: Accessible via walker Home Adaptive Equipment: Bedside commode/3-in-1;Tub transfer bench;Walker -  rolling Additional Comments: One Dtr lives with her, but has RA  and unable to provide much A.  Another Dtr works during day, but checks on pt daily.  Wendall Mola is visiting and can stay with pt for ~1 week.   Prior Function Level of Independence: Independent Able to Take Stairs?: Yes Driving: Yes Vocation: Retired Corporate investment banker: No difficulties Dominant Hand: Right    Cognition  Overall Cognitive Status: Appears within functional limits for tasks assessed/performed Arousal/Alertness: Awake/alert Orientation Level: Oriented X4 / Intact Behavior During Session: WFL for tasks performed    Extremity/Trunk Assessment Right Lower Extremity Assessment RLE ROM/Strength/Tone: Within functional levels RLE Sensation: WFL - Light Touch Left Lower Extremity Assessment LLE ROM/Strength/Tone: WFL for tasks assessed;Due to pain LLE ROM/Strength/Tone Deficits: pt functional mobility of L LE, however painful and guards mobility secondary to fell on L knee.   LLE Sensation: WFL - Light Touch   Balance Balance Balance Assessed: No  End of Session PT - End of Session Equipment Utilized During Treatment: Gait belt Activity Tolerance: Patient limited by fatigue Patient left: in bed;with call bell/phone within reach;with family/visitor present Nurse Communication: Mobility status   Catarina Hartshorn, [place] 08/27/2011, 2:48 PM

## 2011-08-28 DIAGNOSIS — I2510 Atherosclerotic heart disease of native coronary artery without angina pectoris: Secondary | ICD-10-CM

## 2011-08-28 DIAGNOSIS — G8190 Hemiplegia, unspecified affecting unspecified side: Secondary | ICD-10-CM

## 2011-08-28 DIAGNOSIS — G459 Transient cerebral ischemic attack, unspecified: Secondary | ICD-10-CM

## 2011-08-28 DIAGNOSIS — I10 Essential (primary) hypertension: Secondary | ICD-10-CM

## 2011-08-28 LAB — BASIC METABOLIC PANEL
CO2: 25 mEq/L (ref 19–32)
Calcium: 10.8 mg/dL — ABNORMAL HIGH (ref 8.4–10.5)
Glucose, Bld: 91 mg/dL (ref 70–99)
Potassium: 3.9 mEq/L (ref 3.5–5.1)
Sodium: 143 mEq/L (ref 135–145)

## 2011-08-28 NOTE — ED Provider Notes (Signed)
I saw and evaluated the patient, reviewed the resident's note and I agree with the findings and plan.   Delora Fuel, MD [id number]

## 2011-08-28 NOTE — Progress Notes (Signed)
Occupational Therapy Evaluation Patient Details Name: [name] MRN: [id number] DOB: December 28, 1925 Today's Date: 08/28/2011 Time: [id number] OT Time Calculation (min): 35 min  OT Assessment / Plan / Recommendation Clinical Impression  Pt admitted s/p fall and c/o left sided weakness.  Pt with arthritic pain and decreased ROM in bil. hands, however, pt reports left hand feels worse.  Reports she typically feels stiff in the AM and then warms her hands up.  Will benefit from acute OT  to address below problem list.  Recommending HHOT as pt reports that she will have necessary level of assist at home from family.    OT Assessment  Patient needs continued OT Services    Follow Up Recommendations  Supervision/Assistance - 24 hour;Home health OT    Barriers to Discharge      Equipment Recommendations  None recommended by OT    Recommendations for Other Services    Frequency  Min 2X/week    Precautions / Restrictions Precautions Precautions: Fall Restrictions Weight Bearing Restrictions: No   Pertinent Vitals/Pain     ADL  Grooming: Performed;Denture care;Minimal assistance Where Assessed - Grooming: Unsupported sitting Upper Body Bathing: Simulated;Set up Where Assessed - Upper Body Bathing: Sitting, bed;Unsupported Lower Body Bathing: Simulated;Set up Where Assessed - Lower Body Bathing: Sitting, bed;Unsupported Upper Body Dressing: Simulated;Set up Where Assessed - Upper Body Dressing: Sitting, bed;Unsupported Lower Body Dressing: Performed;Set up Where Assessed - Lower Body Dressing: Unsupported;Sitting, bed Toilet Transfer: Simulated;Other (comment) (min guard) Toilet Transfer Method: Stand pivot Toilet Transfer Equipment: Other (comment) (bed) Equipment Used: Other (comment) (foam tubing to build up grip of grooming and eating utensils) ADL Comments: Pt limited by arthritic pain and decreased ROM in bil. hands.  Provided cueing for pt to stabilize dentures on table with  left hand while brushing them with Rt hand rather than trying to hold and manipulate dentures in left hand.  Pt reports difficulty with holding fork during meals.  Provided pt with foam tubing to build up grip of toothbrush and eating utensils. Pt required 2 rest breaks during grooming ADL due to bil. hand fatigue and pain. Anticipate pt will require assistance with fine motor tasks such as opening containers and fastening buttons.        OT Diagnosis: Generalized weakness;Acute pain  OT Problem List: Decreased strength;Decreased activity tolerance;Pain;Impaired UE functional use OT Treatment Interventions: Self-care/ADL training;Energy conservation;Therapeutic activities;DME and/or AE instruction;Patient/family education   OT Goals Acute Rehab OT Goals OT Goal Formulation: With patient Time For Goal Achievement: 09/04/11 Potential to Achieve Goals: Good ADL Goals Pt Will Perform Grooming: with modified independence;Sitting at sink;Standing at sink;with adaptive equipment ADL Goal: Grooming - Progress: Goal set today Pt Will Perform Tub/Shower Transfer: Tub transfer;with modified independence;Ambulation;with DME;Transfer tub bench ADL Goal: Tub/Shower Transfer - Progress: Goal set today Miscellaneous OT Goals Miscellaneous OT Goal #1: Pt will be able to perform 3/3 toileting tasks with mod I. OT Goal: Miscellaneous Goal #1 - Progress: Goal set today  Visit Information  Last OT Received On: 08/28/11 Assistance Needed: +1    Subjective Data      Prior Functioning  Home Living Lives With: Daughter Available Help at Discharge: Family;Available PRN/intermittently (Another Dtr lives near by) Type of Home: House Home Access: Stairs to enter [employer] of Steps: 2-3 Entrance Stairs-Rails: None Home Layout: One level Bathroom Shower/Tub: Product/process development scientist: Standard Bathroom Accessibility: Yes How Accessible: Accessible via walker Home Adaptive  Equipment: Bedside commode/3-in-1;Tub transfer bench;Walker - rolling Additional Comments: One Dtr  lives with her, but has RA and unable to provide much A.  Another Dtr works during day but checks on pt daily.  Wendall Mola is visiting and can stay with pt for ~1 week.   Prior Function Level of Independence: Independent Able to Take Stairs?: Yes Driving: Yes Vocation: Retired Corporate investment banker: No difficulties Dominant Hand: Right    Cognition  Overall Cognitive Status: Appears within functional limits for tasks assessed/performed Arousal/Alertness: Awake/alert Orientation Level: Oriented X4 / Intact Behavior During Session: [facility] for tasks performed    Extremity/Trunk Assessment Right Upper Extremity Assessment RUE ROM/Strength/Tone: Deficits RUE ROM/Strength/Tone Deficits: Decreased digit ROM and with decreased grip strength.  Able to perform ~1/2 ROM for digit flexion.  Shoulder, elbow and wrist WFL. RUE Sensation: WFL - Light Touch RUE Coordination: Deficits;WFL - gross motor RUE Coordination Deficits: Decreased fine motor coordination due to arthritis Left Upper Extremity Assessment LUE ROM/Strength/Tone: Deficits LUE ROM/Strength/Tone Deficits: Decreased digit ROM and with decreased grip strength.  Able to perform ~1/2 ROM for digit flexion.  Shoulder, elbow and wrist WFL. LUE Sensation: WFL - Light Touch LUE Coordination: WFL - gross motor;Deficits LUE Coordination Deficits: Decreased fine motor coordination due to arthritis   Mobility Bed Mobility Bed Mobility: Supine to Sit;Sit to Supine Supine to Sit: 6: Modified independent (Device/Increase time) Sit to Supine: 6: Modified independent (Device/Increase time) Details for Bed Mobility Assistance: Required increased time Transfers Sit to Stand: 5: Supervision;With upper extremity assist;From bed Stand to Sit: 5: Supervision;With upper extremity assist;To bed Details for Transfer Assistance: vc's for safe hand placement    Exercise    Balance    End of Session OT - End of Session Activity Tolerance: Patient limited by fatigue;Patient limited by pain Patient left: in bed;with call bell/phone within reach;with bed alarm set;with nursing in room Nurse Communication: Patient requests pain meds  08/28/2011 Darrol Jump OTR/L Pager [phone] Office [phone]  Darrol Jump 08/28/2011, 5:09 PM

## 2011-08-28 NOTE — Progress Notes (Signed)
Stroke Team Progress Note  HISTORY [name] is an 78 y.o. female who was walking behind her granddaughter 08/25/2011. Without warning fell. No dizziness or lightheadedness. After falling was noted to have left upper and lower extremity weakness. Left upper extremity was numb as well. No facial complaints. Patient was brought in by EMS as a code stroke. Patient was not a TPA candidate secondary to rapid improvement in symptoms. She was admitted for further evaluation and treatment.  SUBJECTIVE Patient could not tolerated CSpine MRI yesterday due to having to lay flat. She complained of "pulling in her chest". She thinks the stents in her heart were pulling and refused to have the procedure.  OBJECTIVE Most recent Vital Signs: Filed Vitals:   08/27/11 1800 08/27/11 2200 08/28/11 0200 08/28/11 0600  BP: 138/75 160/83 100/66 115/72  Pulse: 78 66 63 65  Temp: 98.5 F (36.9 C) 98.7 F (37.1 C) 98.3 F (36.8 C) 97.7 F (36.5 C)  TempSrc: Oral Oral Oral Oral  Resp: 18 18 18 18   Height:      Weight:      SpO2: 99% 97% 93% 95%   CBG (last 3)  Basename 08/26/11 1131 08/26/11 0702 08/25/11 2234  GLUCAP 105* 99 100*   Intake/Output from previous day:  IV Fluid Intake:     . DISCONTD: sodium chloride 75 mL/hr at 08/26/11 0409   MEDICATIONS    . aspirin  325 mg Oral Daily  . bimatoprost  1 drop Both Eyes QHS  . brimonidine  1 drop Right Eye BID  . clopidogrel  75 mg Oral Daily  . enoxaparin  30 mg Subcutaneous Q24H  . HYDROcodone-acetaminophen  1 tablet Oral TID  . metoprolol succinate  50 mg Oral Daily  . ramipril  10 mg Oral Daily  . simvastatin  20 mg Oral q1800   PRN:  nitroGLYCERIN, oxyCODONE  Diet:  Cardiac thin liquids Activity:   Bathroom privileges with assistance DVT Prophylaxis:  Lovenox 30 mg sq daily   CLINICALLY SIGNIFICANT STUDIES Basic Metabolic Panel:  Lab [id number] 0555 08/27/11 0555  NA 143 142  K 3.9 4.5  CL 109 111  CO2 25 23  GLUCOSE 91 114*  BUN  12 9  CREATININE 0.81 0.76  CALCIUM 10.8* 10.4  MG -- --  PHOS -- --   Liver Function Tests:  Lab 08/27/11 0555 08/26/11 0625  AST 24 23  ALT 15 15  ALKPHOS 67 78  BILITOT 0.2* 0.2*  PROT 6.0 6.4  ALBUMIN 2.9* 3.2*   CBC:  Lab 08/27/11 0555 08/26/11 0625 08/25/11 2152  WBC 6.2 7.7 --  NEUTROABS -- -- 2.4  HGB 11.8* 11.6* --  HCT 36.5 35.2* --  MCV 96.1 95.9 --  PLT 125* 137* --   Coagulation:  Lab 08/25/11 2152  LABPROT 13.0  INR 0.96   Cardiac Enzymes:  Lab 08/25/11 2152  CKTOTAL 293*  CKMB 8.2*  CKMBINDEX --  TROPONINI <0.30   Lipid Panel    Component Value Date/Time   CHOL 171 08/26/2011 0625   TRIG 62 08/26/2011 0625   HDL 70 08/26/2011 0625   CHOLHDL 2.4 08/26/2011 0625   VLDL 12 08/26/2011 0625   LDLCALC 89 08/26/2011 0625   HgbA1C  Lab Results  Component Value Date   HGBA1C 5.9* 08/26/2011   Urine Drug Screen:     Component Value Date/Time   LABOPIA NONE DETECTED 08/26/2011 0004   COCAINSCRNUR NONE DETECTED 08/26/2011 0004   LABBENZ NONE DETECTED 08/26/2011  0004   AMPHETMU NONE DETECTED 08/26/2011 0004   THCU NONE DETECTED 08/26/2011 0004   LABBARB NONE DETECTED 08/26/2011 0004    Ct Cervical Spine Wo Contrast  08/25/2011   1.  Advance cervical spondylosis without fracture or acute subluxation observed.   CT of the brain  08/25/2011  1.  No acute intracranial findings. 2.  Mild scalp hematoma along the center of the forehead. 3.  Mild chronic maxillary and ethmoid sinusitis.  Hand 2 View Right  08/27/2011   Degenerative changes throughout the right hand and wrist.  No acute fractures demonstrated.    Hand 2 View Left  08/27/2011 Degenerative changes throughout the left hand and wrist.  No acute fractures demonstrated.    MRI of the brain  08/27/2011   1.  Normal for age MRI appearance of the brain. 2.  Advanced cervical spine degenerative changes.   MRA of the brain  08/27/2011   Negative intracranial MRA.    2D Echocardiogram  EF 55-60% with no source of  embolus. Echolucent area (? fluid filled) on subcostal views most likely in liver; suggest CT or MR of the abdomen to better assess.   Carotid Doppler  No internal carotid artery stenosis bilaterally. Vertebrals with antegrade flow bilaterally.   CXR    EKG  normal sinus rhythm.   Therapy Recommendations PT -home health, OT -  Physical Exam  frail elderly african american lady not in distress. Awake alert. Afebrile. Head shows scalp bruise on forehead, nose and right cheek.  Neck is supple without bruit. Hearing is normal. Cardiac exam no murmur or gallop. Lungs are clear to auscultation. Distal pulses are well felt. Neurological exam ; Awake  Alert oriented x 3.Diminished recall and attention. Normal speech and language.eye movements full without nystagmus. Face symmetric. Tongue midline. Normal strength, tone, reflexes and coordination except left hand weakness including grip and intrinsic hand muscles. Tinel`s sign negative over left elbow.. Normal sensation. Gait deferred.    ASSESSMENT [name] is a 78 y.o. female with a fall without warning. MRI negative for acute stroke. Possibly small infarct not see on MRI vs neck injury during the fall. On aspirin 325 mg orally every day and clopidogrel 75 mg orally every day prior to admission. Now on aspirin 325 mg orally every day and clopidogrel 75 mg orally every day for secondary stroke prevention. Patient with resultant  New left hand  weakness of unclear etiology- stroke unlikely given negative MRI but cervical radiculopathy from trauma from fall possible. Patient unable to tolerate MRI. Does not want neck OR per Neurosurgery note.  -CAD, MI, hx stent, last cath 2011 -hyperlipidemia -hypertension -PVD -RAD -hx medication noncompliance -hx LLE phlebitis -Echolucent area (? fluid filled) most likely in liver per 2D -patient was unable to tolerated cervical MRI  Hospital day # 3  TREATMENT/PLAN -Continue aspirin 325 mg orally  every day and clopidogrel 75 mg orally every day for now given cardiac history -cancel cervical MRI, pt refuses  -Consider follow up nerve conduction studies as an OP if left hemiparesis does not improve. -D/w patient -Stroke Service will sign off. Follow up with Dr. Leonie Man in 2 months.  Steffanie Rainwater, ANP-BC, GNP-BC [facility] Pager: [phone] 08/28/2011 8:35 AM  Scribe for Dr. Antony Contras, [facility] Director. He has personally reviewed chart, pertinent data, examined the patient and developed the plan of care. Pager:  [phone]

## 2011-08-28 NOTE — Progress Notes (Signed)
Patient ID: [name], female   DOB: 02/22/1926, 78 y.o.   MRN: [id number] Patient seen and appears to be doing fine. I note that MRI has been canceled because patient cannot lie flat. My index of suspicion that anything absolutely requiring surgery will be found his very low. In addition patient has already stated her choice that she would not want to do any surgery. In that regard I will sign off on this patient. Please recontact [place] if further consultation is requested or desired.

## 2011-08-28 NOTE — Progress Notes (Signed)
Subjective: Feels better this AM, although still has some pain.  Objective: Vital signs in last 24 hours: Temp:  [97.7 F (36.5 C)-98.7 F (37.1 C)] 97.7 F (36.5 C) (05/15 0600) Pulse Rate:  [63-89] 65  (05/15 0600) Resp:  [18] 18  (05/15 0600) BP: (100-160)/(64-83) 115/72 mmHg (05/15 0600) SpO2:  [93 %-99 %] 95 % (05/15 0600) Weight change:  Last BM Date: 08/27/11  Intake/Output from previous day:       Physical Exam: General: Comfortable, alert, communicative, fully oriented, not short of breath at rest.  HEENT:  No clinical pallor, no jaundice, no conjunctival injection or discharge. Has obvious forehead bruising, and nasal/facial excoriations. Hydration status seems fair. NECK:  Supple, JVP not seen, no carotid bruits, no palpable lymphadenopathy, no palpable goiter. CHEST:  Clinically clear to auscultation, no wheezes, no crackles. HEART:  Sounds 1 and 2 heard, normal, regular, no murmurs. ABDOMEN:  Full, soft, non-tender, no palpable organomegaly, no palpable masses, normal bowel sounds. GENITALIA:  Not examined. LOWER EXTREMITIES:  No pitting edema, palpable peripheral pulses. Has abrasion of left knee. MUSCULOSKELETAL SYSTEM:  Generalized osteoarthritic changes, otherwise, normal. CENTRAL NERVOUS SYSTEM:  Patient has weakness of hand grip bilaterally, but obviously worse on left. Planter response is slightly up-going on left, otherwise, no focal neurologic deficit on gross examination.  Lab Results:  Basename 08/27/11 0555 08/26/11 0625  WBC 6.2 7.7  HGB 11.8* 11.6*  HCT 36.5 35.2*  PLT 125* 137*    Basename 08/28/11 0555 08/27/11 0555  NA 143 142  K 3.9 4.5  CL 109 111  CO2 25 23  GLUCOSE 91 114*  BUN 12 9  CREATININE 0.81 0.76  CALCIUM 10.8* 10.4   No results found for this or any previous visit (from the past 240 hour(s)).   Studies/Results: Mri Brain Without Contrast  08/27/2011  *RADIOLOGY REPORT*  Clinical Data:  78 year old female.  History of  seizures. Transient ischemic attack.  Comparison: Head CT 08/25/2011.  MRI HEAD WITHOUT CONTRAST  Technique: Multiplanar, multiecho pulse sequences of the brain and surrounding structures were obtained according to standard protocol without intravenous contrast.  Findings: No restricted diffusion to suggest acute infarction.  No midline shift, mass effect, evidence of mass lesion, ventriculomegaly, extra-axial collection or acute intracranial hemorrhage.  Cervicomedullary junction and pituitary are within normal limits.  Major intracranial vascular flow voids are preserved. Pearline Cables and white matter signal is within normal limits for age throughout the brain.  Multilevel degenerative changes in the cervical spine, advanced at C5-C6.  A degree of degenerative cervical spinal stenosis is suspected.  Outside of the degenerative marrow changes, bone marrow signal is within normal limits. Prominent but benign appearing vascular structure traverses the clivus.  Postoperative changes to the globes. Visualized paranasal sinuses and mastoids are clear. Negative scalp soft tissues.  IMPRESSION: 1.  Normal for age MRI appearance of the brain. 2.  Advanced cervical spine degenerative changes. 3.  MRA findings are below.  MRA HEAD WITHOUT CONTRAST  Technique: Angiographic images of the Circle of Willis were obtained using MRA technique without  intravenous contrast.  Findings: Antegrade flow in the posterior circulation.  Mildly dominant distal right vertebral artery.  Normal left PICA. Tortuous vertebrobasilar junction.  No basilar artery stenosis. Normal AICA vessels. Normal superior cerebellar artery and PCA origins.  Normal posterior communicating arteries.  Bilateral PCA branches are within normal limits.  Antegrade flow in both ICA siphons.  No ICA stenosis.  Ophthalmic and posterior communicating artery origins are within  normal limits.  Normal carotid termini, MCA and ACA origins.  Diminutive or absent anterior communicating  artery.  Visualized ACA branches are within normal limits.  Visualized bilateral MCA branches are within normal limits.  IMPRESSION:  Negative intracranial MRA.  Original Report Authenticated By: Randall An, M.D.   Dg Hand 2 View Right  08/27/2011  *RADIOLOGY REPORT*  Clinical Data: Pain and swelling after fall.  RIGHT HAND - 2 VIEW  Comparison: None.  Findings: Degenerative changes involving the radial carpal, STT, first carpometacarpal, first metacarpal phalangeal, and multiple interphalangeal joints.  Subcortical cysts are present.  No definite erosive changes.  No evidence of acute fracture or subluxation.  IMPRESSION: Degenerative changes throughout the right hand and wrist.  No acute fractures demonstrated.  Original Report Authenticated By: Neale Burly, M.D.   Dg Hand 2 View Left  08/27/2011  *RADIOLOGY REPORT*  Clinical Data: Pain and swelling in both hands.  Fall.  LEFT HAND - 2 VIEW  Comparison: None.  Findings: Degenerative changes involving the radial carpal, STT, first carpometacarpal, first metacarpal phalangeal, second metacarpal phalangeal, and multiple interphalangeal joints. Subcortical cystic changes in the proximal phalanx of the second finger.  No definite evidence of bone erosion.  No acute fractures or subluxations are appreciated.  IMPRESSION: Degenerative changes throughout the left hand and wrist.  No acute fractures demonstrated.  Original Report Authenticated By: Neale Burly, M.D.   Mr Mra Head/brain Wo Cm  08/27/2011  *RADIOLOGY REPORT*  Clinical Data:  78 year old female.  History of seizures. Transient ischemic attack.  Comparison: Head CT 08/25/2011.  MRI HEAD WITHOUT CONTRAST  Technique: Multiplanar, multiecho pulse sequences of the brain and surrounding structures were obtained according to standard protocol without intravenous contrast.  Findings: No restricted diffusion to suggest acute infarction.  No midline shift, mass effect, evidence of mass lesion,  ventriculomegaly, extra-axial collection or acute intracranial hemorrhage.  Cervicomedullary junction and pituitary are within normal limits.  Major intracranial vascular flow voids are preserved. Pearline Cables and white matter signal is within normal limits for age throughout the brain.  Multilevel degenerative changes in the cervical spine, advanced at C5-C6.  A degree of degenerative cervical spinal stenosis is suspected.  Outside of the degenerative marrow changes, bone marrow signal is within normal limits. Prominent but benign appearing vascular structure traverses the clivus.  Postoperative changes to the globes. Visualized paranasal sinuses and mastoids are clear. Negative scalp soft tissues.  IMPRESSION: 1.  Normal for age MRI appearance of the brain. 2.  Advanced cervical spine degenerative changes. 3.  MRA findings are below.  MRA HEAD WITHOUT CONTRAST  Technique: Angiographic images of the Circle of Willis were obtained using MRA technique without  intravenous contrast.  Findings: Antegrade flow in the posterior circulation.  Mildly dominant distal right vertebral artery.  Normal left PICA. Tortuous vertebrobasilar junction.  No basilar artery stenosis. Normal AICA vessels. Normal superior cerebellar artery and PCA origins.  Normal posterior communicating arteries.  Bilateral PCA branches are within normal limits.  Antegrade flow in both ICA siphons.  No ICA stenosis.  Ophthalmic and posterior communicating artery origins are within normal limits.  Normal carotid termini, MCA and ACA origins.  Diminutive or absent anterior communicating artery.  Visualized ACA branches are within normal limits.  Visualized bilateral MCA branches are within normal limits.  IMPRESSION:  Negative intracranial MRA.  Original Report Authenticated By: Randall An, M.D.   2D-echo - Left ventricle: The cavity size was normal. There was mild focal basal  hypertrophy of the septum. Systolic function was normal. The estimated  ejection fraction was in the range of 55% to 60%. Wall motion was normal; there were no regional wall motion abnormalities. Features are consistent with a pseudonormal left ventricular filling pattern, with concomitant abnormal relaxation and increased filling pressure (grade 2 diastolic dysfunction). - Mitral valve: Mild regurgitation.   - Echolucent area (? fluid filled) on subcostal views most likely in liver; suggest CT or MR of the abdomen to better assess.   Medications: Scheduled Meds:    . aspirin  325 mg Oral Daily  . bimatoprost  1 drop Both Eyes QHS  . brimonidine  1 drop Right Eye BID  . clopidogrel  75 mg Oral Daily  . enoxaparin  30 mg Subcutaneous Q24H  . HYDROcodone-acetaminophen  1 tablet Oral TID  . metoprolol succinate  50 mg Oral Daily  . ramipril  10 mg Oral Daily  . simvastatin  20 mg Oral q1800   Continuous Infusions:    . DISCONTD: sodium chloride 75 mL/hr at 08/26/11 0409   PRN Meds:.nitroGLYCERIN, oxyCODONE  Assessment/Plan:  1. TIA: Patient's left upper extremity weakness, appears at base line, at this point. Brain MRI shows normal for age appearance, Brain MRA is negative, carotid Doppler showed no significant extracranial carotid artery stenosis, and veterbrals are patent with antegrade flow. 2-D echocardiogram revealed normal left ventricular cavity size, mild focal basal hypertrophy of the septum, ejection fraction of 55% to 60% and no regional wall motion abnormalities. Neurology consultation was provided by Dr Alexis Goodell, and DR Antony Contras, saw patient on 08/27/11. Patient appears to have had aTIA. She continues on  Aspirin/Plavix and PT/OT. Lipid profile: TC 171, TG 62, HDL 70 and LDL89. We shall continue statin treatment in current dose, although there is room for improvement. -Continuing aspirin/Plavix as per neuro recommendations. 2. Fall: This is secondary to #1, and is complicated by scalp hematoma, facial excoriations an d UE/LE  bruising and left knee abrasion. Fortunately, there appear to be no bony injuries. We are managing with scheduled analgesics, with satisfactory results.   3. Cervical spondylosis. Patient has bilateral hand weakness, albeit Lt>Rt. Cervical spine CT, reveals advanced cervical spondylosis. Likely, patient has some cervical spondylotic myelopathy.  Patient was unable to Lay flat and so Cervical spine MRI cancelled, and per neurosurgeon-his index of suspicion that anything absolutely requiring surgery would've been found as very low and patient already stated that she would not want any surgery. -PT following, await further followup recommendations. 4. CAD, status post stenting: Patient appears stable from this viewpoint. She has no chest pain or dyspnea. Continue current medications.  5. Peripheral vascular disease status post left-sided femoro-popliteal bypass: Stable.  6. Hyperparathyroidism with hypercalcemia: This was diagnosed in 2003. Defer to primary MD. 7. Hypertension: BP is sub-optimally controlled. We shall continue pre-admission antihypertensives, for now, and adjust as indicated.  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   LOS: 3 days   Marice Guidone C 08/28/2011, 9:30 AM

## 2011-08-28 NOTE — Progress Notes (Signed)
Physical Therapy Treatment Patient Details Name: [name] MRN: [id number] DOB: 12/26/1925 Today's Date: 08/28/2011 Time: 1030-1050 PT Time Calculation (min): 20 min  PT Assessment / Plan / Recommendation Comments on Treatment Session  pt presents with TIA.  pt notes still sore Bil hands and with decreased motion.  ? how much of this is arthritic changes and question how pt had truly been doing at home.  Plan is still for Granddtr to stay with pt and a dtr to check in on her daily.      Follow Up Recommendations  Home health PT;Supervision - Intermittent    Barriers to Discharge        Equipment Recommendations  None recommended by PT    Recommendations for Other Services    Frequency Min 4X/week   Plan Discharge plan remains appropriate;Frequency remains appropriate    Precautions / Restrictions Precautions Precautions: Fall Restrictions Weight Bearing Restrictions: No   Pertinent Vitals/Pain     Mobility  Bed Mobility Bed Mobility: Supine to Sit;Sit to Supine Supine to Sit: 6: Modified independent (Device/Increase time) Sit to Supine: 6: Modified independent (Device/Increase time) Details for Bed Mobility Assistance: Needs increased time.   Transfers Transfers: Sit to Stand;Stand to Sit Sit to Stand: 5: Supervision;With upper extremity assist;From bed Stand to Sit: 5: Supervision;With upper extremity assist;To bed Details for Transfer Assistance: cues for use of UEs and getting closer to bed prior to sitting.   Ambulation/Gait Ambulation/Gait Assistance: 4: Min guard Ambulation Distance (Feet): 200 Feet Assistive device: Rolling walker Ambulation/Gait Assistance Details: cues to stay closer to RW, upright posture.  pt notes L knee starting to feel better today and less tight.   Gait Pattern: Step-through pattern;Decreased stride length;Trunk flexed Stairs: No Wheelchair Mobility Wheelchair Mobility: No    Exercises     PT Diagnosis:    PT Problem List:     PT Treatment Interventions:     PT Goals Acute Rehab PT Goals Time For Goal Achievement: 09/03/11 PT Goal: Supine/Side to Sit - Progress: Progressing toward goal PT Goal: Sit to Supine/Side - Progress: Progressing toward goal PT Goal: Sit to Stand - Progress: Progressing toward goal PT Goal: Stand to Sit - Progress: Progressing toward goal PT Goal: Ambulate - Progress: Progressing toward goal  Visit Information  Last PT Received On: 08/28/11 Assistance Needed: +1    Subjective Data  Subjective: My hands are still sore.     Cognition  Overall Cognitive Status: Appears within functional limits for tasks assessed/performed Arousal/Alertness: Awake/alert Orientation Level: Oriented X4 / Intact Behavior During Session: [facility] for tasks performed    Balance  Balance Balance Assessed: No  End of Session PT - End of Session Equipment Utilized During Treatment: Gait belt Activity Tolerance: Patient tolerated treatment well Patient left: in bed;with call bell/phone within reach;with bed alarm set Nurse Communication: Mobility status    Catarina Hartshorn, [place] 08/28/2011, 11:06 AM

## 2011-08-29 DIAGNOSIS — G459 Transient cerebral ischemic attack, unspecified: Secondary | ICD-10-CM | POA: Diagnosis not present

## 2011-08-29 DIAGNOSIS — I2510 Atherosclerotic heart disease of native coronary artery without angina pectoris: Secondary | ICD-10-CM | POA: Diagnosis not present

## 2011-08-29 DIAGNOSIS — I10 Essential (primary) hypertension: Secondary | ICD-10-CM | POA: Diagnosis not present

## 2011-08-29 MED ORDER — HYDROCODONE-ACETAMINOPHEN 5-325 MG PO TABS: 1.0000 | ORAL_TABLET | Freq: Three times a day (TID) | ORAL | Status: AC | PRN

## 2011-08-29 NOTE — Discharge Summary (Signed)
Discharge Note  Name: [name] MRN: [id number] DOB: Nov 23, 1925 78 y.o.  Date of Admission: 08/25/2011  9:50 PM Date of Discharge: 08/29/2011 Attending Physician: Sheila Oats, MD  Discharge Diagnosis: Principal Problem:  *TIA (transient ischemic attack)/possible small infarct not seen on MRI-per neuro Active Problems:  HYPERLIPIDEMIA TYPE IIB / III  HYPERTENSION, BENIGN  CAD, NATIVE VESSEL  PVD  Fall   Discharge Medications: Medication List  As of 08/29/2011 10:38 AM   STOP taking these medications         NITROSTAT 0.4 MG SL tablet      PLAVIX 75 MG tablet         TAKE these medications         aspirin 325 MG tablet   Take 325 mg by mouth daily.      bimatoprost 0.03 % ophthalmic solution   Commonly known as: LUMIGAN   Apply 1 drop to eye at bedtime.      brimonidine 0.15 % ophthalmic solution   Commonly known as: ALPHAGAN   Place 1 drop into the right eye 2 (two) times daily.      clopidogrel 75 MG tablet   Commonly known as: PLAVIX   Take 75 mg by mouth daily.      Co Q-10 50 MG Caps   Take 1 capsule by mouth daily.      HAIR/SKIN/NAILS PO   Take 1 tablet by mouth daily.      metoprolol succinate 50 MG 24 hr tablet   Commonly known as: TOPROL-XL   Take 1 tablet (50 mg total) by mouth daily.      nitroGLYCERIN 0.4 MG SL tablet   Commonly known as: NITROSTAT   Place 0.4 mg under the tongue every 5 (five) minutes as needed. For chest pain      pravastatin 40 MG tablet   Commonly known as: PRAVACHOL   Take 40 mg by mouth daily.      ramipril 10 MG capsule   Commonly known as: ALTACE   Take 10 mg by mouth daily.      traMADol 50 MG tablet   Commonly known as: ULTRAM   Take 1-2 tablets (50-100 mg total) by mouth 2 (two) times daily. Take 2 tablets every morning and take 1 tablet every evening      Vitamin B-12 5000 MCG Subl   Place 1 tablet under the tongue daily.                      Hydrocodone-acetaminophen (Norco) 5-325mg        Take 1 tablet 3 times daily as needed for pain    Disposition and follow-up:   [name] was discharged from [facility] in improved/stable condition.    Follow-up Appointments: Discharge Orders    Future Orders Please Complete By Expires   Diet - low sodium heart healthy      Increase activity slowly      Home Health      Questions: Responses:   To provide the following care/treatments PT    OT   Face-to-face encounter      Comments:   I Zanaya Baize C certify that this patient is under my care and that I, or a nurse practitioner or physician's assistant working with me, had a face-to-face encounter that meets the physician face-to-face encounter requirements with this patient on 08/29/2011.       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 absences from home require considerable and taxing effort and are for medical reasons or religious services or infrequently or of short duration when for other reasons) Unable to leave home safely without assistance   To provide the following care/treatments PT    OT      Consultations:    Procedures Performed:  Ct Head Wo Contrast  08/25/2011  *RADIOLOGY REPORT*  Clinical Data:  Code stroke.  Fall.  Left-sided weakness.  Pain.  CT HEAD WITHOUT CONTRAST CT CERVICAL SPINE WITHOUT CONTRAST  Technique:  Multidetector CT imaging of the head and cervical spine was performed following the standard protocol without intravenous contrast.   Multiplanar CT image reconstructions of the cervical spine were also generated.  Comparison:  Report from 03/08/2002  CT HEAD  Findings: The brain stem, cerebellum, cerebral peduncles, thalami, basal ganglia, basilar cisterns, and ventricular system appear unremarkable.  No intracranial hemorrhage, mass lesion, or acute infarction is identified.  Mild scalp hematoma along the center of the forehead noted.  There is mild chronic maxillary and ethmoid sinusitis.  IMPRESSION:  1.  No acute intracranial findings. 2.  Mild scalp hematoma along the center of the forehead. 3.  Mild chronic maxillary and ethmoid sinusitis.  CT CERVICAL SPINE  Findings: Prominent cervical spondylosis and degenerative disc disease noted with multilevel calcified intervertebral discs, prominent loss of disc space at C5-6 with endplate sclerosis, posterior osseous ridging at all levels in the cervical spine, and prominent anterior intervertebral spurring at C5-6, C6-7, and C7- T1.  Facet arthropathy is most prominent on the left at C3-4, C4-5, and C5-6, and on the right that CT three, C3-4, and C4-5.  Mildly exaggerated upper cervical lordosis noted.  There is likely some central stenosis at C2-3 and C5-6 weighted to disc osteophyte complexes.  Erosive arthropathy of the medial clavicle noted.  No cervical spine fracture or acute subluxation is observed.  IMPRESSION:  1.  Advance cervical spondylosis without fracture or acute subluxation observed.  Critical Value/emergent results were called by telephone to Dr. Delora Fuel (who verbally acknowledged these results) at the time of interpretation on 08/25/2011 at 10:25 p.m.  Original Report Authenticated By: Carron Curie, M.D.   Ct Cervical Spine Wo Contrast  08/25/2011  *RADIOLOGY REPORT*  Clinical Data:  Code stroke.  Fall.  Left-sided weakness.  Pain.  CT HEAD WITHOUT CONTRAST CT CERVICAL SPINE WITHOUT CONTRAST  Technique:  Multidetector CT imaging of the head and cervical spine was  performed following the standard protocol without intravenous contrast.  Multiplanar CT image reconstructions of the cervical spine were also generated.  Comparison:  Report from 03/08/2002  CT HEAD  Findings: The brain stem, cerebellum, cerebral peduncles, thalami, basal ganglia, basilar cisterns, and ventricular system appear unremarkable.  No intracranial hemorrhage, mass lesion, or acute infarction is identified.  Mild scalp hematoma along the center of the forehead noted.  There is mild chronic maxillary and ethmoid sinusitis.  IMPRESSION:  1.  No acute intracranial findings. 2.  Mild scalp hematoma along the center of the forehead.  3.  Mild chronic maxillary and ethmoid sinusitis.  CT CERVICAL SPINE  Findings: Prominent cervical spondylosis and degenerative disc disease noted with multilevel calcified intervertebral discs, prominent loss of disc space at C5-6 with endplate sclerosis, posterior osseous ridging at all levels in the cervical spine, and prominent anterior intervertebral spurring at C5-6, C6-7, and C7- T1.  Facet arthropathy is most prominent on the left at C3-4, C4-5, and C5-6, and on the right that CT three, C3-4, and C4-5.  Mildly exaggerated upper cervical lordosis noted.  There is likely some central stenosis at C2-3 and C5-6 weighted to disc osteophyte complexes.  Erosive arthropathy of the medial clavicle noted.  No cervical spine fracture or acute subluxation is observed.  IMPRESSION:  1.  Advance cervical spondylosis without fracture or acute subluxation observed.  Critical Value/emergent results were called by telephone to Dr. Delora Fuel (who verbally acknowledged these results) at the time of interpretation on 08/25/2011 at 10:25 p.m.  Original Report Authenticated By: Carron Curie, M.D.   Mri Brain Without Contrast  08/27/2011  *RADIOLOGY REPORT*  Clinical Data:  78 year old female.  History of seizures. Transient ischemic attack.  Comparison: Head CT 08/25/2011.  MRI HEAD  WITHOUT CONTRAST  Technique: Multiplanar, multiecho pulse sequences of the brain and surrounding structures were obtained according to standard protocol without intravenous contrast.  Findings: No restricted diffusion to suggest acute infarction.  No midline shift, mass effect, evidence of mass lesion, ventriculomegaly, extra-axial collection or acute intracranial hemorrhage.  Cervicomedullary junction and pituitary are within normal limits.  Major intracranial vascular flow voids are preserved. Pearline Cables and white matter signal is within normal limits for age throughout the brain.  Multilevel degenerative changes in the cervical spine, advanced at C5-C6.  A degree of degenerative cervical spinal stenosis is suspected.  Outside of the degenerative marrow changes, bone marrow signal is within normal limits. Prominent but benign appearing vascular structure traverses the clivus.  Postoperative changes to the globes. Visualized paranasal sinuses and mastoids are clear. Negative scalp soft tissues.  IMPRESSION: 1.  Normal for age MRI appearance of the brain. 2.  Advanced cervical spine degenerative changes. 3.  MRA findings are below.  MRA HEAD WITHOUT CONTRAST  Technique: Angiographic images of the Circle of Willis were obtained using MRA technique without  intravenous contrast.  Findings: Antegrade flow in the posterior circulation.  Mildly dominant distal right vertebral artery.  Normal left PICA. Tortuous vertebrobasilar junction.  No basilar artery stenosis. Normal AICA vessels. Normal superior cerebellar artery and PCA origins.  Normal posterior communicating arteries.  Bilateral PCA branches are within normal limits.  Antegrade flow in both ICA siphons.  No ICA stenosis.  Ophthalmic and posterior communicating artery origins are within normal limits.  Normal carotid termini, MCA and ACA origins.  Diminutive or absent anterior communicating artery.  Visualized ACA branches are within normal limits.  Visualized  bilateral MCA branches are within normal limits.  IMPRESSION:  Negative intracranial MRA.  Original Report Authenticated By: Randall An, M.D.   Dg Hand 2 View Right  08/27/2011  *RADIOLOGY REPORT*  Clinical Data: Pain and swelling after fall.  RIGHT HAND - 2 VIEW  Comparison: None.  Findings: Degenerative changes involving the radial carpal, STT, first carpometacarpal, first metacarpal phalangeal, and multiple interphalangeal joints.  Subcortical cysts are present.  No definite erosive changes.  No evidence of acute fracture or subluxation.  IMPRESSION: Degenerative changes throughout the right hand and wrist.  No acute fractures demonstrated.  Original Report Authenticated By:  Neale Burly, M.D.   Dg Hand 2 View Left  08/27/2011  *RADIOLOGY REPORT*  Clinical Data: Pain and swelling in both hands.  Fall.  LEFT HAND - 2 VIEW  Comparison: None.  Findings: Degenerative changes involving the radial carpal, STT, first carpometacarpal, first metacarpal phalangeal, second metacarpal phalangeal, and multiple interphalangeal joints. Subcortical cystic changes in the proximal phalanx of the second finger.  No definite evidence of bone erosion.  No acute fractures or subluxations are appreciated.  IMPRESSION: Degenerative changes throughout the left hand and wrist.  No acute fractures demonstrated.  Original Report Authenticated By: Neale Burly, M.D.   Mr Mra Head/brain Wo Cm  08/27/2011  *RADIOLOGY REPORT*  Clinical Data:  78 year old female.  History of seizures. Transient ischemic attack.  Comparison: Head CT 08/25/2011.  MRI HEAD WITHOUT CONTRAST  Technique: Multiplanar, multiecho pulse sequences of the brain and surrounding structures were obtained according to standard protocol without intravenous contrast.  Findings: No restricted diffusion to suggest acute infarction.  No midline shift, mass effect, evidence of mass lesion, ventriculomegaly, extra-axial collection or acute intracranial  hemorrhage.  Cervicomedullary junction and pituitary are within normal limits.  Major intracranial vascular flow voids are preserved. Pearline Cables and white matter signal is within normal limits for age throughout the brain.  Multilevel degenerative changes in the cervical spine, advanced at C5-C6.  A degree of degenerative cervical spinal stenosis is suspected.  Outside of the degenerative marrow changes, bone marrow signal is within normal limits. Prominent but benign appearing vascular structure traverses the clivus.  Postoperative changes to the globes. Visualized paranasal sinuses and mastoids are clear. Negative scalp soft tissues.  IMPRESSION: 1.  Normal for age MRI appearance of the brain. 2.  Advanced cervical spine degenerative changes. 3.  MRA findings are below.  MRA HEAD WITHOUT CONTRAST  Technique: Angiographic images of the Circle of Willis were obtained using MRA technique without  intravenous contrast.  Findings: Antegrade flow in the posterior circulation.  Mildly dominant distal right vertebral artery.  Normal left PICA. Tortuous vertebrobasilar junction.  No basilar artery stenosis. Normal AICA vessels. Normal superior cerebellar artery and PCA origins.  Normal posterior communicating arteries.  Bilateral PCA branches are within normal limits.  Antegrade flow in both ICA siphons.  No ICA stenosis.  Ophthalmic and posterior communicating artery origins are within normal limits.  Normal carotid termini, MCA and ACA origins.  Diminutive or absent anterior communicating artery.  Visualized ACA branches are within normal limits.  Visualized bilateral MCA branches are within normal limits.  IMPRESSION:  Negative intracranial MRA.  Original Report Authenticated By: Randall An, M.D.    2D Echo  - Left ventricle: The cavity size was normal. There was mild focal basal hypertrophy of the septum. Systolic function was normal. The estimated ejection fraction was in the range of 55% to 60%. Wall motion was  normal; there were no regional wall motion abnormalities. Features are consistent with a pseudonormal left ventricular filling pattern, with concomitant abnormal relaxation and increased filling pressure (grade 2 diastolic dysfunction). - Mitral valve: Mild regurgitation. Impressions:  - Echolucent area (? fluid filled) on subcostal views most likely in liver; suggest CT or MR of the abdomen to better assess.     Admission HPI  HPI: 78 year-old female with known history of seizures for stenting, peripheral vascular disease status post left-sided femoral popliteal bypass, hyperparathyroidism and recent admission in January 2013 for ischemic colitis was at shopping mall when she was walking behind her granddaughter  she suddenly fell and started experiencing left-sided weakness both upper and lower extremity. She was brought to the ER as a code stroke. CT was negative for any acute and neurologist canceled the code as patient's symptoms are completely resolved by the time patient came in. Presently patient denies any focal deficits, did not have any difficulty seeing, swallowing, speaking. She has some pain in both her palms and difficult making a fist after the fall.   Physical Exam:  General: Comfortable, alert, communicative, fully oriented, not short of breath at rest.  HEENT: No clinical pallor, no jaundice, no conjunctival injection or discharge. Has obvious forehead bruising, and nasal/facial excoriations. Hydration status seems fair.  NECK: Supple, JVP not seen, no carotid bruits, no palpable lymphadenopathy, no palpable goiter.  CHEST: Clinically clear to auscultation, no wheezes, no crackles.  HEART: Sounds 1 and 2 heard, normal, regular, no murmurs.  ABDOMEN: Full, soft, non-tender, no palpable organomegaly, no palpable masses, normal bowel sounds.  LOWER EXTREMITIES: No pitting edema, palpable peripheral pulses. Has abrasion of left knee.  MUSCULOSKELETAL SYSTEM: Generalized  osteoarthritic changes, otherwise, normal.  CENTRAL NERVOUS SYSTEM: Patient has weakness of hand grip bilaterally, but obviously worse on left. Planter response is slightly up-going on left, otherwise, no focal neurologic deficit on gross examination  Hospital Course by problem list:  1. TIA/Possible small infarct not seen on MRI:  Patient presented with a left upper extremity weakness and Brain MRI shows normal for age appearance, Brain MRA is negative, carotid Doppler showed no significant extracranial carotid artery stenosis, and veterbrals are patent with antegrade flow. 2-D echocardiogram revealed normal left ventricular cavity size, mild focal basal hypertrophy of the septum, ejection fraction of 55% to 60% and no regional wall motion abnormalities. Neurology consultation was provided by Dr Alexis Goodell, and DR Antony Contras, saw patient on 08/27/11 and his impression was that patient possibly had a small infarct not seen on MRI versus neck injury during the fall. He recommended to continue the Plavix and aspirin she was on prior to admission. Patient had a Lipid profile which showed: TC 171, TG 62, HDL 70 and LDL89. She was maintained on a statin as is to continue upon discharge. PT OT was consulted and saw patient in the hospital and recommended home health PT OT which have been set up prior to discharge. He is to follow up with neurology outpatient.  2. Fall: This is secondary to #1, and is complicated by scalp hematoma, facial excoriations an d UE/LE bruising and left knee abrasion. Fortunately, there appear to be no bony injuries. The pain was managed with analgesics, with satisfactory results. PT OT also saw patient in the hospital as above. 3. Cervical spondylosis. Patient has bilateral hand weakness, albeit Lt>Rt. Cervical spine CT, reveals advanced cervical spondylosis. Likely, patient has some cervical spondylotic myelopathy.  Patient was unable to Lay flat and so Cervical spine MRI  cancelled, and per neurosurgeon-his index of suspicion that anything absolutely requiring surgery would've been found was very low and patient already stated that she would not want any surgery. As above PTOT followed her in the hospital and she'll have home health PT OT upon discharge.  4. CAD, status post stenting: Patient appears stable from this viewpoint. She did not any chest pain or dyspnea. She was maintained on her outpatient medications wishes to continue upon discharge.  5. Peripheral vascular disease status post left-sided femoro-popliteal bypass: Stable.  6. Hyperparathyroidism with hypercalcemia: This was diagnosed in 2003. Defer to primary MD-outpatient  followup..  7. Hypertension: The patient is to continue outpatient medications upon discharge. 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     Discharge Vitals:  BP 123/78  Pulse 67  Temp(Src) 98.2 F (36.8 C) (Oral)  Resp 18  Ht 5\' 1"  (1.549 m)  Wt 52.481 kg (115 lb 11.2 oz)  BMI 21.86 kg/m2  SpO2 95%  Discharge Labs: No results found for this or any previous visit (from the past 24 hour(s)).  SignedSheila Oats 08/29/2011, 10:38 AM

## 2011-08-29 NOTE — Progress Notes (Signed)
Upon d/c all health educations have given. Talked to the family (daughter) and they said home health has been already setup yesterday, they have the paper work with phone number. No concerns.

## 2011-08-29 NOTE — Progress Notes (Signed)
Occupational Therapy Treatment Patient Details Name: [name] MRN: [id number] DOB: 21-Apr-1925 Today's Date: 08/29/2011 Time: [id number] OT Time Calculation (min): 26 min  OT Assessment / Plan / Recommendation Comments on Treatment Session Pt progressing towards goals.  Demonstrating functional mobility at supervision level.      Follow Up Recommendations  Supervision/Assistance - 24 hour;Home health OT    Barriers to Discharge       Equipment Recommendations  None recommended by PT    Recommendations for Other Services    Frequency Min 2X/week   Plan Discharge plan remains appropriate    Precautions / Restrictions Precautions Precautions: Fall Restrictions Weight Bearing Restrictions: No   Pertinent Vitals/Pain N/A    ADL  Toilet Transfer: Simulated;Supervision/safety Toilet Transfer Method:  (ambulating) Toilet Transfer Equipment: Raised toilet seat with arms (or 3-in-1 over toilet) Tub/Shower Transfer: Performed;Supervision/safety Tub/Shower Transfer Method: Therapist, art: IT consultant Used: Gait belt;Rolling walker Transfers/Ambulation Related to ADLs: Pt ambulated to therapy gym with supervision and RW. ADL Comments: Pt educated on safe tub transfer and use of tub bench at home. Pt has tub bench at home but has never used it.  Pt able to return demonstration with supervision for safety. Educated pt on home safety techniques.    OT Diagnosis:    OT Problem List:   OT Treatment Interventions:     OT Goals ADL Goals Pt Will Perform Tub/Shower Transfer: Tub transfer;with modified independence;Ambulation;with DME;Transfer tub bench ADL Goal: Tub/Shower Transfer - Progress: Progressing toward goals Miscellaneous OT Goals Miscellaneous OT Goal #1: Pt will be able to perform 3/3 toileting tasks with mod I. OT Goal: Miscellaneous Goal #1 - Progress: Progressing toward goals  Visit Information  Last OT Received On:  08/29/11 Assistance Needed: +1 PT/OT Co-Evaluation/Treatment: Yes    Subjective Data      Prior Functioning       Cognition  Overall Cognitive Status: Appears within functional limits for tasks assessed/performed Arousal/Alertness: Awake/alert Orientation Level: Appears intact for tasks assessed Behavior During Session: [facility] for tasks performed    Mobility Bed Mobility Bed Mobility: Not assessed (pt sitting EOB upon arrival) Transfers Transfers: Sit to Stand;Stand to Sit Sit to Stand: 5: Supervision;From bed;Other (comment);From chair/3-in-1;With upper extremity assist (from tub bench) Stand to Sit: 5: Supervision;To bed;To chair/3-in-1;With armrests;With upper extremity assist (to tub bench) Details for Transfer Assistance: verbal cueing for safe hand placement   Exercises    Balance    End of Session OT - End of Session Equipment Utilized During Treatment: Gait belt Activity Tolerance: Patient tolerated treatment well Patient left: in chair;with call bell/phone within reach Nurse Communication: Mobility status  08/29/2011 Darrol Jump OTR/L Pager [phone] Office [phone]   Darrol Jump 08/29/2011, 10:47 AM

## 2011-08-30 DIAGNOSIS — I69959 Hemiplegia and hemiparesis following unspecified cerebrovascular disease affecting unspecified side: Secondary | ICD-10-CM | POA: Diagnosis not present

## 2011-08-30 DIAGNOSIS — F329 Major depressive disorder, single episode, unspecified: Secondary | ICD-10-CM | POA: Diagnosis not present

## 2011-08-30 DIAGNOSIS — IMO0001 Reserved for inherently not codable concepts without codable children: Secondary | ICD-10-CM | POA: Diagnosis not present

## 2011-08-30 DIAGNOSIS — M48061 Spinal stenosis, lumbar region without neurogenic claudication: Secondary | ICD-10-CM | POA: Diagnosis not present

## 2011-08-30 DIAGNOSIS — IMO0002 Reserved for concepts with insufficient information to code with codable children: Secondary | ICD-10-CM | POA: Diagnosis not present

## 2011-09-02 DIAGNOSIS — IMO0001 Reserved for inherently not codable concepts without codable children: Secondary | ICD-10-CM | POA: Diagnosis not present

## 2011-09-02 DIAGNOSIS — I69959 Hemiplegia and hemiparesis following unspecified cerebrovascular disease affecting unspecified side: Secondary | ICD-10-CM | POA: Diagnosis not present

## 2011-09-02 DIAGNOSIS — IMO0002 Reserved for concepts with insufficient information to code with codable children: Secondary | ICD-10-CM | POA: Diagnosis not present

## 2011-09-02 DIAGNOSIS — F329 Major depressive disorder, single episode, unspecified: Secondary | ICD-10-CM | POA: Diagnosis not present

## 2011-09-02 DIAGNOSIS — M48061 Spinal stenosis, lumbar region without neurogenic claudication: Secondary | ICD-10-CM | POA: Diagnosis not present

## 2011-09-03 DIAGNOSIS — IMO0001 Reserved for inherently not codable concepts without codable children: Secondary | ICD-10-CM | POA: Diagnosis not present

## 2011-09-03 DIAGNOSIS — I69959 Hemiplegia and hemiparesis following unspecified cerebrovascular disease affecting unspecified side: Secondary | ICD-10-CM | POA: Diagnosis not present

## 2011-09-03 DIAGNOSIS — M48061 Spinal stenosis, lumbar region without neurogenic claudication: Secondary | ICD-10-CM | POA: Diagnosis not present

## 2011-09-03 DIAGNOSIS — F329 Major depressive disorder, single episode, unspecified: Secondary | ICD-10-CM | POA: Diagnosis not present

## 2011-09-03 DIAGNOSIS — IMO0002 Reserved for concepts with insufficient information to code with codable children: Secondary | ICD-10-CM | POA: Diagnosis not present

## 2011-09-04 DIAGNOSIS — G459 Transient cerebral ischemic attack, unspecified: Secondary | ICD-10-CM | POA: Diagnosis not present

## 2011-09-05 DIAGNOSIS — I69959 Hemiplegia and hemiparesis following unspecified cerebrovascular disease affecting unspecified side: Secondary | ICD-10-CM | POA: Diagnosis not present

## 2011-09-05 DIAGNOSIS — IMO0001 Reserved for inherently not codable concepts without codable children: Secondary | ICD-10-CM | POA: Diagnosis not present

## 2011-09-05 DIAGNOSIS — M48061 Spinal stenosis, lumbar region without neurogenic claudication: Secondary | ICD-10-CM | POA: Diagnosis not present

## 2011-09-05 DIAGNOSIS — IMO0002 Reserved for concepts with insufficient information to code with codable children: Secondary | ICD-10-CM | POA: Diagnosis not present

## 2011-09-05 DIAGNOSIS — F329 Major depressive disorder, single episode, unspecified: Secondary | ICD-10-CM | POA: Diagnosis not present

## 2011-09-06 DIAGNOSIS — M48061 Spinal stenosis, lumbar region without neurogenic claudication: Secondary | ICD-10-CM | POA: Diagnosis not present

## 2011-09-06 DIAGNOSIS — F329 Major depressive disorder, single episode, unspecified: Secondary | ICD-10-CM | POA: Diagnosis not present

## 2011-09-06 DIAGNOSIS — I69959 Hemiplegia and hemiparesis following unspecified cerebrovascular disease affecting unspecified side: Secondary | ICD-10-CM | POA: Diagnosis not present

## 2011-09-06 DIAGNOSIS — IMO0001 Reserved for inherently not codable concepts without codable children: Secondary | ICD-10-CM | POA: Diagnosis not present

## 2011-09-06 DIAGNOSIS — IMO0002 Reserved for concepts with insufficient information to code with codable children: Secondary | ICD-10-CM | POA: Diagnosis not present

## 2011-09-10 DIAGNOSIS — IMO0002 Reserved for concepts with insufficient information to code with codable children: Secondary | ICD-10-CM | POA: Diagnosis not present

## 2011-09-10 DIAGNOSIS — M48061 Spinal stenosis, lumbar region without neurogenic claudication: Secondary | ICD-10-CM | POA: Diagnosis not present

## 2011-09-10 DIAGNOSIS — I69959 Hemiplegia and hemiparesis following unspecified cerebrovascular disease affecting unspecified side: Secondary | ICD-10-CM | POA: Diagnosis not present

## 2011-09-10 DIAGNOSIS — F329 Major depressive disorder, single episode, unspecified: Secondary | ICD-10-CM | POA: Diagnosis not present

## 2011-09-10 DIAGNOSIS — IMO0001 Reserved for inherently not codable concepts without codable children: Secondary | ICD-10-CM | POA: Diagnosis not present

## 2011-09-11 ENCOUNTER — Other Ambulatory Visit: Payer: Self-pay | Admitting: Cardiology

## 2011-09-11 DIAGNOSIS — I69959 Hemiplegia and hemiparesis following unspecified cerebrovascular disease affecting unspecified side: Secondary | ICD-10-CM | POA: Diagnosis not present

## 2011-09-11 DIAGNOSIS — N183 Chronic kidney disease, stage 3 unspecified: Secondary | ICD-10-CM | POA: Diagnosis not present

## 2011-09-11 DIAGNOSIS — IMO0002 Reserved for concepts with insufficient information to code with codable children: Secondary | ICD-10-CM | POA: Diagnosis not present

## 2011-09-11 DIAGNOSIS — I739 Peripheral vascular disease, unspecified: Secondary | ICD-10-CM | POA: Diagnosis not present

## 2011-09-11 DIAGNOSIS — IMO0001 Reserved for inherently not codable concepts without codable children: Secondary | ICD-10-CM | POA: Diagnosis not present

## 2011-09-11 DIAGNOSIS — F329 Major depressive disorder, single episode, unspecified: Secondary | ICD-10-CM | POA: Diagnosis not present

## 2011-09-11 DIAGNOSIS — G459 Transient cerebral ischemic attack, unspecified: Secondary | ICD-10-CM | POA: Diagnosis not present

## 2011-09-11 DIAGNOSIS — I10 Essential (primary) hypertension: Secondary | ICD-10-CM | POA: Diagnosis not present

## 2011-09-11 DIAGNOSIS — M48061 Spinal stenosis, lumbar region without neurogenic claudication: Secondary | ICD-10-CM | POA: Diagnosis not present

## 2011-09-12 ENCOUNTER — Encounter: Payer: Self-pay | Admitting: Physician Assistant

## 2011-09-12 ENCOUNTER — Ambulatory Visit (INDEPENDENT_AMBULATORY_CARE_PROVIDER_SITE_OTHER): Payer: Medicare Other | Admitting: Physician Assistant

## 2011-09-12 VITALS — BP 126/66 | HR 60 | Ht 62.0 in | Wt 112.0 lb

## 2011-09-12 DIAGNOSIS — I2510 Atherosclerotic heart disease of native coronary artery without angina pectoris: Secondary | ICD-10-CM

## 2011-09-12 DIAGNOSIS — I10 Essential (primary) hypertension: Secondary | ICD-10-CM

## 2011-09-12 DIAGNOSIS — G459 Transient cerebral ischemic attack, unspecified: Secondary | ICD-10-CM | POA: Diagnosis not present

## 2011-09-12 DIAGNOSIS — E782 Mixed hyperlipidemia: Secondary | ICD-10-CM | POA: Diagnosis not present

## 2011-09-12 NOTE — Telephone Encounter (Signed)
..   Requested Prescriptions   Pending Prescriptions Disp Refills  . metoprolol succinate (TOPROL-XL) 50 MG 24 hr tablet [Pharmacy Med Name: METOPROLOL ER 50MG   TAB] 30 tablet 11    Sig: TAKE ONE TABLET BY MOUTH EVERY DAY

## 2011-09-12 NOTE — Patient Instructions (Signed)
Your physician recommends that you schedule a follow-up appointment in: APPROX 6 WEEKS WITH DR. [facility]  Your physician has recommended that you wear an event monitor DX TIA. Event monitors are medical devices that record the heart's electrical activity. Doctors most often [place] these monitors to diagnose arrhythmias. Arrhythmias are problems with the speed or rhythm of the heartbeat. The monitor is a small, portable device. You can wear one while you do your normal daily activities. This is usually used to diagnose what is causing palpitations/syncope (passing out).

## 2011-09-12 NOTE — Progress Notes (Signed)
[place], [place]  [address] Phone: [phone] Fax:  [phone]  Date:  09/12/2011   Name:  [name]   DOB:  1925/05/10   MRN:  [id number]  PCP:  Lillard Anes, MD, MD  Primary Cardiologist:  Dr. Minus Breeding  Primary Electrophysiologist:  None    History of Present Illness: [name] is a 78 y.o. female who returns for post hospital follow up.  She has a history of CAD, status post stenting to the RCA x2, hypertension, hyperlipidemia, peripheral arterial disease, status post left femoral-pop bypass graft, hyperparathyroidism, left renal artery stenosis 40%, hypothyroidism, ischemic colitis.  Last LHC 04/2009 in the setting of a NSTEMI: EF 65%, RCA stent patent with 60% ISR proximal and 40% ISR mid, PLV proximal 50-60% and distal 50%, ostial circumflex 30-40%, proximal OM mild in-stent restenosis with a branch with 90+ percent ostial stenosis, proximal LAD 30-40%.  FFR of ostial circumflex:  0.96 - not flow limiting.  Medical therapy was continued.    Patient was admitted 08/25/11-08/29/11  with left upper extremity weakness thought to possibly represent a TIA versus possible small infarct that was not visualized on MRI.  Echocardiogram 08/26/11: Mild focal basal septal hypertrophy, EF 0000000, grade 2 diastolic dysfunction, mild MR.  There was a fluid-filled area likely in the location of the liver noted.  According to the discharge notes this was reviewed with radiology.  This was felt to represent a stable, previously known, liver cysts.  She is doing well since d/c.  Notes weakness, but is improving.  Walking with a walker.  She is being seen by HHPT/HHOT.  She denies chest pain, significant dyspnea, orthopnea, PND.  She has chronic left ankle edema without change.   Denies palpitations.  No syncope.   Wt Readings from Last 3 Encounters:  09/12/11 112 lb (50.803 kg)  08/26/11 115 lb 11.2 oz (52.481 kg)  06/20/11 111 lb (50.349 kg)       Potassium  Date/Time Value Range Status  08/28/2011  5:55 AM 3.9  3.5-5.1 (mEq/L) Final     Creatinine, Ser  Date/Time Value Range Status  08/28/2011  5:55 AM 0.81  0.50-1.10 (mg/dL) Final     ALT  Date/Time Value Range Status  08/27/2011  5:55 AM 15  0-35 (U/L) Final     TSH  Date/Time Value Range Status  08/26/2011  6:25 AM 0.245* 0.350-4.500 (uIU/mL) Final     Hemoglobin  Date/Time Value Range Status  08/27/2011  5:55 AM 11.8* 12.0-15.0 (g/dL) Final    Past Medical History  Diagnosis Date  . CAD (coronary artery disease) 2003, 2010    s/p stenting of RCA x2, s/p stent to OM;  LHC 04/2009 in the setting of a NSTEMI: EF 65%, RCA stent patent with 60% ISR proximal and 40% ISR mid, PLV proximal 50-60% and distal 50%, ostial circumflex 30-40%, proximal OM mild in-stent restenosis with a branch with 90+ percent ostial stenosis, proximal LAD 30-40%.  FFR of ostial circumflex:  0.96 - not flow limiting.  Medical therapy was continued.  . Hyperlipidemia   . HTN (hypertension)     Echocardiogram 08/26/11: Mild focal basal septal hypertrophy, EF 0000000, grade 2 diastolic dysfunction, mild MR.  There was a fluid-filled area likely in the location of the liver noted  . PVD (peripheral vascular disease)     s/p left fem-pop bypass graft  . Hyperparathyroidism 2003    parathyroid adenoma localized to the right inferior  thyroid lobe region  . Renal artery stenosis     left, 40%  . Osteoporosis 2009    diagnosed by bone density scan in 2009, on Vit D only, no bisphosphonate therapy  . Bacteremia 2003    hx of staph bacteremia  . Degenerative disc disease   . Lumbar stenosis with neurogenic claudication 2006    s/p decompression L2-L5  . Abscess 2003    I&D SCM abscess  . Arthritis   . History of medication noncompliance     concerning Plavix  . Depression   . Phlebitis     LLE  . Hypothyroidism   . Acute ischemic colitis 04/10/11    acute episode of ischemic colitis, with abd  pain, CT evidence of transverse colitis, and blood streaked BM.  Resolved with conservative treatment.  Marland Kitchen TIA (transient ischemic attack)     carotid dopplers 5/13: no ICA stenosis    Current Outpatient Prescriptions  Medication Sig Dispense Refill  . aspirin 325 MG tablet Take 325 mg by mouth daily.        . bimatoprost (LUMIGAN) 0.03 % ophthalmic drops Apply 1 drop to eye at bedtime.       . brimonidine (ALPHAGAN) 0.15 % ophthalmic solution Place 1 drop into the right eye 2 (two) times daily.  5 mL  0  . clopidogrel (PLAVIX) 75 MG tablet Take 75 mg by mouth daily.      . Coenzyme Q10 (CO Q-10) 50 MG CAPS Take 1 capsule by mouth daily.        . Cyanocobalamin (VITAMIN B-12) 5000 MCG SUBL Place 1 tablet under the tongue daily.        . metoprolol succinate (TOPROL-XL) 50 MG 24 hr tablet TAKE ONE TABLET BY MOUTH EVERY DAY  30 tablet  11  . Multiple Vitamins-Minerals (HAIR/SKIN/NAILS PO) Take 1 tablet by mouth daily.        . nitroGLYCERIN (NITROSTAT) 0.4 MG SL tablet Place 0.4 mg under the tongue every 5 (five) minutes as needed. For chest pain      . pravastatin (PRAVACHOL) 40 MG tablet Take 40 mg by mouth daily.      . ramipril (ALTACE) 10 MG capsule Take 10 mg by mouth daily.      . traMADol (ULTRAM) 50 MG tablet Take 1-2 tablets (50-100 mg total) by mouth 2 (two) times daily. Take 2 tablets every morning and take 1 tablet every evening  30 tablet  0  . DISCONTD: simvastatin (ZOCOR) 40 MG tablet Take 1 tablet (40 mg total) by mouth at bedtime.  30 tablet  0    Allergies: Allergies  Allergen Reactions  . Diphenhydramine Hcl Other (See Comments)    unknown  . Penicillins Other (See Comments)    unknown  . Shellfish Allergy Other (See Comments)    unknown    History  Substance Use Topics  . Smoking status: Never Smoker   . Smokeless tobacco: Never Used  . Alcohol Use: No     PHYSICAL EXAM: VS:  BP 126/66  Pulse 60  Ht 5\' 2"  (1.575 m)  Wt 112 lb (50.803 kg)  BMI 20.49  kg/m2 Well nourished, well developed, in no acute distress HEENT: normal Neck: no JVD Endocrine: no thyromegaly  Cardiac:  normal S1, S2; RRR; no murmur Lungs:  clear to auscultation bilaterally, no wheezing, rhonchi or rales Abd: soft, nontender, no hepatomegaly Ext: no edema Skin: warm and dry Neuro:  CNs 2-12 intact, no focal abnormalities noted  EKG:  NSR, HR 60, leftward axis, PRWP, no ischemic changes    ASSESSMENT AND PLAN:  1.  TIA Echo was ok and no ICA stenosis on carotid dopplers. MRA was also negative. Etiology not clear. She has follow up with neurology in July. I will arrange an event monitor to rule out occult AFib. Follow up with Dr. Minus Breeding in 6 weeks.  2.  Coronary Artery Disease Continue ASA and Plavix. No angina.  3.  Hyperlipidemia Continue Pravastatin.  4.  Hypertension Controlled.  Continue current therapy.    Danton Sewer, PA-C  12:39 PM 09/12/2011

## 2011-09-13 ENCOUNTER — Encounter (INDEPENDENT_AMBULATORY_CARE_PROVIDER_SITE_OTHER): Payer: Medicare Other

## 2011-09-13 DIAGNOSIS — G459 Transient cerebral ischemic attack, unspecified: Secondary | ICD-10-CM

## 2011-09-13 DIAGNOSIS — I498 Other specified cardiac arrhythmias: Secondary | ICD-10-CM

## 2011-09-13 DIAGNOSIS — I2510 Atherosclerotic heart disease of native coronary artery without angina pectoris: Secondary | ICD-10-CM

## 2011-09-13 DIAGNOSIS — I4589 Other specified conduction disorders: Secondary | ICD-10-CM | POA: Diagnosis not present

## 2011-09-16 DIAGNOSIS — IMO0002 Reserved for concepts with insufficient information to code with codable children: Secondary | ICD-10-CM | POA: Diagnosis not present

## 2011-09-16 DIAGNOSIS — I69959 Hemiplegia and hemiparesis following unspecified cerebrovascular disease affecting unspecified side: Secondary | ICD-10-CM | POA: Diagnosis not present

## 2011-09-16 DIAGNOSIS — M48061 Spinal stenosis, lumbar region without neurogenic claudication: Secondary | ICD-10-CM | POA: Diagnosis not present

## 2011-09-16 DIAGNOSIS — F329 Major depressive disorder, single episode, unspecified: Secondary | ICD-10-CM | POA: Diagnosis not present

## 2011-09-16 DIAGNOSIS — IMO0001 Reserved for inherently not codable concepts without codable children: Secondary | ICD-10-CM | POA: Diagnosis not present

## 2011-09-18 DIAGNOSIS — I69959 Hemiplegia and hemiparesis following unspecified cerebrovascular disease affecting unspecified side: Secondary | ICD-10-CM | POA: Diagnosis not present

## 2011-09-18 DIAGNOSIS — F329 Major depressive disorder, single episode, unspecified: Secondary | ICD-10-CM | POA: Diagnosis not present

## 2011-09-18 DIAGNOSIS — M48061 Spinal stenosis, lumbar region without neurogenic claudication: Secondary | ICD-10-CM | POA: Diagnosis not present

## 2011-09-18 DIAGNOSIS — IMO0001 Reserved for inherently not codable concepts without codable children: Secondary | ICD-10-CM | POA: Diagnosis not present

## 2011-09-18 DIAGNOSIS — IMO0002 Reserved for concepts with insufficient information to code with codable children: Secondary | ICD-10-CM | POA: Diagnosis not present

## 2011-09-19 DIAGNOSIS — M48061 Spinal stenosis, lumbar region without neurogenic claudication: Secondary | ICD-10-CM | POA: Diagnosis not present

## 2011-09-19 DIAGNOSIS — IMO0002 Reserved for concepts with insufficient information to code with codable children: Secondary | ICD-10-CM | POA: Diagnosis not present

## 2011-09-19 DIAGNOSIS — IMO0001 Reserved for inherently not codable concepts without codable children: Secondary | ICD-10-CM | POA: Diagnosis not present

## 2011-09-19 DIAGNOSIS — I69959 Hemiplegia and hemiparesis following unspecified cerebrovascular disease affecting unspecified side: Secondary | ICD-10-CM | POA: Diagnosis not present

## 2011-09-19 DIAGNOSIS — F329 Major depressive disorder, single episode, unspecified: Secondary | ICD-10-CM | POA: Diagnosis not present

## 2011-09-23 DIAGNOSIS — M48061 Spinal stenosis, lumbar region without neurogenic claudication: Secondary | ICD-10-CM | POA: Diagnosis not present

## 2011-09-23 DIAGNOSIS — I69959 Hemiplegia and hemiparesis following unspecified cerebrovascular disease affecting unspecified side: Secondary | ICD-10-CM | POA: Diagnosis not present

## 2011-09-23 DIAGNOSIS — IMO0001 Reserved for inherently not codable concepts without codable children: Secondary | ICD-10-CM | POA: Diagnosis not present

## 2011-09-23 DIAGNOSIS — IMO0002 Reserved for concepts with insufficient information to code with codable children: Secondary | ICD-10-CM | POA: Diagnosis not present

## 2011-09-23 DIAGNOSIS — F329 Major depressive disorder, single episode, unspecified: Secondary | ICD-10-CM | POA: Diagnosis not present

## 2011-09-26 DIAGNOSIS — F329 Major depressive disorder, single episode, unspecified: Secondary | ICD-10-CM | POA: Diagnosis not present

## 2011-09-26 DIAGNOSIS — I69959 Hemiplegia and hemiparesis following unspecified cerebrovascular disease affecting unspecified side: Secondary | ICD-10-CM | POA: Diagnosis not present

## 2011-09-26 DIAGNOSIS — M48061 Spinal stenosis, lumbar region without neurogenic claudication: Secondary | ICD-10-CM | POA: Diagnosis not present

## 2011-09-26 DIAGNOSIS — IMO0002 Reserved for concepts with insufficient information to code with codable children: Secondary | ICD-10-CM | POA: Diagnosis not present

## 2011-09-26 DIAGNOSIS — IMO0001 Reserved for inherently not codable concepts without codable children: Secondary | ICD-10-CM | POA: Diagnosis not present

## 2011-09-30 DIAGNOSIS — IMO0001 Reserved for inherently not codable concepts without codable children: Secondary | ICD-10-CM | POA: Diagnosis not present

## 2011-09-30 DIAGNOSIS — I69959 Hemiplegia and hemiparesis following unspecified cerebrovascular disease affecting unspecified side: Secondary | ICD-10-CM | POA: Diagnosis not present

## 2011-09-30 DIAGNOSIS — M48061 Spinal stenosis, lumbar region without neurogenic claudication: Secondary | ICD-10-CM | POA: Diagnosis not present

## 2011-09-30 DIAGNOSIS — IMO0002 Reserved for concepts with insufficient information to code with codable children: Secondary | ICD-10-CM | POA: Diagnosis not present

## 2011-09-30 DIAGNOSIS — F329 Major depressive disorder, single episode, unspecified: Secondary | ICD-10-CM | POA: Diagnosis not present

## 2011-10-03 DIAGNOSIS — IMO0002 Reserved for concepts with insufficient information to code with codable children: Secondary | ICD-10-CM | POA: Diagnosis not present

## 2011-10-03 DIAGNOSIS — IMO0001 Reserved for inherently not codable concepts without codable children: Secondary | ICD-10-CM | POA: Diagnosis not present

## 2011-10-03 DIAGNOSIS — M48061 Spinal stenosis, lumbar region without neurogenic claudication: Secondary | ICD-10-CM | POA: Diagnosis not present

## 2011-10-03 DIAGNOSIS — I69959 Hemiplegia and hemiparesis following unspecified cerebrovascular disease affecting unspecified side: Secondary | ICD-10-CM | POA: Diagnosis not present

## 2011-10-03 DIAGNOSIS — F329 Major depressive disorder, single episode, unspecified: Secondary | ICD-10-CM | POA: Diagnosis not present

## 2011-10-07 DIAGNOSIS — I69959 Hemiplegia and hemiparesis following unspecified cerebrovascular disease affecting unspecified side: Secondary | ICD-10-CM | POA: Diagnosis not present

## 2011-10-07 DIAGNOSIS — M48061 Spinal stenosis, lumbar region without neurogenic claudication: Secondary | ICD-10-CM | POA: Diagnosis not present

## 2011-10-07 DIAGNOSIS — IMO0002 Reserved for concepts with insufficient information to code with codable children: Secondary | ICD-10-CM | POA: Diagnosis not present

## 2011-10-07 DIAGNOSIS — IMO0001 Reserved for inherently not codable concepts without codable children: Secondary | ICD-10-CM | POA: Diagnosis not present

## 2011-10-07 DIAGNOSIS — F329 Major depressive disorder, single episode, unspecified: Secondary | ICD-10-CM | POA: Diagnosis not present

## 2011-10-08 DIAGNOSIS — I719 Aortic aneurysm of unspecified site, without rupture: Secondary | ICD-10-CM | POA: Diagnosis not present

## 2011-10-08 DIAGNOSIS — G459 Transient cerebral ischemic attack, unspecified: Secondary | ICD-10-CM | POA: Diagnosis not present

## 2011-10-08 DIAGNOSIS — N183 Chronic kidney disease, stage 3 unspecified: Secondary | ICD-10-CM | POA: Diagnosis not present

## 2011-10-08 DIAGNOSIS — I10 Essential (primary) hypertension: Secondary | ICD-10-CM | POA: Diagnosis not present

## 2011-10-09 ENCOUNTER — Other Ambulatory Visit (HOSPITAL_COMMUNITY): Payer: Self-pay | Admitting: *Deleted

## 2011-10-09 DIAGNOSIS — I714 Abdominal aortic aneurysm, without rupture: Secondary | ICD-10-CM

## 2011-10-10 DIAGNOSIS — M48061 Spinal stenosis, lumbar region without neurogenic claudication: Secondary | ICD-10-CM | POA: Diagnosis not present

## 2011-10-10 DIAGNOSIS — F329 Major depressive disorder, single episode, unspecified: Secondary | ICD-10-CM | POA: Diagnosis not present

## 2011-10-10 DIAGNOSIS — IMO0002 Reserved for concepts with insufficient information to code with codable children: Secondary | ICD-10-CM | POA: Diagnosis not present

## 2011-10-10 DIAGNOSIS — IMO0001 Reserved for inherently not codable concepts without codable children: Secondary | ICD-10-CM | POA: Diagnosis not present

## 2011-10-10 DIAGNOSIS — I69959 Hemiplegia and hemiparesis following unspecified cerebrovascular disease affecting unspecified side: Secondary | ICD-10-CM | POA: Diagnosis not present

## 2011-10-15 ENCOUNTER — Ambulatory Visit (INDEPENDENT_AMBULATORY_CARE_PROVIDER_SITE_OTHER): Payer: Medicare Other | Admitting: Cardiology

## 2011-10-15 ENCOUNTER — Ambulatory Visit (HOSPITAL_COMMUNITY): Payer: Medicare Other

## 2011-10-15 ENCOUNTER — Encounter: Payer: Self-pay | Admitting: Cardiology

## 2011-10-15 VITALS — BP 119/65 | HR 60 | Ht 63.0 in | Wt 110.4 lb

## 2011-10-15 DIAGNOSIS — I2510 Atherosclerotic heart disease of native coronary artery without angina pectoris: Secondary | ICD-10-CM

## 2011-10-15 DIAGNOSIS — I739 Peripheral vascular disease, unspecified: Secondary | ICD-10-CM | POA: Diagnosis not present

## 2011-10-15 DIAGNOSIS — I10 Essential (primary) hypertension: Secondary | ICD-10-CM | POA: Diagnosis not present

## 2011-10-15 NOTE — Patient Instructions (Addendum)
The current medical regimen is effective;  continue present plan and medications.  Your physician has requested that you have an abdominal aorta duplex. During this test, an ultrasound is used to evaluate the aorta. Allow 30 minutes for this exam. Do not eat after midnight the day before and avoid carbonated beverages  Follow up in 1 year with Dr Percival Spanish.  You will receive a letter in the mail 2 months before you are due.  Please call us when you receive this letter to schedule your follow up appointment.

## 2011-10-15 NOTE — Assessment & Plan Note (Signed)
This is being managed in the context of treating his CHF

## 2011-10-15 NOTE — Progress Notes (Signed)
HPI The patient presents for followup of known coronary disease. She was hospitalized earlier this year with weakness and a possible TIA. She has been seen once in followup in this clinic by Mr. Kathlen Mody and she was doing well. Since that appointment she said she's been doing okay. She has no new complaints. The patient denies any new symptoms such as chest discomfort, neck or arm discomfort. There has been no new shortness of breath, PND or orthopnea. There have been no reported palpitations, presyncope or syncope.   Allergies  Allergen Reactions  . Diphenhydramine Hcl Other (See Comments)    unknown  . Penicillins Other (See Comments)    unknown  . Shellfish Allergy Other (See Comments)    unknown    Current Outpatient Prescriptions  Medication Sig Dispense Refill  . aspirin 325 MG tablet Take 325 mg by mouth daily.        . bimatoprost (LUMIGAN) 0.03 % ophthalmic drops Apply 1 drop to eye at bedtime.       . brimonidine (ALPHAGAN) 0.15 % ophthalmic solution Place 1 drop into the right eye 2 (two) times daily.  5 mL  0  . clopidogrel (PLAVIX) 75 MG tablet Take 75 mg by mouth daily.      . Coenzyme Q10 (CO Q-10) 50 MG CAPS Take 1 capsule by mouth daily.        . Cyanocobalamin (VITAMIN B-12) 5000 MCG SUBL Place 1 tablet under the tongue daily.        . metoprolol succinate (TOPROL-XL) 50 MG 24 hr tablet TAKE ONE TABLET BY MOUTH EVERY DAY  30 tablet  11  . nitroGLYCERIN (NITROSTAT) 0.4 MG SL tablet Place 0.4 mg under the tongue every 5 (five) minutes as needed. For chest pain      . pravastatin (PRAVACHOL) 40 MG tablet Take 80 mg by mouth daily.       . ramipril (ALTACE) 10 MG capsule Take 10 mg by mouth daily.      . traMADol (ULTRAM) 50 MG tablet Take 1-2 tablets (50-100 mg total) by mouth 2 (two) times daily. Take 2 tablets every morning and take 1 tablet every evening  30 tablet  0  . DISCONTD: simvastatin (ZOCOR) 40 MG tablet Take 1 tablet (40 mg total) by mouth at bedtime.  30  tablet  0    Past Medical History  Diagnosis Date  . CAD (coronary artery disease) 2003, 2010    s/p stenting of RCA x2, s/p stent to OM;  LHC 04/2009 in the setting of a NSTEMI: EF 65%, RCA stent patent with 60% ISR proximal and 40% ISR mid, PLV proximal 50-60% and distal 50%, ostial circumflex 30-40%, proximal OM mild in-stent restenosis with a branch with 90+ percent ostial stenosis, proximal LAD 30-40%.  FFR of ostial circumflex:  0.96 - not flow limiting.  Medical therapy was continued.  . Hyperlipidemia   . HTN (hypertension)     Echocardiogram 08/26/11: Mild focal basal septal hypertrophy, EF 0000000, grade 2 diastolic dysfunction, mild MR.  There was a fluid-filled area likely in the location of the liver noted  . PVD (peripheral vascular disease)     s/p left fem-pop bypass graft  . Hyperparathyroidism 2003    parathyroid adenoma localized to the right inferior thyroid lobe region  . Renal artery stenosis     left, 40%  . Osteoporosis 2009    diagnosed by bone density scan in 2009, on Vit D only, no bisphosphonate therapy  .  Bacteremia 2003    hx of staph bacteremia  . Degenerative disc disease   . Lumbar stenosis with neurogenic claudication 2006    s/p decompression L2-L5  . Abscess 2003    I&D SCM abscess  . Arthritis   . History of medication noncompliance     concerning Plavix  . Depression   . Phlebitis     LLE  . Hypothyroidism   . Acute ischemic colitis 04/10/11    acute episode of ischemic colitis, with abd pain, CT evidence of transverse colitis, and blood streaked BM.  Resolved with conservative treatment.  Marland Kitchen TIA (transient ischemic attack)     carotid dopplers 5/13: no ICA stenosis    Past Surgical History  Procedure Date  . Appendectomy   . Femoral bypass before 2003    LLE  . Incision and drainage anterior neck 10/2001    absces sternoclavicular joint  . Coronary angioplasty with stent placement 09/2008  . Coronary angioplasty with stent placement  04/2001  . Back surgery 06/2004    L3, L4 lami; L2, L5 hemilami; decompresion L2-3, 3-4, 4-5    ROS:  As stated in the HPI and negative for all other systems, except for back pain  PHYSICAL EXAM BP 119/65  Pulse 60  Ht 5\' 3"  (1.6 m)  Wt 110 lb 6.4 oz (50.077 kg)  BMI 19.56 kg/m2 GENERAL:  Well appearing HEENT:  Pupils equal round and reactive, fundi not visualized, oral mucosa unremarkable, denutres NECK:  No jugular venous distention, waveform within normal limits, carotid upstroke brisk and symmetric, no bruits, no thyromegaly LYMPHATICS:  No cervical, inguinal adenopathy LUNGS:  Clear to auscultation bilaterally BACK:  No CVA tenderness CHEST:  Unremarkable HEART:  PMI not displaced or sustained,S1 and S2 within normal limits, no S3, no S4, no clicks, no rubs, no murmurs ABD:  Flat, positive bowel sounds normal in frequency in pitch, no bruits, no rebound, no guarding, no midline pulsatile mass, no hepatomegaly, no splenomegaly EXT:  2 plus pulses upper, decreased PT/DP bilateral, no edema, no cyanosis no clubbing   ASSESSMENT AND PLAN

## 2011-10-15 NOTE — Assessment & Plan Note (Signed)
She did mention to me having some kind of an imaging study. She was told it was an abdominal aneurysm but I don't see a record of this. I will order an abdominal ultrasound.

## 2011-10-15 NOTE — Assessment & Plan Note (Signed)
>>  ASSESSMENT AND PLAN FOR HYPERTENSION WRITTEN ON 10/15/2011 12:51 PM BY Minus Breeding, MD  This is being managed in the context of treating his CHF

## 2011-10-15 NOTE — Assessment & Plan Note (Signed)
The patient has no new sypmtoms.  No further cardiovascular testing is indicated.  We will continue with aggressive risk reduction and meds as listed.  

## 2011-10-16 ENCOUNTER — Encounter (INDEPENDENT_AMBULATORY_CARE_PROVIDER_SITE_OTHER): Payer: Medicare Other

## 2011-10-16 DIAGNOSIS — I739 Peripheral vascular disease, unspecified: Secondary | ICD-10-CM

## 2011-10-16 DIAGNOSIS — I700 Atherosclerosis of aorta: Secondary | ICD-10-CM | POA: Diagnosis not present

## 2011-11-01 DIAGNOSIS — I6350 Cerebral infarction due to unspecified occlusion or stenosis of unspecified cerebral artery: Secondary | ICD-10-CM | POA: Diagnosis not present

## 2011-11-19 DIAGNOSIS — H4011X0 Primary open-angle glaucoma, stage unspecified: Secondary | ICD-10-CM | POA: Diagnosis not present

## 2011-11-27 DIAGNOSIS — G459 Transient cerebral ischemic attack, unspecified: Secondary | ICD-10-CM | POA: Diagnosis not present

## 2011-11-27 DIAGNOSIS — N183 Chronic kidney disease, stage 3 unspecified: Secondary | ICD-10-CM | POA: Diagnosis not present

## 2011-11-27 DIAGNOSIS — E782 Mixed hyperlipidemia: Secondary | ICD-10-CM | POA: Diagnosis not present

## 2011-11-27 DIAGNOSIS — I10 Essential (primary) hypertension: Secondary | ICD-10-CM | POA: Diagnosis not present

## 2012-01-25 ENCOUNTER — Other Ambulatory Visit: Payer: Self-pay | Admitting: Cardiology

## 2012-01-27 NOTE — Telephone Encounter (Signed)
..   Requested Prescriptions   Pending Prescriptions Disp Refills  . ramipril (ALTACE) 10 MG capsule [Pharmacy Med Name: RAMIPRIL 10MG        CAP] 30 capsule 5    Sig: TAKE ONE CAPSULE BY MOUTH EVERY DAY

## 2012-03-10 DIAGNOSIS — H4011X0 Primary open-angle glaucoma, stage unspecified: Secondary | ICD-10-CM | POA: Diagnosis not present

## 2012-03-11 DIAGNOSIS — I10 Essential (primary) hypertension: Secondary | ICD-10-CM | POA: Diagnosis not present

## 2012-03-11 DIAGNOSIS — E782 Mixed hyperlipidemia: Secondary | ICD-10-CM | POA: Diagnosis not present

## 2012-03-11 DIAGNOSIS — N183 Chronic kidney disease, stage 3 unspecified: Secondary | ICD-10-CM | POA: Diagnosis not present

## 2012-03-11 DIAGNOSIS — G459 Transient cerebral ischemic attack, unspecified: Secondary | ICD-10-CM | POA: Diagnosis not present

## 2012-03-31 ENCOUNTER — Other Ambulatory Visit: Payer: Self-pay | Admitting: Cardiology

## 2012-04-06 DIAGNOSIS — H4011X0 Primary open-angle glaucoma, stage unspecified: Secondary | ICD-10-CM | POA: Diagnosis not present

## 2012-05-07 DIAGNOSIS — H4011X0 Primary open-angle glaucoma, stage unspecified: Secondary | ICD-10-CM | POA: Diagnosis not present

## 2012-06-23 DIAGNOSIS — N183 Chronic kidney disease, stage 3 unspecified: Secondary | ICD-10-CM | POA: Diagnosis not present

## 2012-06-23 DIAGNOSIS — G459 Transient cerebral ischemic attack, unspecified: Secondary | ICD-10-CM | POA: Diagnosis not present

## 2012-06-23 DIAGNOSIS — E782 Mixed hyperlipidemia: Secondary | ICD-10-CM | POA: Diagnosis not present

## 2012-06-23 DIAGNOSIS — I10 Essential (primary) hypertension: Secondary | ICD-10-CM | POA: Diagnosis not present

## 2012-07-17 DIAGNOSIS — H4011X0 Primary open-angle glaucoma, stage unspecified: Secondary | ICD-10-CM | POA: Diagnosis not present

## 2012-08-14 DIAGNOSIS — H4011X0 Primary open-angle glaucoma, stage unspecified: Secondary | ICD-10-CM | POA: Diagnosis not present

## 2012-08-21 ENCOUNTER — Ambulatory Visit (INDEPENDENT_AMBULATORY_CARE_PROVIDER_SITE_OTHER): Payer: Medicare Other | Admitting: Cardiology

## 2012-08-21 ENCOUNTER — Encounter: Payer: Self-pay | Admitting: Cardiology

## 2012-08-21 VITALS — BP 134/80 | HR 63 | Ht 63.0 in | Wt 107.0 lb

## 2012-08-21 DIAGNOSIS — I2510 Atherosclerotic heart disease of native coronary artery without angina pectoris: Secondary | ICD-10-CM | POA: Diagnosis not present

## 2012-08-21 DIAGNOSIS — I739 Peripheral vascular disease, unspecified: Secondary | ICD-10-CM | POA: Diagnosis not present

## 2012-08-21 DIAGNOSIS — I10 Essential (primary) hypertension: Secondary | ICD-10-CM | POA: Diagnosis not present

## 2012-08-21 DIAGNOSIS — Z1231 Encounter for screening mammogram for malignant neoplasm of breast: Secondary | ICD-10-CM | POA: Diagnosis not present

## 2012-08-21 MED ORDER — ASPIRIN 81 MG PO TABS: 81.0000 mg | ORAL_TABLET | Freq: Every day | ORAL | Status: DC

## 2012-08-21 NOTE — Progress Notes (Signed)
HPI The patient presents for followup of known coronary disease. Since that appointment she said she's been doing okay. She has no new complaints. She does have aches and pains but she gets around, lives independently and does her own chores. The patient denies any new symptoms such as chest discomfort, neck or arm discomfort. There has been no new shortness of breath, PND or orthopnea. There have been no reported palpitations, presyncope or syncope.   Allergies  Allergen Reactions  . Diphenhydramine Hcl Other (See Comments)    unknown  . Penicillins Other (See Comments)    unknown  . Shellfish Allergy Other (See Comments)    unknown    Current Outpatient Prescriptions  Medication Sig Dispense Refill  . aspirin 325 MG tablet Take 325 mg by mouth daily.        . bimatoprost (LUMIGAN) 0.03 % ophthalmic drops Apply 1 drop to eye at bedtime.       . brimonidine (ALPHAGAN) 0.15 % ophthalmic solution Place 1 drop into the right eye 2 (two) times daily.  5 mL  0  . Coenzyme Q10 (CO Q-10) 50 MG CAPS Take 1 capsule by mouth daily.        . Cyanocobalamin (VITAMIN B-12) 5000 MCG SUBL Place 1 tablet under the tongue daily.        . metoprolol succinate (TOPROL-XL) 50 MG 24 hr tablet TAKE ONE TABLET BY MOUTH EVERY DAY  30 tablet  11  . nitroGLYCERIN (NITROSTAT) 0.4 MG SL tablet Place 0.4 mg under the tongue every 5 (five) minutes as needed. For chest pain      . pravastatin (PRAVACHOL) 40 MG tablet Take 80 mg by mouth daily.       . ramipril (ALTACE) 10 MG capsule Take 10 mg by mouth daily.      . traMADol (ULTRAM) 50 MG tablet Take 1-2 tablets (50-100 mg total) by mouth 2 (two) times daily. Take 2 tablets every morning and take 1 tablet every evening  30 tablet  0  . clopidogrel (PLAVIX) 75 MG tablet Take 1 tablet (75 mg total) by mouth daily.  30 tablet  11  . [DISCONTINUED] simvastatin (ZOCOR) 40 MG tablet Take 1 tablet (40 mg total) by mouth at bedtime.  30 tablet  0   No current  facility-administered medications for this visit.    Past Medical History  Diagnosis Date  . CAD (coronary artery disease) 2003, 2010    s/p stenting of RCA x2, s/p stent to OM;  LHC 04/2009 in the setting of a NSTEMI: EF 65%, RCA stent patent with 60% ISR proximal and 40% ISR mid, PLV proximal 50-60% and distal 50%, ostial circumflex 30-40%, proximal OM mild in-stent restenosis with a branch with 90+ percent ostial stenosis, proximal LAD 30-40%.  FFR of ostial circumflex:  0.96 - not flow limiting.  Medical therapy was continued.  . Hyperlipidemia   . HTN (hypertension)     Echocardiogram 08/26/11: Mild focal basal septal hypertrophy, EF 0000000, grade 2 diastolic dysfunction, mild MR.  There was a fluid-filled area likely in the location of the liver noted  . PVD (peripheral vascular disease)     s/p left fem-pop bypass graft  . Hyperparathyroidism 2003    parathyroid adenoma localized to the right inferior thyroid lobe region  . Renal artery stenosis     left, 40%  . Osteoporosis 2009    diagnosed by bone density scan in 2009, on Vit D only, no bisphosphonate therapy  .  Bacteremia 2003    hx of staph bacteremia  . Degenerative disc disease   . Lumbar stenosis with neurogenic claudication 2006    s/p decompression L2-L5  . Abscess 2003    I&D SCM abscess  . Arthritis   . History of medication noncompliance     concerning Plavix  . Depression   . Phlebitis     LLE  . Hypothyroidism   . Acute ischemic colitis 04/10/11    acute episode of ischemic colitis, with abd pain, CT evidence of transverse colitis, and blood streaked BM.  Resolved with conservative treatment.  Marland Kitchen TIA (transient ischemic attack)     carotid dopplers 5/13: no ICA stenosis    Past Surgical History  Procedure Laterality Date  . Appendectomy    . Femoral bypass  before 2003    LLE  . Incision and drainage anterior neck  10/2001    absces sternoclavicular joint  . Coronary angioplasty with stent placement   09/2008  . Coronary angioplasty with stent placement  04/2001  . Back surgery  06/2004    L3, L4 lami; L2, L5 hemilami; decompresion L2-3, 3-4, 4-5    ROS:  As stated in the HPI and negative for all other systems, except for back pain  PHYSICAL EXAM BP 134/80  Pulse 63  Ht 5\' 3"  (1.6 m)  Wt 107 lb (48.535 kg)  BMI 18.96 kg/m2 GENERAL:  Well appearing, small HEENT:  Pupils equal round and reactive, fundi not visualized, oral mucosa unremarkable, denutres NECK:  No jugular venous distention, waveform within normal limits, carotid upstroke brisk and symmetric, no bruits, no thyromegaly LUNGS:  Clear to auscultation bilaterally BACK:  No CVA tenderness, Mild lordosis. CHEST:  Unremarkable HEART:  PMI not displaced or sustained,S1 and S2 within normal limits, no S3, no S4, no clicks, no rubs, no murmurs ABD:  Flat, positive bowel sounds normal in frequency in pitch, no bruits, no rebound, no guarding, no midline pulsatile mass, no hepatomegaly, no splenomegaly EXT:  2 plus pulses upper, decreased PT/DP bilateral, no edema, no cyanosis no clubbing  Sinus rhythm, rate 63, axis within normal limits, intervals within normal limits, no acute ST-T wave changes.  08/21/2012   ASSESSMENT AND PLAN  CAD:  The patient has no new sypmtoms.  No further cardiovascular testing is indicated.  She can reduce to 81 mg ASA.  I will continue the Plavix given her residual coronary disease symptoms previously and need for aggressive medical management.  HTN:  The blood pressure is at target. No change in medications is indicated. We will continue with therapeutic lifestyle changes (TLC).

## 2012-08-21 NOTE — Patient Instructions (Addendum)
Please stop pravastatin.  Take Asprin 81 mg a day.  Continue all other medications as listed.  Follow up in 1 year with Dr Percival Spanish.  You will receive a letter in the mail 2 months before you are due.  Please call us when you receive this letter to schedule your follow up appointment.

## 2012-08-24 DIAGNOSIS — H4011X0 Primary open-angle glaucoma, stage unspecified: Secondary | ICD-10-CM | POA: Diagnosis not present

## 2012-09-14 ENCOUNTER — Other Ambulatory Visit: Payer: Self-pay

## 2012-09-14 MED ORDER — NITROGLYCERIN 0.4 MG SL SUBL: 0.4000 mg | SUBLINGUAL_TABLET | SUBLINGUAL | Status: DC | PRN

## 2012-09-14 NOTE — Telephone Encounter (Signed)
..   Requested Prescriptions   Signed Prescriptions Disp Refills  . nitroGLYCERIN (NITROSTAT) 0.4 MG SL tablet 25 tablet 2    Sig: Place 1 tablet (0.4 mg total) under the tongue every 5 (five) minutes as needed. For chest pain    Authorizing Provider: Minus Breeding    Ordering User: Ardis Hughs, Brodee Mauritz Jerilynn Mages

## 2012-09-30 ENCOUNTER — Other Ambulatory Visit: Payer: Self-pay | Admitting: Cardiology

## 2012-10-27 DIAGNOSIS — H4011X0 Primary open-angle glaucoma, stage unspecified: Secondary | ICD-10-CM | POA: Diagnosis not present

## 2012-11-25 DIAGNOSIS — N183 Chronic kidney disease, stage 3 unspecified: Secondary | ICD-10-CM | POA: Diagnosis not present

## 2012-11-25 DIAGNOSIS — E782 Mixed hyperlipidemia: Secondary | ICD-10-CM | POA: Diagnosis not present

## 2012-11-25 DIAGNOSIS — G459 Transient cerebral ischemic attack, unspecified: Secondary | ICD-10-CM | POA: Diagnosis not present

## 2012-11-25 DIAGNOSIS — I10 Essential (primary) hypertension: Secondary | ICD-10-CM | POA: Diagnosis not present

## 2013-01-28 DIAGNOSIS — H4011X0 Primary open-angle glaucoma, stage unspecified: Secondary | ICD-10-CM | POA: Diagnosis not present

## 2013-02-09 DIAGNOSIS — Z0000 Encounter for general adult medical examination without abnormal findings: Secondary | ICD-10-CM | POA: Diagnosis not present

## 2013-02-09 DIAGNOSIS — Z23 Encounter for immunization: Secondary | ICD-10-CM | POA: Diagnosis not present

## 2013-02-12 DIAGNOSIS — H4011X0 Primary open-angle glaucoma, stage unspecified: Secondary | ICD-10-CM | POA: Diagnosis not present

## 2013-03-02 DIAGNOSIS — H4011X0 Primary open-angle glaucoma, stage unspecified: Secondary | ICD-10-CM | POA: Diagnosis not present

## 2013-04-02 DIAGNOSIS — H4011X0 Primary open-angle glaucoma, stage unspecified: Secondary | ICD-10-CM | POA: Diagnosis not present

## 2013-04-20 DIAGNOSIS — H4011X0 Primary open-angle glaucoma, stage unspecified: Secondary | ICD-10-CM | POA: Diagnosis not present

## 2013-05-12 DIAGNOSIS — J069 Acute upper respiratory infection, unspecified: Secondary | ICD-10-CM | POA: Diagnosis not present

## 2013-06-18 DIAGNOSIS — H4011X0 Primary open-angle glaucoma, stage unspecified: Secondary | ICD-10-CM | POA: Diagnosis not present

## 2013-06-28 DIAGNOSIS — H6120 Impacted cerumen, unspecified ear: Secondary | ICD-10-CM | POA: Diagnosis not present

## 2013-07-01 ENCOUNTER — Other Ambulatory Visit: Payer: Self-pay | Admitting: Legal Medicine

## 2013-07-01 DIAGNOSIS — I10 Essential (primary) hypertension: Secondary | ICD-10-CM | POA: Diagnosis not present

## 2013-07-01 DIAGNOSIS — N183 Chronic kidney disease, stage 3 unspecified: Secondary | ICD-10-CM | POA: Diagnosis not present

## 2013-07-01 DIAGNOSIS — E785 Hyperlipidemia, unspecified: Secondary | ICD-10-CM | POA: Diagnosis not present

## 2013-07-01 DIAGNOSIS — M810 Age-related osteoporosis without current pathological fracture: Secondary | ICD-10-CM

## 2013-07-02 ENCOUNTER — Other Ambulatory Visit: Payer: Self-pay | Admitting: Legal Medicine

## 2013-07-02 DIAGNOSIS — M8580 Other specified disorders of bone density and structure, unspecified site: Secondary | ICD-10-CM

## 2013-07-12 ENCOUNTER — Ambulatory Visit
Admission: RE | Admit: 2013-07-12 | Discharge: 2013-07-12 | Disposition: A | Discharge status: Home / Self Care | Payer: Medicare Other | Source: Ambulatory Visit | Attending: Legal Medicine | Admitting: Legal Medicine

## 2013-07-12 DIAGNOSIS — M8580 Other specified disorders of bone density and structure, unspecified site: Secondary | ICD-10-CM

## 2013-07-12 DIAGNOSIS — M810 Age-related osteoporosis without current pathological fracture: Secondary | ICD-10-CM | POA: Diagnosis not present

## 2013-07-26 DIAGNOSIS — M810 Age-related osteoporosis without current pathological fracture: Secondary | ICD-10-CM | POA: Diagnosis not present

## 2013-09-22 DIAGNOSIS — H4011X0 Primary open-angle glaucoma, stage unspecified: Secondary | ICD-10-CM | POA: Diagnosis not present

## 2013-10-28 DIAGNOSIS — E785 Hyperlipidemia, unspecified: Secondary | ICD-10-CM | POA: Diagnosis not present

## 2013-10-28 DIAGNOSIS — I10 Essential (primary) hypertension: Secondary | ICD-10-CM | POA: Diagnosis not present

## 2013-10-28 DIAGNOSIS — M810 Age-related osteoporosis without current pathological fracture: Secondary | ICD-10-CM | POA: Diagnosis not present

## 2013-10-28 DIAGNOSIS — N183 Chronic kidney disease, stage 3 unspecified: Secondary | ICD-10-CM | POA: Diagnosis not present

## 2013-10-28 DIAGNOSIS — IMO0002 Reserved for concepts with insufficient information to code with codable children: Secondary | ICD-10-CM | POA: Diagnosis not present

## 2013-10-29 ENCOUNTER — Telehealth: Payer: Self-pay | Admitting: Cardiology

## 2013-10-29 NOTE — Telephone Encounter (Signed)
New Message   Pt [place] daughter called requests a sooner appt with Dr. Hochrein// Declined PA //Pt is having chest pains and feeling weak.. Please call back to discuss.

## 2013-11-01 NOTE — Telephone Encounter (Signed)
Pt.s grandaughter called lmom to call me back

## 2013-11-02 DIAGNOSIS — Z1231 Encounter for screening mammogram for malignant neoplasm of breast: Secondary | ICD-10-CM | POA: Diagnosis not present

## 2013-11-09 ENCOUNTER — Encounter: Payer: Self-pay | Admitting: Cardiology

## 2013-11-09 ENCOUNTER — Ambulatory Visit (INDEPENDENT_AMBULATORY_CARE_PROVIDER_SITE_OTHER): Payer: Medicare Other | Admitting: Cardiology

## 2013-11-09 VITALS — BP 104/60 | HR 57 | Ht 63.0 in | Wt 106.1 lb

## 2013-11-09 DIAGNOSIS — I2510 Atherosclerotic heart disease of native coronary artery without angina pectoris: Secondary | ICD-10-CM | POA: Diagnosis not present

## 2013-11-09 DIAGNOSIS — I10 Essential (primary) hypertension: Secondary | ICD-10-CM

## 2013-11-09 NOTE — Patient Instructions (Signed)
Your physician recommends that you schedule a follow-up appointment in: 18 months with Dr. Hochrein  

## 2013-11-09 NOTE — Telephone Encounter (Signed)
Pt. To be seen 11/09/2013

## 2013-11-09 NOTE — Progress Notes (Signed)
HPI The patient presents for followup of known coronary disease. Since that appointment she said she's been doing okay. She still lives by herself and does her own chores.  She has no new complaints. She does have aches and pains but she gets around, lives independently and does her own chores.   she has no chest discomfort, neck or arm discomfort. There has been no new shortness of breath, PND or orthopnea. There have been no reported palpitations, presyncope or syncope.   Allergies  Allergen Reactions  . Diphenhydramine Hcl Other (See Comments)    unknown  . Penicillins Other (See Comments)    unknown  . Shellfish Allergy Other (See Comments)    unknown    Current Outpatient Prescriptions  Medication Sig Dispense Refill  . alendronate (FOSAMAX) 70 MG tablet Take 70 mg by mouth once a week. Take with a full glass of water on an empty stomach.      Marland Kitchen aspirin 81 MG tablet Take 1 tablet (81 mg total) by mouth daily.      . bimatoprost (LUMIGAN) 0.03 % ophthalmic drops Apply 1 drop to eye at bedtime.       . brimonidine (ALPHAGAN) 0.15 % ophthalmic solution Place 1 drop into the right eye 2 (two) times daily.  5 mL  0  . clopidogrel (PLAVIX) 75 MG tablet Take 75 mg by mouth daily.      . Coenzyme Q10 (CO Q-10) 50 MG CAPS Take 1 capsule by mouth daily.        . Cyanocobalamin (VITAMIN B-12) 5000 MCG SUBL Place 1 tablet under the tongue daily.        . metoprolol succinate (TOPROL-XL) 50 MG 24 hr tablet TAKE ONE TABLET BY MOUTH EVERY DAY  30 tablet  6  . nitroGLYCERIN (NITROSTAT) 0.4 MG SL tablet Place 1 tablet (0.4 mg total) under the tongue every 5 (five) minutes as needed. For chest pain  25 tablet  2  . ramipril (ALTACE) 10 MG capsule TAKE ONE CAPSULE BY MOUTH EVERY DAY  30 capsule  6  . traMADol (ULTRAM) 50 MG tablet Take 1-2 tablets (50-100 mg total) by mouth 2 (two) times daily. Take 2 tablets every morning and take 1 tablet every evening  30 tablet  0  . [DISCONTINUED] simvastatin  (ZOCOR) 40 MG tablet Take 1 tablet (40 mg total) by mouth at bedtime.  30 tablet  0   No current facility-administered medications for this visit.    Past Medical History  Diagnosis Date  . CAD (coronary artery disease) 2003, 2010    s/p stenting of RCA x2, s/p stent to OM;  LHC 04/2009 in the setting of a NSTEMI: EF 65%, RCA stent patent with 60% ISR proximal and 40% ISR mid, PLV proximal 50-60% and distal 50%, ostial circumflex 30-40%, proximal OM mild in-stent restenosis with a branch with 90+ percent ostial stenosis, proximal LAD 30-40%.  FFR of ostial circumflex:  0.96 - not flow limiting.  Medical therapy was continued.  . Hyperlipidemia   . HTN (hypertension)     Echocardiogram 08/26/11: Mild focal basal septal hypertrophy, EF 0000000, grade 2 diastolic dysfunction, mild MR.  There was a fluid-filled area likely in the location of the liver noted  . PVD (peripheral vascular disease)     s/p left fem-pop bypass graft  . Hyperparathyroidism 2003    parathyroid adenoma localized to the right inferior thyroid lobe region  . Renal artery stenosis     left,  40%  . Osteoporosis 2009    diagnosed by bone density scan in 2009, on Vit D only, no bisphosphonate therapy  . Bacteremia 2003    hx of staph bacteremia  . Degenerative disc disease   . Lumbar stenosis with neurogenic claudication 2006    s/p decompression L2-L5  . Abscess 2003    I&D SCM abscess  . Arthritis   . History of medication noncompliance     concerning Plavix  . Depression   . Phlebitis     LLE  . Hypothyroidism   . Acute ischemic colitis 04/10/11    acute episode of ischemic colitis, with abd pain, CT evidence of transverse colitis, and blood streaked BM.  Resolved with conservative treatment.  Marland Kitchen TIA (transient ischemic attack)     carotid dopplers 5/13: no ICA stenosis    Past Surgical History  Procedure Laterality Date  . Appendectomy    . Femoral bypass  before 2003    LLE  . Incision and drainage  anterior neck  10/2001    absces sternoclavicular joint  . Coronary angioplasty with stent placement  09/2008  . Coronary angioplasty with stent placement  04/2001  . Back surgery  06/2004    L3, L4 lami; L2, L5 hemilami; decompresion L2-3, 3-4, 4-5    ROS:  Numbness of her left heal.  Otherwise as stated in the HPI and negative for all other systems, except for back pain  PHYSICAL EXAM BP 104/60  Pulse 57  Ht 5\' 3"  (1.6 m)  Wt 106 lb 1.6 oz (48.127 kg)  BMI 18.80 kg/m2 GENERAL:  Well appearing, small HEENT:  Pupils equal round and reactive, fundi not visualized, oral mucosa unremarkable, denutres NECK:  No jugular venous distention, waveform within normal limits, carotid upstroke brisk and symmetric, no bruits, no thyromegaly LUNGS:  Clear to auscultation bilaterally BACK:  No CVA tenderness, Mild lordosis. CHEST:  Unremarkable HEART:  PMI not displaced or sustained,S1 and S2 within normal limits, no S3, no S4, no clicks, no rubs, no murmurs ABD:  Flat, positive bowel sounds normal in frequency in pitch, no bruits, no rebound, no guarding, no midline pulsatile mass, no hepatomegaly, no splenomegaly EXT:  2 plus pulses upper, decreased PT/DP bilateral, no edema, no cyanosis no clubbing  EKG:  Sinus rhythm, rate 57, axis within normal limits, intervals within normal limits, no acute ST-T wave changes.  11/09/2013   ASSESSMENT AND PLAN  CAD:  The patient has no new sypmtoms.  No further cardiovascular testing is indicated.  I will continue the Plavix given her residual coronary disease symptoms previously and need for aggressive medical management.  HTN:  The blood pressure is at target. No change in medications is indicated. We will continue with therapeutic lifestyle changes (TLC).  DYSLIPIDEMIA:  She does not take the Zocor and statins cause muscle aches.  She will remain off of these.

## 2014-01-04 DIAGNOSIS — H4011X0 Primary open-angle glaucoma, stage unspecified: Secondary | ICD-10-CM | POA: Diagnosis not present

## 2014-01-14 DIAGNOSIS — H4011X1 Primary open-angle glaucoma, mild stage: Secondary | ICD-10-CM | POA: Diagnosis not present

## 2014-03-10 DIAGNOSIS — R110 Nausea: Secondary | ICD-10-CM | POA: Diagnosis not present

## 2014-03-10 DIAGNOSIS — K2970 Gastritis, unspecified, without bleeding: Secondary | ICD-10-CM | POA: Diagnosis not present

## 2014-03-11 ENCOUNTER — Emergency Department (HOSPITAL_COMMUNITY): Payer: Medicare Other

## 2014-03-11 ENCOUNTER — Encounter (HOSPITAL_COMMUNITY): Payer: Self-pay | Admitting: *Deleted

## 2014-03-11 ENCOUNTER — Emergency Department (HOSPITAL_COMMUNITY)
Admission: EM | Admit: 2014-03-11 | Discharge: 2014-03-11 | Disposition: A | Discharge status: Home / Self Care | Payer: Medicare Other | Attending: Emergency Medicine | Admitting: Emergency Medicine

## 2014-03-11 DIAGNOSIS — E785 Hyperlipidemia, unspecified: Secondary | ICD-10-CM | POA: Diagnosis not present

## 2014-03-11 DIAGNOSIS — F329 Major depressive disorder, single episode, unspecified: Secondary | ICD-10-CM | POA: Insufficient documentation

## 2014-03-11 DIAGNOSIS — R1032 Left lower quadrant pain: Secondary | ICD-10-CM | POA: Diagnosis not present

## 2014-03-11 DIAGNOSIS — K868 Other specified diseases of pancreas: Secondary | ICD-10-CM | POA: Diagnosis not present

## 2014-03-11 DIAGNOSIS — I10 Essential (primary) hypertension: Secondary | ICD-10-CM | POA: Insufficient documentation

## 2014-03-11 DIAGNOSIS — R531 Weakness: Secondary | ICD-10-CM | POA: Insufficient documentation

## 2014-03-11 DIAGNOSIS — Z8673 Personal history of transient ischemic attack (TIA), and cerebral infarction without residual deficits: Secondary | ICD-10-CM | POA: Diagnosis not present

## 2014-03-11 DIAGNOSIS — Z880 Allergy status to penicillin: Secondary | ICD-10-CM | POA: Insufficient documentation

## 2014-03-11 DIAGNOSIS — K862 Cyst of pancreas: Secondary | ICD-10-CM | POA: Diagnosis not present

## 2014-03-11 DIAGNOSIS — Z7982 Long term (current) use of aspirin: Secondary | ICD-10-CM | POA: Insufficient documentation

## 2014-03-11 DIAGNOSIS — Z9089 Acquired absence of other organs: Secondary | ICD-10-CM | POA: Insufficient documentation

## 2014-03-11 DIAGNOSIS — R197 Diarrhea, unspecified: Secondary | ICD-10-CM | POA: Diagnosis not present

## 2014-03-11 DIAGNOSIS — R195 Other fecal abnormalities: Secondary | ICD-10-CM | POA: Diagnosis not present

## 2014-03-11 DIAGNOSIS — I2510 Atherosclerotic heart disease of native coronary artery without angina pectoris: Secondary | ICD-10-CM | POA: Diagnosis not present

## 2014-03-11 DIAGNOSIS — Z9119 Patient's noncompliance with other medical treatment and regimen: Secondary | ICD-10-CM | POA: Diagnosis not present

## 2014-03-11 DIAGNOSIS — Z79899 Other long term (current) drug therapy: Secondary | ICD-10-CM | POA: Diagnosis not present

## 2014-03-11 DIAGNOSIS — M1990 Unspecified osteoarthritis, unspecified site: Secondary | ICD-10-CM | POA: Insufficient documentation

## 2014-03-11 DIAGNOSIS — K921 Melena: Secondary | ICD-10-CM | POA: Diagnosis not present

## 2014-03-11 DIAGNOSIS — K8689 Other specified diseases of pancreas: Secondary | ICD-10-CM

## 2014-03-11 LAB — COMPREHENSIVE METABOLIC PANEL
ALT: 14 U/L (ref 0–35)
AST: 25 U/L (ref 0–37)
Albumin: 3.4 g/dL — ABNORMAL LOW (ref 3.5–5.2)
Alkaline Phosphatase: 100 U/L (ref 39–117)
Anion gap: 10 (ref 5–15)
BUN: 21 mg/dL (ref 6–23)
CALCIUM: 11.7 mg/dL — AB (ref 8.4–10.5)
CO2: 23 meq/L (ref 19–32)
CREATININE: 0.86 mg/dL (ref 0.50–1.10)
Chloride: 110 mEq/L (ref 96–112)
GFR, EST AFRICAN AMERICAN: 69 mL/min — AB (ref >90)
GFR, EST NON AFRICAN AMERICAN: 59 mL/min — AB (ref >90)
GLUCOSE: 138 mg/dL — AB (ref 70–99)
Potassium: 4.6 mEq/L (ref 3.7–5.3)
Sodium: 143 mEq/L (ref 137–147)
Total Bilirubin: <0.2 mg/dL — ABNORMAL LOW (ref 0.3–1.2)
Total Protein: 6.8 g/dL (ref 6.0–8.3)

## 2014-03-11 LAB — CBC WITH DIFFERENTIAL/PLATELET
Basophils Absolute: 0 10*3/uL (ref 0.0–0.1)
Basophils Relative: 0 % (ref 0–1)
EOS PCT: 2 % (ref 0–5)
Eosinophils Absolute: 0.2 10*3/uL (ref 0.0–0.7)
HEMATOCRIT: 36 % (ref 36.0–46.0)
Hemoglobin: 11.5 g/dL — ABNORMAL LOW (ref 12.0–15.0)
LYMPHS ABS: 1.6 10*3/uL (ref 0.7–4.0)
LYMPHS PCT: 20 % (ref 12–46)
MCH: 31.4 pg (ref 26.0–34.0)
MCHC: 31.9 g/dL (ref 30.0–36.0)
MCV: 98.4 fL (ref 78.0–100.0)
MONO ABS: 0.5 10*3/uL (ref 0.1–1.0)
Monocytes Relative: 7 % (ref 3–12)
Neutro Abs: 5.7 10*3/uL (ref 1.7–7.7)
Neutrophils Relative %: 71 % (ref 43–77)
Platelets: 165 10*3/uL (ref 150–400)
RBC: 3.66 MIL/uL — AB (ref 3.87–5.11)
RDW: 12.6 % (ref 11.5–15.5)
WBC: 8 10*3/uL (ref 4.0–10.5)

## 2014-03-11 LAB — URINALYSIS, ROUTINE W REFLEX MICROSCOPIC
Bilirubin Urine: NEGATIVE
GLUCOSE, UA: NEGATIVE mg/dL
Hgb urine dipstick: NEGATIVE
KETONES UR: NEGATIVE mg/dL
LEUKOCYTES UA: NEGATIVE
Nitrite: NEGATIVE
PROTEIN: NEGATIVE mg/dL
Specific Gravity, Urine: 1.005 (ref 1.005–1.030)
Urobilinogen, UA: 0.2 mg/dL (ref 0.0–1.0)
pH: 6.5 (ref 5.0–8.0)

## 2014-03-11 LAB — LIPASE, BLOOD: LIPASE: 12 U/L (ref 11–59)

## 2014-03-11 LAB — POC OCCULT BLOOD, ED: Fecal Occult Bld: POSITIVE — AB

## 2014-03-11 LAB — LACTIC ACID, PLASMA: LACTIC ACID, VENOUS: 2 mmol/L (ref 0.5–2.2)

## 2014-03-11 MED ORDER — IOHEXOL 300 MG/ML  SOLN
80.0000 mL | Freq: Once | INTRAMUSCULAR | Status: AC | PRN
  Administered 2014-03-11: 80 mL via INTRAVENOUS

## 2014-03-11 MED ORDER — SODIUM CHLORIDE 0.9 % IV BOLUS (SEPSIS)
500.0000 mL | Freq: Once | INTRAVENOUS | Status: AC
  Administered 2014-03-11: 500 mL via INTRAVENOUS

## 2014-03-11 MED ORDER — IOHEXOL 300 MG/ML  SOLN
50.0000 mL | Freq: Once | INTRAMUSCULAR | Status: AC | PRN
  Administered 2014-03-11: 50 mL via ORAL

## 2014-03-11 MED ORDER — FENTANYL CITRATE 0.05 MG/ML IJ SOLN
50.0000 ug | Freq: Once | INTRAMUSCULAR | Status: AC
  Administered 2014-03-11: 50 ug via INTRAVENOUS
  Filled 2014-03-11: qty 2

## 2014-03-11 NOTE — ED Provider Notes (Signed)
CSN: [id number]     Arrival date & time 03/11/14  0019 History  This chart was scribed for Pamella Pert, MD by Rayfield Citizen, ED Scribe. This patient was seen in room D32C/D32C and the patient's care was started at 12:41 AM.     Chief Complaint  Patient presents with  . Abdominal Pain   Patient is a 80 y.o. female presenting with abdominal pain. The history is provided by the patient and a relative. No language interpreter was used.  Abdominal Pain Pain location:  LLQ and suprapubic Pain quality: sharp   Pain radiates to:  Does not radiate Pain severity:  Moderate Onset quality:  Sudden Duration:  2 hours Timing:  Intermittent Progression:  Partially resolved Chronicity:  Recurrent Context: not diet changes   Relieved by:  Bowel activity Worsened by:  Nothing tried Ineffective treatments:  None tried Associated symptoms: diarrhea   Associated symptoms: no chest pain, no chills, no cough, no dysuria, no fever, no hematuria, no nausea, no sore throat and no vomiting   Diarrhea:    Quality:  Watery   Number of occurrences:  2   Severity:  Mild   Duration:  2 hours   Timing:  Intermittent   Progression:  Partially resolved    HPI Comments: [name] is a 80 y.o. female with past medical history of colitis (2012) presents to the Emergency Department complaining of lower abdominal pain beginning tonight around 2230. She had watery, brown diarrhea (2x) after the onset of pain and felt weak; she reports that she could not walk on her own. She states that her pain is much improved; it was rated 10/10 at worst and is 5/10 at present. She denies nausea, vomiting, chest pain, SOB, or any other symptoms. She denies dietary changes; she did not eat a large meal tonight. Patient took emetrol for her symptoms.   Past Medical History  Diagnosis Date  . CAD (coronary artery disease) 2003, 2010    s/p stenting of RCA x2, s/p stent to OM;  LHC 04/2009 in the setting of a NSTEMI: EF 65%, RCA  stent patent with 60% ISR proximal and 40% ISR mid, PLV proximal 50-60% and distal 50%, ostial circumflex 30-40%, proximal OM mild in-stent restenosis with a branch with 90+ percent ostial stenosis, proximal LAD 30-40%.  FFR of ostial circumflex:  0.96 - not flow limiting.  Medical therapy was continued.  . Hyperlipidemia   . HTN (hypertension)     Echocardiogram 08/26/11: Mild focal basal septal hypertrophy, EF 0000000, grade 2 diastolic dysfunction, mild MR.  There was a fluid-filled area likely in the location of the liver noted  . PVD (peripheral vascular disease)     s/p left fem-pop bypass graft  . Hyperparathyroidism 2003    parathyroid adenoma localized to the right inferior thyroid lobe region  . Renal artery stenosis     left, 40%  . Osteoporosis 2009    diagnosed by bone density scan in 2009, on Vit D only, no bisphosphonate therapy  . Bacteremia 2003    hx of staph bacteremia  . Degenerative disc disease   . Lumbar stenosis with neurogenic claudication 2006    s/p decompression L2-L5  . Abscess 2003    I&D SCM abscess  . Arthritis   . History of medication noncompliance     concerning Plavix  . Depression   . Phlebitis     LLE  . Hypothyroidism   . Acute ischemic colitis 04/10/11  acute episode of ischemic colitis, with abd pain, CT evidence of transverse colitis, and blood streaked BM.  Resolved with conservative treatment.  Marland Kitchen TIA (transient ischemic attack)     carotid dopplers 5/13: no ICA stenosis   Past Surgical History  Procedure Laterality Date  . Appendectomy    . Femoral bypass  before 2003    LLE  . Incision and drainage anterior neck  10/2001    absces sternoclavicular joint  . Coronary angioplasty with stent placement  09/2008  . Coronary angioplasty with stent placement  04/2001  . Back surgery  06/2004    L3, L4 lami; L2, L5 hemilami; decompresion L2-3, 3-4, 4-5   Family History  Problem Relation Age of Onset  . Stroke Mother   . Stroke Father     History  Substance Use Topics  . Smoking status: Never Smoker   . Smokeless tobacco: Never Used  . Alcohol Use: No   OB History    No data available     Review of Systems  Constitutional: Negative for fever and chills.  HENT: Negative for congestion and sore throat.   Respiratory: Negative for cough.   Cardiovascular: Negative for chest pain.  Gastrointestinal: Positive for abdominal pain and diarrhea. Negative for nausea and vomiting.  Genitourinary: Negative for dysuria, frequency and hematuria.  Musculoskeletal: Negative for back pain.  Skin: Negative for rash.  Neurological: Positive for weakness. Negative for headaches.  Hematological: Does not bruise/bleed easily.  Psychiatric/Behavioral: Negative for confusion.      Allergies  Diphenhydramine hcl; Penicillins; and Shellfish allergy  Home Medications   Prior to Admission medications   Medication Sig Start Date End Date Taking? Authorizing Provider  alendronate (FOSAMAX) 70 MG tablet Take 70 mg by mouth once a week. Take with a full glass of water on an empty stomach.    Historical Provider, MD  aspirin 81 MG tablet Take 1 tablet (81 mg total) by mouth daily. 08/21/12   Minus Breeding, MD  bimatoprost (LUMIGAN) 0.03 % ophthalmic drops Apply 1 drop to eye at bedtime.     Historical Provider, MD  brimonidine (ALPHAGAN) 0.15 % ophthalmic solution Place 1 drop into the right eye 2 (two) times daily. 04/16/11   Hester Mates, MD  clopidogrel (PLAVIX) 75 MG tablet Take 75 mg by mouth daily. 07/12/10 11/09/13  Minus Breeding, MD  Coenzyme Q10 (CO Q-10) 50 MG CAPS Take 1 capsule by mouth daily.      Historical Provider, MD  Cyanocobalamin (VITAMIN B-12) 5000 MCG SUBL Place 1 tablet under the tongue daily.      Historical Provider, MD  metoprolol succinate (TOPROL-XL) 50 MG 24 hr tablet TAKE ONE TABLET BY MOUTH EVERY DAY 09/30/12   Minus Breeding, MD  nitroGLYCERIN (NITROSTAT) 0.4 MG SL tablet Place 1 tablet (0.4 mg total) under the  tongue every 5 (five) minutes as needed. For chest pain 09/14/12   Minus Breeding, MD  ramipril (ALTACE) 10 MG capsule TAKE ONE CAPSULE BY MOUTH EVERY DAY 09/30/12   Minus Breeding, MD  traMADol (ULTRAM) 50 MG tablet Take 1-2 tablets (50-100 mg total) by mouth 2 (two) times daily. Take 2 tablets every morning and take 1 tablet every evening 04/16/11   Hester Mates, MD   BP 159/97 mmHg  Pulse 62  Temp(Src) 98.3 F (36.8 C) (Oral)  Resp 13  Ht 5' (1.524 m)  Wt 109 lb (49.442 kg)  BMI 21.29 kg/m2  SpO2 100% Physical Exam  Constitutional: She is  oriented to person, place, and time. She appears well-developed and well-nourished.  HENT:  Head: Normocephalic and atraumatic.  Mouth/Throat: Oropharynx is clear and moist. No oropharyngeal exudate.  Eyes: EOM are normal. Pupils are equal, round, and reactive to light.  Neck: Normal range of motion.  Cardiovascular: Normal rate, regular rhythm and normal heart sounds.  Exam reveals no gallop and no friction rub.   No murmur heard. Pulmonary/Chest: Effort normal and breath sounds normal. No respiratory distress. She has no wheezes. She has no rales.  Abdominal: Soft. There is tenderness (Mild TTP over LLQ). There is no rebound and no guarding.  Genitourinary:  Normal-appearing external rectum. No significant amount of stool seen on glove. Yellowish appearing rectal mucus.  Musculoskeletal: Normal range of motion. She exhibits no edema.  Neurological: She is alert and oriented to person, place, and time.  Skin: Skin is warm and dry. No rash noted.  Psychiatric: She has a normal mood and affect. Her behavior is normal.  Nursing note and vitals reviewed.   ED Course  Procedures   DIAGNOSTIC STUDIES: Oxygen Saturation is 100% on RA, normal by my interpretation.    COORDINATION OF CARE: 12:49 AM Discussed treatment plan with pt at bedside and pt agreed to plan.   Labs Review Labs Reviewed  CBC WITH DIFFERENTIAL - Abnormal; Notable for the  following:    RBC 3.66 (*)    Hemoglobin 11.5 (*)    All other components within normal limits  COMPREHENSIVE METABOLIC PANEL - Abnormal; Notable for the following:    Glucose, Bld 138 (*)    Calcium 11.7 (*)    Albumin 3.4 (*)    Total Bilirubin <0.2 (*)    GFR calc non Af Amer 59 (*)    GFR calc Af Amer 69 (*)    All other components within normal limits  POC OCCULT BLOOD, ED - Abnormal; Notable for the following:    Fecal Occult Bld POSITIVE (*)    All other components within normal limits  LIPASE, BLOOD  URINALYSIS, ROUTINE W REFLEX MICROSCOPIC  LACTIC ACID, PLASMA    Imaging Review Ct Abdomen Pelvis W Contrast  03/11/2014   CLINICAL DATA:  Left lower quadrant pain  EXAM: CT ABDOMEN AND PELVIS WITH CONTRAST  TECHNIQUE: Multidetector CT imaging of the abdomen and pelvis was performed using the standard protocol following bolus administration of intravenous contrast.  CONTRAST:  86mL OMNIPAQUE IOHEXOL 300 MG/ML  SOLN  COMPARISON:  08/09/2003  FINDINGS: BODY WALL: No acute findings  LOWER CHEST: No acute findings  ABDOMEN/PELVIS:  Liver: No pathologic findings  Biliary: No evidence of biliary obstruction or stone.  Pancreas: A 6 mm cyst within the pancreatic head has minimally enlarged since 2005. There is also a ovoid cyst in the pancreatic head/body junction measuring 11 mm, similar to 2005. There are likely additional, even smaller, low-density foci within the pancreatic tail on coronal imaging. No main duct enlargement. These likely represent low-grade neoplasms (intraductal papillary mucinous neoplasms) but are benign in appearance.  Spleen: Unremarkable.  Adrenals: Unremarkable.  Kidneys and ureters: No hydronephrosis or stone. 2.5 cm left lower pole cyst.  Bladder: Unremarkable.  Reproductive: Unremarkable.  Bowel: No obstruction. Few, noninflamed colonic diverticula.  Retroperitoneum: No mass or adenopathy.  Peritoneum: No ascites or pneumoperitoneum.  Vascular: Atherosclerosis is  advanced and diffuse, with bilateral superficial femoral artery stenosis/occlusion. A graft is partially visualized within the upper left thigh. No major mesenteric vessel stenosis to explain acute pain.  OSSEOUS: Severe lumbar  degenerative disc disease with advanced canal stenosis at L4-5. There is lumbar levoscoliosis which has progressed from 2005.  IMPRESSION: 1. No acute intra-abdominal findings. 2. Chronic findings are essentially stable from 2005 and described above.   Electronically Signed   By: Jorje Guild M.D.   On: 03/11/2014 03:47     EKG Interpretation   Date/Time:  Friday March 11 2014 01:01:43 EST Ventricular Rate:  64 PR Interval:  202 QRS Duration: 79 QT Interval:  383 QTC Calculation: 395 R Axis:   3 Text Interpretation:  Age not entered, assumed to be  80 years old for  purpose of ECG interpretation Sinus rhythm Borderline prolonged PR  interval Low voltage, precordial leads No significant change since last  tracing Confirmed by Blakelynn Scheeler  MD, Janai Maudlin ([id number]) on 03/11/2014 1:05:59 AM      MDM   Final diagnoses:  LLQ pain  Occult blood in stools  Pancreatic mass   12:58 AM 80 y.o. female w hx of TIA, CAD s/p PCI, ischemic colitis in 2012 who presents with lower abdominal pain and diarrhea which began approximately 3 hours ago. She notes her pain is minimal amount rates it a 5 out of 10. Her stool is watery and brown without blood. She denies any nausea or vomiting. She is afebrile and vital signs are unremarkable here. We'll get screening labs and imaging given her history of ischemic colitis.  Fecal occult positive.   5:02 AM: Pt's pain controlled. I interpreted/reviewed the labs and/or imaging which were non-contributory.  She continues to appear well. Potentially her sx are related to something she ate versus viral syndrome. Occult blood pos but Hgb stable and pt notes normal stools. Doubt ischemic colitis given normal lactate, normal wbc, normal CT, and  benign abd.  I have discussed the diagnosis/risks/treatment options with the patient and family and believe the pt to be eligible for discharge home to follow-up with her pcp as needed. We also discussed returning to the ED immediately if new or worsening sx occur. We discussed the sx which are most concerning (e.g., return of abd pain, fever, vomiting) that necessitate immediate return. Medications administered to the patient during their visit and any new prescriptions provided to the patient are listed below.  Medications given during this visit Medications  sodium chloride 0.9 % bolus 500 mL (0 mLs Intravenous Stopped 03/11/14 0333)  fentaNYL (SUBLIMAZE) injection 50 mcg (50 mcg Intravenous Given 03/11/14 0131)  iohexol (OMNIPAQUE) 300 MG/ML solution 50 mL (50 mLs Oral Contrast Given 03/11/14 0108)  iohexol (OMNIPAQUE) 300 MG/ML solution 80 mL (80 mLs Intravenous Contrast Given 03/11/14 0322)    Discharge Medication List as of 03/11/2014  5:03 AM         I personally performed the services described in this documentation, which was scribed in my presence. The recorded information has been reviewed and is accurate.      Pamella Pert, MD 03/11/14 1734

## 2014-03-11 NOTE — ED Notes (Signed)
EMS reports FS 94

## 2014-03-11 NOTE — Discharge Instructions (Signed)
Abdominal Pain, Women °Abdominal (stomach, pelvic, or belly) pain can be caused by many things. It is important to tell your doctor: °· The location of the pain. °· Does it come and go or is it present all the time? °· Are there things that start the pain (eating certain foods, exercise)? °· Are there other symptoms associated with the pain (fever, nausea, vomiting, diarrhea)? °All of this is helpful to know when trying to find the cause of the pain. °CAUSES  °· Stomach: virus or bacteria infection, or ulcer. °· Intestine: appendicitis (inflamed appendix), regional ileitis (Crohn's disease), ulcerative colitis (inflamed colon), irritable bowel syndrome, diverticulitis (inflamed diverticulum of the colon), or cancer of the stomach or intestine. °· Gallbladder disease or stones in the gallbladder. °· Kidney disease, kidney stones, or infection. °· Pancreas infection or cancer. °· Fibromyalgia (pain disorder). °· Diseases of the female organs: °¨ Uterus: fibroid (non-cancerous) tumors or infection. °¨ Fallopian tubes: infection or tubal pregnancy. °¨ Ovary: cysts or tumors. °¨ Pelvic adhesions (scar tissue). °¨ Endometriosis (uterus lining tissue growing in the pelvis and on the pelvic organs). °¨ Pelvic congestion syndrome (female organs filling up with blood just before the menstrual period). °¨ Pain with the menstrual period. °¨ Pain with ovulation (producing an egg). °¨ Pain with an IUD (intrauterine device, birth control) in the uterus. °¨ Cancer of the female organs. °· Functional pain (pain not caused by a disease, may improve without treatment). °· Psychological pain. °· Depression. °DIAGNOSIS  °Your doctor will decide the seriousness of your pain by doing an examination. °· Blood tests. °· X-rays. °· Ultrasound. °· CT scan (computed tomography, special type of X-ray). °· MRI (magnetic resonance imaging). °· Cultures, for infection. °· Barium enema (dye inserted in the large intestine, to better view it with  X-rays). °· Colonoscopy (looking in intestine with a lighted tube). °· Laparoscopy (minor surgery, looking in abdomen with a lighted tube). °· Major abdominal exploratory surgery (looking in abdomen with a large incision). °TREATMENT  °The treatment will depend on the cause of the pain.  °· Many cases can be observed and treated at home. °· Over-the-counter medicines recommended by your caregiver. °· Prescription medicine. °· Antibiotics, for infection. °· Birth control pills, for painful periods or for ovulation pain. °· Hormone treatment, for endometriosis. °· Nerve blocking injections. °· Physical therapy. °· Antidepressants. °· Counseling with a psychologist or psychiatrist. °· Minor or major surgery. °HOME CARE INSTRUCTIONS  °· Do not take laxatives, unless directed by your caregiver. °· Take over-the-counter pain medicine only if ordered by your caregiver. Do not take aspirin because it can cause an upset stomach or bleeding. °· Try a clear liquid diet (broth or water) as ordered by your caregiver. Slowly move to a bland diet, as tolerated, if the pain is related to the stomach or intestine. °· Have a thermometer and take your temperature several times a day, and record it. °· Bed rest and sleep, if it helps the pain. °· Avoid sexual intercourse, if it causes pain. °· Avoid stressful situations. °· Keep your follow-up appointments and tests, as your caregiver orders. °· If the pain does not go away with medicine or surgery, you may try: °¨ Acupuncture. °¨ Relaxation exercises (yoga, meditation). °¨ Group therapy. °¨ Counseling. °SEEK MEDICAL CARE IF:  °· You notice certain foods cause stomach pain. °· Your home care treatment is not helping your pain. °· You need stronger pain medicine. °· You want your IUD removed. °· You feel faint or   lightheaded. °· You develop nausea and vomiting. °· You develop a rash. °· You are having side effects or an allergy to your medicine. °SEEK IMMEDIATE MEDICAL CARE IF:  °· Your  pain does not go away or gets worse. °· You have a fever. °· Your pain is felt only in portions of the abdomen. The right side could possibly be appendicitis. The left lower portion of the abdomen could be colitis or diverticulitis. °· You are passing blood in your stools (bright red or black tarry stools, with or without vomiting). °· You have blood in your urine. °· You develop chills, with or without a fever. °· You pass out. °MAKE SURE YOU:  °· Understand these instructions. °· Will watch your condition. °· Will get help right away if you are not doing well or get worse. °Document Released: 01/27/2007 Document Revised: 08/16/2013 Document Reviewed: 02/16/2009 °[employer]® Patient Information ©2015 [employer]. This information is not intended to replace advice given to you by your health care provider. Make sure you discuss any questions you have with your health care provider. ° °

## 2014-03-11 NOTE — ED Notes (Signed)
Patient presents via EMS stating about 2245 she started with lower abd pain, had 1 bout of diahhrea and became nauseated  EMS reports BP 170/90, P 63, 95-99% on RA.  Upon arrival to the ED, stated that the nausea was completely gone

## 2014-04-07 DIAGNOSIS — M4150 Other secondary scoliosis, site unspecified: Secondary | ICD-10-CM | POA: Diagnosis not present

## 2014-04-07 DIAGNOSIS — M4726 Other spondylosis with radiculopathy, lumbar region: Secondary | ICD-10-CM | POA: Diagnosis not present

## 2014-04-07 DIAGNOSIS — M4806 Spinal stenosis, lumbar region: Secondary | ICD-10-CM | POA: Diagnosis not present

## 2014-04-19 DIAGNOSIS — H4011X1 Primary open-angle glaucoma, mild stage: Secondary | ICD-10-CM | POA: Diagnosis not present

## 2014-04-27 DIAGNOSIS — M4806 Spinal stenosis, lumbar region: Secondary | ICD-10-CM | POA: Diagnosis not present

## 2014-04-29 DIAGNOSIS — Z681 Body mass index (BMI) 19 or less, adult: Secondary | ICD-10-CM | POA: Diagnosis not present

## 2014-04-29 DIAGNOSIS — M549 Dorsalgia, unspecified: Secondary | ICD-10-CM | POA: Diagnosis not present

## 2014-05-20 DIAGNOSIS — M4150 Other secondary scoliosis, site unspecified: Secondary | ICD-10-CM | POA: Diagnosis not present

## 2014-05-20 DIAGNOSIS — M4806 Spinal stenosis, lumbar region: Secondary | ICD-10-CM | POA: Diagnosis not present

## 2014-07-18 DIAGNOSIS — I10 Essential (primary) hypertension: Secondary | ICD-10-CM | POA: Diagnosis not present

## 2014-07-18 DIAGNOSIS — M4806 Spinal stenosis, lumbar region: Secondary | ICD-10-CM | POA: Diagnosis not present

## 2014-07-18 DIAGNOSIS — M4150 Other secondary scoliosis, site unspecified: Secondary | ICD-10-CM | POA: Diagnosis not present

## 2014-07-18 DIAGNOSIS — M4726 Other spondylosis with radiculopathy, lumbar region: Secondary | ICD-10-CM | POA: Diagnosis not present

## 2014-07-19 DIAGNOSIS — H4011X1 Primary open-angle glaucoma, mild stage: Secondary | ICD-10-CM | POA: Diagnosis not present

## 2014-08-08 DIAGNOSIS — M4806 Spinal stenosis, lumbar region: Secondary | ICD-10-CM | POA: Diagnosis not present

## 2014-08-08 DIAGNOSIS — M47816 Spondylosis without myelopathy or radiculopathy, lumbar region: Secondary | ICD-10-CM | POA: Diagnosis not present

## 2014-08-08 DIAGNOSIS — M47897 Other spondylosis, lumbosacral region: Secondary | ICD-10-CM | POA: Diagnosis not present

## 2014-08-10 DIAGNOSIS — M47816 Spondylosis without myelopathy or radiculopathy, lumbar region: Secondary | ICD-10-CM | POA: Diagnosis not present

## 2014-08-25 DIAGNOSIS — N183 Chronic kidney disease, stage 3 (moderate): Secondary | ICD-10-CM | POA: Diagnosis not present

## 2014-08-25 DIAGNOSIS — E782 Mixed hyperlipidemia: Secondary | ICD-10-CM | POA: Diagnosis not present

## 2014-08-25 DIAGNOSIS — E210 Primary hyperparathyroidism: Secondary | ICD-10-CM | POA: Diagnosis not present

## 2014-08-25 DIAGNOSIS — Z681 Body mass index (BMI) 19 or less, adult: Secondary | ICD-10-CM | POA: Diagnosis not present

## 2014-08-25 DIAGNOSIS — I719 Aortic aneurysm of unspecified site, without rupture: Secondary | ICD-10-CM | POA: Diagnosis not present

## 2014-08-25 DIAGNOSIS — I10 Essential (primary) hypertension: Secondary | ICD-10-CM | POA: Diagnosis not present

## 2014-10-14 DIAGNOSIS — H4011X1 Primary open-angle glaucoma, mild stage: Secondary | ICD-10-CM | POA: Diagnosis not present

## 2014-11-04 DIAGNOSIS — Z1231 Encounter for screening mammogram for malignant neoplasm of breast: Secondary | ICD-10-CM | POA: Diagnosis not present

## 2015-01-31 DIAGNOSIS — Z681 Body mass index (BMI) 19 or less, adult: Secondary | ICD-10-CM | POA: Diagnosis not present

## 2015-01-31 DIAGNOSIS — E785 Hyperlipidemia, unspecified: Secondary | ICD-10-CM | POA: Diagnosis not present

## 2015-01-31 DIAGNOSIS — G459 Transient cerebral ischemic attack, unspecified: Secondary | ICD-10-CM | POA: Diagnosis not present

## 2015-01-31 DIAGNOSIS — N183 Chronic kidney disease, stage 3 (moderate): Secondary | ICD-10-CM | POA: Diagnosis not present

## 2015-01-31 DIAGNOSIS — I340 Nonrheumatic mitral (valve) insufficiency: Secondary | ICD-10-CM | POA: Diagnosis not present

## 2015-01-31 DIAGNOSIS — I10 Essential (primary) hypertension: Secondary | ICD-10-CM | POA: Diagnosis not present

## 2015-01-31 DIAGNOSIS — I519 Heart disease, unspecified: Secondary | ICD-10-CM | POA: Diagnosis not present

## 2015-01-31 DIAGNOSIS — I701 Atherosclerosis of renal artery: Secondary | ICD-10-CM | POA: Diagnosis not present

## 2015-02-13 DIAGNOSIS — H401132 Primary open-angle glaucoma, bilateral, moderate stage: Secondary | ICD-10-CM | POA: Diagnosis not present

## 2015-03-27 DIAGNOSIS — M4726 Other spondylosis with radiculopathy, lumbar region: Secondary | ICD-10-CM | POA: Diagnosis not present

## 2015-04-27 ENCOUNTER — Ambulatory Visit: Payer: Medicare Other | Admitting: Cardiology

## 2015-05-04 DIAGNOSIS — H401132 Primary open-angle glaucoma, bilateral, moderate stage: Secondary | ICD-10-CM | POA: Diagnosis not present

## 2015-05-07 NOTE — Progress Notes (Signed)
HPI The patient presents for followup of known coronary disease. This is her first appt since July of 2015.   Since I last saw her she has done well.  The patient denies any new symptoms such as chest discomfort, neck or arm discomfort. There has been no new shortness of breath, PND or orthopnea. There have been no reported palpitations, presyncope or syncope.  She gets around with a cane.  Allergies  Allergen Reactions  . Diphenhydramine Hcl Other (See Comments)    unknown  . Penicillins Other (See Comments)    unknown  . Shellfish Allergy Other (See Comments)    unknown    Current Outpatient Prescriptions  Medication Sig Dispense Refill  . Ascorbic Acid (VITAMIN C WITH ROSE HIPS) 250 MG tablet Take 250 mg by mouth daily.    Marland Kitchen aspirin 81 MG tablet Take 1 tablet (81 mg total) by mouth daily.    Marland Kitchen b complex vitamins tablet Take 1 tablet by mouth daily.    . bimatoprost (LUMIGAN) 0.03 % ophthalmic drops Apply 1 drop to eye at bedtime.     . brimonidine (ALPHAGAN) 0.15 % ophthalmic solution Place 1 drop into the right eye 2 (two) times daily. 5 mL 0  . Coenzyme Q10 (CO Q-10) 50 MG CAPS Take 1 capsule by mouth daily.      . Cyanocobalamin (VITAMIN B-12) 5000 MCG SUBL Place 1 tablet under the tongue daily.      . dorzolamide (TRUSOPT) 2 % ophthalmic solution Place 1 drop into both eyes 2 (two) times daily.    . metoprolol succinate (TOPROL-XL) 50 MG 24 hr tablet TAKE ONE TABLET BY MOUTH EVERY DAY 30 tablet 6  . nitroGLYCERIN (NITROSTAT) 0.4 MG SL tablet Place 1 tablet (0.4 mg total) under the tongue every 5 (five) minutes as needed. For chest pain 25 tablet 2  . ramipril (ALTACE) 10 MG capsule TAKE ONE CAPSULE BY MOUTH EVERY DAY 30 capsule 6  . timolol (BETIMOL) 0.5 % ophthalmic solution Place 1 drop into both eyes 2 (two) times daily.    . traMADol (ULTRAM) 50 MG tablet Take 1-2 tablets (50-100 mg total) by mouth 2 (two) times daily. Take 2 tablets every morning and take 1 tablet every  evening 30 tablet 0  . clopidogrel (PLAVIX) 75 MG tablet Take 75 mg by mouth daily.    . [DISCONTINUED] simvastatin (ZOCOR) 40 MG tablet Take 1 tablet (40 mg total) by mouth at bedtime. 30 tablet 0   No current facility-administered medications for this visit.    Past Medical History  Diagnosis Date  . CAD (coronary artery disease) 2003, 2010    s/p stenting of RCA x2, s/p stent to OM;  LHC 04/2009 in the setting of a NSTEMI: EF 65%, RCA stent patent with 60% ISR proximal and 40% ISR mid, PLV proximal 50-60% and distal 50%, ostial circumflex 30-40%, proximal OM mild in-stent restenosis with a branch with 90+ percent ostial stenosis, proximal LAD 30-40%.  FFR of ostial circumflex:  0.96 - not flow limiting.  Medical therapy was continued.  . Hyperlipidemia   . HTN (hypertension)     Echocardiogram 08/26/11: Mild focal basal septal hypertrophy, EF 0000000, grade 2 diastolic dysfunction, mild MR.  There was a fluid-filled area likely in the location of the liver noted  . PVD (peripheral vascular disease) (HCC)     s/p left fem-pop bypass graft  . Hyperparathyroidism 2003    parathyroid adenoma localized to the right inferior thyroid lobe  region  . Renal artery stenosis (HCC)     left, 40%  . Osteoporosis 2009    diagnosed by bone density scan in 2009, on Vit D only, no bisphosphonate therapy  . Bacteremia 2003    hx of staph bacteremia  . Degenerative disc disease   . Lumbar stenosis with neurogenic claudication 2006    s/p decompression L2-L5  . Abscess 2003    I&D SCM abscess  . Arthritis   . History of medication noncompliance     concerning Plavix  . Depression   . Phlebitis     LLE  . Hypothyroidism   . Acute ischemic colitis ([place]) 04/10/11    acute episode of ischemic colitis, with abd pain, CT evidence of transverse colitis, and blood streaked BM.  Resolved with conservative treatment.  Marland Kitchen TIA (transient ischemic attack)     carotid dopplers 5/13: no ICA stenosis    Past  Surgical History  Procedure Laterality Date  . Appendectomy    . Femoral bypass  before 2003    LLE  . Incision and drainage anterior neck  10/2001    absces sternoclavicular joint  . Coronary angioplasty with stent placement  09/2008  . Coronary angioplasty with stent placement  04/2001  . Back surgery  06/2004    L3, L4 lami; L2, L5 hemilami; decompresion L2-3, 3-4, 4-5    ROS:   Positive for back pain.  Otherwise as stated in the HPI and negative for all other systems.  PHYSICAL EXAM BP 158/80 mmHg  Pulse 56  Ht 5\' 2"  (1.575 m)  Wt 108 lb (48.988 kg)  BMI 19.75 kg/m2 GENERAL:  Well appearing, small HEENT:  Pupils equal round and reactive, fundi not visualized, oral mucosa unremarkable, denutres NECK:  No jugular venous distention, waveform within normal limits, carotid upstroke brisk and symmetric, no bruits, no thyromegaly LUNGS:  Clear to auscultation bilaterally BACK:  No CVA tenderness,miild lordosis. CHEST:  Unremarkable HEART:  PMI not displaced or sustained,S1 and S2 within normal limits, no S3, no S4, no clicks, no rubs, no murmurs ABD:  Flat, positive bowel sounds normal in frequency in pitch, no bruits, no rebound, no guarding, no midline pulsatile mass, no hepatomegaly, no splenomegaly EXT:  2 plus pulses upper, decreased PT/DP bilateral, no edema, no cyanosis no clubbing  EKG:   Sinus bradycardia, rate 56, axis within normal limits, intervals within normal limits, possible old anteroseptal MI , no acute ST-T wave changes..  05/08/2015   ASSESSMENT AND PLAN  CAD:  The patient has no new sypmtoms.  No further cardiovascular testing is indicated.  We will continue with aggressive risk reduction and meds as listed. I will continue the Plavix given her residual coronary disease symptoms previously and need for aggressive medical management.  HTN:  The blood pressure is mildly elevated. She will keep a blood pressure diary.. No change in medications is indicated. We will  continue with therapeutic lifestyle changes (TLC).  DYSLIPIDEMIA:  She does not take the Zocor and statins cause muscle aches.  She will remain off of these.

## 2015-05-08 ENCOUNTER — Ambulatory Visit (INDEPENDENT_AMBULATORY_CARE_PROVIDER_SITE_OTHER): Payer: Medicare Other | Admitting: Cardiology

## 2015-05-08 ENCOUNTER — Encounter: Payer: Self-pay | Admitting: Cardiology

## 2015-05-08 VITALS — BP 158/80 | HR 56 | Ht 62.0 in | Wt 108.0 lb

## 2015-05-08 DIAGNOSIS — I2510 Atherosclerotic heart disease of native coronary artery without angina pectoris: Secondary | ICD-10-CM | POA: Diagnosis not present

## 2015-05-08 NOTE — Patient Instructions (Signed)
Dr Percival Spanish has made no changes today in your current medications or treatment plan.  Your physician recommends that you schedule a follow-up appointment in 18 months. You will receive a reminder letter in the mail two months in advance. If you don't receive a letter, please call our office to schedule the follow-up appointment.  If you need a refill on your cardiac medications before your next appointment, please call your pharmacy.

## 2015-05-30 DIAGNOSIS — R030 Elevated blood-pressure reading, without diagnosis of hypertension: Secondary | ICD-10-CM | POA: Diagnosis not present

## 2015-05-30 DIAGNOSIS — M47816 Spondylosis without myelopathy or radiculopathy, lumbar region: Secondary | ICD-10-CM | POA: Diagnosis not present

## 2015-06-07 DIAGNOSIS — E210 Primary hyperparathyroidism: Secondary | ICD-10-CM | POA: Diagnosis not present

## 2015-06-07 DIAGNOSIS — E782 Mixed hyperlipidemia: Secondary | ICD-10-CM | POA: Diagnosis not present

## 2015-06-07 DIAGNOSIS — I2510 Atherosclerotic heart disease of native coronary artery without angina pectoris: Secondary | ICD-10-CM | POA: Diagnosis not present

## 2015-06-07 DIAGNOSIS — I10 Essential (primary) hypertension: Secondary | ICD-10-CM | POA: Diagnosis not present

## 2015-06-07 DIAGNOSIS — Z681 Body mass index (BMI) 19 or less, adult: Secondary | ICD-10-CM | POA: Diagnosis not present

## 2015-06-07 DIAGNOSIS — N183 Chronic kidney disease, stage 3 (moderate): Secondary | ICD-10-CM | POA: Diagnosis not present

## 2015-07-27 DIAGNOSIS — H401132 Primary open-angle glaucoma, bilateral, moderate stage: Secondary | ICD-10-CM | POA: Diagnosis not present

## 2015-09-13 DIAGNOSIS — N183 Chronic kidney disease, stage 3 (moderate): Secondary | ICD-10-CM | POA: Diagnosis not present

## 2015-09-13 DIAGNOSIS — I10 Essential (primary) hypertension: Secondary | ICD-10-CM | POA: Diagnosis not present

## 2015-09-13 DIAGNOSIS — E210 Primary hyperparathyroidism: Secondary | ICD-10-CM | POA: Diagnosis not present

## 2015-09-13 DIAGNOSIS — M810 Age-related osteoporosis without current pathological fracture: Secondary | ICD-10-CM | POA: Diagnosis not present

## 2015-09-13 DIAGNOSIS — I701 Atherosclerosis of renal artery: Secondary | ICD-10-CM | POA: Diagnosis not present

## 2015-09-13 DIAGNOSIS — I519 Heart disease, unspecified: Secondary | ICD-10-CM | POA: Diagnosis not present

## 2015-09-13 DIAGNOSIS — I719 Aortic aneurysm of unspecified site, without rupture: Secondary | ICD-10-CM | POA: Diagnosis not present

## 2015-09-13 DIAGNOSIS — E785 Hyperlipidemia, unspecified: Secondary | ICD-10-CM | POA: Diagnosis not present

## 2015-09-13 DIAGNOSIS — Z681 Body mass index (BMI) 19 or less, adult: Secondary | ICD-10-CM | POA: Diagnosis not present

## 2015-09-13 DIAGNOSIS — G459 Transient cerebral ischemic attack, unspecified: Secondary | ICD-10-CM | POA: Diagnosis not present

## 2015-09-18 DIAGNOSIS — M47816 Spondylosis without myelopathy or radiculopathy, lumbar region: Secondary | ICD-10-CM | POA: Diagnosis not present

## 2015-09-18 DIAGNOSIS — M4806 Spinal stenosis, lumbar region: Secondary | ICD-10-CM | POA: Diagnosis not present

## 2015-10-18 DIAGNOSIS — H401133 Primary open-angle glaucoma, bilateral, severe stage: Secondary | ICD-10-CM | POA: Diagnosis not present

## 2015-11-22 DIAGNOSIS — H401133 Primary open-angle glaucoma, bilateral, severe stage: Secondary | ICD-10-CM | POA: Diagnosis not present

## 2015-12-11 ENCOUNTER — Other Ambulatory Visit: Payer: Self-pay

## 2016-01-16 DIAGNOSIS — I519 Heart disease, unspecified: Secondary | ICD-10-CM | POA: Diagnosis not present

## 2016-01-16 DIAGNOSIS — N183 Chronic kidney disease, stage 3 (moderate): Secondary | ICD-10-CM | POA: Diagnosis not present

## 2016-01-16 DIAGNOSIS — E785 Hyperlipidemia, unspecified: Secondary | ICD-10-CM | POA: Diagnosis not present

## 2016-01-16 DIAGNOSIS — I701 Atherosclerosis of renal artery: Secondary | ICD-10-CM | POA: Diagnosis not present

## 2016-01-16 DIAGNOSIS — I10 Essential (primary) hypertension: Secondary | ICD-10-CM | POA: Diagnosis not present

## 2016-01-16 DIAGNOSIS — J700 Acute pulmonary manifestations due to radiation: Secondary | ICD-10-CM | POA: Diagnosis not present

## 2016-01-16 DIAGNOSIS — I711 Thoracic aortic aneurysm, ruptured: Secondary | ICD-10-CM | POA: Diagnosis not present

## 2016-01-16 DIAGNOSIS — Z681 Body mass index (BMI) 19 or less, adult: Secondary | ICD-10-CM | POA: Diagnosis not present

## 2016-01-16 DIAGNOSIS — I700 Atherosclerosis of aorta: Secondary | ICD-10-CM | POA: Diagnosis not present

## 2016-03-05 DIAGNOSIS — R030 Elevated blood-pressure reading, without diagnosis of hypertension: Secondary | ICD-10-CM | POA: Diagnosis not present

## 2016-03-05 DIAGNOSIS — M47816 Spondylosis without myelopathy or radiculopathy, lumbar region: Secondary | ICD-10-CM | POA: Diagnosis not present

## 2016-03-05 DIAGNOSIS — M4726 Other spondylosis with radiculopathy, lumbar region: Secondary | ICD-10-CM | POA: Diagnosis not present

## 2016-05-06 ENCOUNTER — Encounter (HOSPITAL_COMMUNITY)
Admission: EM | Disposition: A | Discharge status: Home / Self Care | Payer: Self-pay | Source: Home / Self Care | Attending: Cardiovascular Disease

## 2016-05-06 ENCOUNTER — Inpatient Hospital Stay (HOSPITAL_COMMUNITY)
Admission: EM | Admit: 2016-05-06 | Discharge: 2016-05-09 | DRG: 247 | Disposition: A | Discharge status: Home / Self Care | Payer: Medicare Other | Attending: Cardiovascular Disease | Admitting: Cardiovascular Disease

## 2016-05-06 ENCOUNTER — Encounter (HOSPITAL_COMMUNITY): Payer: Self-pay | Admitting: Emergency Medicine

## 2016-05-06 ENCOUNTER — Other Ambulatory Visit (HOSPITAL_COMMUNITY): Payer: Self-pay | Admitting: Cardiovascular Disease

## 2016-05-06 DIAGNOSIS — I110 Hypertensive heart disease with heart failure: Secondary | ICD-10-CM | POA: Diagnosis present

## 2016-05-06 DIAGNOSIS — I739 Peripheral vascular disease, unspecified: Secondary | ICD-10-CM | POA: Diagnosis present

## 2016-05-06 DIAGNOSIS — Z8673 Personal history of transient ischemic attack (TIA), and cerebral infarction without residual deficits: Secondary | ICD-10-CM

## 2016-05-06 DIAGNOSIS — I252 Old myocardial infarction: Secondary | ICD-10-CM | POA: Diagnosis not present

## 2016-05-06 DIAGNOSIS — I25110 Atherosclerotic heart disease of native coronary artery with unstable angina pectoris: Secondary | ICD-10-CM | POA: Diagnosis not present

## 2016-05-06 DIAGNOSIS — I10 Essential (primary) hypertension: Secondary | ICD-10-CM | POA: Diagnosis present

## 2016-05-06 DIAGNOSIS — I2583 Coronary atherosclerosis due to lipid rich plaque: Secondary | ICD-10-CM | POA: Diagnosis present

## 2016-05-06 DIAGNOSIS — I2119 ST elevation (STEMI) myocardial infarction involving other coronary artery of inferior wall: Principal | ICD-10-CM | POA: Diagnosis present

## 2016-05-06 DIAGNOSIS — E782 Mixed hyperlipidemia: Secondary | ICD-10-CM

## 2016-05-06 DIAGNOSIS — Z823 Family history of stroke: Secondary | ICD-10-CM | POA: Diagnosis not present

## 2016-05-06 DIAGNOSIS — I2111 ST elevation (STEMI) myocardial infarction involving right coronary artery: Secondary | ICD-10-CM

## 2016-05-06 DIAGNOSIS — T82855A Stenosis of coronary artery stent, initial encounter: Secondary | ICD-10-CM | POA: Diagnosis present

## 2016-05-06 DIAGNOSIS — I2510 Atherosclerotic heart disease of native coronary artery without angina pectoris: Secondary | ICD-10-CM

## 2016-05-06 DIAGNOSIS — Z95828 Presence of other vascular implants and grafts: Secondary | ICD-10-CM | POA: Diagnosis not present

## 2016-05-06 DIAGNOSIS — Z91013 Allergy to seafood: Secondary | ICD-10-CM | POA: Diagnosis not present

## 2016-05-06 DIAGNOSIS — E785 Hyperlipidemia, unspecified: Secondary | ICD-10-CM | POA: Diagnosis present

## 2016-05-06 DIAGNOSIS — Z880 Allergy status to penicillin: Secondary | ICD-10-CM

## 2016-05-06 DIAGNOSIS — J449 Chronic obstructive pulmonary disease, unspecified: Secondary | ICD-10-CM | POA: Diagnosis not present

## 2016-05-06 DIAGNOSIS — Z79899 Other long term (current) drug therapy: Secondary | ICD-10-CM

## 2016-05-06 DIAGNOSIS — I213 ST elevation (STEMI) myocardial infarction of unspecified site: Secondary | ICD-10-CM | POA: Diagnosis present

## 2016-05-06 DIAGNOSIS — Z7982 Long term (current) use of aspirin: Secondary | ICD-10-CM | POA: Diagnosis not present

## 2016-05-06 DIAGNOSIS — Z888 Allergy status to other drugs, medicaments and biological substances status: Secondary | ICD-10-CM | POA: Diagnosis not present

## 2016-05-06 DIAGNOSIS — Z7902 Long term (current) use of antithrombotics/antiplatelets: Secondary | ICD-10-CM

## 2016-05-06 DIAGNOSIS — R079 Chest pain, unspecified: Secondary | ICD-10-CM | POA: Diagnosis not present

## 2016-05-06 HISTORY — PX: CARDIAC CATHETERIZATION: SHX172

## 2016-05-06 LAB — POCT I-STAT TROPONIN I: Troponin i, poc: 0.26 ng/mL (ref 0.00–0.08)

## 2016-05-06 LAB — LIPID PANEL
CHOLESTEROL: 260 mg/dL — AB (ref 0–200)
HDL: 70 mg/dL (ref >40)
LDL CALC: 177 mg/dL — AB (ref 0–99)
TRIGLYCERIDES: 67 mg/dL (ref <150)
Total CHOL/HDL Ratio: 3.7 RATIO
VLDL: 13 mg/dL (ref 0–40)

## 2016-05-06 LAB — DIFFERENTIAL
BASOS ABS: 0 10*3/uL (ref 0.0–0.1)
Basophils Relative: 0 %
Eosinophils Absolute: 0.1 10*3/uL (ref 0.0–0.7)
Eosinophils Relative: 1 %
LYMPHS PCT: 20 %
Lymphs Abs: 1.6 10*3/uL (ref 0.7–4.0)
MONO ABS: 0.2 10*3/uL (ref 0.1–1.0)
Monocytes Relative: 3 %
NEUTROS ABS: 6 10*3/uL (ref 1.7–7.7)
NEUTROS PCT: 76 %

## 2016-05-06 LAB — COMPREHENSIVE METABOLIC PANEL
ALBUMIN: 3.9 g/dL (ref 3.5–5.0)
ALT: 14 U/L (ref 14–54)
ANION GAP: 8 (ref 5–15)
AST: 29 U/L (ref 15–41)
Alkaline Phosphatase: 92 U/L (ref 38–126)
BUN: 16 mg/dL (ref 6–20)
CHLORIDE: 105 mmol/L (ref 101–111)
CO2: 26 mmol/L (ref 22–32)
Calcium: 11.5 mg/dL — ABNORMAL HIGH (ref 8.9–10.3)
Creatinine, Ser: 0.88 mg/dL (ref 0.44–1.00)
GFR calc non Af Amer: 57 mL/min — ABNORMAL LOW (ref >60)
GLUCOSE: 143 mg/dL — AB (ref 65–99)
POTASSIUM: 4.8 mmol/L (ref 3.5–5.1)
SODIUM: 139 mmol/L (ref 135–145)
Total Bilirubin: 0.5 mg/dL (ref 0.3–1.2)
Total Protein: 7 g/dL (ref 6.5–8.1)

## 2016-05-06 LAB — CBC
HCT: 34.4 % — ABNORMAL LOW (ref 36.0–46.0)
HEMOGLOBIN: 11.1 g/dL — AB (ref 12.0–15.0)
MCH: 31.2 pg (ref 26.0–34.0)
MCHC: 32.3 g/dL (ref 30.0–36.0)
MCV: 96.6 fL (ref 78.0–100.0)
Platelets: 164 10*3/uL (ref 150–400)
RBC: 3.56 MIL/uL — ABNORMAL LOW (ref 3.87–5.11)
RDW: 12.9 % (ref 11.5–15.5)
WBC: 7.9 10*3/uL (ref 4.0–10.5)

## 2016-05-06 LAB — PROTIME-INR
INR: 1.1
PROTHROMBIN TIME: 14.2 s (ref 11.4–15.2)

## 2016-05-06 LAB — TROPONIN I: Troponin I: 0.21 ng/mL (ref <0.03)

## 2016-05-06 LAB — APTT: APTT: 173 s — AB (ref 24–36)

## 2016-05-06 SURGERY — LEFT HEART CATH AND CORONARY ANGIOGRAPHY
Anesthesia: LOCAL

## 2016-05-06 SURGERY — LEFT HEART CATH AND CORONARY ANGIOGRAPHY

## 2016-05-06 MED ORDER — LIDOCAINE HCL (PF) 1 % IJ SOLN
INTRAMUSCULAR | Status: DC | PRN
  Administered 2016-05-06: 15 mL

## 2016-05-06 MED ORDER — FENTANYL CITRATE (PF) 100 MCG/2ML IJ SOLN
INTRAMUSCULAR | Status: AC
Start: 2016-05-06 — End: 2016-05-06
  Filled 2016-05-06: qty 2

## 2016-05-06 MED ORDER — MORPHINE SULFATE (PF) 4 MG/ML IV SOLN
2.0000 mg | INTRAVENOUS | Status: DC | PRN
  Administered 2016-05-07 (×2): 2 mg via INTRAVENOUS
  Filled 2016-05-06 (×2): qty 1

## 2016-05-06 MED ORDER — HYDRALAZINE HCL 20 MG/ML IJ SOLN
5.0000 mg | INTRAMUSCULAR | Status: DC | PRN
  Filled 2016-05-06: qty 1

## 2016-05-06 MED ORDER — LABETALOL HCL 5 MG/ML IV SOLN
10.0000 mg | INTRAVENOUS | Status: DC | PRN
  Administered 2016-05-06: 10 mg via INTRAVENOUS
  Filled 2016-05-06: qty 4

## 2016-05-06 MED ORDER — RAMIPRIL 10 MG PO CAPS
10.0000 mg | ORAL_CAPSULE | Freq: Every day | ORAL | Status: DC
  Administered 2016-05-07 – 2016-05-08 (×2): 10 mg via ORAL
  Filled 2016-05-06 (×3): qty 1

## 2016-05-06 MED ORDER — HEPARIN SODIUM (PORCINE) 5000 UNIT/ML IJ SOLN
3000.0000 [IU] | INTRAMUSCULAR | Status: AC
  Administered 2016-05-06: 3000 [IU] via INTRAVENOUS

## 2016-05-06 MED ORDER — ASPIRIN 81 MG PO CHEW
81.0000 mg | CHEWABLE_TABLET | Freq: Every day | ORAL | Status: DC
  Administered 2016-05-07 – 2016-05-09 (×3): 81 mg via ORAL
  Filled 2016-05-06 (×3): qty 1

## 2016-05-06 MED ORDER — SODIUM CHLORIDE 0.9 % IV SOLN
INTRAVENOUS | Status: DC | PRN
  Administered 2016-05-06: 1.75 mg/kg/h via INTRAVENOUS

## 2016-05-06 MED ORDER — SODIUM CHLORIDE 0.9% FLUSH
3.0000 mL | Freq: Two times a day (BID) | INTRAVENOUS | Status: DC
  Administered 2016-05-07 – 2016-05-09 (×6): 3 mL via INTRAVENOUS

## 2016-05-06 MED ORDER — METOPROLOL SUCCINATE ER 50 MG PO TB24
50.0000 mg | ORAL_TABLET | Freq: Every day | ORAL | Status: DC
  Administered 2016-05-07 – 2016-05-08 (×2): 50 mg via ORAL
  Filled 2016-05-06 (×3): qty 1

## 2016-05-06 MED ORDER — MIDAZOLAM HCL 2 MG/2ML IJ SOLN
INTRAMUSCULAR | Status: AC
  Filled 2016-05-06: qty 2

## 2016-05-06 MED ORDER — ONDANSETRON HCL 4 MG/2ML IJ SOLN: 4.0000 mg | Freq: Four times a day (QID) | INTRAMUSCULAR | Status: DC | PRN

## 2016-05-06 MED ORDER — TIROFIBAN HCL IV 12.5 MG/250 ML
INTRAVENOUS | Status: DC | PRN
  Administered 2016-05-06: .15 ug/kg/min via INTRAVENOUS

## 2016-05-06 MED ORDER — IOPAMIDOL (ISOVUE-370) INJECTION 76%
INTRAVENOUS | Status: DC | PRN
  Administered 2016-05-06: 115 mL via INTRA_ARTERIAL

## 2016-05-06 MED ORDER — TIROFIBAN HCL IN NACL 5-0.9 MG/100ML-% IV SOLN
INTRAVENOUS | Status: AC
  Filled 2016-05-06: qty 100

## 2016-05-06 MED ORDER — ACETAMINOPHEN 325 MG PO TABS: 650.0000 mg | ORAL_TABLET | ORAL | Status: DC | PRN

## 2016-05-06 MED ORDER — "THROMBI-PAD 3""X3"" EX PADS"
1.0000 | MEDICATED_PAD | Freq: Once | CUTANEOUS | Status: AC
  Administered 2016-05-06: 1 via TOPICAL
  Filled 2016-05-06: qty 1

## 2016-05-06 MED ORDER — LIDOCAINE HCL (PF) 1 % IJ SOLN
INTRAMUSCULAR | Status: AC
  Administered 2016-05-06: 23:00:00
  Filled 2016-05-06: qty 5

## 2016-05-06 MED ORDER — TICAGRELOR 90 MG PO TABS
ORAL_TABLET | ORAL | Status: AC
  Filled 2016-05-06: qty 2

## 2016-05-06 MED ORDER — NITROGLYCERIN 0.4 MG SL SUBL: 0.4000 mg | SUBLINGUAL_TABLET | SUBLINGUAL | Status: DC | PRN

## 2016-05-06 MED ORDER — SODIUM CHLORIDE 0.9 % IV SOLN
1.7500 mg/kg/h | INTRAVENOUS | Status: DC
  Filled 2016-05-06: qty 250

## 2016-05-06 MED ORDER — SODIUM CHLORIDE 0.9% FLUSH: 3.0000 mL | INTRAVENOUS | Status: DC | PRN

## 2016-05-06 MED ORDER — TIROFIBAN (AGGRASTAT) BOLUS VIA INFUSION
INTRAVENOUS | Status: DC | PRN
  Administered 2016-05-06: 1225 ug via INTRAVENOUS

## 2016-05-06 MED ORDER — LIDOCAINE HCL (PF) 1 % IJ SOLN
INTRAMUSCULAR | Status: AC
  Filled 2016-05-06: qty 30

## 2016-05-06 MED ORDER — HEPARIN SODIUM (PORCINE) 5000 UNIT/ML IJ SOLN: 60.0000 [IU]/kg | Freq: Once | INTRAMUSCULAR | Status: DC

## 2016-05-06 MED ORDER — BIVALIRUDIN 250 MG IV SOLR
INTRAVENOUS | Status: AC
  Filled 2016-05-06: qty 250

## 2016-05-06 MED ORDER — HYDRALAZINE HCL 20 MG/ML IJ SOLN: 10.0000 mg | INTRAMUSCULAR | Status: DC | PRN

## 2016-05-06 MED ORDER — TICAGRELOR 90 MG PO TABS
90.0000 mg | ORAL_TABLET | Freq: Two times a day (BID) | ORAL | Status: DC
  Administered 2016-05-07 – 2016-05-09 (×5): 90 mg via ORAL
  Filled 2016-05-06 (×5): qty 1

## 2016-05-06 MED ORDER — SODIUM CHLORIDE 0.9 % IV SOLN: 250.0000 mL | INTRAVENOUS | Status: DC | PRN

## 2016-05-06 MED ORDER — TICAGRELOR 90 MG PO TABS
ORAL_TABLET | ORAL | Status: DC | PRN
  Administered 2016-05-06: 180 mg via ORAL

## 2016-05-06 MED ORDER — SODIUM CHLORIDE 0.9 % IV SOLN
INTRAVENOUS | Status: DC
  Administered 2016-05-06: 22:00:00 via INTRAVENOUS

## 2016-05-06 MED ORDER — IOPAMIDOL (ISOVUE-370) INJECTION 76%
INTRAVENOUS | Status: AC
  Filled 2016-05-06: qty 125

## 2016-05-06 MED ORDER — BIVALIRUDIN BOLUS VIA INFUSION - CUPID
INTRAVENOUS | Status: DC | PRN
  Administered 2016-05-06: 36.75 mg via INTRAVENOUS

## 2016-05-06 MED ORDER — HEPARIN (PORCINE) IN NACL 2-0.9 UNIT/ML-% IJ SOLN
INTRAMUSCULAR | Status: AC
  Filled 2016-05-06: qty 1000

## 2016-05-06 MED ORDER — ASPIRIN 81 MG PO CHEW: 324.0000 mg | CHEWABLE_TABLET | Freq: Once | ORAL | Status: DC

## 2016-05-06 SURGICAL SUPPLY — 18 items
BALLN MOZEC 2.0X12 (BALLOONS) ×2
BALLN ~~LOC~~ EUPHORA RX 2.75X20 (BALLOONS) ×2
BALLOON MOZEC 2.0X12 (BALLOONS) IMPLANT
BALLOON ~~LOC~~ EUPHORA RX 2.75X20 (BALLOONS) IMPLANT
CATH 5FR JL3.5 JR4 ANG PIG MP (CATHETERS) ×1 IMPLANT
CATH INFINITI 5FR JL4 (CATHETERS) ×1 IMPLANT
CATH VISTA GUIDE 6FR JR4 (CATHETERS) ×1 IMPLANT
KIT ENCORE 26 ADVANTAGE (KITS) ×1 IMPLANT
KIT HEART LEFT (KITS) ×2 IMPLANT
PACK CARDIAC CATHETERIZATION (CUSTOM PROCEDURE TRAY) ×2 IMPLANT
SHEATH PINNACLE 6F 10CM (SHEATH) ×1 IMPLANT
SHEATH PINNACLE 7F 10CM (SHEATH) ×1 IMPLANT
STENT RESOLUTE ONYX 2.25X18 (Permanent Stent) ×1 IMPLANT
SYR MEDRAD MARK V 150ML (SYRINGE) ×2 IMPLANT
TRANSDUCER W/STOPCOCK (MISCELLANEOUS) ×2 IMPLANT
TUBING CIL FLEX 10 FLL-RA (TUBING) ×2 IMPLANT
WIRE ASAHI PROWATER 180CM (WIRE) ×1 IMPLANT
WIRE EMERALD 3MM-J .035X150CM (WIRE) ×1 IMPLANT

## 2016-05-06 NOTE — ED Notes (Signed)
Pt transferred to cath lab.

## 2016-05-06 NOTE — ED Triage Notes (Signed)
Pt. arrived with EMS from home reports intermittent central chest pain onset Saturday with mild SOB , she received 2 NTG sl and ASA 324 mg prior to arrival , history of MI / Coronary stent .

## 2016-05-06 NOTE — H&P (Signed)
ADMISSION HISTORY & PHYSICAL   Chief Complaint:  STEMI  Cardiologist: Dr. Percival Spanish  Primary Care Physician: Lillard Anes, MD  HPI:  This is a 83 y.o. female with a past medical history significant for CAD status post PCI 2 to the right coronary artery and OM in 2003 and 2010. Last left heart cath in 2011 demonstrated EF 65%, patent RCA stent was 60% in-stent restenosis proximally and 40% in-stent restenosis in the mid vessel. The PLV had proximal 50-60% stenosis and distal 50% stenosis, the ostial circumflex with 30-40% stenosed in the proximal OM demonstrated mild in-stent restenosis with a 90% branch ostial stenosis and proximal LAD of 30-40% stenosis. Medical therapy was recommended that time. Today she was at home and she reported intermittent central chest pain that x-ray started on Saturday with mild shortness of breath. She received 2 several nitroglycerin and aspirin. EKG on admission demonstrates 1-1/2 mm inferior and lateral ST elevation. Her chest pain is persistent. She has been evaluated by Dr. Gwenlyn Found, who is the on-call interventional cardiologist and deemed appropriate to take to the cardiac catheterization lab.  PMHx:  Past Medical History:  Diagnosis Date  . Abscess 2003   I&D SCM abscess  . Acute ischemic colitis ([place]) 04/10/11   acute episode of ischemic colitis, with abd pain, CT evidence of transverse colitis, and blood streaked BM.  Resolved with conservative treatment.  . Arthritis   . Bacteremia 2003   hx of staph bacteremia  . CAD (coronary artery disease) 2003, 2010   s/p stenting of RCA x2, s/p stent to OM;  LHC 04/2009 in the setting of a NSTEMI: EF 65%, RCA stent patent with 60% ISR proximal and 40% ISR mid, PLV proximal 50-60% and distal 50%, ostial circumflex 30-40%, proximal OM mild in-stent restenosis with a branch with 90+ percent ostial stenosis, proximal LAD 30-40%.  FFR of ostial circumflex:  0.96 - not flow limiting.  Medical therapy was  continued.  . Degenerative disc disease   . Depression   . History of medication noncompliance    concerning Plavix  . HTN (hypertension)    Echocardiogram 08/26/11: Mild focal basal septal hypertrophy, EF 38-25%, grade 2 diastolic dysfunction, mild MR.  There was a fluid-filled area likely in the location of the liver noted  . Hyperlipidemia   . Hyperparathyroidism 2003   parathyroid adenoma localized to the right inferior thyroid lobe region  . Hypothyroidism   . Lumbar stenosis with neurogenic claudication 2006   s/p decompression L2-L5  . Osteoporosis 2009   diagnosed by bone density scan in 2009, on Vit D only, no bisphosphonate therapy  . Phlebitis    LLE  . PVD (peripheral vascular disease) (HCC)    s/p left fem-pop bypass graft  . Renal artery stenosis (HCC)    left, 40%  . TIA (transient ischemic attack)    carotid dopplers 5/13: no ICA stenosis    Past Surgical History:  Procedure Laterality Date  . APPENDECTOMY    . BACK SURGERY  06/2004   L3, L4 lami; L2, L5 hemilami; decompresion L2-3, 3-4, 4-5  . CORONARY ANGIOPLASTY WITH STENT PLACEMENT  09/2008  . CORONARY ANGIOPLASTY WITH STENT PLACEMENT  04/2001  . FEMORAL BYPASS  before 2003   LLE  . INCISION AND DRAINAGE ANTERIOR NECK  10/2001   absces sternoclavicular joint    FAMHx:  Family History  Problem Relation Age of Onset  . Stroke Mother   . Stroke Father     SOCHx:  reports that she has never smoked. She has never used smokeless tobacco. She reports that she does not drink alcohol or use drugs.  ALLERGIES:  Allergies  Allergen Reactions  . Diphenhydramine Hcl Other (See Comments)    unknown  . Penicillins Other (See Comments)    unknown  . Shellfish Allergy Other (See Comments)    unknown    ROS: Pertinent items noted in HPI and remainder of comprehensive ROS otherwise negative.  HOME MEDS: No current facility-administered medications on file prior to encounter.    Current Outpatient  Prescriptions on File Prior to Encounter  Medication Sig Dispense Refill  . Ascorbic Acid (VITAMIN C WITH ROSE HIPS) 250 MG tablet Take 250 mg by mouth daily.    Marland Kitchen aspirin 81 MG tablet Take 1 tablet (81 mg total) by mouth daily.    Marland Kitchen b complex vitamins tablet Take 1 tablet by mouth daily.    . bimatoprost (LUMIGAN) 0.03 % ophthalmic drops Apply 1 drop to eye at bedtime.     . brimonidine (ALPHAGAN) 0.15 % ophthalmic solution Place 1 drop into the right eye 2 (two) times daily. 5 mL 0  . clopidogrel (PLAVIX) 75 MG tablet Take 75 mg by mouth daily.    . Coenzyme Q10 (CO Q-10) 50 MG CAPS Take 1 capsule by mouth daily.      . Cyanocobalamin (VITAMIN B-12) 5000 MCG SUBL Place 1 tablet under the tongue daily.      . dorzolamide (TRUSOPT) 2 % ophthalmic solution Place 1 drop into both eyes 2 (two) times daily.    . metoprolol succinate (TOPROL-XL) 50 MG 24 hr tablet TAKE ONE TABLET BY MOUTH EVERY DAY 30 tablet 6  . nitroGLYCERIN (NITROSTAT) 0.4 MG SL tablet Place 1 tablet (0.4 mg total) under the tongue every 5 (five) minutes as needed. For chest pain 25 tablet 2  . ramipril (ALTACE) 10 MG capsule TAKE ONE CAPSULE BY MOUTH EVERY DAY 30 capsule 6  . timolol (BETIMOL) 0.5 % ophthalmic solution Place 1 drop into both eyes 2 (two) times daily.    . traMADol (ULTRAM) 50 MG tablet Take 1-2 tablets (50-100 mg total) by mouth 2 (two) times daily. Take 2 tablets every morning and take 1 tablet every evening 30 tablet 0  . [DISCONTINUED] simvastatin (ZOCOR) 40 MG tablet Take 1 tablet (40 mg total) by mouth at bedtime. 30 tablet 0    LABS/IMAGING: No results found for this or any previous visit (from the past 48 hour(s)). No results found.  VITALS: Vitals:   05/06/16 1930  BP: 156/79  Resp: 13    EXAM: General appearance: alert and no distress Neck: no carotid bruit and no JVD Lungs: clear to auscultation bilaterally Heart: regular rate and rhythm Abdomen: soft, non-tender; bowel sounds normal; no  masses,  no organomegaly Extremities: extremities normal, atraumatic, no cyanosis or edema Pulses: 2+ and symmetric Skin: Skin color, texture, turgor normal. No rashes or lesions Neurologic: Grossly normal Psych: Pleasant  IMPRESSION: Principal Problem:   Acute ST elevation myocardial infarction (STEMI) of inferior wall (HCC) Active Problems:   HYPERLIPIDEMIA TYPE IIB / III   HYPERTENSION, BENIGN   CAD, NATIVE VESSEL   PLAN: 1. Mrs. Yamamoto presents with acute inferior and lateral STEMI. Symptoms may have been present as early as Saturday however significantly intensified today. She has 1-1/2 mm ST elevation inferior and laterally. I reviewed the EKG along with Dr. Gwenlyn Found and he wishes for her to undergo heart catheterization. He feels that  she is quite functional and lives independently and would benefit from early percutaneous intervention. She was heparinized in the ER and taken emergently to the Cath Lab. See his cath and intervention note which will follow.  Pixie Casino, MD, [facility] Attending Cardiologist [place] H[place] 05/06/2016, 7:41 PM

## 2016-05-06 NOTE — ED Notes (Signed)
Patient seen by Dr. Gwenlyn Found at ER and transported to cathlab .

## 2016-05-06 NOTE — ED Provider Notes (Signed)
[facility] DEPT Provider Note   CSN: [id number] Arrival date & time: 05/06/16  1923     History   Chief Complaint Chief Complaint  Patient presents with  . Chest Pain    STEMI    HPI [name] is a 83 y.o. female.  The history is provided by the patient.  Chest Pain   This is a new problem. The current episode started yesterday. The problem occurs constantly. The problem has been rapidly improving. The pain is associated with exertion. The pain is present in the substernal region. The pain is at a severity of 9/10. The quality of the pain is described as exertional and heavy. The pain does not radiate. The symptoms are aggravated by exertion. Associated symptoms include shortness of breath. Pertinent negatives include no abdominal pain, no back pain, no cough, no diaphoresis, no dizziness, no fever, no nausea, no near-syncope, no palpitations and no weakness. She has tried nitroglycerin for the symptoms. The treatment provided significant relief.  Her past medical history is significant for CAD.  Procedure history is positive for cardiac catheterization.  Hx of CAD s/p PCI to the RCA and OM in 2003 and 2010. Severe pain initially, then given 2 nitro at home by family, pain went to 6/10. EMS gave 325 ASA and 1 nitro and pain now nearly resolved.  Past Medical History:  Diagnosis Date  . Abscess 2003   I&D SCM abscess  . Acute ischemic colitis ([place]) 04/10/11   acute episode of ischemic colitis, with abd pain, CT evidence of transverse colitis, and blood streaked BM.  Resolved with conservative treatment.  . Arthritis   . Bacteremia 2003   hx of staph bacteremia  . CAD (coronary artery disease) 2003, 2010   s/p stenting of RCA x2, s/p stent to OM;  LHC 04/2009 in the setting of a NSTEMI: EF 65%, RCA stent patent with 60% ISR proximal and 40% ISR mid, PLV proximal 50-60% and distal 50%, ostial circumflex 30-40%, proximal OM mild in-stent restenosis with a branch with 90+  percent ostial stenosis, proximal LAD 30-40%.  FFR of ostial circumflex:  0.96 - not flow limiting.  Medical therapy was continued.  . Degenerative disc disease   . Depression   . History of medication noncompliance    concerning Plavix  . HTN (hypertension)    Echocardiogram 08/26/11: Mild focal basal septal hypertrophy, EF 73-22%, grade 2 diastolic dysfunction, mild MR.  There was a fluid-filled area likely in the location of the liver noted  . Hyperlipidemia   . Hyperparathyroidism 2003   parathyroid adenoma localized to the right inferior thyroid lobe region  . Hypothyroidism   . Lumbar stenosis with neurogenic claudication 2006   s/p decompression L2-L5  . Osteoporosis 2009   diagnosed by bone density scan in 2009, on Vit D only, no bisphosphonate therapy  . Phlebitis    LLE  . PVD (peripheral vascular disease) (HCC)    s/p left fem-pop bypass graft  . Renal artery stenosis (HCC)    left, 40%  . TIA (transient ischemic attack)    carotid dopplers 5/13: no ICA stenosis    Patient Active Problem List   Diagnosis Date Noted  . ST elevation myocardial infarction (STEMI) ([place]) 05/06/2016  . STEMI (ST elevation myocardial infarction) ([place]) 05/06/2016  . TIA (transient ischemic attack) 08/26/2011  . Fall 08/26/2011  . Abdominal pain 04/10/2011  . Urinary tract infection 04/10/2011  . Ischemic colitis ([place]) 04/10/2011  . Depression 04/10/2011  .  CAD, NATIVE VESSEL 04/11/2008  . PVD 04/11/2008  . HYPERLIPIDEMIA TYPE IIB / III 04/06/2008  . HYPERTENSION, BENIGN 04/06/2008    Past Surgical History:  Procedure Laterality Date  . APPENDECTOMY    . BACK SURGERY  06/2004   L3, L4 lami; L2, L5 hemilami; decompresion L2-3, 3-4, 4-5  . CORONARY ANGIOPLASTY WITH STENT PLACEMENT  09/2008  . CORONARY ANGIOPLASTY WITH STENT PLACEMENT  04/2001  . FEMORAL BYPASS  before 2003   LLE  . INCISION AND DRAINAGE ANTERIOR NECK  10/2001   absces sternoclavicular joint    OB History    No data  available       Home Medications    Prior to Admission medications   Medication Sig Start Date End Date Taking? Authorizing Provider  Ascorbic Acid (VITAMIN C WITH ROSE HIPS) 250 MG tablet Take 250 mg by mouth daily.    Historical Provider, MD  aspirin 81 MG tablet Take 1 tablet (81 mg total) by mouth daily. 08/21/12   Minus Breeding, MD  b complex vitamins tablet Take 1 tablet by mouth daily.    Historical Provider, MD  bimatoprost (LUMIGAN) 0.03 % ophthalmic drops Apply 1 drop to eye at bedtime.     Historical Provider, MD  brimonidine (ALPHAGAN) 0.15 % ophthalmic solution Place 1 drop into the right eye 2 (two) times daily. 04/16/11   Hester Mates, MD  clopidogrel (PLAVIX) 75 MG tablet Take 75 mg by mouth daily. 07/12/10 03/11/14  Minus Breeding, MD  Coenzyme Q10 (CO Q-10) 50 MG CAPS Take 1 capsule by mouth daily.      Historical Provider, MD  Cyanocobalamin (VITAMIN B-12) 5000 MCG SUBL Place 1 tablet under the tongue daily.      Historical Provider, MD  dorzolamide (TRUSOPT) 2 % ophthalmic solution Place 1 drop into both eyes 2 (two) times daily.    Historical Provider, MD  metoprolol succinate (TOPROL-XL) 50 MG 24 hr tablet TAKE ONE TABLET BY MOUTH EVERY DAY 09/30/12   Minus Breeding, MD  nitroGLYCERIN (NITROSTAT) 0.4 MG SL tablet Place 1 tablet (0.4 mg total) under the tongue every 5 (five) minutes as needed. For chest pain 09/14/12   Minus Breeding, MD  ramipril (ALTACE) 10 MG capsule TAKE ONE CAPSULE BY MOUTH EVERY DAY 09/30/12   Minus Breeding, MD  timolol (BETIMOL) 0.5 % ophthalmic solution Place 1 drop into both eyes 2 (two) times daily.    Historical Provider, MD  traMADol (ULTRAM) 50 MG tablet Take 1-2 tablets (50-100 mg total) by mouth 2 (two) times daily. Take 2 tablets every morning and take 1 tablet every evening 04/16/11   Hester Mates, MD    Family History Family History  Problem Relation Age of Onset  . Stroke Mother   . Stroke Father     Social History Social History    Substance Use Topics  . Smoking status: Never Smoker  . Smokeless tobacco: Never Used  . Alcohol use No     Allergies   Diphenhydramine hcl; Penicillins; and Shellfish allergy   Review of Systems Review of Systems  Constitutional: Negative for diaphoresis and fever.  HENT: Negative.   Eyes: Negative.   Respiratory: Positive for shortness of breath. Negative for cough and wheezing.   Cardiovascular: Positive for chest pain. Negative for palpitations, leg swelling and near-syncope.  Gastrointestinal: Negative for abdominal pain and nausea.  Genitourinary: Negative.   Musculoskeletal: Negative for back pain and neck pain.  Skin: Negative.   Neurological: Negative for  dizziness, weakness and light-headedness.  All other systems reviewed and are negative.    Physical Exam Updated Vital Signs BP (!) 176/112   Pulse 67   Temp 97.5 F (36.4 C)   Resp 12   Ht 5\' 3"  (1.6 m)   Wt 47.2 kg   SpO2 100%   BMI 18.43 kg/m   Physical Exam  Constitutional: She is oriented to person, place, and time. She appears well-developed and well-nourished. She appears cachectic. No distress.  HENT:  Head: Normocephalic and atraumatic.  Eyes: Conjunctivae are normal.  Neck: Normal range of motion. Neck supple.  Cardiovascular: Normal rate, regular rhythm, S1 normal, S2 normal, normal heart sounds, intact distal pulses and normal pulses.   No murmur heard. Pulmonary/Chest: Effort normal and breath sounds normal. No respiratory distress. She has no decreased breath sounds. She has no wheezes. She has no rhonchi.  Abdominal: Soft. There is no tenderness.  Musculoskeletal: She exhibits no edema.  Neurological: She is alert and oriented to person, place, and time.  Skin: Skin is warm and dry. Capillary refill takes less than 2 seconds.  Psychiatric: She has a normal mood and affect.  Nursing note and vitals reviewed.    ED Treatments / Results  Labs (all labs ordered are listed, but only  abnormal results are displayed) Labs Reviewed  CBC - Abnormal; Notable for the following:       Result Value   RBC 3.56 (*)    Hemoglobin 11.1 (*)    HCT 34.4 (*)    All other components within normal limits  APTT - Abnormal; Notable for the following:    aPTT 173 (*)    All other components within normal limits  COMPREHENSIVE METABOLIC PANEL - Abnormal; Notable for the following:    Glucose, Bld 143 (*)    Calcium 11.5 (*)    GFR calc non Af Amer 57 (*)    All other components within normal limits  TROPONIN I - Abnormal; Notable for the following:    Troponin I 0.21 (*)    All other components within normal limits  LIPID PANEL - Abnormal; Notable for the following:    Cholesterol 260 (*)    LDL Cholesterol 177 (*)    All other components within normal limits  POCT I-STAT TROPONIN I - Abnormal; Notable for the following:    Troponin i, poc 0.26 (*)    All other components within normal limits  MRSA PCR SCREENING  DIFFERENTIAL  PROTIME-INR  TROPONIN I  BASIC METABOLIC PANEL  CBC  TROPONIN I  I-STAT TROPOININ, ED    EKG  EKG Interpretation None       Radiology No results found.  Procedures Procedures (including critical care time)  Medications Ordered in ED Medications  aspirin chewable tablet 324 mg ( Oral MAR Unhold 05/06/16 2153)  metoprolol succinate (TOPROL-XL) 24 hr tablet 50 mg (not administered)  ramipril (ALTACE) capsule 10 mg (not administered)  nitroGLYCERIN (NITROSTAT) SL tablet 0.4 mg (not administered)  sodium chloride flush (NS) 0.9 % injection 3 mL (not administered)  sodium chloride flush (NS) 0.9 % injection 3 mL (not administered)  0.9 %  sodium chloride infusion (not administered)  labetalol (NORMODYNE,TRANDATE) injection 10 mg (10 mg Intravenous Given 05/06/16 2336)  hydrALAZINE (APRESOLINE) injection 5 mg (not administered)  acetaminophen (TYLENOL) tablet 650 mg (not administered)  ondansetron (ZOFRAN) injection 4 mg (not administered)    0.9 %  sodium chloride infusion ( Intravenous New Bag/Given 05/06/16 2226)  morphine  4 MG/ML injection 2 mg (not administered)  aspirin chewable tablet 81 mg (not administered)  ticagrelor (BRILINTA) tablet 90 mg (not administered)  hydrALAZINE (APRESOLINE) injection 10 mg (not administered)  bivalirudin (ANGIOMAX) 250 mg in sodium chloride 0.9 % 50 mL (5 mg/mL) infusion (0 mg/kg/hr  49 kg Intravenous Stopped 05/06/16 2215)  heparin injection 3,000 Units (3,000 Units Intravenous Given 05/06/16 1933)  lidocaine (PF) (XYLOCAINE) 1 % injection (  Given 05/06/16 2230)  THROMBI-PAD (THROMBI-PAD) 3"X3" pad 1 each (1 each Topical Given 05/06/16 2331)     Initial Impression / Assessment and Plan / ED Course  I have reviewed the triage vital signs and the nursing notes.  Pertinent labs & imaging results that were available during my care of the patient were reviewed by me and considered in my medical decision making (see chart for details).    83 year old female with a history of CAD presenting with chest pain. Chest pain nearly resolved after nitroglycerin and aspirin. Exam is as above. EKG concerning for inferior STEMI. Code STEMI was called in the field. Dr. Debara Pickett at bedside. Patient given heparin and taken to the Cath Lab. She remained hemodynamically stable with ABC's intact while in the ED  Patient care discussed and supervised by my attending, Dr. Darl Householder. Drucie Ip, MD   Final Clinical Impressions(s) / ED Diagnoses   Final diagnoses:  ST elevation myocardial infarction (STEMI), unspecified artery [facility])    New Prescriptions Current Discharge Medication List       Brandyn Thien Mali Thanvi Blincoe, MD 05/07/16 0012    Drenda Freeze, MD 05/07/16 1058

## 2016-05-06 NOTE — Progress Notes (Signed)
Orthopedic Tech Progress Note Patient Details:  [name] [birth year] [id number]  Ortho Devices Type of Ortho Device: Knee Immobilizer Ortho Device/Splint Location: dropped off with nurse Ortho Device/Splint Interventions: Ordered, Application   Karolee Stamps 05/06/2016, 10:22 PM

## 2016-05-07 ENCOUNTER — Inpatient Hospital Stay (HOSPITAL_COMMUNITY): Payer: Medicare Other

## 2016-05-07 ENCOUNTER — Encounter (HOSPITAL_COMMUNITY): Payer: Self-pay | Admitting: Cardiovascular Disease

## 2016-05-07 LAB — POCT I-STAT, CHEM 8
BUN: 18 mg/dL (ref 6–20)
CHLORIDE: 102 mmol/L (ref 101–111)
Calcium, Ion: 1.59 mmol/L (ref 1.15–1.40)
Creatinine, Ser: 0.8 mg/dL (ref 0.44–1.00)
GLUCOSE: 149 mg/dL — AB (ref 65–99)
HEMATOCRIT: 31 % — AB (ref 36.0–46.0)
Hemoglobin: 10.5 g/dL — ABNORMAL LOW (ref 12.0–15.0)
POTASSIUM: 4.4 mmol/L (ref 3.5–5.1)
Sodium: 140 mmol/L (ref 135–145)
TCO2: 23 mmol/L (ref 0–100)

## 2016-05-07 LAB — BASIC METABOLIC PANEL
Anion gap: 8 (ref 5–15)
BUN: 21 mg/dL — AB (ref 6–20)
CHLORIDE: 104 mmol/L (ref 101–111)
CO2: 24 mmol/L (ref 22–32)
CREATININE: 0.94 mg/dL (ref 0.44–1.00)
Calcium: 10.7 mg/dL — ABNORMAL HIGH (ref 8.9–10.3)
GFR calc Af Amer: >60 mL/min (ref >60)
GFR calc non Af Amer: 53 mL/min — ABNORMAL LOW (ref >60)
Glucose, Bld: 104 mg/dL — ABNORMAL HIGH (ref 65–99)
POTASSIUM: 4.2 mmol/L (ref 3.5–5.1)
Sodium: 136 mmol/L (ref 135–145)

## 2016-05-07 LAB — CBC
HEMATOCRIT: 30.1 % — AB (ref 36.0–46.0)
Hemoglobin: 9.6 g/dL — ABNORMAL LOW (ref 12.0–15.0)
MCH: 30.7 pg (ref 26.0–34.0)
MCHC: 31.9 g/dL (ref 30.0–36.0)
MCV: 96.2 fL (ref 78.0–100.0)
PLATELETS: 160 10*3/uL (ref 150–400)
RBC: 3.13 MIL/uL — AB (ref 3.87–5.11)
RDW: 13.1 % (ref 11.5–15.5)
WBC: 9 10*3/uL (ref 4.0–10.5)

## 2016-05-07 LAB — TROPONIN I
Troponin I: 19.38 ng/mL (ref <0.03)
Troponin I: 36.91 ng/mL (ref <0.03)

## 2016-05-07 LAB — POCT ACTIVATED CLOTTING TIME
ACTIVATED CLOTTING TIME: 433 s
ACTIVATED CLOTTING TIME: 433 s
Activated Clotting Time: 186 seconds

## 2016-05-07 LAB — MRSA PCR SCREENING: MRSA by PCR: NEGATIVE

## 2016-05-07 MED ORDER — ATORVASTATIN CALCIUM 20 MG PO TABS
20.0000 mg | ORAL_TABLET | Freq: Every day | ORAL | Status: DC
  Administered 2016-05-07: 20 mg via ORAL
  Filled 2016-05-07: qty 1

## 2016-05-07 MED ORDER — ATROPINE SULFATE 1 MG/10ML IJ SOSY
PREFILLED_SYRINGE | INTRAMUSCULAR | Status: AC
  Filled 2016-05-07: qty 10

## 2016-05-07 MED FILL — Midazolam HCl Inj 2 MG/2ML (Base Equivalent): INTRAMUSCULAR | Qty: 2 | Status: AC

## 2016-05-07 MED FILL — Fentanyl Citrate Preservative Free (PF) Inj 100 MCG/2ML: INTRAMUSCULAR | Qty: 2 | Status: AC

## 2016-05-07 NOTE — Progress Notes (Signed)
Transferred in from ICU side awake and alert.

## 2016-05-07 NOTE — Care Management Note (Addendum)
Case Management Note  Patient Details  Name: [name] MRN: [id number] Date of Birth: [birth year]  Subjective/Objective:   STEMI, s/p stenting to distal RCA into PDA with DES                 Action/Plan: Discharge Planning:  NCM spoke to pt and she lives alone but has two dtr, Steward Drone # [phone] and Baker Janus who assist her at home as needed. Her dtr Felecia takes her to MD appts and getting medication. Provided pt with Brilinta 30 day free trail card. Sent for benefits check. Left HIPAA vm for dtr to return call to follow up on Brilinta. Has RW and cane at home.   PCP PERRY, Zeb Comfort MD   PATIENT [place] MEDICAID  CO-PAY $ 3.70 FOR [facility] RX   Expected Discharge Date:                 Expected Discharge Plan:  Home/Self Care  In-House Referral:  NA  Discharge planning Services  CM Consult, Medication Assistance  Post Acute Care Choice:  NA Choice offered to:  NA  DME Arranged:  N/A DME Agency:  NA  HH Arranged:  NA HH Agency:  NA  Status of Service:  In process, will continue to follow  If discussed at Long Length of Stay Meetings, dates discussed:    Additional Comments:  Erenest Rasher, RN 05/07/2016, 3:45 PM

## 2016-05-07 NOTE — Progress Notes (Signed)
Sheath pulled at 0146, pressure held til 0300. 2 RN's present, atropine at bedside, VSS throughout. Morphine given twice. Education given to patient with acknowledgement. Site level 1 for bruising. Will continue to monitor closely.

## 2016-05-07 NOTE — Progress Notes (Signed)
CRITICAL VALUE ALERT  Critical value received:  Trop 19.38  Date of notification:  05/07/2016  Time of notification:  3685  Critical value read back:Yes.    Nurse who received alert:  Arnetha Courser, RN  Expected value, post cath

## 2016-05-07 NOTE — Progress Notes (Signed)
CARDIAC REHAB PHASE I   PRE:  Rate/Rhythm: 110 SR  BP:  Supine: 96/57  Sitting:   Standing:    SaO2: 100%RA  MODE:  Ambulation: walked to chair ft   POST:  Rate/Rhythm: 78 SR  BP:  Supine:   Sitting: 78/53,  104/60  Standing:    SaO2: would not register 1330-1405 Gave daughter pt's stent card and explained need to put in wallet. Discussed importance of brilinta. Pt has not seen case manager yet to get card. Left MI booklet and heart healthy diet for pt to read. Assisted pt to recliner with feet up and call bell. Encouraged her to not get up by herself. Will return tomorrow to walk and do more ed. Denied dizziness or CP.   Graylon Good, RN BSN  05/07/2016 2:02 PM

## 2016-05-07 NOTE — Progress Notes (Signed)
Progress Note  Patient Name: [name] Date of Encounter: 05/07/2016  Primary Cardiologist: Cindy File MD  Subjective   Feels well this am. No chest pain or dyspnea.   Inpatient Medications    Scheduled Meds: . aspirin  324 mg Oral Once  . aspirin  81 mg Oral Daily  . metoprolol succinate  50 mg Oral Daily  . ramipril  10 mg Oral Daily  . sodium chloride flush  3 mL Intravenous Q12H  . ticagrelor  90 mg Oral BID   Continuous Infusions:  PRN Meds: sodium chloride, acetaminophen, hydrALAZINE, morphine injection, nitroGLYCERIN, ondansetron (ZOFRAN) IV, sodium chloride flush   Vital Signs    Vitals:   05/07/16 0700 05/07/16 0800 05/07/16 0811 05/07/16 1000  BP: 130/63   134/64  Pulse: 64 63    Resp: _0 Temp:   98.2 F (36.8 C)   TempSrc:   Oral   SpO2: 98% 96%  98%  Weight:      Height:        Intake/Output Summary (Last 24 hours) at 05/07/16 1119 Last data filed at 05/07/16 1000  Gross per 24 hour  Intake            822.1 ml  Output              400 ml  Net            422.1 ml   Filed Weights   05/06/16 2155 05/06/16 2210  Weight: 108 lb 0.4 oz (49 kg) 104 lb 0.9 oz (47.2 kg)    Telemetry    NSR with few PVCs and junctional beats.- Personally Reviewed  ECG    NSR - ST elevation resolved inferiorly - Personally Reviewed  Physical Exam   GEN: Thin elderly BF in NAD. No acute distress.  Neck: No JVD Cardiac: RRR, no murmurs, rubs, or gallops.  Respiratory: Clear to auscultation bilaterally. GI: Soft, nontender, non-distended  MS: No edema; No deformity. Right groin without hematoma. Neuro:  AAOx3. Psych: Normal affect  Labs    Chemistry Recent Labs Lab 05/06/16 1940 05/07/16 0838  NA 139 136  K 4.8 4.2  CL 105 104  CO2 26 24  GLUCOSE 143* 104*  BUN 16 21*  CREATININE 0.88 0.94  CALCIUM 11.5* 10.7*  PROT 7.0  --   ALBUMIN 3.9  --   AST 29  --   ALT 14  --   ALKPHOS 92  --   BILITOT 0.5  --   GFRNONAA 57* 53*    GFRAA >60 >60  ANIONGAP 8 8     Hematology Recent Labs Lab 05/06/16 1940 05/07/16 0838  WBC 7.9 9.0  RBC 3.56* 3.13*  HGB 11.1* 9.6*  HCT 34.4* 30.1*  MCV 96.6 96.2  MCH 31.2 30.7  MCHC 32.3 31.9  RDW 12.9 13.1  PLT 164 160    Cardiac Enzymes Recent Labs Lab 05/06/16 1940 05/07/16 0139 05/07/16 0838  TROPONINI 0.21* 19.38* 36.91*    Recent Labs Lab 05/06/16 1942  TROPIPOC 0.26*     BNPNo results for input(s): BNP, PROBNP in the last 168 hours.   DDimer No results for input(s): DDIMER in the last 168 hours.   Radiology    No results found.  Cardiac Studies   Procedures   Coronary Stent Intervention  Left Heart Cath and Coronary Angiography  Conclusion     Ost LM to LM lesion, 60 %stenosed.  Prox Cx to Mid  Cx lesion, 0 %stenosed.  Mid LAD lesion, 90 %stenosed.  Ost RCA to Dist RCA lesion, 100 %stenosed.  Dist RCA lesion, 95 %stenosed.  Post intervention, there is a 0% residual stenosis.  A stent was successfully placed.  There is mild left ventricular systolic dysfunction.  LV end diastolic pressure is mildly elevated.  The left ventricular ejection fraction is 50-55% by visual estimate.   [name] is a 83 y.o. female    [id number] LOCATION:  FACILITY: [facility]  PHYSICIAN: [name], M.D. 23-Sep-1925   DATE OF PROCEDURE:  05/06/2016  DATE OF DISCHARGE:     CARDIAC CATHETERIZATION / PCI-DES    History obtained from chart review. [name] is an 83 year old thin and frail-appearing single African-American female who lives alone and is a patient of Dr. Rosezella [name]. She last saw him in 2015. She has a history of hypertension, hyperlipidemia and known coronary artery disease as well as peripheral vascular disease. She had stenting of her RCA and obtuse marginal branch in 03 and 06. She was In 2011, have patent stents and normal LV function. She developed chest pain last night which was on and off throughout the  night and into today. EMS was called earlier this evening. EKG showed inferior ST segment elevation. Patient somewhat improved with sublingual nitroglycerin. She presents now for urgent catheterization and potential intervention.   PROCEDURE DESCRIPTION:   The patient was brought to the second floor [facility] cath lab in the postabsorptive state. She was not premedicated . Her right groin was prepped and shaved in usual sterile fashion. Xylocaine 1% was used for local anesthesia. A 6 upgraded to 7 French sheath was inserted into the right common femoral artery using standard Seldinger technique. A 5 French right and left Judkins diagnostic catheters along with a 5 French pigtail catheter were used for selective coronary angiography and left ventriculography respectively. Isovue dye was used for the entirety of the case. Retrograde aortic, left ventricular and pullback pressures were recorded.  The patient received 180 mg by mouth Brilenta, Angiomax bolus and infusion with a therapeutic ACT. Isovue dye was used for the entirety of the intervention. Retrograde aortic pressure was monitored during the case. Using a 6 [place] guide catheter along with a 0.14 cm Pro water guidewire and a 2 mm balloon the proximal dominant RCA occlusion was crossed fairly easily and the wire was placed in the PDA. I then performed balloon inflation of the proximal stented segment restoring antegrade flow. The door to balloon time was 25 minutes. I placed a 2.25 x 18 mm long Medtronic Onyx drug-eluting stent in the distal RCA into the proximal PDA and deployed at 14 atm. I then postdilated the entire previously stented segment with a 2.75 mm x 20 mm long noncompliant balloon up to 14 atm throughout the entirety of the stented segment resulting in reduction of proximal total occlusion with grade 2-3 left-to-right collaterals to 0% residual TIMI-3 flow. The patient's pain resolved as did her ST segments. Her EF was  preserved at 50-55 with inferobasal hypokinesia. She did receive Aggrastat bolus because of moderate thrombus burden but this was discontinued because of "oozing " around her sheath which ultimately was operated him a 6 [place] to a 7 [place] sheath.    IMPRESSION: Successful PCI and drug-eluting stent of the distal dominant RCA into the PDA for "inferior STEMI with a door to balloon time of 25 minutes. There was a moderate thrombus burden. I postdilated the entire stented  segment in the proximal RCA down to the distal RCA. The patient did have 60% distal left main disease as well as high grade segmental mid LAD which given her age will be treated medically. We will continue dual antibiotic therapy. Angiomax continued for 4 hours and then be discontinued. A 2-D echo will be obtained. The patient left the lab in stable condition.  [name]. MD, [facility] 05/06/2016 8:57 PM      Indications   ST elevation myocardial infarction (STEMI), unspecified artery ([place]) [I21.3 (ICD-10-CM)]  Coronary artery disease due to lipid rich plaque [I25.10, I25.83 (ICD-10-CM)]  Acute ST elevation myocardial infarction (STEMI) involving other coronary artery of inferior wall (HCC) [I21.19 (ICD-10-CM)]  Procedural Details/Technique   Technical Details Estimated blood loss <50 mL.  During this procedure no sedation was administered.    Coronary Findings   Dominance: Right  Left Main  Ost LM to LM lesion, 60% stenosed.  Left Anterior Descending  Mid LAD lesion, 90% stenosed.  Left Circumflex  Prox Cx to Mid Cx lesion, 0% stenosed. Prox Cx to Mid Cx lesion with no stenosis was previously treated.  Right Coronary Artery  Ost RCA to Dist RCA lesion, 100% stenosed. The lesion was previously treated.  Angioplasty: A stent was successfully placed.  There is no residual stenosis post intervention.  Dist RCA lesion, 95% stenosed.  Angioplasty: A stent was successfully placed.  There is no residual  stenosis post intervention.  Right Posterior Descending Artery  RPDA filled by collaterals from Dist LAD.  Wall Motion              Left Heart   Left Ventricle The left ventricular size is normal. There is mild left ventricular systolic dysfunction. LV end diastolic pressure is mildly elevated. The left ventricular ejection fraction is 50-55% by visual estimate. There are LV function abnormalities. Moderate inferobasal hypokinesia, preserved overall LV function    Coronary Diagrams   Diagnostic Diagram     Post-Intervention Diagram        Patient Profile     83 y.o. female with known history of CAD s/p stenting of RCA and OM in past. Presents with acute inferior STEMI  Assessment & Plan    1. Acute inferior STEMI. Secondary to RCA occlusion. S/p stenting of distal RCA into PDA with DES. Remainder of RCA treated with POBA. On metoprolol, ACEi, DAPT. Will start statin. Patient does have residual disease in mid LAD up to 90%. This is a heavily calcified vessel. I don't think the left main disease is significant. Needs to recover from STEMI. Consideration whether to treat the LAD at some point. This is a complex lesion and would require atherectomy. For now will manage medically but if she has recurrent angina it would be reasonable to consider PCI of LAD 2. HTN controlled 3. Hyperlipidemia. Will start lipitor 20 mg daily given age. 4. PAD s/p fem-pop bypass.  Signed, Desean Heemstra [place], MD  05/07/2016, 11:19 AM

## 2016-05-07 NOTE — CV Procedure (Signed)
Unable to perform echocardiogram due to room maintenance.   Darlina Sicilian RDCS

## 2016-05-08 ENCOUNTER — Inpatient Hospital Stay (HOSPITAL_COMMUNITY): Payer: Medicare Other

## 2016-05-08 DIAGNOSIS — I2510 Atherosclerotic heart disease of native coronary artery without angina pectoris: Secondary | ICD-10-CM

## 2016-05-08 LAB — ECHOCARDIOGRAM COMPLETE
Height: 63 in
WEIGHTICAEL: 1664.91 [oz_av]

## 2016-05-08 MED ORDER — ATORVASTATIN CALCIUM 40 MG PO TABS
40.0000 mg | ORAL_TABLET | Freq: Every day | ORAL | Status: DC
  Administered 2016-05-08: 40 mg via ORAL
  Filled 2016-05-08: qty 1

## 2016-05-08 NOTE — Progress Notes (Signed)
CARDIAC REHAB PHASE I   PRE:  Rate/Rhythm: 68 SR    BP: sitting 90/51    SaO2: 100 RA  MODE:  Ambulation: 230 ft   POST:  Rate/Rhythm: 95 SR    BP: sitting 114/62     SaO2: 100 RA  Tolerated fairly well with RW. Steady, slow pace. Used gait belt but no real assist needed. To recliner with VSS. She lives alone with family checking in daily. She mostly uses a cane but has RW at home.  Her granddaughter will be here through Sunday. Suggest HHPT for safety eval at home. Ed completed with pt and granddaughter. Understands Brilinta, activity, NTG, and CRPII. Will refer to [place] however pt prob not interested due to travel. She sts she will walk at home. [place], ACSM 05/08/2016 11:33 AM

## 2016-05-08 NOTE — Progress Notes (Signed)
Progress Note  Patient Name: [name] Date of Encounter: 05/08/2016  Primary Cardiologist: Marijo File MD  Subjective   Feels well this am. No chest pain or dyspnea.   Inpatient Medications    Scheduled Meds: . aspirin  81 mg Oral Daily  . atorvastatin  20 mg Oral q1800  . metoprolol succinate  50 mg Oral Daily  . ramipril  10 mg Oral Daily  . sodium chloride flush  3 mL Intravenous Q12H  . ticagrelor  90 mg Oral BID   Continuous Infusions:  PRN Meds: sodium chloride, acetaminophen, hydrALAZINE, morphine injection, nitroGLYCERIN, ondansetron (ZOFRAN) IV, sodium chloride flush   Vital Signs    Vitals:   05/08/16 0400 05/08/16 0429 05/08/16 0800 05/08/16 0817  BP:  128/65  113/61  Pulse: (!) 58 62 64 65  Resp: 14 13 11 13   Temp: 98.9 F (37.2 C)   99.1 F (37.3 C)  TempSrc: Oral   Oral  SpO2: 94% 96% 95% 97%  Weight:      Height:        Intake/Output Summary (Last 24 hours) at 05/08/16 0829 Last data filed at 05/08/16 0020  Gross per 24 hour  Intake              240 ml  Output                0 ml  Net              240 ml   Filed Weights   05/06/16 2155 05/06/16 2210  Weight: 108 lb 0.4 oz (49 kg) 104 lb 0.9 oz (47.2 kg)    Telemetry    NSR with few PVCs , one 5 beat run of NSVT last pm- Personally Reviewed  ECG    NSR - ST elevation resolved inferiorly - Personally Reviewed  Physical Exam   GEN: Thin elderly BF in NAD. No acute distress.  Neck: No JVD Cardiac: RRR, no murmurs, rubs, or gallops.  Respiratory: Clear to auscultation bilaterally. GI: Soft, nontender, non-distended  MS: No edema; No deformity. Right groin without hematoma. Neuro:  AAOx3. Psych: Normal affect  Labs    Chemistry  Recent Labs Lab 05/06/16 1940 05/06/16 2001 05/07/16 0838  NA 139 140 136  K 4.8 4.4 4.2  CL 105 102 104  CO2 26  --  24  GLUCOSE 143* 149* 104*  BUN 16 18 21*  CREATININE 0.88 0.80 0.94  CALCIUM 11.5*  --  10.7*  PROT 7.0  --   --     ALBUMIN 3.9  --   --   AST 29  --   --   ALT 14  --   --   ALKPHOS 92  --   --   BILITOT 0.5  --   --   GFRNONAA 57*  --  53*  GFRAA >60  --  >60  ANIONGAP 8  --  8     Hematology  Recent Labs Lab 05/06/16 1940 05/06/16 2001 05/07/16 0838  WBC 7.9  --  9.0  RBC 3.56*  --  3.13*  HGB 11.1* 10.5* 9.6*  HCT 34.4* 31.0* 30.1*  MCV 96.6  --  96.2  MCH 31.2  --  30.7  MCHC 32.3  --  31.9  RDW 12.9  --  13.1  PLT 164  --  160    Cardiac Enzymes  Recent Labs Lab 05/06/16 1940 05/07/16 0139 05/07/16 0838  TROPONINI 0.21* 19.38*  36.91*     Recent Labs Lab 05/06/16 1942  TROPIPOC 0.26*     BNPNo results for input(s): BNP, PROBNP in the last 168 hours.   DDimer No results for input(s): DDIMER in the last 168 hours.   Radiology    Dg Chest 2 View  Result Date: 05/07/2016 CLINICAL DATA:  STEMI. EXAM: CHEST  2 VIEW COMPARISON:  05/05/2009 FINDINGS: There is hyperinflation of the lungs compatible with COPD. Mild cardiomegaly. No confluent opacities or effusions. No acute bony abnormality. IMPRESSION: COPD, cardiomegaly.  No active disease. Electronically Signed   By: Rolm Baptise M.D.   On: 05/07/2016 17:04    Cardiac Studies   Procedures   Coronary Stent Intervention  Left Heart Cath and Coronary Angiography  Conclusion     Ost LM to LM lesion, 60 %stenosed.  Prox Cx to Mid Cx lesion, 0 %stenosed.  Mid LAD lesion, 90 %stenosed.  Ost RCA to Dist RCA lesion, 100 %stenosed.  Dist RCA lesion, 95 %stenosed.  Post intervention, there is a 0% residual stenosis.  A stent was successfully placed.  There is mild left ventricular systolic dysfunction.  LV end diastolic pressure is mildly elevated.  The left ventricular ejection fraction is 50-55% by visual estimate.   [name] is a 83 y.o. female    [id number] LOCATION:  FACILITY: [facility]  PHYSICIAN: Quay Burow, M.D. 12-Apr-1926   DATE OF PROCEDURE:  05/06/2016  DATE OF  DISCHARGE:     CARDIAC CATHETERIZATION / PCI-DES    History obtained from chart review. Ms. Boye is an 83 year old thin and frail-appearing single African-American female who lives alone and is a patient of Dr. Rosezella [place]. She last saw him in 2015. She has a history of hypertension, hyperlipidemia and known coronary artery disease as well as peripheral vascular disease. She had stenting of her RCA and obtuse marginal branch in 03 and 06. She was In 2011, have patent stents and normal LV function. She developed chest pain last night which was on and off throughout the night and into today. EMS was called earlier this evening. EKG showed inferior ST segment elevation. Patient somewhat improved with sublingual nitroglycerin. She presents now for urgent catheterization and potential intervention.   PROCEDURE DESCRIPTION:   The patient was brought to the second floor [facility]iac cath lab in the postabsorptive state. She was not premedicated . Her right groin was prepped and shaved in usual sterile fashion. Xylocaine 1% was used for local anesthesia. A 6 upgraded to 7 French sheath was inserted into the right common femoral artery using standard Seldinger technique. A 5 French right and left Judkins diagnostic catheters along with a 5 French pigtail catheter were used for selective coronary angiography and left ventriculography respectively. Isovue dye was used for the entirety of the case. Retrograde aortic, left ventricular and pullback pressures were recorded.  The patient received 180 mg by mouth Brilenta, Angiomax bolus and infusion with a therapeutic ACT. Isovue dye was used for the entirety of the intervention. Retrograde aortic pressure was monitored during the case. Using a 6 [place] guide catheter along with a 0.14 cm Pro water guidewire and a 2 mm balloon the proximal dominant RCA occlusion was crossed fairly easily and the wire was placed in the PDA. I then performed  balloon inflation of the proximal stented segment restoring antegrade flow. The door to balloon time was 25 minutes. I placed a 2.25 x 18 mm long Medtronic Onyx drug-eluting stent in the distal RCA  into the proximal PDA and deployed at 14 atm. I then postdilated the entire previously stented segment with a 2.75 mm x 20 mm long noncompliant balloon up to 14 atm throughout the entirety of the stented segment resulting in reduction of proximal total occlusion with grade 2-3 left-to-right collaterals to 0% residual TIMI-3 flow. The patient's pain resolved as did her ST segments. Her EF was preserved at 50-55 with inferobasal hypokinesia. She did receive Aggrastat bolus because of moderate thrombus burden but this was discontinued because of "oozing " around her sheath which ultimately was operated him a 6 [place] to a 7 [place] sheath.    IMPRESSION: Successful PCI and drug-eluting stent of the distal dominant RCA into the PDA for "inferior STEMI with a door to balloon time of 25 minutes. There was a moderate thrombus burden. I postdilated the entire stented segment in the proximal RCA down to the distal RCA. The patient did have 60% distal left main disease as well as high grade segmental mid LAD which given her age will be treated medically. We will continue dual antibiotic therapy. Angiomax continued for 4 hours and then be discontinued. A 2-D echo will be obtained. The patient left the lab in stable condition.  Quay Burow. MD, [facility] 05/06/2016 8:57 PM      Indications   ST elevation myocardial infarction (STEMI), unspecified artery ([place]) [I21.3 (ICD-10-CM)]  Coronary artery disease due to lipid rich plaque [I25.10, I25.83 (ICD-10-CM)]  Acute ST elevation myocardial infarction (STEMI) involving other coronary artery of inferior wall (HCC) [I21.19 (ICD-10-CM)]  Procedural Details/Technique   Technical Details Estimated blood loss <50 mL.  During this procedure no sedation was  administered.    Coronary Findings   Dominance: Right  Left Main  Ost LM to LM lesion, 60% stenosed.  Left Anterior Descending  Mid LAD lesion, 90% stenosed.  Left Circumflex  Prox Cx to Mid Cx lesion, 0% stenosed. Prox Cx to Mid Cx lesion with no stenosis was previously treated.  Right Coronary Artery  Ost RCA to Dist RCA lesion, 100% stenosed. The lesion was previously treated.  Angioplasty: A stent was successfully placed.  There is no residual stenosis post intervention.  Dist RCA lesion, 95% stenosed.  Angioplasty: A stent was successfully placed.  There is no residual stenosis post intervention.  Right Posterior Descending Artery  RPDA filled by collaterals from Dist LAD.  Wall Motion              Left Heart   Left Ventricle The left ventricular size is normal. There is mild left ventricular systolic dysfunction. LV end diastolic pressure is mildly elevated. The left ventricular ejection fraction is 50-55% by visual estimate. There are LV function abnormalities. Moderate inferobasal hypokinesia, preserved overall LV function    Coronary Diagrams   Diagnostic Diagram     Post-Intervention Diagram        Patient Profile     83 y.o. female with known history of CAD s/p stenting of RCA and OM in past. Presents with acute inferior STEMI  Assessment & Plan    1. Acute inferior STEMI. Secondary to RCA occlusion. S/p stenting of distal RCA into PDA with DES. Remainder of RCA treated with POBA. On metoprolol, ACEi, DAPT. Will start statin. Patient does have residual disease in mid LAD up to 90%. This is a heavily calcified vessel. I don't think the left main disease is significant. Needs to recover from STEMI. LAD disease is a complex lesion and would require atherectomy. Given  her age  will manage medically but if she has recurrent angina it would be reasonable to consider PCI of LAD 2. HTN controlled 3. Hyperlipidemia. Will start lipitor 40 mg daily. LDL 177. She  doesn't know why she hasn't been taking cholesterol lowering therapy. 4. PAD s/p fem-pop bypass.  Signed, Klaryssa Fauth [place], MD  05/08/2016, 8:29 AM

## 2016-05-08 NOTE — Progress Notes (Signed)
Patient admitted to 2W37, A&Ox4, VSS. Tele applied and CCMD notified. Patient oriented to room and call light placed within reach. No questions or concerns at this time.

## 2016-05-08 NOTE — Progress Notes (Signed)
Echocardiogram 2D Echocardiogram has been performed.  [name] 05/08/2016, 1:26 PM

## 2016-05-09 MED ORDER — TICAGRELOR 90 MG PO TABS: 90.0000 mg | ORAL_TABLET | Freq: Two times a day (BID) | ORAL | 12 refills | Status: DC

## 2016-05-09 MED ORDER — ATORVASTATIN CALCIUM 40 MG PO TABS: 40.0000 mg | ORAL_TABLET | Freq: Every day | ORAL | 12 refills | Status: DC

## 2016-05-09 MED ORDER — RAMIPRIL 5 MG PO CAPS: 5.0000 mg | ORAL_CAPSULE | Freq: Every day | ORAL | Status: DC

## 2016-05-09 MED ORDER — TICAGRELOR 90 MG PO TABS: 90.0000 mg | ORAL_TABLET | Freq: Two times a day (BID) | ORAL | 0 refills | Status: DC

## 2016-05-09 NOTE — Progress Notes (Signed)
Progress Note  Patient Name: [name] Date of Encounter: 05/09/2016  Primary Cardiologist: Dr. Percival Spanish  Subjective   Says she feels weak today. No problems ambulating yesterday, hasn't gotten up yet this morning. Denies chest pain and SOB.   Inpatient Medications    Scheduled Meds: . aspirin  81 mg Oral Daily  . atorvastatin  40 mg Oral q1800  . metoprolol succinate  50 mg Oral Daily  . ramipril  10 mg Oral Daily  . sodium chloride flush  3 mL Intravenous Q12H  . ticagrelor  90 mg Oral BID   Continuous Infusions:  PRN Meds: sodium chloride, acetaminophen, morphine injection, nitroGLYCERIN, ondansetron (ZOFRAN) IV, sodium chloride flush   Vital Signs    Vitals:   05/08/16 1605 05/08/16 1739 05/08/16 1959 05/09/16 0609  BP: (!) 97/56 (!) 101/56 (!) 99/51 (!) 108/52  Pulse: 63 65 67 63  Resp: 17 18 18 18   Temp:  98.4 F (36.9 C) 98.9 F (37.2 C) 98.2 F (36.8 C)  TempSrc:  Oral Oral Oral  SpO2: 100% 100% 100% 100%  Weight:  103 lb (46.7 kg)    Height:        Intake/Output Summary (Last 24 hours) at 05/09/16 0858 Last data filed at 05/08/16 1000  Gross per 24 hour  Intake              240 ml  Output                0 ml  Net              240 ml   Filed Weights   05/06/16 2155 05/06/16 2210 05/08/16 1739  Weight: 108 lb 0.4 oz (49 kg) 104 lb 0.9 oz (47.2 kg) 103 lb (46.7 kg)    Telemetry    NSR - Personally Reviewed  ECG    NSR - ST elevation resolved inferiorly  - Personally Reviewed  Physical Exam   GEN: No acute distress,  Neck: No JVD Cardiac: RRR, no murmurs, rubs, or gallops.  Respiratory: Clear to auscultation bilaterally.  GI: Soft, nontender, non-distended  MS: No edema; No deformity. Right groin stable.  Neuro:  AAOx3. Psych: Normal affect  Labs    Chemistry Recent Labs Lab 05/06/16 1940 05/06/16 2001 05/07/16 0838  NA 139 140 136  K 4.8 4.4 4.2  CL 105 102 104  CO2 26  --  24  GLUCOSE 143* 149* 104*  BUN 16 18 21*    CREATININE 0.88 0.80 0.94  CALCIUM 11.5*  --  10.7*  PROT 7.0  --   --   ALBUMIN 3.9  --   --   AST 29  --   --   ALT 14  --   --   ALKPHOS 92  --   --   BILITOT 0.5  --   --   GFRNONAA 57*  --  53*  GFRAA >60  --  >60  ANIONGAP 8  --  8     Hematology Recent Labs Lab 05/06/16 1940 05/06/16 2001 05/07/16 0838  WBC 7.9  --  9.0  RBC 3.56*  --  3.13*  HGB 11.1* 10.5* 9.6*  HCT 34.4* 31.0* 30.1*  MCV 96.6  --  96.2  MCH 31.2  --  30.7  MCHC 32.3  --  31.9  RDW 12.9  --  13.1  PLT 164  --  160    Cardiac Enzymes Recent Labs Lab 05/06/16 1940 05/07/16  0139 05/07/16 0838  TROPONINI 0.21* 19.38* 36.91*    Recent Labs Lab 05/06/16 1942  TROPIPOC 0.26*     BNPNo results for input(s): BNP, PROBNP in the last 168 hours.   DDimer No results for input(s): DDIMER in the last 168 hours.   Radiology    Dg Chest 2 View  Result Date: 05/07/2016 CLINICAL DATA:  STEMI. EXAM: CHEST  2 VIEW COMPARISON:  05/05/2009 FINDINGS: There is hyperinflation of the lungs compatible with COPD. Mild cardiomegaly. No confluent opacities or effusions. No acute bony abnormality. IMPRESSION: COPD, cardiomegaly.  No active disease. Electronically Signed   By: Rolm Baptise M.D.   On: 05/07/2016 17:04    Cardiac Studies  Procedures   Coronary Stent Intervention  Left Heart Cath and Coronary Angiography  Conclusion     Ost LM to LM lesion, 60 %stenosed.  Prox Cx to Mid Cx lesion, 0 %stenosed.  Mid LAD lesion, 90 %stenosed.  Ost RCA to Dist RCA lesion, 100 %stenosed.  Dist RCA lesion, 95 %stenosed.  Post intervention, there is a 0% residual stenosis.  A stent was successfully placed.  There is mild left ventricular systolic dysfunction.  LV end diastolic pressure is mildly elevated.  The left ventricular ejection fraction is 50-55% by visual estimate.  Cindy Howson Hookeris a 83 y.o.female   [id number] LOCATION: FACILITY: [facility]  PHYSICIAN: Quay Burow,  M.D. [birth year]   DATE OF PROCEDURE: 05/06/2016  DATE OF DISCHARGE:     CARDIAC CATHETERIZATION/ PCI-DES    History obtained from chart review. [name] is an 83 year old thin and frail-appearing single African-American female who lives alone and is a patient of Dr. Rosezella [place]. She last saw him in 2015. She has a history of hypertension, hyperlipidemia and known coronary artery disease as well as peripheral vascular disease. She had stenting of her RCA and obtuse marginal branch in 03 and 06. She was In 2011, have patent stents and normal LV function. She developed chest pain last night which was on and off throughout the night and into today. EMS was called earlier this evening. EKG showed inferior ST segment elevation. Patient somewhat improved with sublingual nitroglycerin. She presents now for urgent catheterization and potential intervention.   PROCEDURE DESCRIPTION:  The patient was brought to the second floor [place] [facility]iac cath lab in the postabsorptive state. She was not premedicated . Her right groin was prepped and shaved in usual sterile fashion. Xylocaine 1% was used for local anesthesia. A 6 upgraded to 7 French sheath was inserted into the right common femoral artery using standard Seldinger technique. A 5 French right and left Judkins diagnostic catheters along with a 5 French pigtail catheter were used for selective coronary angiography and left ventriculography respectively. Isovue dye was used for the entirety of the case. Retrograde aortic, left ventricular and pullback pressures were recorded.  The patient received 180 mg by mouth Brilenta, Angiomax bolus and infusion with a therapeutic ACT. Isovue dye was used for the entirety of the intervention. Retrograde aortic pressure was monitored during the case. Using a 6 [place] guide catheter along with a 0.14 cm Pro water guidewire and a 2 mm balloon the proximal dominant RCA occlusion was crossed  fairly easily and the wire was placed in the PDA. I then performed balloon inflation of the proximal stented segment restoring antegrade flow. The door to balloon time was 25 minutes. I placed a 2.25 x 18 mm long Medtronic Onyx drug-eluting stent in the distal RCA into the proximal  PDA and deployed at 14 atm. I then postdilated the entire previously stented segment with a 2.75 mm x 20 mm long noncompliant balloon up to 14 atm throughout the entirety of the stented segment resulting in reduction of proximal total occlusion with grade 2-3 left-to-right collaterals to 0% residual TIMI-3 flow. The patient's pain resolved as did her ST segments. Her EF was preserved at 50-55 with inferobasal hypokinesia. She did receive Aggrastat bolus because of moderate thrombus burden but this was discontinued because of "oozing " around her sheath which ultimately was operated him a 6 [place] to a 7 [place] sheath.    IMPRESSION:Successful PCI and drug-eluting stent of the distal dominant RCA into the PDA for "inferior STEMI with adoor to balloon time of 25 minutes. There was a moderate thrombus burden. I postdilated the entire stented segment in the proximal RCA down to the distal RCA. The patient did have 60% distal left main disease as well as high grade segmental mid LAD which given her age will be treated medically. We will continue dual antibiotic therapy. Angiomax continued for 4 hours and then be discontinued. A 2-D echo will be obtained. The patient left the lab in stable condition.  Quay Burow. MD, [facility] 05/06/2016 8:57 PM      Indications   ST elevation myocardial infarction (STEMI), unspecified artery ([place]) [I21.3 (ICD-10-CM)]  Coronary artery disease due to lipid rich plaque [I25.10, I25.83 (ICD-10-CM)]  Acute ST elevation myocardial infarction (STEMI) involving other coronary artery of inferior wall (HCC) [I21.19 (ICD-10-CM)]  Procedural Details/Technique   Technical Details Estimated  blood loss <50 mL.  During this procedure no sedation was administered.    Coronary Findings   Dominance: Right  Left Main  Ost LM to LM lesion, 60% stenosed.  Left Anterior Descending  Mid LAD lesion, 90% stenosed.  Left Circumflex  Prox Cx to Mid Cx lesion, 0% stenosed. Prox Cx to Mid Cx lesion with no stenosis was previously treated.  Right Coronary Artery  Ost RCA to Dist RCA lesion, 100% stenosed. The lesion was previously treated.  Angioplasty: A stent was successfully placed.  There is no residual stenosis post intervention.  Dist RCA lesion, 95% stenosed.  Angioplasty: A stent was successfully placed.  There is no residual stenosis post intervention.  Right Posterior Descending Artery  RPDA filled by collaterals from Dist LAD.  Wall Motion              Left Heart   Left Ventricle The left ventricular size is normal. There is mild left ventricular systolic dysfunction. LV end diastolic pressure is mildly elevated. The left ventricular ejection fraction is 50-55% by visual estimate. There are LV function abnormalities. Moderate inferobasal hypokinesia, preserved overall LV function    Coronary Diagrams   Diagnostic Diagram     Post-Intervention Diagram          Patient Profile     83 y.o. female with known history of CAD s/p stenting of RCA and OM in past. Presents with acute inferior STEMI  Assessment & Plan    1. Acute inferior STEMI. Secondary to RCA occlusion. S/p stenting of distal RCA into PDA with DES. Remainder of RCA treated with POBA. On metoprolol, ACEi, DAPT. Will start statin. Patient does have residual disease in mid LAD up to 90%. This is a heavily calcified vessel. Per Dr. Martinique -  I don't think the left main disease is significant. Needs to recover from STEMI. LAD disease is a complex lesion and would require  atherectomy. Given her age  will manage medically but if she has recurrent angina it would be reasonable to consider PCI of  LAD 2. HTN controlled 3. Hyperlipidemia. Will start lipitor 40 mg daily. LDL 177. She doesn't know why she hasn't been taking cholesterol lowering therapy. 4. PAD s/p fem-pop bypass.  MD to advise on discharge.    Signed, Arbutus Leas, NP  05/09/2016, 8:58 AM   Patient seen and examined and history reviewed. Agree with above findings and plan. Feels well. No chest pain. No dizziness. BP is a little soft so will reduce Altace to 5 mg daily. Ambulate with Cardiac Rehab today. I think she is ready for DC home today.  Jennilyn Esteve Martinique, [place] 05/09/2016 10:12 AM

## 2016-05-09 NOTE — Discharge Summary (Signed)
Discharge Summary    Patient ID: [name],  MRN: [id number], DOB/AGE: 1926/02/09 83 y.o.  Admit date: 05/06/2016 Discharge date: 05/09/2016  Primary Care Provider: PERRY,LAWRENCE EDWARD Primary Cardiologist: Dr. Percival Spanish   Discharge Diagnoses    Principal Problem:   ST elevation myocardial infarction (STEMI) [facility]) Active Problems:   HYPERLIPIDEMIA TYPE IIB / III   HYPERTENSION, BENIGN   CAD, NATIVE VESSEL   STEMI (ST elevation myocardial infarction) ([place])   Allergies Allergies  Allergen Reactions  . Diphenhydramine Hcl Other (See Comments)    unknown  . Penicillins Other (See Comments)    unknown  . Shellfish Allergy Other (See Comments)    unknown    Diagnostic Studies/Procedures    Procedures   Coronary Stent Intervention  Left Heart Cath and Coronary Angiography  Conclusion     Ost LM to LM lesion, 60 %stenosed.  Prox Cx to Mid Cx lesion, 0 %stenosed.  Mid LAD lesion, 90 %stenosed.  Ost RCA to Dist RCA lesion, 100 %stenosed.  Dist RCA lesion, 95 %stenosed.  Post intervention, there is a 0% residual stenosis.  A stent was successfully placed.  There is mild left ventricular systolic dysfunction.  LV end diastolic pressure is mildly elevated.  The left ventricular ejection fraction is 50-55% by visual estimate.  Cindy Osuna Hookeris a 83 y.o.female   [id number] LOCATION: FACILITY: [facility]  PHYSICIAN: Quay Burow, M.D. 1926-03-19   DATE OF PROCEDURE: 05/06/2016  DATE OF DISCHARGE:     CARDIAC CATHETERIZATION/ PCI-DES    History obtained from chart review. [name] is an 83 year old thin and frail-appearing single African-American female who lives alone and is a patient of Dr. Rosezella [place]. She last saw him in 2015. She has a history of hypertension, hyperlipidemia and known coronary artery disease as well as peripheral vascular disease. She had stenting of her RCA and obtuse marginal branch in 03 and 06. She  was In 2011, have patent stents and normal LV function. She developed chest pain last night which was on and off throughout the night and into today. EMS was called earlier this evening. EKG showed inferior ST segment elevation. Patient somewhat improved with sublingual nitroglycerin. She presents now for urgent catheterization and potential intervention.   PROCEDURE DESCRIPTION:  The patient was brought to the second floor [facility]iac cath lab in the postabsorptive state. She was not premedicated . Her right groin was prepped and shaved in usual sterile fashion. Xylocaine 1% was used for local anesthesia. A 6 upgraded to 7 French sheath was inserted into the right common femoral artery using standard Seldinger technique. A 5 French right and left Judkins diagnostic catheters along with a 5 French pigtail catheter were used for selective coronary angiography and left ventriculography respectively. Isovue dye was used for the entirety of the case. Retrograde aortic, left ventricular and pullback pressures were recorded.  The patient received 180 mg by mouth Brilenta, Angiomax bolus and infusion with a therapeutic ACT. Isovue dye was used for the entirety of the intervention. Retrograde aortic pressure was monitored during the case. Using a 6 [place] guide catheter along with a 0.14 cm Pro water guidewire and a 2 mm balloon the proximal dominant RCA occlusion was crossed fairly easily and the wire was placed in the PDA. I then performed balloon inflation of the proximal stented segment restoring antegrade flow. The door to balloon time was 25 minutes. I placed a 2.25 x 18 mm long Medtronic Onyx drug-eluting stent in the  distal RCA into the proximal PDA and deployed at 14 atm. I then postdilated the entire previously stented segment with a 2.75 mm x 20 mm long noncompliant balloon up to 14 atm throughout the entirety of the stented segment resulting in reduction of proximal total occlusion with  grade 2-3 left-to-right collaterals to 0% residual TIMI-3 flow. The patient's pain resolved as did her ST segments. Her EF was preserved at 50-55 with inferobasal hypokinesia. She did receive Aggrastat bolus because of moderate thrombus burden but this was discontinued because of "oozing " around her sheath which ultimately was operated him a 6 [place] to a 7 [place] sheath.    IMPRESSION:Successful PCI and drug-eluting stent of the distal dominant RCA into the PDA for "inferior STEMI with adoor to balloon time of 25 minutes. There was a moderate thrombus burden. I postdilated the entire stented segment in the proximal RCA down to the distal RCA. The patient did have 60% distal left main disease as well as high grade segmental mid LAD which given her age 83 will be treated medically. We will continue dual antibiotic therapy. Angiomax continued for 4 hours and then be discontinued. A 2-D echo will be obtained. The patient left the lab in stable condition.  Quay Burow. MD, [facility] 05/06/2016 8:57 PM      Indications   ST elevation myocardial infarction (STEMI), unspecified artery ([place]) [I21.3 (ICD-10-CM)]  Coronary artery disease due to lipid rich plaque [I25.10, I25.83 (ICD-10-CM)]  Acute ST elevation myocardial infarction (STEMI) involving other coronary artery of inferior wall (HCC) [I21.19 (ICD-10-CM)]  Procedural Details/Technique   Technical Details Estimated blood loss <50 mL.  During this procedure no sedation was administered.    Coronary Findings   Dominance: Right  Left Main  Ost LM to LM lesion, 60% stenosed.  Left Anterior Descending  Mid LAD lesion, 90% stenosed.  Left Circumflex  Prox Cx to Mid Cx lesion, 0% stenosed. Prox Cx to Mid Cx lesion with no stenosis was previously treated.  Right Coronary Artery  Ost RCA to Dist RCA lesion, 100% stenosed. The lesion was previously treated.  Angioplasty: A stent was successfully placed.  There is no residual stenosis  post intervention.  Dist RCA lesion, 95% stenosed.  Angioplasty: A stent was successfully placed.  There is no residual stenosis post intervention.  Right Posterior Descending Artery  RPDA filled by collaterals from Dist LAD.  Wall Motion              Left Heart   Left Ventricle The left ventricular size is normal. There is mild left ventricular systolic dysfunction. LV end diastolic pressure is mildly elevated. The left ventricular ejection fraction is 50-55% by visual estimate. There are LV function abnormalities. Moderate inferobasal hypokinesia, preserved overall LV function    Coronary Diagrams   Diagnostic Diagram     Post-Intervention Diagram       _____________   History of Present Illness     This is a 83 y.o. female with a past medical history significant for CAD status post PCI 2 to the right coronary artery and OM in 2003 and 2010.  Last left heart cath in 2011 demonstrated EF 65%, patent RCA stent was 60% in-stent restenosis proximally and 40% in-stent restenosis in the mid vessel. The PLV had proximal 50-60% stenosis and distal 50% stenosis, the ostial circumflex with 30-40% stenosed in the proximal OM demonstrated mild in-stent restenosis with a 90% branch ostial stenosis and proximal LAD of 30-40% stenosis. Medical therapy was recommended  that time.   On 05/06/16, she was at home and she reported intermittent central chest pain that x-ray started on Saturday with mild shortness of breath. She received 2 several nitroglycerin and aspirin. EKG on admission demonstrates 1-1/2 mm inferior and lateral ST elevation. Her chest pain is persistent. She has been evaluated by Dr. Gwenlyn Found, who is the on-call interventional cardiologist and deemed appropriate to take to the cardiac catheterization lab for acute inferior and lateral STEMI.    Hospital Course     1. Acute inferior STEMI: Secondary to RCA occlusion. S/p stenting of distal RCA into PDA with DES.  Remainder of RCA treated with POBA. On metoprolol, ACEi, DAPT. Will start statin.   Patient does have residual disease in mid LAD up to 90%. This is a heavily calcified vessel. Per Dr. Martinique -  I don't think the left main disease is significant. Needs to recover from STEMI. LAD disease is acomplex lesion and would require atherectomy. Given her age will manage medically but if she has recurrent angina it would be reasonable to consider PCI of LAD  2. HTN controlled  3. Hyperlipidemia. Will start lipitor 40 mg daily. LDL 177. She doesn't know why she hasn't been taking cholesterol lowering therapy.  4. PAD s/p fem-pop bypass.   MD to advise on discharge.  _____________  Discharge Vitals Blood pressure (!) 93/50, pulse 66, temperature 98.2 F (36.8 C), temperature source Oral, resp. rate 18, height _0  (1.6 m), weight 103 lb (46.7 kg), SpO2 100 %.  Filed Weights   05/06/16 2155 05/06/16 2210 05/08/16 1739  Weight: 108 lb 0.4 oz (49 kg) 104 lb 0.9 oz (47.2 kg) 103 lb (46.7 kg)    Labs & Radiologic Studies     CBC  Recent Labs  05/06/16 1940 05/06/16 2001 05/07/16 0838  WBC 7.9  --  9.0  NEUTROABS 6.0  --   --   HGB 11.1* 10.5* 9.6*  HCT 34.4* 31.0* 30.1*  MCV 96.6  --  96.2  PLT 164  --  400   Basic Metabolic Panel  Recent Labs  05/06/16 1940 05/06/16 2001 05/07/16 0838  NA 139 140 136  K 4.8 4.4 4.2  CL 105 102 104  CO2 26  --  24  GLUCOSE 143* 149* 104*  BUN 16 18 21*  CREATININE 0.88 0.80 0.94  CALCIUM 11.5*  --  10.7*   Liver Function Tests  Recent Labs  05/06/16 1940  AST 29  ALT 14  ALKPHOS 92  BILITOT 0.5  PROT 7.0  ALBUMIN 3.9  Cardiac Enzymes  Recent Labs  05/06/16 1940 05/07/16 0139 05/07/16 0838  TROPONINI 0.21* 19.38* 36.91*   Fasting Lipid Panel  Recent Labs  05/06/16 1940  CHOL 260*  HDL 70  LDLCALC 177*  TRIG 67  CHOLHDL 3.7    Dg Chest 2 View  Result Date: 05/07/2016 CLINICAL DATA:  STEMI. EXAM: CHEST  2 VIEW  COMPARISON:  05/05/2009 FINDINGS: There is hyperinflation of the lungs compatible with COPD. Mild cardiomegaly. No confluent opacities or effusions. No acute bony abnormality. IMPRESSION: COPD, cardiomegaly.  No active disease. Electronically Signed   By: Rolm Baptise M.D.   On: 05/07/2016 17:04    Disposition   Pt is being discharged home today in good condition.  Follow-up Plans & Appointments    Follow-up Information    Barrett, Suanne Marker, PA-C Follow up on 05/21/2016.   Specialties:  Cardiology, Radiology Why:  at 1:30 for hospital follow up.  Contact information: [address] [place] [address] [phone]          Discharge Instructions    AMB Referral to Cardiac Rehabilitation - Phase II    Complete by:  As directed    Diagnosis:  STEMI   Diet - low sodium heart healthy    Complete by:  As directed    Discharge instructions    Complete by:  As directed    NO HEAVY LIFTING OR SEXUAL ACTIVITY for 7 DAYS. NO DRIVING for 3-5 DAYS. NO SOAKING BATHS, HOT TUBS, POOLS, ETC., for 7 DAYS   Groin Site Care Refer to this sheet in the next few weeks. These instructions provide you with information on caring for yourself after your procedure. Your caregiver may also give you more specific instructions. Your treatment has been planned according to current medical practices, but problems sometimes occur. Call your caregiver if you have any problems or questions after your procedure. HOME CARE INSTRUCTIONS You may shower 24 hours after the procedure. Remove the bandage (dressing) and gently wash the site with plain soap and water. Gently pat the site dry.  Do not apply powder or lotion to the site.  Do not sit in a bathtub, swimming pool, or whirlpool for 5 to 7 days.  No bending, squatting, or lifting anything over 10 pounds (4.5 kg) as directed by your caregiver.  Inspect the site at least twice daily.  Do not drive home if you are discharged the same day of the  procedure. Have someone else drive you.  You may drive 24 hours after the procedure unless otherwise instructed by your caregiver.  What to expect: Any bruising will usually fade within 1 to 2 weeks.  Blood that collects in the tissue (hematoma) may be painful to the touch. It should usually decrease in size and tenderness within 1 to 2 weeks.  SEEK IMMEDIATE MEDICAL CARE IF: You have unusual pain at the groin site or down the affected leg.  You have redness, warmth, swelling, or pain at the groin site.  You have drainage (other than a small amount of blood on the dressing).  You have chills.  You have a fever or persistent symptoms for more than 72 hours.  You have a fever and your symptoms suddenly get worse.  Your leg becomes pale, cool, tingly, or numb.  You have heavy bleeding from the site. Hold pressure on the site. .   Increase activity slowly    Complete by:  As directed       Discharge Medications   Current Discharge Medication List    START taking these medications   Details  atorvastatin (LIPITOR) 40 MG tablet Take 1 tablet (40 mg total) by mouth daily at 6 PM. Qty: 30 tablet, Refills: 12    !! ticagrelor (BRILINTA) 90 MG TABS tablet Take 1 tablet (90 mg total) by mouth 2 (two) times daily. Qty: 60 tablet, Refills: 12    !! ticagrelor (BRILINTA) 90 MG TABS tablet Take 1 tablet (90 mg total) by mouth 2 (two) times daily. Qty: 60 tablet, Refills: 0     !! - Potential duplicate medications found. Please discuss with provider.    CONTINUE these medications which have NOT CHANGED   Details  acetaminophen (TYLENOL) 500 MG tablet Take 500 mg by mouth every 6 (six) hours as needed for mild pain.    aspirin 81 MG tablet Take 1 tablet (81 mg total) by mouth daily.  bimatoprost (LUMIGAN) 0.03 % ophthalmic drops Apply 1 drop to eye at bedtime.     brimonidine (ALPHAGAN) 0.15 % ophthalmic solution Place 1 drop into the right eye 2 (two) times daily. Qty: 5 mL, Refills: 0     metoprolol succinate (TOPROL-XL) 50 MG 24 hr tablet TAKE ONE TABLET BY MOUTH EVERY DAY Qty: 30 tablet, Refills: 6    nitroGLYCERIN (NITROSTAT) 0.4 MG SL tablet Place 1 tablet (0.4 mg total) under the tongue every 5 (five) minutes as needed. For chest pain Qty: 25 tablet, Refills: 2    ramipril (ALTACE) 10 MG capsule TAKE ONE CAPSULE BY MOUTH EVERY DAY Qty: 30 capsule, Refills: 6    timolol (BETIMOL) 0.5 % ophthalmic solution Place 1 drop into both eyes 2 (two) times daily.    traMADol (ULTRAM) 50 MG tablet Take 1-2 tablets (50-100 mg total) by mouth 2 (two) times daily. Take 2 tablets every morning and take 1 tablet every evening Qty: 30 tablet, Refills: 0      STOP taking these medications     clopidogrel (PLAVIX) 75 MG tablet          Aspirin prescribed at discharge?  Yes High Intensity Statin Prescribed? (Lipitor 40-36m or Crestor 20-452m: Yes Beta Blocker Prescribed? Yes For EF 40% or less, Was ACEI/ARB Prescribed? No: EF 50-55% ADP Receptor Inhibitor Prescribed? (i.e. Plavix etc.-Includes Medically Managed Patients): Yes For EF <40%, Aldosterone Inhibitor Prescribed? No: EF 50-55% Was EF assessed during THIS hospitalization? Yes Was Cardiac Rehab II ordered? (Included Medically managed Patients): Yes   Outstanding Labs/Studies     Duration of Discharge Encounter   Greater than 30 minutes including physician time.  Signed, ErArbutus LeasP 05/09/2016, 11:03 AM

## 2016-05-21 ENCOUNTER — Ambulatory Visit: Payer: Medicare Other | Admitting: Physician Assistant

## 2016-05-22 ENCOUNTER — Encounter (INDEPENDENT_AMBULATORY_CARE_PROVIDER_SITE_OTHER): Payer: Self-pay

## 2016-05-22 ENCOUNTER — Ambulatory Visit: Payer: Medicare Other | Admitting: Cardiology

## 2016-05-22 ENCOUNTER — Ambulatory Visit (INDEPENDENT_AMBULATORY_CARE_PROVIDER_SITE_OTHER): Payer: Medicare Other | Admitting: Physician Assistant

## 2016-05-22 VITALS — BP 126/60 | HR 64 | Ht 63.0 in | Wt 105.0 lb

## 2016-05-22 DIAGNOSIS — I2111 ST elevation (STEMI) myocardial infarction involving right coronary artery: Secondary | ICD-10-CM

## 2016-05-22 DIAGNOSIS — E785 Hyperlipidemia, unspecified: Secondary | ICD-10-CM

## 2016-05-22 DIAGNOSIS — I213 ST elevation (STEMI) myocardial infarction of unspecified site: Secondary | ICD-10-CM

## 2016-05-22 DIAGNOSIS — I10 Essential (primary) hypertension: Secondary | ICD-10-CM | POA: Diagnosis not present

## 2016-05-22 DIAGNOSIS — Z4802 Encounter for removal of sutures: Secondary | ICD-10-CM

## 2016-05-22 DIAGNOSIS — I2119 ST elevation (STEMI) myocardial infarction involving other coronary artery of inferior wall: Secondary | ICD-10-CM

## 2016-05-22 NOTE — Patient Instructions (Addendum)
Your physician recommends that you continue on your current medications as directed. Please refer to the Current Medication list given to you today.   Your physician recommends that you schedule a follow-up appointment in:  2-3 MONTHS  WITH DR  [facility] OFFICE

## 2016-05-22 NOTE — Progress Notes (Signed)
Cardiology Office Note    Date:  05/22/2016   ID:  [name], DOB 03-Jul-1925, MRN [id number]  PCP:  Lillard Anes, MD  Cardiologist:  Dr. Percival Spanish   Chief Complaint: Hospital follow up s/p  STEMI  History of Present Illness:   [name] is a 83 y.o. female with a past medical history significant for CAD status post PCI 2 to the right coronary artery and OM in 2003 and 2010 who recently admitted for STEMI presents for follow up.   Came to ER 05/06/16 with acute onset central chest pain. EKG showed acute inferior lateral STEMI. Felt secondary to RCA occlusion. S/p stenting of distal RCA into PDA with DES. Remainder of RCA treated with POBA. Patient does have residual disease in mid LAD up to 90%. This is a heavily calcified vessel. Per Dr. Martinique  the left main disease is significant. Needs to recover from STEMI. LAD disease is acomplex lesion and would require atherectomy. Given her age will manage medically but if she has recurrent angina it would be reasonable to consider PCI of LAD. Not on any statin therapy prior to admission. Started on Lipitor 40 mg daily. Echocardiogram showed EF of 56-25%, grade 1 diastolic dysfunction.  Here today for follow-up. She is been walking without any chest pain or shortness of breath. Denies orthopnea, PND, syncope or lower extremity edema, melena or blood in her stool or urine.  Past Medical History:  Diagnosis Date  . Abscess 2003   I&D SCM abscess  . Acute ischemic colitis ([place]) 04/10/11   acute episode of ischemic colitis, with abd pain, CT evidence of transverse colitis, and blood streaked BM.  Resolved with conservative treatment.  . Arthritis   . Bacteremia 2003   hx of staph bacteremia  . CAD (coronary artery disease) 2003, 2010   s/p stenting of RCA x2, s/p stent to OM;  LHC 04/2009 in the setting of a NSTEMI: EF 65%, RCA stent patent with 60% ISR proximal and 40% ISR mid, PLV proximal 50-60% and distal 50%, ostial  circumflex 30-40%, proximal OM mild in-stent restenosis with a branch with 90+ percent ostial stenosis, proximal LAD 30-40%.  FFR of ostial circumflex:  0.96 - not flow limiting.  Medical therapy was continued.  . Degenerative disc disease   . Depression   . History of medication noncompliance    concerning Plavix  . HTN (hypertension)    Echocardiogram 08/26/11: Mild focal basal septal hypertrophy, EF 63-89%, grade 2 diastolic dysfunction, mild MR.  There was a fluid-filled area likely in the location of the liver noted  . Hyperlipidemia   . Hyperparathyroidism 2003   parathyroid adenoma localized to the right inferior thyroid lobe region  . Hypothyroidism   . Lumbar stenosis with neurogenic claudication 2006   s/p decompression L2-L5  . Osteoporosis 2009   diagnosed by bone density scan in 2009, on Vit D only, no bisphosphonate therapy  . Phlebitis    LLE  . PVD (peripheral vascular disease) ([facility])    s/p left fem-pop bypass graft  . Renal artery stenosis ([facility])    left, 40%  . TIA (transient ischemic attack)    carotid dopplers 5/13: no ICA stenosis    Past Surgical History:  Procedure Laterality Date  . APPENDECTOMY    . BACK SURGERY  06/2004   L3, L4 lami; L2, L5 hemilami; decompresion L2-3, 3-4, 4-5  . CARDIAC CATHETERIZATION N/A 05/06/2016   Procedure: Left Heart Cath and Coronary Angiography;  Surgeon: Lorretta Harp, MD;  Location: [facility];  Service: Cardiovascular;  Laterality: N/A;  . CARDIAC CATHETERIZATION N/A 05/06/2016   Procedure: Coronary Stent Intervention;  Surgeon: Lorretta Harp, MD;  Location: [facility];  Service: Cardiovascular;  Laterality: N/A;  RCA  . CORONARY ANGIOPLASTY WITH STENT PLACEMENT  09/2008  . CORONARY ANGIOPLASTY WITH STENT PLACEMENT  04/2001  . FEMORAL BYPASS  before 2003   LLE  . INCISION AND DRAINAGE ANTERIOR NECK  10/2001   absces sternoclavicular joint    Current Medications: Prior to Admission medications     Medication Sig Start Date End Date Taking? Authorizing Provider  acetaminophen (TYLENOL) 500 MG tablet Take 500 mg by mouth every 6 (six) hours as needed for mild pain.    Historical Provider, MD  aspirin 81 MG tablet Take 1 tablet (81 mg total) by mouth daily. 08/21/12   Minus Breeding, MD  atorvastatin (LIPITOR) 40 MG tablet Take 1 tablet (40 mg total) by mouth daily at 6 PM. 05/09/16   Arbutus Leas, NP  bimatoprost (LUMIGAN) 0.03 % ophthalmic drops Apply 1 drop to eye at bedtime.     Historical Provider, MD  brimonidine (ALPHAGAN) 0.15 % ophthalmic solution Place 1 drop into the right eye 2 (two) times daily. 04/16/11   Hester Mates, MD  metoprolol succinate (TOPROL-XL) 50 MG 24 hr tablet TAKE ONE TABLET BY MOUTH EVERY DAY 09/30/12   Minus Breeding, MD  nitroGLYCERIN (NITROSTAT) 0.4 MG SL tablet Place 1 tablet (0.4 mg total) under the tongue every 5 (five) minutes as needed. For chest pain 09/14/12   Minus Breeding, MD  ramipril (ALTACE) 10 MG capsule TAKE ONE CAPSULE BY MOUTH EVERY DAY 09/30/12   Minus Breeding, MD  ticagrelor (BRILINTA) 90 MG TABS tablet Take 1 tablet (90 mg total) by mouth 2 (two) times daily. 05/09/16   Arbutus Leas, NP  ticagrelor (BRILINTA) 90 MG TABS tablet Take 1 tablet (90 mg total) by mouth 2 (two) times daily. 05/09/16   Arbutus Leas, NP  timolol (BETIMOL) 0.5 % ophthalmic solution Place 1 drop into both eyes 2 (two) times daily.    Historical Provider, MD  traMADol (ULTRAM) 50 MG tablet Take 1-2 tablets (50-100 mg total) by mouth 2 (two) times daily. Take 2 tablets every morning and take 1 tablet every evening 04/16/11   Hester Mates, MD    Allergies:   Diphenhydramine hcl; Penicillins; and Shellfish allergy   Social History   Social History  . Marital status: Widowed    Spouse name: N/A  . Number of children: N/A  . Years of education: N/A   Social History Main Topics  . Smoking status: Never Smoker  . Smokeless tobacco: Never Used  . Alcohol use No  . Drug use: No   . Sexual activity: No   Other Topics Concern  . Not on file   Social History Narrative   Lives with daughter and granddaughter    PCP: Dr. Reinaldo Meeker in [place]           Family History:  The patient's family history includes Stroke in her father and mother.   ROS:   Please see the history of present illness.    ROS All other systems reviewed and are negative.   PHYSICAL EXAM:   VS:  BP 126/60   Pulse 64   Ht 5\' 3"  (1.6 m)   Wt 105 lb (47.6 kg)   BMI 18.60  kg/m    GEN: Well nourished, well developed, in no acute distress  HEENT: normal  Neck: no JVD, carotid bruits, or masses Cardiac: RRR; no murmurs, rubs, or gallops,no edema.  R groin cath site has no hematoma. Has 1 stiches.  Respiratory:  clear to auscultation bilaterally, normal work of breathing GI: soft, nontender, nondistended, + BS MS: no deformity or atrophy  Skin: warm and dry, no rash Neuro:  Alert and Oriented x 3, Strength and sensation are intact Psych: euthymic mood, full affect  Wt Readings from Last 3 Encounters:  05/22/16 105 lb (47.6 kg)  05/08/16 103 lb (46.7 kg)  05/08/15 108 lb (49 kg)      Studies/Labs Reviewed:   EKG:  EKG is not ordered today.    Recent Labs: 05/06/2016: ALT 14 05/07/2016: BUN 21; Creatinine, Ser 0.94; Hemoglobin 9.6; Platelets 160; Potassium 4.2; Sodium 136   Lipid Panel    Component Value Date/Time   CHOL 260 (H) 05/06/2016 1940   TRIG 67 05/06/2016 1940   HDL 70 05/06/2016 1940   CHOLHDL 3.7 05/06/2016 1940   VLDL 13 05/06/2016 1940   LDLCALC 177 (H) 05/06/2016 1940    Additional studies/ records that were reviewed today include:   Echocardiogram: 05/08/16 LV EF: 50% -   55%  ------------------------------------------------------------------- Indications:      CAD of native vessels 414.01.  ------------------------------------------------------------------- History:   PMH:   Coronary artery disease.  Transient ischemic attack.  Risk factors:   Hypertension.  ------------------------------------------------------------------- Study Conclusions  - Left ventricle: Systolic function was normal. The estimated   ejection fraction was in the range of 50% to 55%. Basal to   mid-inferior wall hypokinesis. Doppler parameters are consistent   with abnormal left ventricular relaxation (grade 1 diastolic   dysfunction). The E/e&' ratio is >15, suggesting elevated LV   filling pressure. - Aortic valve: Trileaflet. Sclerosis without stenosis. There was   no regurgitation. - Mitral valve: Mildly thickened leaflets with ischemic tethering   of the posterior leaflet. There was mild, posteriorly directed   regurgitation. - Left atrium: The atrium was normal in size. - Tricuspid valve: There was trivial regurgitation. - Pulmonary arteries: PA peak pressure: 12 mm Hg (S). - Inferior vena cava: The vessel was normal in size. The   respirophasic diameter changes were in the normal range (>= 50%),   consistent with normal central venous pressure.  Impressions:  - Compared to a prior study in 2013, there is now basal to mid   inferior hypokinesis. The LVEF is low normal at 50-55% with grade   1 DD and elevated LV filling pressures. There is at least mild,   posteriorly directed MR - with ischemic tethetering of the   posterior leaflet.  Cardiac Catheterization: 05/06/16 Conclusion     Ost LM to LM lesion, 60 %stenosed.  Prox Cx to Mid Cx lesion, 0 %stenosed.  Mid LAD lesion, 90 %stenosed.  Ost RCA to Dist RCA lesion, 100 %stenosed.  Dist RCA lesion, 95 %stenosed.  Post intervention, there is a 0% residual stenosis.  A stent was successfully placed.  There is mild left ventricular systolic dysfunction.  LV end diastolic pressure is mildly elevated.  The left ventricular ejection fraction is 50-55% by visual estimate.     IMPRESSION: Successful PCI and drug-eluting stent of the distal dominant RCA into the PDA for  "inferior STEMI with a door to balloon time of 25 minutes. There was a moderate thrombus burden. I postdilated the entire stented  segment in the proximal RCA down to the distal RCA. The patient did have 60% distal left main disease as well as high grade segmental mid LAD which given her age will be treated medically. We will continue dual antibiotic therapy. Angiomax continued for 4 hours and then be discontinued. A 2-D echo will be obtained. The patient left the lab in stable condition.    ASSESSMENT & PLAN:    1. CAD - Cath report as above. Plan to treat LAD disease medically. Can consider thrombectomy/PCI if recurrent chest pain. - Ambulating without chest pain or dyspnea. She was started home health PT next week. Continue aspirin/splint/statin/metoprolol and ramipril.  2. HLD - 05/06/2016: Cholesterol 260; HDL 70; LDL Cholesterol 177; Triglycerides 67; VLDL 13  - Continue Lipitor 40 mg daily. She will need lipid panel and LFTs checked during next office visit.  3. HTN - stable and well controlled on current regimen.  4. R groin cath site - Without hematoma. Healing well. Stitches removed in clinic today.    Medication Adjustments/Labs and Tests Ordered: Current medicines are reviewed at length with the patient today.  Concerns regarding medicines are outlined above.  Medication changes, Labs and Tests ordered today are listed in the Patient Instructions below. Patient Instructions  Your physician recommends that you continue on your current medications as directed. Please refer to the Current Medication list given to you today.   Your physician recommends that you schedule a follow-up appointment in:  2-3 MONTHS  WITH DR  Helayne Seminole, [place]  05/22/2016 [facility] [address], [place], [place]  [address] Phone: [phone]; Fax: [phone]

## 2016-05-23 ENCOUNTER — Encounter: Payer: Self-pay | Admitting: *Deleted

## 2016-05-28 DIAGNOSIS — H401133 Primary open-angle glaucoma, bilateral, severe stage: Secondary | ICD-10-CM | POA: Diagnosis not present

## 2016-06-22 ENCOUNTER — Encounter (HOSPITAL_COMMUNITY): Payer: Self-pay | Admitting: Emergency Medicine

## 2016-06-22 ENCOUNTER — Emergency Department (HOSPITAL_COMMUNITY): Payer: Medicare Other

## 2016-06-22 ENCOUNTER — Inpatient Hospital Stay (HOSPITAL_COMMUNITY)
Admission: EM | Admit: 2016-06-22 | Discharge: 2016-06-25 | DRG: 303 | Disposition: A | Discharge status: Home / Self Care | Payer: Medicare Other | Attending: Internal Medicine | Admitting: Internal Medicine

## 2016-06-22 DIAGNOSIS — I739 Peripheral vascular disease, unspecified: Secondary | ICD-10-CM | POA: Diagnosis present

## 2016-06-22 DIAGNOSIS — E039 Hypothyroidism, unspecified: Secondary | ICD-10-CM | POA: Diagnosis present

## 2016-06-22 DIAGNOSIS — K8689 Other specified diseases of pancreas: Secondary | ICD-10-CM

## 2016-06-22 DIAGNOSIS — I252 Old myocardial infarction: Secondary | ICD-10-CM

## 2016-06-22 DIAGNOSIS — R740 Nonspecific elevation of levels of transaminase and lactic acid dehydrogenase [LDH]: Secondary | ICD-10-CM | POA: Diagnosis present

## 2016-06-22 DIAGNOSIS — F32A Depression, unspecified: Secondary | ICD-10-CM | POA: Diagnosis present

## 2016-06-22 DIAGNOSIS — K862 Cyst of pancreas: Secondary | ICD-10-CM | POA: Diagnosis not present

## 2016-06-22 DIAGNOSIS — I10 Essential (primary) hypertension: Secondary | ICD-10-CM | POA: Diagnosis present

## 2016-06-22 DIAGNOSIS — D649 Anemia, unspecified: Secondary | ICD-10-CM | POA: Diagnosis not present

## 2016-06-22 DIAGNOSIS — I2584 Coronary atherosclerosis due to calcified coronary lesion: Secondary | ICD-10-CM | POA: Diagnosis present

## 2016-06-22 DIAGNOSIS — F329 Major depressive disorder, single episode, unspecified: Secondary | ICD-10-CM | POA: Diagnosis present

## 2016-06-22 DIAGNOSIS — R079 Chest pain, unspecified: Secondary | ICD-10-CM | POA: Diagnosis not present

## 2016-06-22 DIAGNOSIS — E782 Mixed hyperlipidemia: Secondary | ICD-10-CM

## 2016-06-22 DIAGNOSIS — I25110 Atherosclerotic heart disease of native coronary artery with unstable angina pectoris: Secondary | ICD-10-CM | POA: Diagnosis not present

## 2016-06-22 DIAGNOSIS — Z7982 Long term (current) use of aspirin: Secondary | ICD-10-CM

## 2016-06-22 DIAGNOSIS — E785 Hyperlipidemia, unspecified: Secondary | ICD-10-CM | POA: Diagnosis present

## 2016-06-22 DIAGNOSIS — M810 Age-related osteoporosis without current pathological fracture: Secondary | ICD-10-CM | POA: Diagnosis present

## 2016-06-22 DIAGNOSIS — Z955 Presence of coronary angioplasty implant and graft: Secondary | ICD-10-CM

## 2016-06-22 DIAGNOSIS — Z8673 Personal history of transient ischemic attack (TIA), and cerebral infarction without residual deficits: Secondary | ICD-10-CM

## 2016-06-22 DIAGNOSIS — R1013 Epigastric pain: Secondary | ICD-10-CM | POA: Diagnosis not present

## 2016-06-22 DIAGNOSIS — Z7902 Long term (current) use of antithrombotics/antiplatelets: Secondary | ICD-10-CM

## 2016-06-22 DIAGNOSIS — R072 Precordial pain: Secondary | ICD-10-CM

## 2016-06-22 DIAGNOSIS — R109 Unspecified abdominal pain: Secondary | ICD-10-CM | POA: Diagnosis not present

## 2016-06-22 DIAGNOSIS — Z79899 Other long term (current) drug therapy: Secondary | ICD-10-CM

## 2016-06-22 DIAGNOSIS — I2510 Atherosclerotic heart disease of native coronary artery without angina pectoris: Secondary | ICD-10-CM | POA: Diagnosis present

## 2016-06-22 DIAGNOSIS — I110 Hypertensive heart disease with heart failure: Secondary | ICD-10-CM | POA: Diagnosis present

## 2016-06-22 DIAGNOSIS — Z823 Family history of stroke: Secondary | ICD-10-CM

## 2016-06-22 DIAGNOSIS — R001 Bradycardia, unspecified: Secondary | ICD-10-CM | POA: Diagnosis present

## 2016-06-22 DIAGNOSIS — R945 Abnormal results of liver function studies: Secondary | ICD-10-CM

## 2016-06-22 DIAGNOSIS — I701 Atherosclerosis of renal artery: Secondary | ICD-10-CM | POA: Diagnosis present

## 2016-06-22 DIAGNOSIS — R7989 Other specified abnormal findings of blood chemistry: Secondary | ICD-10-CM | POA: Diagnosis not present

## 2016-06-22 DIAGNOSIS — Z8672 Personal history of thrombophlebitis: Secondary | ICD-10-CM

## 2016-06-22 LAB — HEPATIC FUNCTION PANEL
ALBUMIN: 3.6 g/dL (ref 3.5–5.0)
ALK PHOS: 374 U/L — AB (ref 38–126)
ALT: 208 U/L — AB (ref 14–54)
AST: 246 U/L — AB (ref 15–41)
BILIRUBIN TOTAL: 0.7 mg/dL (ref 0.3–1.2)
Bilirubin, Direct: 0.2 mg/dL (ref 0.1–0.5)
Indirect Bilirubin: 0.5 mg/dL (ref 0.3–0.9)
Total Protein: 7.6 g/dL (ref 6.5–8.1)

## 2016-06-22 LAB — BASIC METABOLIC PANEL
ANION GAP: 8 (ref 5–15)
BUN: 18 mg/dL (ref 6–20)
CHLORIDE: 110 mmol/L (ref 101–111)
CO2: 21 mmol/L — AB (ref 22–32)
Calcium: 11.8 mg/dL — ABNORMAL HIGH (ref 8.9–10.3)
Creatinine, Ser: 1.03 mg/dL — ABNORMAL HIGH (ref 0.44–1.00)
GFR calc non Af Amer: 47 mL/min — ABNORMAL LOW (ref >60)
GFR, EST AFRICAN AMERICAN: 55 mL/min — AB (ref >60)
GLUCOSE: 91 mg/dL (ref 65–99)
Potassium: 4.4 mmol/L (ref 3.5–5.1)
Sodium: 139 mmol/L (ref 135–145)

## 2016-06-22 LAB — CREATININE, SERUM
Creatinine, Ser: 0.99 mg/dL (ref 0.44–1.00)
GFR, EST AFRICAN AMERICAN: 57 mL/min — AB (ref >60)
GFR, EST NON AFRICAN AMERICAN: 49 mL/min — AB (ref >60)

## 2016-06-22 LAB — TROPONIN I
TROPONIN I: 0.07 ng/mL — AB (ref <0.03)
Troponin I: 0.06 ng/mL (ref <0.03)
Troponin I: 0.06 ng/mL (ref <0.03)

## 2016-06-22 LAB — CBC
HEMATOCRIT: 33.9 % — AB (ref 36.0–46.0)
HEMOGLOBIN: 10.8 g/dL — AB (ref 12.0–15.0)
MCH: 31.3 pg (ref 26.0–34.0)
MCHC: 31.9 g/dL (ref 30.0–36.0)
MCV: 98.3 fL (ref 78.0–100.0)
Platelets: 220 10*3/uL (ref 150–400)
RBC: 3.45 MIL/uL — AB (ref 3.87–5.11)
RDW: 13.8 % (ref 11.5–15.5)
WBC: 9.3 10*3/uL (ref 4.0–10.5)

## 2016-06-22 LAB — I-STAT TROPONIN, ED: Troponin i, poc: 0.02 ng/mL (ref 0.00–0.08)

## 2016-06-22 LAB — LIPASE, BLOOD: Lipase: <10 U/L — ABNORMAL LOW (ref 11–51)

## 2016-06-22 MED ORDER — ZOLPIDEM TARTRATE 5 MG PO TABS: 5.0000 mg | ORAL_TABLET | Freq: Every evening | ORAL | Status: DC | PRN

## 2016-06-22 MED ORDER — TIMOLOL MALEATE 0.5 % OP SOLN
1.0000 [drp] | Freq: Two times a day (BID) | OPHTHALMIC | Status: DC
  Administered 2016-06-22 – 2016-06-25 (×6): 1 [drp] via OPHTHALMIC
  Filled 2016-06-22: qty 5

## 2016-06-22 MED ORDER — ACETAMINOPHEN 325 MG PO TABS: 650.0000 mg | ORAL_TABLET | ORAL | Status: DC | PRN

## 2016-06-22 MED ORDER — BRIMONIDINE TARTRATE 0.15 % OP SOLN
1.0000 [drp] | Freq: Two times a day (BID) | OPHTHALMIC | Status: DC
  Administered 2016-06-22 – 2016-06-25 (×6): 1 [drp] via OPHTHALMIC
  Filled 2016-06-22: qty 5

## 2016-06-22 MED ORDER — ONDANSETRON HCL 4 MG/2ML IJ SOLN: 4.0000 mg | Freq: Four times a day (QID) | INTRAMUSCULAR | Status: DC | PRN

## 2016-06-22 MED ORDER — ACETAMINOPHEN 500 MG PO TABS: 500.0000 mg | ORAL_TABLET | Freq: Four times a day (QID) | ORAL | Status: DC | PRN

## 2016-06-22 MED ORDER — TICAGRELOR 90 MG PO TABS
90.0000 mg | ORAL_TABLET | Freq: Two times a day (BID) | ORAL | Status: DC
  Administered 2016-06-22 – 2016-06-25 (×6): 90 mg via ORAL
  Filled 2016-06-22 (×6): qty 1

## 2016-06-22 MED ORDER — MORPHINE SULFATE (PF) 4 MG/ML IV SOLN: 2.0000 mg | INTRAVENOUS | Status: DC | PRN

## 2016-06-22 MED ORDER — ASPIRIN EC 81 MG PO TBEC
81.0000 mg | DELAYED_RELEASE_TABLET | Freq: Every day | ORAL | Status: DC
  Administered 2016-06-23 – 2016-06-25 (×3): 81 mg via ORAL
  Filled 2016-06-22 (×3): qty 1

## 2016-06-22 MED ORDER — NITROGLYCERIN 0.4 MG SL SUBL
0.4000 mg | SUBLINGUAL_TABLET | SUBLINGUAL | Status: DC | PRN
  Administered 2016-06-22: 0.4 mg via SUBLINGUAL
  Filled 2016-06-22: qty 1

## 2016-06-22 MED ORDER — METOPROLOL SUCCINATE ER 50 MG PO TB24
50.0000 mg | ORAL_TABLET | Freq: Every day | ORAL | Status: DC
  Administered 2016-06-22 – 2016-06-25 (×4): 50 mg via ORAL
  Filled 2016-06-22 (×4): qty 1

## 2016-06-22 MED ORDER — RAMIPRIL 10 MG PO CAPS
10.0000 mg | ORAL_CAPSULE | Freq: Every day | ORAL | Status: DC
  Administered 2016-06-22 – 2016-06-25 (×4): 10 mg via ORAL
  Filled 2016-06-22: qty 2
  Filled 2016-06-22: qty 1
  Filled 2016-06-22: qty 2
  Filled 2016-06-22: qty 1
  Filled 2016-06-22: qty 2
  Filled 2016-06-22: qty 1
  Filled 2016-06-22: qty 2
  Filled 2016-06-22: qty 1

## 2016-06-22 MED ORDER — TRAMADOL HCL 50 MG PO TABS: 50.0000 mg | ORAL_TABLET | Freq: Two times a day (BID) | ORAL | Status: DC | PRN

## 2016-06-22 MED ORDER — HEPARIN SODIUM (PORCINE) 5000 UNIT/ML IJ SOLN
5000.0000 [IU] | Freq: Three times a day (TID) | INTRAMUSCULAR | Status: DC
  Administered 2016-06-22 – 2016-06-25 (×9): 5000 [IU] via SUBCUTANEOUS
  Filled 2016-06-22 (×9): qty 1

## 2016-06-22 MED ORDER — LATANOPROST 0.005 % OP SOLN
1.0000 [drp] | Freq: Every day | OPHTHALMIC | Status: DC
  Administered 2016-06-22 – 2016-06-24 (×3): 1 [drp] via OPHTHALMIC
  Filled 2016-06-22: qty 2.5

## 2016-06-22 MED ORDER — GI COCKTAIL ~~LOC~~: 30.0000 mL | Freq: Four times a day (QID) | ORAL | Status: DC | PRN

## 2016-06-22 MED ORDER — TRAMADOL HCL 50 MG PO TABS: 50.0000 mg | ORAL_TABLET | Freq: Two times a day (BID) | ORAL | Status: DC

## 2016-06-22 MED ORDER — DORZOLAMIDE HCL 2 % OP SOLN
1.0000 [drp] | Freq: Three times a day (TID) | OPHTHALMIC | Status: DC
  Administered 2016-06-22 – 2016-06-25 (×9): 1 [drp] via OPHTHALMIC
  Filled 2016-06-22: qty 10

## 2016-06-22 NOTE — H&P (Signed)
History and Physical    [name] KXF:[id number] DOB: 06-14-1925 DOA: 06/22/2016  PCP: Lillard Anes, MD   Patient coming from: home  Chief Complaint: chest pain, epigastric pain  HPI: [name] is a 83 y.o. female with medical history significant of recent MI treated with cardiac cath andgioplasty in January 2018, HTN, hyperlipidemia with previous statin intolerance, hypothyroidism, hyperparathyroidism, depression, degenerative disc disease who presented to the emergency department with complaints of chest pain. Patient reported the pain has been present in the middle of the chest and slightly radiating across the chest also complained of upper epigastric discomfort. He denied shortness of breath, diaphoresis, nausea or radiation of the pain to the back shoulders or upper extremities   ED Course: Family called EMS, upon arrival she they gave patient full dose of aspirin and delivered her to the emergency department for evaluation. In the emergency department his vital signs were stable except transient bradycardia with heart rate 57, Blood work showed hemoglobin 10.8 hematocrit 33.9%, creatinine 1.03  Troponin was -0.02 Chest x-ray didn't show any active cardiopulmonary disease EKG showed normal sinus rhythm without any new ischemic changes  Review of Systems: As per HPI otherwise 10 point review of systems negative.   Ambulatory Status: Independent   Past Medical History:  Diagnosis Date  . Abscess 2003   I&D SCM abscess  . Acute ischemic colitis ([place]) 04/10/11   acute episode of ischemic colitis, with abd pain, CT evidence of transverse colitis, and blood streaked BM.  Resolved with conservative treatment.  . Arthritis   . Bacteremia 2003   hx of staph bacteremia  . CAD (coronary artery disease) 2003, 2010   s/p stenting of RCA x2, s/p stent to OM;  LHC 04/2009 in the setting of a NSTEMI: EF 65%, RCA stent patent with 60% ISR proximal and 40% ISR mid, PLV proximal  50-60% and distal 50%, ostial circumflex 30-40%, proximal OM mild in-stent restenosis with a branch with 90+ percent ostial stenosis, proximal LAD 30-40%.  FFR of ostial circumflex:  0.96 - not flow limiting.  Medical therapy was continued.  . Degenerative disc disease   . Depression   . History of medication noncompliance    concerning Plavix  . HTN (hypertension)    Echocardiogram 08/26/11: Mild focal basal septal hypertrophy, EF 69-67%, grade 2 diastolic dysfunction, mild MR.  There was a fluid-filled area likely in the location of the liver noted  . Hyperlipidemia   . Hyperparathyroidism 2003   parathyroid adenoma localized to the right inferior thyroid lobe region  . Hypothyroidism   . Lumbar stenosis with neurogenic claudication 2006   s/p decompression L2-L5  . Osteoporosis 2009   diagnosed by bone density scan in 2009, on Vit D only, no bisphosphonate therapy  . Phlebitis    LLE  . PVD (peripheral vascular disease) ([facility])    s/p left fem-pop bypass graft  . Renal artery stenosis ([facility])    left, 40%  . TIA (transient ischemic attack)    carotid dopplers 5/13: no ICA stenosis    Past Surgical History:  Procedure Laterality Date  . APPENDECTOMY    . BACK SURGERY  06/2004   L3, L4 lami; L2, L5 hemilami; decompresion L2-3, 3-4, 4-5  . CARDIAC CATHETERIZATION N/A 05/06/2016   Procedure: Left Heart Cath and Coronary Angiography;  Surgeon: Lorretta Harp, MD;  Location: [facility];  Service: Cardiovascular;  Laterality: N/A;  . CARDIAC CATHETERIZATION N/A 05/06/2016   Procedure: Coronary Stent  Intervention;  Surgeon: Lorretta Harp, MD;  Location: [facility];  Service: Cardiovascular;  Laterality: N/A;  RCA  . CORONARY ANGIOPLASTY WITH STENT PLACEMENT  09/2008  . CORONARY ANGIOPLASTY WITH STENT PLACEMENT  04/2001  . FEMORAL BYPASS  before 2003   LLE  . INCISION AND DRAINAGE ANTERIOR NECK  10/2001   absces sternoclavicular joint    Social History   Social History   . Marital status: Widowed    Spouse name: N/A  . Number of children: N/A  . Years of education: N/A   Occupational History  . Not on file.   Social History Main Topics  . Smoking status: Never Smoker  . Smokeless tobacco: Never Used  . Alcohol use No  . Drug use: No  . Sexual activity: No   Other Topics Concern  . Not on file   Social History Narrative   Lives with daughter and granddaughter    PCP: Dr. Reinaldo Meeker in [place]          Allergies  Allergen Reactions  . Diphenhydramine Hcl Other (See Comments)    unknown  . Penicillins Other (See Comments)    unknown  . Shellfish Allergy Other (See Comments)    unknown    Family History  Problem Relation Age of Onset  . Stroke Mother   . Stroke Father     Prior to Admission medications   Medication Sig Start Date End Date Taking? Authorizing Provider  acetaminophen (TYLENOL) 500 MG tablet Take 500 mg by mouth every 6 (six) hours as needed for mild pain.   Yes Historical Provider, MD  aspirin 81 MG tablet Take 1 tablet (81 mg total) by mouth daily. 08/21/12  Yes Minus Breeding, MD  atorvastatin (LIPITOR) 40 MG tablet Take 1 tablet (40 mg total) by mouth daily at 6 PM. 05/09/16  Yes Arbutus Leas, NP  bimatoprost (LUMIGAN) 0.03 % ophthalmic drops Apply 1 drop to eye at bedtime.    Yes Historical Provider, MD  brimonidine (ALPHAGAN) 0.15 % ophthalmic solution Place 1 drop into the right eye 2 (two) times daily. 04/16/11  Yes Hester Mates, MD  dorzolamide (TRUSOPT) 2 % ophthalmic solution Place 1 drop into both eyes 3 (three) times daily.   Yes Historical Provider, MD  metoprolol succinate (TOPROL-XL) 50 MG 24 hr tablet TAKE ONE TABLET BY MOUTH EVERY DAY 09/30/12  Yes Minus Breeding, MD  ramipril (ALTACE) 10 MG capsule TAKE ONE CAPSULE BY MOUTH EVERY DAY 09/30/12  Yes Minus Breeding, MD  ticagrelor (BRILINTA) 90 MG TABS tablet Take 1 tablet (90 mg total) by mouth 2 (two) times daily. 05/09/16  Yes Arbutus Leas, NP  timolol  (BETIMOL) 0.5 % ophthalmic solution Place 1 drop into both eyes 2 (two) times daily.   Yes Historical Provider, MD  traMADol (ULTRAM) 50 MG tablet Take 1-2 tablets (50-100 mg total) by mouth 2 (two) times daily. Take 2 tablets every morning and take 1 tablet every evening 04/16/11  Yes Hester Mates, MD  clopidogrel (PLAVIX) 75 MG tablet Take 1 tablet by mouth daily. 05/01/16   Historical Provider, MD  nitroGLYCERIN (NITROSTAT) 0.4 MG SL tablet Place 1 tablet (0.4 mg total) under the tongue every 5 (five) minutes as needed. For chest pain 09/14/12   Minus Breeding, MD    Physical Exam: Vitals:   06/22/16 1245 06/22/16 1300 06/22/16 1443 06/22/16 1500  BP: 139/68 122/64 117/66 113/62  Pulse: (!) 59 61  (!) 57  Resp:  18 16   Temp:      TempSrc:      SpO2: 98% 98% 100% 98%  Weight:      Height:         General: Appears calm and comfortable Eyes: PERRLA, EOMI, normal lids, iris ENT:  grossly normal hearing, lips & tongue, mucous membranes moist and intact Neck: no lymphoadenopathy, masses or thyromegaly Cardiovascular: RRR, no m/r/g. No JVD, carotid bruits. No LE edema.  Respiratory: bilateral no wheezes, rales, rhonchi or cracles. Normal respiratory effort. No accessory muscle use observed Abdomen: soft, tender to palpation in the right upper quadrant, non-distended, no organomegaly or masses appreciated. BS present in all quadrants Skin: no rash, ulcers or induration seen on limited exam Musculoskeletal: grossly normal tone BUE/BLE, good ROM, no bony abnormality or joint deformities observed Psychiatric: grossly normal mood and affect, speech fluent and appropriate, alert and oriented x3 Neurologic: CN II-XII grossly intact, moves all extremities in coordinated fashion, sensation intact  Labs on Admission: I have personally reviewed following labs and imaging studies  CBC, BMP  GFR: Estimated Creatinine Clearance: 31.2 mL/min (by C-G formula based on SCr of 1.03 mg/dL  (H)).   Creatinine Clearance: Estimated Creatinine Clearance: 31.2 mL/min (by C-G formula based on SCr of 1.03 mg/dL (H)).    Radiological Exams on Admission: Dg Chest 2 View  Result Date: 06/22/2016 CLINICAL DATA:  Chest pain EXAM: CHEST  2 VIEW COMPARISON:  05/07/2016 chest radiograph. FINDINGS: Stable cardiomediastinal silhouette with normal heart size and aortic atherosclerosis. No pneumothorax. No pleural effusion. Lungs appear clear, with no acute consolidative airspace disease and no pulmonary edema. Coronary stent overlies the heart. IMPRESSION: No active cardiopulmonary disease. Aortic atherosclerosis. Electronically Signed   By: Ilona Sorrel M.D.   On: 06/22/2016 11:33   US Abdomen Complete  Result Date: 06/22/2016 CLINICAL DATA:  Epigastric pain since 10 a.m. this morning. EXAM: ABDOMEN ULTRASOUND COMPLETE COMPARISON:  Abdomen and pelvis CT 03/11/2014 FINDINGS: Gallbladder: No gallstones or gallbladder wall thickening. No pericholecystic fluid. The sonographer reports no sonographic Murphy's sign. Common bile duct: Diameter: 4 mm Liver: No focal lesion identified. Within normal limits in parenchymal echogenicity. IVC: No abnormality visualized. Pancreas: Obscured by bowel gas Spleen: Size and appearance within normal limits. Right Kidney: Length: 8.1 cm. Cortex looks echogenic compared to the reference liver parenchyma. No hydronephrosis. Left Kidney: Length: 9.2 cm. 3.0 cm simple cyst identified lower pole. Simple cyst was seen in the lower pole left kidney on previous CT over 2 years ago, measuring 2.5 cm at that time. Abdominal aorta: No aneurysm visualized. Other findings: None. IMPRESSION: No acute findings in the abdomen. Specifically, no sonographic evidence to explain the patient's history of pain. Electronically Signed   By: Misty Stanley M.D.   On: 06/22/2016 14:28    EKG: Independently reviewed - Sinus rhythm without new ishcemic changes  Assessment/Plan Principal  Problem:   Chest pain Active Problems:   HYPERLIPIDEMIA TYPE IIB / III   HYPERTENSION, BENIGN   CAD, NATIVE VESSEL   Abdominal pain   Depression   Chest pain Continue cycle cardiac enzymes, observe on telemetry - currently pain free Contuibue beta blocker therapy and monitor for recurrent symptoms  Abdominal pain with abnormal liver enzymes that could be associated with statin therapy intolerance Liver enzymes elevated: Alkaline phosphatase 374, AST and 46 AL T2 108 Abdominal ultrasound didn't reveal any acute findings in the abdomen Last time liver enzymes in January 2018 were normal We will hold statin  therapy for now Consider abdominal CT if pain persists  Hypertension Continue home medications and adjust the doses if needed  Hyperlipidemia Patient was treated with statins before and the therapy was discontinued due to myalgias and elevated liver enzymes in the past After PCI in January 2018 patient is started statin therapy, which now would need to be discontinued d/t  elevated LFTs Continue low-cholesterol low-fat diet   DVT prophylaxis: Heparin  Code Status: Full Family Communication: At bedside Disposition Plan: Telemetry Consults called: None Admission status: Observation   York Grice, [place] Pager: [phone] [facility]  If 7PM-7AM, please contact night-coverage [email or web address] Password [facility]  06/22/2016, 3:34 PM

## 2016-06-22 NOTE — ED Provider Notes (Signed)
[facility] DEPT Provider Note   CSN: [id number] Arrival date & time: 06/22/16  1041     History   Chief Complaint Chief Complaint  Patient presents with  . Chest Pain    HPI LANIYAH ROSENWALD is a 83 y.o. female.  The history is provided by the patient and a relative. No language interpreter was used.  Chest Pain     KRYSSA RISENHOOVER is a 83 y.o. female who presents to the Emergency Department complaining of chest pain.  She reports central, substernal chest pain that started last night. Pain is waxing and waning and nonradiating. There are no alleviating or worsening factors. She states that pain feels similar to prior MI back in January. His knee fevers, shortness of breath, cough, bowel pain, nausea, vomiting. She received 324 mg of aspirin by EMS. Past Medical History:  Diagnosis Date  . Abscess 2003   I&D SCM abscess  . Acute ischemic colitis ([place]) 04/10/11   acute episode of ischemic colitis, with abd pain, CT evidence of transverse colitis, and blood streaked BM.  Resolved with conservative treatment.  . Arthritis   . Bacteremia 2003   hx of staph bacteremia  . CAD (coronary artery disease) 2003, 2010   s/p stenting of RCA x2, s/p stent to OM;  LHC 04/2009 in the setting of a NSTEMI: EF 65%, RCA stent patent with 60% ISR proximal and 40% ISR mid, PLV proximal 50-60% and distal 50%, ostial circumflex 30-40%, proximal OM mild in-stent restenosis with a branch with 90+ percent ostial stenosis, proximal LAD 30-40%.  FFR of ostial circumflex:  0.96 - not flow limiting.  Medical therapy was continued.  . Degenerative disc disease   . Depression   . History of medication noncompliance    concerning Plavix  . HTN (hypertension)    Echocardiogram 08/26/11: Mild focal basal septal hypertrophy, EF 08-14%, grade 2 diastolic dysfunction, mild MR.  There was a fluid-filled area likely in the location of the liver noted  . Hyperlipidemia   . Hyperparathyroidism 2003   parathyroid  adenoma localized to the right inferior thyroid lobe region  . Hypothyroidism   . Lumbar stenosis with neurogenic claudication 2006   s/p decompression L2-L5  . Osteoporosis 2009   diagnosed by bone density scan in 2009, on Vit D only, no bisphosphonate therapy  . Phlebitis    LLE  . PVD (peripheral vascular disease) ([facility])    s/p left fem-pop bypass graft  . Renal artery stenosis ([facility])    left, 40%  . TIA (transient ischemic attack)    carotid dopplers 5/13: no ICA stenosis    Patient Active Problem List   Diagnosis Date Noted  . ST elevation myocardial infarction (STEMI) ([place]) 05/06/2016  . STEMI (ST elevation myocardial infarction) ([place]) 05/06/2016  . TIA (transient ischemic attack) 08/26/2011  . Fall 08/26/2011  . Abdominal pain 04/10/2011  . Urinary tract infection 04/10/2011  . Ischemic colitis ([place]) 04/10/2011  . Depression 04/10/2011  . CAD, NATIVE VESSEL 04/11/2008  . PVD 04/11/2008  . HYPERLIPIDEMIA TYPE IIB / III 04/06/2008  . HYPERTENSION, BENIGN 04/06/2008    Past Surgical History:  Procedure Laterality Date  . APPENDECTOMY    . BACK SURGERY  06/2004   L3, L4 lami; L2, L5 hemilami; decompresion L2-3, 3-4, 4-5  . CARDIAC CATHETERIZATION N/A 05/06/2016   Procedure: Left Heart Cath and Coronary Angiography;  Surgeon: Lorretta Harp, MD;  Location: [facility];  Service: Cardiovascular;  Laterality: N/A;  .  CARDIAC CATHETERIZATION N/A 05/06/2016   Procedure: Coronary Stent Intervention;  Surgeon: Lorretta Harp, MD;  Location: [facility];  Service: Cardiovascular;  Laterality: N/A;  RCA  . CORONARY ANGIOPLASTY WITH STENT PLACEMENT  09/2008  . CORONARY ANGIOPLASTY WITH STENT PLACEMENT  04/2001  . FEMORAL BYPASS  before 2003   LLE  . INCISION AND DRAINAGE ANTERIOR NECK  10/2001   absces sternoclavicular joint    OB History    No data available       Home Medications    Prior to Admission medications   Medication Sig Start Date End Date  Taking? Authorizing Provider  acetaminophen (TYLENOL) 500 MG tablet Take 500 mg by mouth every 6 (six) hours as needed for mild pain.   Yes Historical Provider, MD  aspirin 81 MG tablet Take 1 tablet (81 mg total) by mouth daily. 08/21/12  Yes Minus Breeding, MD  atorvastatin (LIPITOR) 40 MG tablet Take 1 tablet (40 mg total) by mouth daily at 6 PM. 05/09/16  Yes Arbutus Leas, NP  bimatoprost (LUMIGAN) 0.03 % ophthalmic drops Apply 1 drop to eye at bedtime.    Yes Historical Provider, MD  brimonidine (ALPHAGAN) 0.15 % ophthalmic solution Place 1 drop into the right eye 2 (two) times daily. 04/16/11  Yes Hester Mates, MD  dorzolamide (TRUSOPT) 2 % ophthalmic solution Place 1 drop into both eyes 3 (three) times daily.   Yes Historical Provider, MD  metoprolol succinate (TOPROL-XL) 50 MG 24 hr tablet TAKE ONE TABLET BY MOUTH EVERY DAY 09/30/12  Yes Minus Breeding, MD  ramipril (ALTACE) 10 MG capsule TAKE ONE CAPSULE BY MOUTH EVERY DAY 09/30/12  Yes Minus Breeding, MD  ticagrelor (BRILINTA) 90 MG TABS tablet Take 1 tablet (90 mg total) by mouth 2 (two) times daily. 05/09/16  Yes Arbutus Leas, NP  timolol (BETIMOL) 0.5 % ophthalmic solution Place 1 drop into both eyes 2 (two) times daily.   Yes Historical Provider, MD  traMADol (ULTRAM) 50 MG tablet Take 1-2 tablets (50-100 mg total) by mouth 2 (two) times daily. Take 2 tablets every morning and take 1 tablet every evening 04/16/11  Yes Hester Mates, MD  clopidogrel (PLAVIX) 75 MG tablet Take 1 tablet by mouth daily. 05/01/16   Historical Provider, MD  nitroGLYCERIN (NITROSTAT) 0.4 MG SL tablet Place 1 tablet (0.4 mg total) under the tongue every 5 (five) minutes as needed. For chest pain 09/14/12   Minus Breeding, MD    Family History Family History  Problem Relation Age of Onset  . Stroke Mother   . Stroke Father     Social History Social History  Substance Use Topics  . Smoking status: Never Smoker  . Smokeless tobacco: Never Used  . Alcohol use No      Allergies   Diphenhydramine hcl; Penicillins; and Shellfish allergy   Review of Systems Review of Systems  Cardiovascular: Positive for chest pain.  All other systems reviewed and are negative.    Physical Exam Updated Vital Signs BP 113/62   Pulse (!) 57   Temp 98.7 F (37.1 C) (Oral)   Resp 16   Ht 5\' 3"  (1.6 m)   Wt 122 lb (55.3 kg)   SpO2 98%   BMI 21.61 kg/m   Physical Exam  Constitutional: She is oriented to person, place, and time. She appears well-developed and well-nourished.  HENT:  Head: Normocephalic and atraumatic.  Cardiovascular: Normal rate and regular rhythm.   No murmur heard.  Pulmonary/Chest: Effort normal and breath sounds normal. No respiratory distress.  Mild central, lower chest wall tenderness to palpation.  Abdominal: Soft. There is no tenderness. There is no rebound and no guarding.  Musculoskeletal: She exhibits no edema or tenderness.  Neurological: She is alert and oriented to person, place, and time.  Skin: Skin is warm and dry.  Psychiatric: She has a normal mood and affect. Her behavior is normal.  Nursing note and vitals reviewed.    ED Treatments / Results  Labs (all labs ordered are listed, but only abnormal results are displayed) Labs Reviewed  BASIC METABOLIC PANEL - Abnormal; Notable for the following:       Result Value   CO2 21 (*)    Creatinine, Ser 1.03 (*)    Calcium 11.8 (*)    GFR calc non Af Amer 47 (*)    GFR calc Af Amer 55 (*)    All other components within normal limits  CBC - Abnormal; Notable for the following:    RBC 3.45 (*)    Hemoglobin 10.8 (*)    HCT 33.9 (*)    All other components within normal limits  HEPATIC FUNCTION PANEL - Abnormal; Notable for the following:    AST 246 (*)    ALT 208 (*)    Alkaline Phosphatase 374 (*)    All other components within normal limits  LIPASE, BLOOD - Abnormal; Notable for the following:    Lipase <10 (*)    All other components within normal limits   I-STAT TROPOININ, ED    EKG  EKG Interpretation  Date/Time:  Saturday June 22 2016 10:54:08 EST Ventricular Rate:  67 PR Interval:    QRS Duration: 83 QT Interval:  394 QTC Calculation: 416 R Axis:   -25 Text Interpretation:  Sinus rhythm Borderline left axis deviation Posterior infarct, old Nonspecific T abnormalities, inferior leads Baseline wander in lead(s) V6 Confirmed by Hazle Coca [phone]) on 06/22/2016 10:56:32 AM Also confirmed by Hazle Coca [phone]), editor [address] [place], [place] [phone])  on 06/22/2016 11:48:15 AM       Radiology Dg Chest 2 View  Result Date: 06/22/2016 CLINICAL DATA:  Chest pain EXAM: CHEST  2 VIEW COMPARISON:  05/07/2016 chest radiograph. FINDINGS: Stable cardiomediastinal silhouette with normal heart size and aortic atherosclerosis. No pneumothorax. No pleural effusion. Lungs appear clear, with no acute consolidative airspace disease and no pulmonary edema. Coronary stent overlies the heart. IMPRESSION: No active cardiopulmonary disease. Aortic atherosclerosis. Electronically Signed   By: Ilona Sorrel M.D.   On: 06/22/2016 11:33   US Abdomen Complete  Result Date: 06/22/2016 CLINICAL DATA:  Epigastric pain since 10 a.m. this morning. EXAM: ABDOMEN ULTRASOUND COMPLETE COMPARISON:  Abdomen and pelvis CT 03/11/2014 FINDINGS: Gallbladder: No gallstones or gallbladder wall thickening. No pericholecystic fluid. The sonographer reports no sonographic Murphy's sign. Common bile duct: Diameter: 4 mm Liver: No focal lesion identified. Within normal limits in parenchymal echogenicity. IVC: No abnormality visualized. Pancreas: Obscured by bowel gas Spleen: Size and appearance within normal limits. Right Kidney: Length: 8.1 cm. Cortex looks echogenic compared to the reference liver parenchyma. No hydronephrosis. Left Kidney: Length: 9.2 cm. 3.0 cm simple cyst identified lower pole. Simple cyst was seen in the lower pole left kidney on previous CT over 2 years ago, measuring 2.5 cm  at that time. Abdominal aorta: No aneurysm visualized. Other findings: None. IMPRESSION: No acute findings in the abdomen. Specifically, no sonographic evidence to explain the patient's history of pain. Electronically Signed  By: Misty Stanley M.D.   On: 06/22/2016 14:28    Procedures Procedures (including critical care time)  Medications Ordered in ED Medications  nitroGLYCERIN (NITROSTAT) SL tablet 0.4 mg (0.4 mg Sublingual Given 06/22/16 1145)     Initial Impression / Assessment and Plan / ED Course  I have reviewed the triage vital signs and the nursing notes.  Pertinent labs & imaging results that were available during my care of the patient were reviewed by me and considered in my medical decision making (see chart for details).     Patient with recent MI here with chest pain that has been intermittent since yesterday. Following one nitroglycerin in the emergency department her pain has completely resolved. EKG is unchanged from most recent. LFTs noted to be elevated compared to priors. Abdominal ultrasound with no acute abnormality. Plan to admit to the hospitalist service for observation given chest pain with elevated LFTs. Discussed with patient and family findings the studies recommendation for observation admission and they are in agreement with plan.  Final Clinical Impressions(s) / ED Diagnoses   Final diagnoses:  None    New Prescriptions New Prescriptions   No medications on file     Quintella Reichert, MD 06/22/16 1528

## 2016-06-22 NOTE — ED Triage Notes (Signed)
Pt started having chest pressure yesterday. Worse with palpation and inspiration. Recent MI in January. EMS gave 324mg , no nitro. BP 180/90, HR 66 Sinus rhythm.

## 2016-06-22 NOTE — ED Notes (Signed)
Patient transported to Ultrasound 

## 2016-06-22 NOTE — Progress Notes (Signed)
CRITICAL VALUE ALERT  Critical value received:  Troponin 0.07  Date of notification:  06/22/2016  Time of notification:  3664  Critical value read back:Yes.    Nurse who received alert:  Neta Ehlers, RN  MD notified (1st page):  Janne Napoleon  Time of first page:  1715  MD notified (2nd page): [place], [place]  Time of second page: 1730  Responding MD:  Gerald Dexter, [place]  Time MD responded:  [phone]

## 2016-06-22 NOTE — ED Notes (Signed)
Attempted report x1. 

## 2016-06-22 NOTE — ED Notes (Addendum)
Pt rating chest pain 6 out of 10. BP 161/80 HR 65. Nitrox1 given. Pt rating pain 4 out of 10 after nitrox1. BP 149/77 HR 66. Pt prefers not to take anymore nitro at this time due to developing a headache.

## 2016-06-23 ENCOUNTER — Encounter (HOSPITAL_COMMUNITY): Payer: Self-pay | Admitting: Radiology

## 2016-06-23 ENCOUNTER — Observation Stay (HOSPITAL_COMMUNITY): Payer: Medicare Other

## 2016-06-23 DIAGNOSIS — R1010 Upper abdominal pain, unspecified: Secondary | ICD-10-CM

## 2016-06-23 DIAGNOSIS — R935 Abnormal findings on diagnostic imaging of other abdominal regions, including retroperitoneum: Secondary | ICD-10-CM | POA: Diagnosis not present

## 2016-06-23 DIAGNOSIS — Z955 Presence of coronary angioplasty implant and graft: Secondary | ICD-10-CM

## 2016-06-23 DIAGNOSIS — R7989 Other specified abnormal findings of blood chemistry: Secondary | ICD-10-CM | POA: Diagnosis not present

## 2016-06-23 DIAGNOSIS — I200 Unstable angina: Secondary | ICD-10-CM | POA: Diagnosis not present

## 2016-06-23 LAB — CBC
HCT: 32.2 % — ABNORMAL LOW (ref 36.0–46.0)
Hemoglobin: 10.1 g/dL — ABNORMAL LOW (ref 12.0–15.0)
MCH: 30.3 pg (ref 26.0–34.0)
MCHC: 31.4 g/dL (ref 30.0–36.0)
MCV: 96.7 fL (ref 78.0–100.0)
PLATELETS: 220 10*3/uL (ref 150–400)
RBC: 3.33 MIL/uL — AB (ref 3.87–5.11)
RDW: 13.5 % (ref 11.5–15.5)
WBC: 7.8 10*3/uL (ref 4.0–10.5)

## 2016-06-23 MED ORDER — PANTOPRAZOLE SODIUM 40 MG PO TBEC
40.0000 mg | DELAYED_RELEASE_TABLET | Freq: Every day | ORAL | Status: DC
  Administered 2016-06-23 – 2016-06-25 (×3): 40 mg via ORAL
  Filled 2016-06-23 (×3): qty 1

## 2016-06-23 MED ORDER — IOPAMIDOL (ISOVUE-300) INJECTION 61%
INTRAVENOUS | Status: AC
  Administered 2016-06-23: 75 mL
  Filled 2016-06-23: qty 75

## 2016-06-23 MED ORDER — IOPAMIDOL (ISOVUE-300) INJECTION 61%
INTRAVENOUS | Status: AC
  Administered 2016-06-23: 30 mL
  Filled 2016-06-23: qty 30

## 2016-06-23 NOTE — Progress Notes (Signed)
[facility] PROGRESS NOTE  [name] UJW:[id number] DOB: 1925/05/13 DOA: 06/22/2016   PCP: Lillard Anes, MD     Assessment/Plan: Principal Problem:   Chest pain Active Problems:   HYPERLIPIDEMIA TYPE IIB / III   HYPERTENSION, BENIGN   CAD, NATIVE VESSEL   Abdominal pain   Depression   Elevated LFTs   Precordial pain    83 y.o. female with medical history significant of recent MI treated with cardiac cath andgioplasty in January 2018, HTN, hyperlipidemia with previous statin intolerance, hypothyroidism, hyperparathyroidism, depression, degenerative disc disease who presented to the emergency department with complaints of chest pain.Chest x-ray didn't show any active cardiopulmonary disease.EKG showed normal sinus rhythm without any new ischemic changes  Assessment and plan  Chest pain, atypical Cardiac enzymes flat, 3. Telemetry uneventful Continue beta blocker therapy and monitor for recurrent symptoms Continue aspirin, Brilinta, metoprolol, Altace Recent cardiac cath 05/06/16 Successful PCI and drug-eluting stent of the distal dominant RCA into the PDA for "inferior STEMI  The patient did have 60% distal left main disease as well as high grade segmental mid LAD which given her age will be treated medically.  Start PPI given dual antiplatelet therapy   Abdominal pain with transaminitis that could be associated with statin therapy intolerance Liver enzymes elevated: Numbers have increased, alkaline phosphatase now 374, AST 246, ALT 208. Bilirubin 0.7 Abdominal ultrasound didn't reveal any acute findings in the abdomen Last time liver enzymes in January 2018 were normal We will hold statin therapy for now Check hepatitis panel, CT abdomen pelvis given elevated alkaline phosphatase   Hypertension Continue home medications and adjust the doses if needed  Hyperlipidemia Patient was treated with statins before and the therapy was discontinued due to  myalgias and elevated liver enzymes in the past After PCI in January 2018 patient is started statin therapy, which now would need to be discontinued d/t  elevated LFTs Continue low-cholesterol low-fat diet    DVT prophylaxsis heparin  Code Status:  Full code     Family Communication: Discussed in detail with the patient, all imaging results, lab results explained to the patient   Disposition Plan:  Anticipate discharge tomorrow, if clinically stable      Consultants:  cardiology  Procedures:  None  Antibiotics: Anti-infectives    None         HPI/Subjective: Chest pain ,substernal ,reproducible, no RUQ pain  Objective: Vitals:   06/22/16 2300 06/23/16 0100 06/23/16 0601 06/23/16 0815  BP: (!) 99/49 (!) 101/57  111/68  Pulse:   61   Resp:      Temp:   98.5 F (36.9 C)   TempSrc:   Oral   SpO2:   100%   Weight:   45.2 kg (99 lb 9.6 oz)   Height:        Intake/Output Summary (Last 24 hours) at 06/23/16 0851 Last data filed at 06/23/16 0400  Gross per 24 hour  Intake                0 ml  Output              300 ml  Net             -300 ml    Exam:  Examination:  General exam: Appears calm and comfortable  Respiratory system: Clear to auscultation. Respiratory effort normal. Cardiovascular system: S1 & S2 heard, RRR. No JVD, murmurs, rubs, gallops or clicks. No pedal edema. Gastrointestinal  system: Abdomen is nondistended, soft and nontender. No organomegaly or masses felt. Normal bowel sounds heard. Central nervous system: Alert and oriented. No focal neurological deficits. Extremities: Symmetric 5 x 5 power. Skin: No rashes, lesions or ulcers Psychiatry: Judgement and insight appear normal. Mood & affect appropriate.     Data Reviewed: I have personally reviewed following labs and imaging studies  Micro Results No results found for this or any previous visit (from the past 240 hour(s)).  Radiology Reports Dg Chest 2 View  Result Date:  06/22/2016 CLINICAL DATA:  Chest pain EXAM: CHEST  2 VIEW COMPARISON:  05/07/2016 chest radiograph. FINDINGS: Stable cardiomediastinal silhouette with normal heart size and aortic atherosclerosis. No pneumothorax. No pleural effusion. Lungs appear clear, with no acute consolidative airspace disease and no pulmonary edema. Coronary stent overlies the heart. IMPRESSION: No active cardiopulmonary disease. Aortic atherosclerosis. Electronically Signed   By: Ilona Sorrel M.D.   On: 06/22/2016 11:33   US Abdomen Complete  Result Date: 06/22/2016 CLINICAL DATA:  Epigastric pain since 10 a.m. this morning. EXAM: ABDOMEN ULTRASOUND COMPLETE COMPARISON:  Abdomen and pelvis CT 03/11/2014 FINDINGS: Gallbladder: No gallstones or gallbladder wall thickening. No pericholecystic fluid. The sonographer reports no sonographic Murphy's sign. Common bile duct: Diameter: 4 mm Liver: No focal lesion identified. Within normal limits in parenchymal echogenicity. IVC: No abnormality visualized. Pancreas: Obscured by bowel gas Spleen: Size and appearance within normal limits. Right Kidney: Length: 8.1 cm. Cortex looks echogenic compared to the reference liver parenchyma. No hydronephrosis. Left Kidney: Length: 9.2 cm. 3.0 cm simple cyst identified lower pole. Simple cyst was seen in the lower pole left kidney on previous CT over 2 years ago, measuring 2.5 cm at that time. Abdominal aorta: No aneurysm visualized. Other findings: None. IMPRESSION: No acute findings in the abdomen. Specifically, no sonographic evidence to explain the patient's history of pain. Electronically Signed   By: Misty Stanley M.D.   On: 06/22/2016 14:28     CBC  Recent Labs Lab 06/22/16 1058 06/23/16 0542  WBC 9.3 7.8  HGB 10.8* 10.1*  HCT 33.9* 32.2*  PLT 220 220  MCV 98.3 96.7  MCH 31.3 30.3  MCHC 31.9 31.4  RDW 13.8 13.5    Chemistries   Recent Labs Lab 06/22/16 1058 06/22/16 1600  NA 139  --   K 4.4  --   CL 110  --   CO2 21*  --    GLUCOSE 91  --   BUN 18  --   CREATININE 1.03* 0.99  CALCIUM 11.8*  --   AST 246*  --   ALT 208*  --   ALKPHOS 374*  --   BILITOT 0.7  --    ------------------------------------------------------------------------------------------------------------------ estimated creatinine clearance is 28 mL/min (by C-G formula based on SCr of 0.99 mg/dL). ------------------------------------------------------------------------------------------------------------------ No results for input(s): HGBA1C in the last 72 hours. ------------------------------------------------------------------------------------------------------------------ No results for input(s): CHOL, HDL, LDLCALC, TRIG, CHOLHDL, LDLDIRECT in the last 72 hours. ------------------------------------------------------------------------------------------------------------------ No results for input(s): TSH, T4TOTAL, T3FREE, THYROIDAB in the last 72 hours.  Invalid input(s): FREET3 ------------------------------------------------------------------------------------------------------------------ No results for input(s): VITAMINB12, FOLATE, FERRITIN, TIBC, IRON, RETICCTPCT in the last 72 hours.  Coagulation profile No results for input(s): INR, PROTIME in the last 168 hours.  No results for input(s): DDIMER in the last 72 hours.  Cardiac Enzymes  Recent Labs Lab 06/22/16 1600 06/22/16 1812 06/22/16 2131  TROPONINI 0.07* 0.06* 0.06*   ------------------------------------------------------------------------------------------------------------------ Invalid input(s): POCBNP   CBG: No results for input(s): GLUCAP in  the last 168 hours.     Studies: Dg Chest 2 View  Result Date: 06/22/2016 CLINICAL DATA:  Chest pain EXAM: CHEST  2 VIEW COMPARISON:  05/07/2016 chest radiograph. FINDINGS: Stable cardiomediastinal silhouette with normal heart size and aortic atherosclerosis. No pneumothorax. No pleural effusion. Lungs appear  clear, with no acute consolidative airspace disease and no pulmonary edema. Coronary stent overlies the heart. IMPRESSION: No active cardiopulmonary disease. Aortic atherosclerosis. Electronically Signed   By: Ilona Sorrel M.D.   On: 06/22/2016 11:33   US Abdomen Complete  Result Date: 06/22/2016 CLINICAL DATA:  Epigastric pain since 10 a.m. this morning. EXAM: ABDOMEN ULTRASOUND COMPLETE COMPARISON:  Abdomen and pelvis CT 03/11/2014 FINDINGS: Gallbladder: No gallstones or gallbladder wall thickening. No pericholecystic fluid. The sonographer reports no sonographic Murphy's sign. Common bile duct: Diameter: 4 mm Liver: No focal lesion identified. Within normal limits in parenchymal echogenicity. IVC: No abnormality visualized. Pancreas: Obscured by bowel gas Spleen: Size and appearance within normal limits. Right Kidney: Length: 8.1 cm. Cortex looks echogenic compared to the reference liver parenchyma. No hydronephrosis. Left Kidney: Length: 9.2 cm. 3.0 cm simple cyst identified lower pole. Simple cyst was seen in the lower pole left kidney on previous CT over 2 years ago, measuring 2.5 cm at that time. Abdominal aorta: No aneurysm visualized. Other findings: None. IMPRESSION: No acute findings in the abdomen. Specifically, no sonographic evidence to explain the patient's history of pain. Electronically Signed   By: Misty Stanley M.D.   On: 06/22/2016 14:28      Lab Results  Component Value Date   HGBA1C 5.9 (H) 08/26/2011   Lab Results  Component Value Date   LDLCALC 177 (H) 05/06/2016   CREATININE 0.99 06/22/2016       Scheduled Meds: . aspirin EC  81 mg Oral Daily  . brimonidine  1 drop Right Eye BID  . dorzolamide  1 drop Both Eyes TID  . heparin  5,000 Units Subcutaneous Q8H  . latanoprost  1 drop Both Eyes QHS  . metoprolol succinate  50 mg Oral Daily  . ramipril  10 mg Oral Daily  . ticagrelor  90 mg Oral BID  . timolol  1 drop Both Eyes BID   Continuous Infusions:   LOS: 0  days    Time spent: >30 MINS    [facility]  [facility] Pager [phone]. If 7PM-7AM, please contact night-coverage at [email or web address], password [facility] 06/23/2016, 8:51 AM  LOS: 0 days

## 2016-06-23 NOTE — Consult Note (Signed)
Cardiology Consult    Patient ID: [name] MRN: [id number], DOB/AGE: [birth year]   Admit date: 06/22/2016 Date of Consult: 06/23/2016  Primary Physician: Lillard Anes, MD Reason for Consult: Chest Pain Primary Cardiologist: Dr. Percival Spanish Requesting Provider: Dr. Allyson Sabal   History of Present Illness    [name] is a 83 y.o. female with past medical history of CAD (s/p PCI to RCA and OM in 2003 and 2010, recent inferior STEMI with DES to RCA into proximal PDA), HTN, HLD, and PAD (s/p left fem-pop bypass) who presented to [facility] ED on 06/22/2016 for evaluation of chest pain.    Was recently admitted from 1/22 - 05/09/2016 for an acute inferior STEMI with cath showing 100% Ost RCA stenosis, 95% Dist RCA stenosis, 60% Ost LM stenosis, and 90% mid-LAD stenosis. She underwent placement of a DES to the distal RCA into the proximal PDA. High-grade LAD and LM lesion managed medically. Per Dr. Doug Sou rounding note " the patient does have residual disease in mid LAD up to 90%. This is a heavily calcified vessel. I don't think the left main disease is significant. Needs to recover from STEMI. LAD disease is a complex lesion and would require atherectomy. Given her age  will manage medically but if she has recurrent angina it would be reasonable to consider PCI of LAD."   Seen in the office for follow-up on 05/22/2016 and denied any recurrent chest discomfort or dyspnea with exertion. She was continued on ASA, Brilinta, Lipitor, Toprol-XL, and Ramipril.   In talking with the patient today, she reports developing sudden onset chest pressure while at rest yesterday. Experienced associated dyspnea. No diaphoresis, nausea, or vomiting. Symptoms were identical to what she experienced in 04/2016, therefore she called EMS. She did not take any SL NTG at home but upon being given SL NTG by EMS, her pain resolved within several minutes.   She denies any recurrent symptoms since being admitted.    Initial labs show WBC 9.3, Hgb 10.8 (at 9.6 last admission), and platelets 220. AST 246, ALT 208 (normal on 05/06/2016). Troponin values at 0.07, 0.06, and 0.06. CXR with no active cardiopulmonary disease. Abdominal US with no acute findings. EKG shows NSR, HR 67, with inferior TWI (similar to prior tracings).   Past Medical History   Past Medical History:  Diagnosis Date  . Abscess 2003   I&D SCM abscess  . Acute ischemic colitis ([place]) 04/10/11   acute episode of ischemic colitis, with abd pain, CT evidence of transverse colitis, and blood streaked BM.  Resolved with conservative treatment.  . Arthritis   . Bacteremia 2003   hx of staph bacteremia  . CAD (coronary artery disease) 2003, 2010   s/p stenting of RCA x2, s/p stent to OM;  LHC 04/2009 in the setting of a NSTEMI: EF 65%, RCA stent patent with 60% ISR proximal and 40% ISR mid, PLV proximal 50-60% and distal 50%, ostial circumflex 30-40%, proximal OM mild in-stent restenosis with a branch with 90+ percent ostial stenosis, proximal LAD 30-40%.  FFR of ostial circumflex:  0.96 - not flow limiting.  Medical therapy was continued.  . Degenerative disc disease   . Depression   . History of medication noncompliance    concerning Plavix  . HTN (hypertension)    Echocardiogram 08/26/11: Mild focal basal septal hypertrophy, EF 51-88%, grade 2 diastolic dysfunction, mild MR.  There was a fluid-filled area likely in the location of the liver noted  . Hyperlipidemia   .  Hyperparathyroidism 2003   parathyroid adenoma localized to the right inferior thyroid lobe region  . Hypothyroidism   . Lumbar stenosis with neurogenic claudication 2006   s/p decompression L2-L5  . Osteoporosis 2009   diagnosed by bone density scan in 2009, on Vit D only, no bisphosphonate therapy  . Phlebitis    LLE  . PVD (peripheral vascular disease) (HCC)    s/p left fem-pop bypass graft  . Renal artery stenosis (HCC)    left, 40%  . TIA (transient ischemic  attack)    carotid dopplers 5/13: no ICA stenosis    Past Surgical History:  Procedure Laterality Date  . APPENDECTOMY    . BACK SURGERY  06/2004   L3, L4 lami; L2, L5 hemilami; decompresion L2-3, 3-4, 4-5  . CARDIAC CATHETERIZATION N/A 05/06/2016   Procedure: Left Heart Cath and Coronary Angiography;  Surgeon: Lorretta Harp, MD;  Location: [facility];  Service: Cardiovascular;  Laterality: N/A;  . CARDIAC CATHETERIZATION N/A 05/06/2016   Procedure: Coronary Stent Intervention;  Surgeon: Lorretta Harp, MD;  Location: [facility];  Service: Cardiovascular;  Laterality: N/A;  RCA  . CORONARY ANGIOPLASTY WITH STENT PLACEMENT  09/2008  . CORONARY ANGIOPLASTY WITH STENT PLACEMENT  04/2001  . FEMORAL BYPASS  before 2003   LLE  . INCISION AND DRAINAGE ANTERIOR NECK  10/2001   absces sternoclavicular joint     Allergies  Allergies  Allergen Reactions  . Diphenhydramine Hcl Other (See Comments)    unknown  . Penicillins Other (See Comments)    unknown  . Shellfish Allergy Other (See Comments)    unknown    Inpatient Medications    . aspirin EC  81 mg Oral Daily  . brimonidine  1 drop Right Eye BID  . dorzolamide  1 drop Both Eyes TID  . heparin  5,000 Units Subcutaneous Q8H  . latanoprost  1 drop Both Eyes QHS  . metoprolol succinate  50 mg Oral Daily  . ramipril  10 mg Oral Daily  . ticagrelor  90 mg Oral BID  . timolol  1 drop Both Eyes BID    Family History    Family History  Problem Relation Age of Onset  . Stroke Mother   . Stroke Father     Social History    Social History   Social History  . Marital status: Widowed    Spouse name: N/A  . Number of children: N/A  . Years of education: N/A   Occupational History  . Not on file.   Social History Main Topics  . Smoking status: Never Smoker  . Smokeless tobacco: Never Used  . Alcohol use No  . Drug use: No  . Sexual activity: No   Other Topics Concern  . Not on file   Social History  Narrative   Lives with daughter and granddaughter    PCP: Dr. Reinaldo Meeker in [place]           Review of Systems    General:  No chills, fever, night sweats or weight changes.  Cardiovascular:  No dyspnea on exertion, edema, orthopnea, palpitations, paroxysmal nocturnal dyspnea. Positive for chest pain.  Dermatological: No rash, lesions/masses Respiratory: No cough, dyspnea Urologic: No hematuria, dysuria Abdominal:   No nausea, vomiting, diarrhea, bright red blood per rectum, melena, or hematemesis Neurologic:  No visual changes, wkns, changes in mental status. All other systems reviewed and are otherwise negative except as noted above.  Physical Exam  Blood pressure (!) 101/57, pulse 61, temperature 98.5 F (36.9 C), temperature source Oral, resp. rate 18, height 5\' 3"  (1.6 m), weight 99 lb 9.6 oz (45.2 kg), SpO2 100 %.  General: Pleasant, elderly African American female appearing in NAD Psych: Normal affect. Neuro: Alert and oriented X 3. Moves all extremities spontaneously. HEENT: Normal  Neck: Supple without bruits or JVD. Lungs:  Resp regular and unlabored, CTA without wheezing or rales. Heart: RRR no s3, s4, or murmurs. Abdomen: Soft, non-tender, non-distended, BS + x 4.  Extremities: No clubbing, cyanosis or edema. DP/PT/Radials 2+ and equal bilaterally.  Labs    Troponin [facility] Test)  Recent Labs  06/22/16 1106  TROPIPOC 0.02    Recent Labs  06/22/16 1600 06/22/16 1812 06/22/16 2131  TROPONINI 0.07* 0.06* 0.06*   Lab Results  Component Value Date   WBC 7.8 06/23/2016   HGB 10.1 (L) 06/23/2016   HCT 32.2 (L) 06/23/2016   MCV 96.7 06/23/2016   PLT 220 06/23/2016    Recent Labs Lab 06/22/16 1058 06/22/16 1600  NA 139  --   K 4.4  --   CL 110  --   CO2 21*  --   BUN 18  --   CREATININE 1.03* 0.99  CALCIUM 11.8*  --   PROT 7.6  --   BILITOT 0.7  --   ALKPHOS 374*  --   ALT 208*  --   AST 246*  --   GLUCOSE 91  --    Lab  Results  Component Value Date   CHOL 260 (H) 05/06/2016   HDL 70 05/06/2016   LDLCALC 177 (H) 05/06/2016   TRIG 67 05/06/2016   No results found for: [facility]   Radiology Studies    Dg Chest 2 View  Result Date: 06/22/2016 CLINICAL DATA:  Chest pain EXAM: CHEST  2 VIEW COMPARISON:  05/07/2016 chest radiograph. FINDINGS: Stable cardiomediastinal silhouette with normal heart size and aortic atherosclerosis. No pneumothorax. No pleural effusion. Lungs appear clear, with no acute consolidative airspace disease and no pulmonary edema. Coronary stent overlies the heart. IMPRESSION: No active cardiopulmonary disease. Aortic atherosclerosis. Electronically Signed   By: Ilona Sorrel M.D.   On: 06/22/2016 11:33   US Abdomen Complete  Result Date: 06/22/2016 CLINICAL DATA:  Epigastric pain since 10 a.m. this morning. EXAM: ABDOMEN ULTRASOUND COMPLETE COMPARISON:  Abdomen and pelvis CT 03/11/2014 FINDINGS: Gallbladder: No gallstones or gallbladder wall thickening. No pericholecystic fluid. The sonographer reports no sonographic Murphy's sign. Common bile duct: Diameter: 4 mm Liver: No focal lesion identified. Within normal limits in parenchymal echogenicity. IVC: No abnormality visualized. Pancreas: Obscured by bowel gas Spleen: Size and appearance within normal limits. Right Kidney: Length: 8.1 cm. Cortex looks echogenic compared to the reference liver parenchyma. No hydronephrosis. Left Kidney: Length: 9.2 cm. 3.0 cm simple cyst identified lower pole. Simple cyst was seen in the lower pole left kidney on previous CT over 2 years ago, measuring 2.5 cm at that time. Abdominal aorta: No aneurysm visualized. Other findings: None. IMPRESSION: No acute findings in the abdomen. Specifically, no sonographic evidence to explain the patient's history of pain. Electronically Signed   By: Misty Stanley M.D.   On: 06/22/2016 14:28    EKG & Cardiac Imaging    EKG:  NSR, HR 67, with inferior TWI (similar to prior  tracings) - Personally Reviewed  Echocardiogram: 05/08/2016 Study Conclusions  - Left ventricle: Systolic function was normal. The estimated   ejection fraction was  in the range of 50% to 55%. Basal to   mid-inferior wall hypokinesis. Doppler parameters are consistent   with abnormal left ventricular relaxation (grade 1 diastolic   dysfunction). The E/e&' ratio is >15, suggesting elevated LV   filling pressure. - Aortic valve: Trileaflet. Sclerosis without stenosis. There was   no regurgitation. - Mitral valve: Mildly thickened leaflets with ischemic tethering   of the posterior leaflet. There was mild, posteriorly directed   regurgitation. - Left atrium: The atrium was normal in size. - Tricuspid valve: There was trivial regurgitation. - Pulmonary arteries: PA peak pressure: 12 mm Hg (S). - Inferior vena cava: The vessel was normal in size. The   respirophasic diameter changes were in the normal range (>= 50%),   consistent with normal central venous pressure.  Impressions:  - Compared to a prior study in 2013, there is now basal to mid   inferior hypokinesis. The LVEF is low normal at 50-55% with grade   1 DD and elevated LV filling pressures. There is at least mild,   posteriorly directed MR - with ischemic tethetering of the   posterior leaflet.  Cardiac Catheterization: 05/06/2016  Ost LM to LM lesion, 60 %stenosed.  Prox Cx to Mid Cx lesion, 0 %stenosed.  Mid LAD lesion, 90 %stenosed.  Ost RCA to Dist RCA lesion, 100 %stenosed.  Dist RCA lesion, 95 %stenosed.  Post intervention, there is a 0% residual stenosis.  A stent was successfully placed.  There is mild left ventricular systolic dysfunction.  LV end diastolic pressure is mildly elevated.  The left ventricular ejection fraction is 50-55% by visual estimate.  IMPRESSION: Successful PCI and drug-eluting stent of the distal dominant RCA into the PDA for "inferior STEMI with a door to balloon time of 25  minutes. There was a moderate thrombus burden. I postdilated the entire stented segment in the proximal RCA down to the distal RCA. The patient did have 60% distal left main disease as well as high grade segmental mid LAD which given her age will be treated medically. We will continue dual antibiotic therapy. Angiomax continued for 4 hours and then be discontinued. A 2-D echo will be obtained. The patient left the lab in stable condition.  Assessment & Plan    1. Unstable Angina/ CAD - s/p PCI to RCA and OM in 2003 and 2010, recent inferior STEMI in 04/2016. Cath at that time showed 100% Ost RCA stenosis, 95% Dist RCA stenosis, 60% Ost LM stenosis, and 90% mid-LAD stenosis. She underwent placement of a DES to the distal RCA into the proximal PDA. High-grade LAD and LM lesion managed medically. - Per Dr. Doug Sou rounding note "the patient does have residual disease in mid LAD up to 90%. This is a heavily calcified vessel. I don't think the left main disease is significant. Needs to recover from STEMI. LAD disease is a complex lesion and would require atherectomy. Given her age  will manage medically but if she has recurrent angina it would be reasonable to consider PCI of LAD."  - developed recurrent chest pain on 3/10 which was similar to what she experienced in 04/2016. Relieved following 2 SL NTG. Denies any recurrent symptoms since being admitted.  - Troponin values flat at 0.07, 0.06, and 0.06.  - will discuss with Dr. Caryl Comes plans about repeat cath in the setting of her recurrent symptoms and known 90% LAD stenosis. Would recommend initiation of Imdur, but with soft BP, may need to decrease Ramipril dosing.  -  continue ASA, Brilinta, and BB.   2. HTN - BP variable at 99/49 -188/80 in the past 24 hours.  - continue Toprol-XL 50mg  daily and Ramipril 10mg  daily.   3. HLD - statin held in the setting of elevated LFT's.   4. Transaminitis  - AST 246, ALT 208 (normal on 05/06/2016).  - abdominal US  with no acute findings. - statin appropriately held with elevated LFT's.  - recommend GI consult.   5. Chronic Anemia - Hgb stable at 10.8 this admission.   Signed, Erma Heritage, PA-C 06/23/2016, 7:49 AM Pager: [phone]  History reviewed. Patient seen and examined and agree with the above.  Patient with known coronary disease and recent PCI known residual disease that was felt to be complicated and not easily amenable. However, this presentation while accompanied by similar discomfort has also been preceded by postprandial discomfort and she now has significant transaminitis. We have reviewed the literature on acute hepatitis associated with statin therapy. It occurs in about 1/100,000. That makes it an unlikely culprit in this situation and as noted above would recommend GI consultation prior to proceeding with further cardiac evaluation.

## 2016-06-24 DIAGNOSIS — F329 Major depressive disorder, single episode, unspecified: Secondary | ICD-10-CM | POA: Diagnosis present

## 2016-06-24 DIAGNOSIS — K862 Cyst of pancreas: Secondary | ICD-10-CM | POA: Diagnosis present

## 2016-06-24 DIAGNOSIS — M810 Age-related osteoporosis without current pathological fracture: Secondary | ICD-10-CM | POA: Diagnosis present

## 2016-06-24 DIAGNOSIS — I2510 Atherosclerotic heart disease of native coronary artery without angina pectoris: Secondary | ICD-10-CM | POA: Diagnosis not present

## 2016-06-24 DIAGNOSIS — Z955 Presence of coronary angioplasty implant and graft: Secondary | ICD-10-CM | POA: Diagnosis not present

## 2016-06-24 DIAGNOSIS — Z823 Family history of stroke: Secondary | ICD-10-CM | POA: Diagnosis not present

## 2016-06-24 DIAGNOSIS — I25110 Atherosclerotic heart disease of native coronary artery with unstable angina pectoris: Secondary | ICD-10-CM | POA: Diagnosis present

## 2016-06-24 DIAGNOSIS — I200 Unstable angina: Secondary | ICD-10-CM | POA: Diagnosis not present

## 2016-06-24 DIAGNOSIS — R740 Nonspecific elevation of levels of transaminase and lactic acid dehydrogenase [LDH]: Secondary | ICD-10-CM | POA: Diagnosis present

## 2016-06-24 DIAGNOSIS — Z7902 Long term (current) use of antithrombotics/antiplatelets: Secondary | ICD-10-CM | POA: Diagnosis not present

## 2016-06-24 DIAGNOSIS — Z8672 Personal history of thrombophlebitis: Secondary | ICD-10-CM | POA: Diagnosis not present

## 2016-06-24 DIAGNOSIS — E039 Hypothyroidism, unspecified: Secondary | ICD-10-CM | POA: Diagnosis present

## 2016-06-24 DIAGNOSIS — R001 Bradycardia, unspecified: Secondary | ICD-10-CM | POA: Diagnosis present

## 2016-06-24 DIAGNOSIS — R945 Abnormal results of liver function studies: Secondary | ICD-10-CM | POA: Diagnosis not present

## 2016-06-24 DIAGNOSIS — I208 Other forms of angina pectoris: Secondary | ICD-10-CM

## 2016-06-24 DIAGNOSIS — I739 Peripheral vascular disease, unspecified: Secondary | ICD-10-CM | POA: Diagnosis present

## 2016-06-24 DIAGNOSIS — E785 Hyperlipidemia, unspecified: Secondary | ICD-10-CM | POA: Diagnosis present

## 2016-06-24 DIAGNOSIS — R935 Abnormal findings on diagnostic imaging of other abdominal regions, including retroperitoneum: Secondary | ICD-10-CM | POA: Diagnosis not present

## 2016-06-24 DIAGNOSIS — D649 Anemia, unspecified: Secondary | ICD-10-CM | POA: Diagnosis present

## 2016-06-24 DIAGNOSIS — I10 Essential (primary) hypertension: Secondary | ICD-10-CM | POA: Diagnosis not present

## 2016-06-24 DIAGNOSIS — R938 Abnormal findings on diagnostic imaging of other specified body structures: Secondary | ICD-10-CM | POA: Diagnosis not present

## 2016-06-24 DIAGNOSIS — Z8673 Personal history of transient ischemic attack (TIA), and cerebral infarction without residual deficits: Secondary | ICD-10-CM | POA: Diagnosis not present

## 2016-06-24 DIAGNOSIS — I701 Atherosclerosis of renal artery: Secondary | ICD-10-CM | POA: Diagnosis present

## 2016-06-24 DIAGNOSIS — R072 Precordial pain: Secondary | ICD-10-CM

## 2016-06-24 DIAGNOSIS — I2584 Coronary atherosclerosis due to calcified coronary lesion: Secondary | ICD-10-CM | POA: Diagnosis present

## 2016-06-24 DIAGNOSIS — I252 Old myocardial infarction: Secondary | ICD-10-CM | POA: Diagnosis not present

## 2016-06-24 DIAGNOSIS — Z79899 Other long term (current) drug therapy: Secondary | ICD-10-CM | POA: Diagnosis not present

## 2016-06-24 DIAGNOSIS — Z7982 Long term (current) use of aspirin: Secondary | ICD-10-CM | POA: Diagnosis not present

## 2016-06-24 DIAGNOSIS — R0789 Other chest pain: Secondary | ICD-10-CM | POA: Diagnosis not present

## 2016-06-24 LAB — COMPREHENSIVE METABOLIC PANEL
ALK PHOS: 360 U/L — AB (ref 38–126)
ALT: 212 U/L — AB (ref 14–54)
AST: 249 U/L — AB (ref 15–41)
Albumin: 3.2 g/dL — ABNORMAL LOW (ref 3.5–5.0)
Anion gap: 6 (ref 5–15)
BUN: 18 mg/dL (ref 6–20)
CALCIUM: 11.3 mg/dL — AB (ref 8.9–10.3)
CHLORIDE: 112 mmol/L — AB (ref 101–111)
CO2: 21 mmol/L — AB (ref 22–32)
CREATININE: 1.02 mg/dL — AB (ref 0.44–1.00)
GFR calc Af Amer: 55 mL/min — ABNORMAL LOW (ref >60)
GFR, EST NON AFRICAN AMERICAN: 48 mL/min — AB (ref >60)
Glucose, Bld: 101 mg/dL — ABNORMAL HIGH (ref 65–99)
Potassium: 3.8 mmol/L (ref 3.5–5.1)
Sodium: 139 mmol/L (ref 135–145)
Total Bilirubin: 0.6 mg/dL (ref 0.3–1.2)
Total Protein: 6.9 g/dL (ref 6.5–8.1)

## 2016-06-24 LAB — HEPATITIS PANEL, ACUTE
HCV Ab: <0.1 s/co ratio (ref 0.0–0.9)
HEP B C IGM: NEGATIVE
Hep A IgM: NEGATIVE
Hepatitis B Surface Ag: NEGATIVE

## 2016-06-24 NOTE — Care Management Obs Status (Signed)
[place] NOTIFICATION   Patient Details  Name: [name] MRN: [id number] Date of Birth: 1926-02-17   Medicare Observation Status Notification Given:  Yes    Bethena Roys, RN 06/24/2016, 11:32 AM

## 2016-06-24 NOTE — Care Management Note (Signed)
Case Management Note  Patient Details  Name: SPIRIT WERNLI MRN: [id number] Date of Birth: [birth year]  Subjective/Objective:    Pt presented for chest pain. Pt is from home alone. Pt has support of family that takes her to appointments. Pt has DME RW and [place]. Pt states she does well at home. No home needs identified.                Action/Plan: No needs from CM at this time.   Expected Discharge Date:                  Expected Discharge Plan:  Home/Self Care  In-House Referral:  NA  Discharge planning Services  CM Consult  Post Acute Care Choice:  NA Choice offered to:  NA  DME Arranged:  N/A DME Agency:  NA  HH Arranged:  NA HH Agency:  NA  Status of Service:  Completed, signed off  If discussed at [place] of Stay Meetings, dates discussed:    Additional Comments:  Bethena Roys, RN 06/24/2016, 11:33 AM

## 2016-06-24 NOTE — Progress Notes (Signed)
Progress Note  Patient Name: [name] Date of Encounter: 06/24/2016  Primary Cardiologist: Dr. Percival Spanish   Subjective   Feels weak. No chest pain, shortness of breath or dyspnea. No further abdominal pain.   Inpatient Medications    Scheduled Meds: . aspirin EC  81 mg Oral Daily  . brimonidine  1 drop Right Eye BID  . dorzolamide  1 drop Both Eyes TID  . heparin  5,000 Units Subcutaneous Q8H  . latanoprost  1 drop Both Eyes QHS  . metoprolol succinate  50 mg Oral Daily  . pantoprazole  40 mg Oral Daily  . ramipril  10 mg Oral Daily  . ticagrelor  90 mg Oral BID  . timolol  1 drop Both Eyes BID   Continuous Infusions:  PRN Meds: gi cocktail, morphine injection, nitroGLYCERIN, ondansetron (ZOFRAN) IV, traMADol, zolpidem   Vital Signs    Vitals:   06/23/16 1410 06/23/16 2257 06/24/16 0400 06/24/16 0547  BP: 108/61 132/73  116/62  Pulse: 63 64  61  Resp: 18     Temp: 98.3 F (36.8 C) 98.6 F (37 C)  98.1 F (36.7 C)  TempSrc: Oral Oral  Oral  SpO2: 98% 98%  100%  Weight:   97 lb 8 oz (44.2 kg)   Height:        Intake/Output Summary (Last 24 hours) at 06/24/16 0802 Last data filed at 06/24/16 0400  Gross per 24 hour  Intake              840 ml  Output              475 ml  Net              365 ml   Filed Weights   06/22/16 1600 06/23/16 0601 06/24/16 0400  Weight: 100 lb (45.4 kg) 99 lb 9.6 oz (45.2 kg) 97 lb 8 oz (44.2 kg)    Telemetry    Sinus rhythm - Personally Reviewed  ECG    N/A  Physical Exam   GEN: Thin frail woman in no acute distress.   Neck: No JVD Cardiac: RRR, no murmurs, rubs, or gallops.  Respiratory: Clear to auscultation bilaterally. GI: Soft, nontender, non-distended  MS: No edema; No deformity. Neuro:  Nonfocal  Psych: Normal affect   Labs    Chemistry Recent Labs Lab 06/22/16 1058 06/22/16 1600 06/24/16 0539  NA 139  --  139  K 4.4  --  3.8  CL 110  --  112*  CO2 21*  --  21*  GLUCOSE 91  --  101*  BUN  18  --  18  CREATININE 1.03* 0.99 1.02*  CALCIUM 11.8*  --  11.3*  PROT 7.6  --  6.9  ALBUMIN 3.6  --  3.2*  AST 246*  --  249*  ALT 208*  --  212*  ALKPHOS 374*  --  360*  BILITOT 0.7  --  0.6  GFRNONAA 47* 49* 48*  GFRAA 55* 57* 55*  ANIONGAP 8  --  6     Hematology Recent Labs Lab 06/22/16 1058 06/23/16 0542  WBC 9.3 7.8  RBC 3.45* 3.33*  HGB 10.8* 10.1*  HCT 33.9* 32.2*  MCV 98.3 96.7  MCH 31.3 30.3  MCHC 31.9 31.4  RDW 13.8 13.5  PLT 220 220    Cardiac Enzymes Recent Labs Lab 06/22/16 1600 06/22/16 1812 06/22/16 2131  TROPONINI 0.07* 0.06* 0.06*    Recent Labs Lab  06/22/16 1106  TROPIPOC 0.02     BNPNo results for input(s): BNP, PROBNP in the last 168 hours.   DDimer No results for input(s): DDIMER in the last 168 hours.   Radiology    Dg Chest 2 View  Result Date: 06/22/2016 CLINICAL DATA:  Chest pain EXAM: CHEST  2 VIEW COMPARISON:  05/07/2016 chest radiograph. FINDINGS: Stable cardiomediastinal silhouette with normal heart size and aortic atherosclerosis. No pneumothorax. No pleural effusion. Lungs appear clear, with no acute consolidative airspace disease and no pulmonary edema. Coronary stent overlies the heart. IMPRESSION: No active cardiopulmonary disease. Aortic atherosclerosis. Electronically Signed   By: Ilona Sorrel M.D.   On: 06/22/2016 11:33   US Abdomen Complete  Result Date: 06/22/2016 CLINICAL DATA:  Epigastric pain since 10 a.m. this morning. EXAM: ABDOMEN ULTRASOUND COMPLETE COMPARISON:  Abdomen and pelvis CT 03/11/2014 FINDINGS: Gallbladder: No gallstones or gallbladder wall thickening. No pericholecystic fluid. The sonographer reports no sonographic Murphy's sign. Common bile duct: Diameter: 4 mm Liver: No focal lesion identified. Within normal limits in parenchymal echogenicity. IVC: No abnormality visualized. Pancreas: Obscured by bowel gas Spleen: Size and appearance within normal limits. Right Kidney: Length: 8.1 cm. Cortex looks  echogenic compared to the reference liver parenchyma. No hydronephrosis. Left Kidney: Length: 9.2 cm. 3.0 cm simple cyst identified lower pole. Simple cyst was seen in the lower pole left kidney on previous CT over 2 years ago, measuring 2.5 cm at that time. Abdominal aorta: No aneurysm visualized. Other findings: None. IMPRESSION: No acute findings in the abdomen. Specifically, no sonographic evidence to explain the patient's history of pain. Electronically Signed   By: Misty Stanley M.D.   On: 06/22/2016 14:28   Ct Abdomen Pelvis W Contrast  Result Date: 06/23/2016 CLINICAL DATA:  Abnormal liver function tests. EXAM: CT ABDOMEN AND PELVIS WITH CONTRAST TECHNIQUE: Multidetector CT imaging of the abdomen and pelvis was performed using the standard protocol following bolus administration of intravenous contrast. CONTRAST:  49mL ISOVUE-300 IOPAMIDOL (ISOVUE-300) INJECTION 61% COMPARISON:  CT of the abdomen and pelvis with contrast 03/11/2014 FINDINGS: Lower chest: The lung bases are clear. The heart size normal. No significant pleural or pericardial effusion is present. Hepatobiliary: There is mild diffuse fatty infiltration of liver. No discrete hepatic lesions are present. Mild prominence of the common bile duct is stable. The gallbladder is normal. Pancreas: Ill-defined low-density mass is present at the head of the pancreas measuring 11 x 18 mm. This is increased in size since prior exam. The more distal pancreas is unremarkable. No significant ductal dilation is present. Spleen: Negative Adrenals/Urinary Tract: Chronic fullness of the adrenal glands is present bilaterally without discrete lesions. A 3.1 cm exophytic cyst is present at the lower pole of the left kidney. The kidneys and ureters are otherwise unremarkable. The urinary bladder is within normal limits. Stomach/Bowel: Stomach and duodenum are within normal limits. The small bowel is unremarkable. The appendix is visualized and normal. The ascending  and transverse colon are within normal limits. The descending colon is unremarkable. The sigmoid colon is within normal limits. Vascular/Lymphatic: Aortic atherosclerosis. No enlarged abdominal or pelvic lymph nodes. Reproductive: Uterus and bilateral adnexa are unremarkable. Other: No significant free fluid or free air is present. No abdominal wall hernia is noted. Musculoskeletal: Levoconvex curvature of the lumbar spine is centered at L2 without significant interval change. There marked endplate changes. Degenerative changes are noted in the SI joints. Pelvis is otherwise intact. The hips are located. IMPRESSION: 1. Chronic dilated common bile  duct with ill-defined mass lesion at the head of the pancreas. Recommend MRI of the pancreas with and without contrast for GI consult for further evaluation. 2. No discrete hepatic lesions. 3. Extensive atherosclerotic change. 4. Severe scoliosis with chronic marked degenerative change. Electronically Signed   By: San Morelle M.D.   On: 06/23/2016 21:05    Cardiac Studies   Echocardiogram: 05/08/2016 Study Conclusions  - Left ventricle: Systolic function was normal. The estimated ejection fraction was in the range of 50% to 55%. Basal to mid-inferior wall hypokinesis. Doppler parameters are consistent with abnormal left ventricular relaxation (grade 1 diastolic dysfunction). The E/e&' ratio is >15, suggesting elevated LV filling pressure. - Aortic valve: Trileaflet. Sclerosis without stenosis. There was no regurgitation. - Mitral valve: Mildly thickened leaflets with ischemic tethering of the posterior leaflet. There was mild, posteriorly directed regurgitation. - Left atrium: The atrium was normal in size. - Tricuspid valve: There was trivial regurgitation. - Pulmonary arteries: PA peak pressure: 12 mm Hg (S). - Inferior vena cava: The vessel was normal in size. The respirophasic diameter changes were in the normal range  (>= 50%), consistent with normal central venous pressure.  Impressions:  - Compared to a prior study in 2013, there is now basal to mid inferior hypokinesis. The LVEF is low normal at 50-55% with grade 1 DD and elevated LV filling pressures. There is at least mild, posteriorly directed MR - with ischemic tethetering of the posterior leaflet.  Cardiac Catheterization: 05/06/2016  Ost LM to LM lesion, 60 %stenosed.  Prox Cx to Mid Cx lesion, 0 %stenosed.  Mid LAD lesion, 90 %stenosed.  Ost RCA to Dist RCA lesion, 100 %stenosed.  Dist RCA lesion, 95 %stenosed.  Post intervention, there is a 0% residual stenosis.  A stent was successfully placed.  There is mild left ventricular systolic dysfunction.  LV end diastolic pressure is mildly elevated.  The left ventricular ejection fraction is 50-55% by visual estimate.  IMPRESSION:Successful PCI and drug-eluting stent of the distal dominant RCA into the PDA for "inferior STEMI with adoor to balloon time of 25 minutes. There was a moderate thrombus burden. I postdilated the entire stented segment in the proximal RCA down to the distal RCA. The patient did have 60% distal left main disease as well as high grade segmental mid LAD which given her age will be treated medically. We will continue dual antibiotic therapy. Angiomax continued for 4 hours and then be discontinued. A 2-D echo will be obtained. The patient left the lab in stable condition.  Patient Profile     [name] is a 83 y.o. female with past medical history of CAD (s/p PCI to RCA and OM in 2003 and 2010, recent inferior STEMI 04/2016 with DES to RCA into proximal PDA (medical therapy for 60% Ost LM and 90% mid LAD) , HTN, HLD, and PAD (s/p left fem-pop bypass) who presented to [facility] ED on 06/22/2016 for evaluation of chest pain.    Assessment & Plan     1. Unstable angina/CAD -  s/p PCI to RCA and OM in 2003 and 2010, recent inferior STEMI in  04/2016 with DES to RCA into proximal PDA (medical therapy for 60% Ost LM and 90% mid LAD)  - Presentation at this admission similar however in setting of acute hepatitis (elevated transaminitis). Troponin values flat at 0.07, 0.06, and 0.06. Seems demand ischemia. EKG without acute changes.  - Likely no cardiac intervention this admission. Dr. Gwenlyn Found to see the  patient who did last cath. Patient has follow up with Dr. Percival Spanish 07/12/16.  2. HTN - BP improved. Continue current therapy.   3. Transaminitis  - AST 246, ALT 208 (normal on 05/06/2016).  - abdominal US with no acute findings. CT of abdomen showed chronic dilated common bile duct with ill-defined mass lesion at the head of the pancreas. Per Primary team  4. HLD - Hold statin as above. 05/06/2016: Cholesterol 260; HDL 70; LDL Cholesterol 177; Triglycerides 67; VLDL 13  Signed, Bhagat,Bhavinkumar, PA  06/24/2016, 8:02 AM  Agree with note by Robbie Lis PA-C  83 year old frail female with recent inferior STEMI at intervention by myself in January of this year. I opened up and occluded long stented segment in the dominant RCA and stent to the distal RCA into the PDA. She did have moderate distal left main disease with high-grade calcified mid LAD disease. She was admitted with abdominal/lower chest pain this admission. Her enzymes were low and flat. EKG showed no acute changes. Her liver function tests were elevated and a CT scan showed a mass at the head of the pancreas. At this point, given her low enzymes with the recurrent chest pain I favor conservative therapy. Should she have continued symptoms consideration should be given to intervention on the mid LAD. I'm concerned that she may have a neoplastic process in her abdomen which will need to be further worked up however the workup to be mitigated by her age and comorbidities.  Lorretta Harp, M.D., [place], [facility], [place] [address] [place], [place]  [address]  [phone] 06/24/2016 10:02 AM

## 2016-06-24 NOTE — Consult Note (Addendum)
Referring Provider: Dr. Allyson Sabal  Primary Care Physician:  Cindy Anes, MD Primary Gastroenterologist: Althia Forts  Reason for Consultation:  [name] CT/pancreatic mass  HPI: [name] is a 83 y.o. female with history of oriented disease with recent MI requiring PCI in January 2018, hypothyroidism was admitted to the hospital with chest pain and substernal discomfort Patient was seen by cardiology and currently being treated for unstable angina. Patient was also found to have abnormal LFTs. CT scan abdomen pelvis was ordered which showed possible pancreatic mass. GI is consulted for further evaluation.  Patient seen and examined. Her chest pain is resolved. Denied any abdominal pain, nausea or vomiting. Tolerating diet. Denied any diarrhea or constipation. She is not sure about weight loss.  A previous colonoscopy. No family history of colon cancer or pancreatic cancer.   Past Medical History:  Diagnosis Date  . Abscess 2003   I&D SCM abscess  . Acute ischemic colitis ([place]) 04/10/11   acute episode of ischemic colitis, with abd pain, CT evidence of transverse colitis, and blood streaked BM.  Resolved with conservative treatment.  . Arthritis   . Bacteremia 2003   hx of staph bacteremia  . CAD (coronary artery disease) 2003, 2010   s/p stenting of RCA x2, s/p stent to OM;  LHC 04/2009 in the setting of a NSTEMI: EF 65%, RCA stent patent with 60% ISR proximal and 40% ISR mid, PLV proximal 50-60% and distal 50%, ostial circumflex 30-40%, proximal OM mild in-stent restenosis with a branch with 90+ percent ostial stenosis, proximal LAD 30-40%.  FFR of ostial circumflex:  0.96 - not flow limiting.  Medical therapy was continued.  . Degenerative disc disease   . Depression   . History of medication noncompliance    concerning Plavix  . HTN (hypertension)    Echocardiogram 08/26/11: Mild focal basal septal hypertrophy, EF 67-89%, grade 2 diastolic dysfunction, mild MR.  There was a  fluid-filled area likely in the location of the liver noted  . Hyperlipidemia   . Hyperparathyroidism 2003   parathyroid adenoma localized to the right inferior thyroid lobe region  . Hypothyroidism   . Lumbar stenosis with neurogenic claudication 2006   s/p decompression L2-L5  . Osteoporosis 2009   diagnosed by bone density scan in 2009, on Vit D only, no bisphosphonate therapy  . Phlebitis    LLE  . PVD (peripheral vascular disease) ([facility])    s/p left fem-pop bypass graft  . Renal artery stenosis ([facility])    left, 40%  . TIA (transient ischemic attack)    carotid dopplers 5/13: no ICA stenosis    Past Surgical History:  Procedure Laterality Date  . APPENDECTOMY    . BACK SURGERY  06/2004   L3, L4 lami; L2, L5 hemilami; decompresion L2-3, 3-4, 4-5  . CARDIAC CATHETERIZATION N/A 05/06/2016   Procedure: Left Heart Cath and Coronary Angiography;  Surgeon: Lorretta Harp, MD;  Location: [facility];  Service: Cardiovascular;  Laterality: N/A;  . CARDIAC CATHETERIZATION N/A 05/06/2016   Procedure: Coronary Stent Intervention;  Surgeon: Lorretta Harp, MD;  Location: [facility];  Service: Cardiovascular;  Laterality: N/A;  RCA  . CORONARY ANGIOPLASTY WITH STENT PLACEMENT  09/2008  . CORONARY ANGIOPLASTY WITH STENT PLACEMENT  04/2001  . FEMORAL BYPASS  before 2003   LLE  . INCISION AND DRAINAGE ANTERIOR NECK  10/2001   absces sternoclavicular joint    Prior to Admission medications   Medication Sig Start Date End  Date Taking? Authorizing Provider  acetaminophen (TYLENOL) 500 MG tablet Take 500 mg by mouth every 6 (six) hours as needed for mild pain.   Yes Historical Provider, MD  aspirin 81 MG tablet Take 1 tablet (81 mg total) by mouth daily. 08/21/12  Yes Minus Breeding, MD  atorvastatin (LIPITOR) 40 MG tablet Take 1 tablet (40 mg total) by mouth daily at 6 PM. 05/09/16  Yes Arbutus Leas, NP  bimatoprost (LUMIGAN) 0.03 % ophthalmic drops Apply 1 drop to eye at bedtime.     Yes Historical Provider, MD  brimonidine (ALPHAGAN) 0.15 % ophthalmic solution Place 1 drop into the right eye 2 (two) times daily. 04/16/11  Yes Hester Mates, MD  dorzolamide (TRUSOPT) 2 % ophthalmic solution Place 1 drop into both eyes 3 (three) times daily.   Yes Historical Provider, MD  metoprolol succinate (TOPROL-XL) 50 MG 24 hr tablet TAKE ONE TABLET BY MOUTH EVERY DAY 09/30/12  Yes Minus Breeding, MD  ramipril (ALTACE) 10 MG capsule TAKE ONE CAPSULE BY MOUTH EVERY DAY 09/30/12  Yes Minus Breeding, MD  ticagrelor (BRILINTA) 90 MG TABS tablet Take 1 tablet (90 mg total) by mouth 2 (two) times daily. 05/09/16  Yes Arbutus Leas, NP  timolol (BETIMOL) 0.5 % ophthalmic solution Place 1 drop into both eyes 2 (two) times daily.   Yes Historical Provider, MD  traMADol (ULTRAM) 50 MG tablet Take 1-2 tablets (50-100 mg total) by mouth 2 (two) times daily. Take 2 tablets every morning and take 1 tablet every evening 04/16/11  Yes Hester Mates, MD  nitroGLYCERIN (NITROSTAT) 0.4 MG SL tablet Place 1 tablet (0.4 mg total) under the tongue every 5 (five) minutes as needed. For chest pain 09/14/12   Minus Breeding, MD    Scheduled Meds: . aspirin EC  81 mg Oral Daily  . brimonidine  1 drop Right Eye BID  . dorzolamide  1 drop Both Eyes TID  . heparin  5,000 Units Subcutaneous Q8H  . latanoprost  1 drop Both Eyes QHS  . metoprolol succinate  50 mg Oral Daily  . pantoprazole  40 mg Oral Daily  . ramipril  10 mg Oral Daily  . ticagrelor  90 mg Oral BID  . timolol  1 drop Both Eyes BID   Continuous Infusions: PRN Meds:.gi cocktail, morphine injection, nitroGLYCERIN, ondansetron (ZOFRAN) IV, traMADol, zolpidem  Allergies as of 06/22/2016 - Review Complete 06/22/2016  Allergen Reaction Noted  . Diphenhydramine hcl Other (See Comments)   . Penicillins Other (See Comments) 07/12/2008  . Shellfish allergy Other (See Comments) 08/25/2011    Family History  Problem Relation Age of Onset  . Stroke Mother   .  Stroke Father     Social History   Social History  . Marital status: Widowed    Spouse name: N/A  . Number of children: N/A  . Years of education: N/A   Occupational History  . Not on file.   Social History Main Topics  . Smoking status: Never Smoker  . Smokeless tobacco: Never Used  . Alcohol use No  . Drug use: No  . Sexual activity: No   Other Topics Concern  . Not on file   Social History Narrative   Lives with daughter and granddaughter    PCP: Dr. Reinaldo Meeker in [place]          Review of Systems: All negative except as stated above in HPI. Review of Systems  Constitutional: Negative for chills and fever.  HENT: Negative for ear discharge, ear pain and nosebleeds.   Eyes: Negative for photophobia, pain and discharge.  Respiratory: Negative for cough and hemoptysis.   Cardiovascular: Negative for chest pain and palpitations.  Gastrointestinal: Negative for abdominal pain, blood in stool, heartburn, melena, nausea and vomiting.  Genitourinary: Negative for frequency and hematuria.  Musculoskeletal: Positive for myalgias. Negative for back pain.  Skin: Negative for itching and rash.  Neurological: Negative for focal weakness and seizures.  Endo/Heme/Allergies: Does not bruise/bleed easily.  Psychiatric/Behavioral: Negative for hallucinations and substance abuse.    Physical Exam: Vital signs: Vitals:   06/23/16 2257 06/24/16 0547  BP: 132/73 116/62  Pulse: 64 61  Resp:    Temp: 98.6 F (37 C) 98.1 F (36.7 C)   Last BM Date: 06/20/16 General:   Alert,  Well-developed, well-nourished, pleasant and cooperative in NAD HEENT : normocephalic, atraumatic. EOMI  Sclerae. No icterus noted. Lungs:  Clear throughout to auscultation.   No wheezes, crackles, or rhonchi. No acute distress. Heart:  Regular rate and rhythm; no murmurs, clicks, rubs,  or gallops. Abdomen: soft, nontender, nondistended, bowel sounds present. Not able to palpate hepatosplenomegaly.  No peritoneal signs. LE: no edema or cyanosis Psych : mood and affect normal. Alert oriented 3. Rectal:  Deferred  GI:  Lab Results:  Recent Labs  06/22/16 1058 06/23/16 0542  WBC 9.3 7.8  HGB 10.8* 10.1*  HCT 33.9* 32.2*  PLT 220 220   BMET  Recent Labs  06/22/16 1058 06/22/16 1600 06/24/16 0539  NA 139  --  139  K 4.4  --  3.8  CL 110  --  112*  CO2 21*  --  21*  GLUCOSE 91  --  101*  BUN 18  --  18  CREATININE 1.03* 0.99 1.02*  CALCIUM 11.8*  --  11.3*   LFT  Recent Labs  06/22/16 1058 06/24/16 0539  PROT 7.6 6.9  ALBUMIN 3.6 3.2*  AST 246* 249*  ALT 208* 212*  ALKPHOS 374* 360*  BILITOT 0.7 0.6  BILIDIR 0.2  --   IBILI 0.5  --    PT/INR No results for input(s): LABPROT, INR in the last 72 hours.   Studies/Results: US Abdomen Complete  Result Date: 06/22/2016 CLINICAL DATA:  Epigastric pain since 10 a.m. this morning. EXAM: ABDOMEN ULTRASOUND COMPLETE COMPARISON:  Abdomen and pelvis CT 03/11/2014 FINDINGS: Gallbladder: No gallstones or gallbladder wall thickening. No pericholecystic fluid. The sonographer reports no sonographic Murphy's sign. Common bile duct: Diameter: 4 mm Liver: No focal lesion identified. Within normal limits in parenchymal echogenicity. IVC: No abnormality visualized. Pancreas: Obscured by bowel gas Spleen: Size and appearance within normal limits. Right Kidney: Length: 8.1 cm. Cortex looks echogenic compared to the reference liver parenchyma. No hydronephrosis. Left Kidney: Length: 9.2 cm. 3.0 cm simple cyst identified lower pole. Simple cyst was seen in the lower pole left kidney on previous CT over 2 years ago, measuring 2.5 cm at that time. Abdominal aorta: No aneurysm visualized. Other findings: None. IMPRESSION: No acute findings in the abdomen. Specifically, no sonographic evidence to explain the patient's history of pain. Electronically Signed   By: Misty Stanley M.D.   On: 06/22/2016 14:28   Ct Abdomen Pelvis W  Contrast  Result Date: 06/23/2016 CLINICAL DATA:  Abnormal liver function tests. EXAM: CT ABDOMEN AND PELVIS WITH CONTRAST TECHNIQUE: Multidetector CT imaging of the abdomen and pelvis was performed using the standard protocol following bolus administration of intravenous contrast. CONTRAST:  66mL ISOVUE-300  IOPAMIDOL (ISOVUE-300) INJECTION 61% COMPARISON:  CT of the abdomen and pelvis with contrast 03/11/2014 FINDINGS: Lower chest: The lung bases are clear. The heart size normal. No significant pleural or pericardial effusion is present. Hepatobiliary: There is mild diffuse fatty infiltration of liver. No discrete hepatic lesions are present. Mild prominence of the common bile duct is stable. The gallbladder is normal. Pancreas: Ill-defined low-density mass is present at the head of the pancreas measuring 11 x 18 mm. This is increased in size since prior exam. The more distal pancreas is unremarkable. No significant ductal dilation is present. Spleen: Negative Adrenals/Urinary Tract: Chronic fullness of the adrenal glands is present bilaterally without discrete lesions. A 3.1 cm exophytic cyst is present at the lower pole of the left kidney. The kidneys and ureters are otherwise unremarkable. The urinary bladder is within normal limits. Stomach/Bowel: Stomach and duodenum are within normal limits. The small bowel is unremarkable. The appendix is visualized and normal. The ascending and transverse colon are within normal limits. The descending colon is unremarkable. The sigmoid colon is within normal limits. Vascular/Lymphatic: Aortic atherosclerosis. No enlarged abdominal or pelvic lymph nodes. Reproductive: Uterus and bilateral adnexa are unremarkable. Other: No significant free fluid or free air is present. No abdominal wall hernia is noted. Musculoskeletal: Levoconvex curvature of the lumbar spine is centered at L2 without significant interval change. There marked endplate changes. Degenerative changes are  noted in the SI joints. Pelvis is otherwise intact. The hips are located. IMPRESSION: 1. Chronic dilated common bile duct with ill-defined mass lesion at the head of the pancreas. Recommend MRI of the pancreas with and without contrast for GI consult for further evaluation. 2. No discrete hepatic lesions. 3. Extensive atherosclerotic change. 4. Severe scoliosis with chronic marked degenerative change. Electronically Signed   By: San Morelle M.D.   On: 06/23/2016 21:05    Impression/Plan: - abnormal CT scan showing ill-defined mass lesion in the head of the pancreas. - Abnormal LFTs. Normal total bilirubin. Elevated AST/AST and alkaline phosphatase. - Recent STEMI  Jan 2018. Status post PCI. Currently on  brilinta  and aspirin. - unstable angina.  Recommendations ------------------------ - We'll order an MRI/MRCP for further evaluation.  - hepatitis panel and LFTs. - further  management based on MRI finding. - GI will follow.    LOS: 0 days   Otis Brace  MD, FACP 06/24/2016, 12:02 PM  Pager [phone] If no answer or after 5 PM call [phone]

## 2016-06-24 NOTE — Progress Notes (Signed)
[facility] PROGRESS NOTE  [name] NWG:[id number] DOB: 04/28/1925 DOA: 06/22/2016   PCP: Lillard Anes, MD     Assessment/Plan: Principal Problem:   Chest pain Active Problems:   HYPERLIPIDEMIA TYPE IIB / III   HYPERTENSION, BENIGN   CAD, NATIVE VESSEL   Abdominal pain   Depression   Elevated LFTs   Precordial pain   83 y.o. female with medical history significant of recent MI treated with cardiac cath andgioplasty in January 2018, HTN, hyperlipidemia with previous statin intolerance, hypothyroidism, hyperparathyroidism, depression, degenerative disc disease who presented to the emergency department with complaints of chest pain.Chest x-ray didn't show any active cardiopulmonary disease.EKG showed normal sinus rhythm without any new ischemic changes. Workup revealed a new pancreatic mass.  Assessment and plan  Unstable angina/CAD Cardiac enzymes flat, 3. Telemetry uneventful Continue beta blocker therapy and monitor for recurrent symptoms Continue aspirin, Brilinta, metoprolol, Altace Recent cardiac cath 05/06/16 Successful PCI and drug-eluting stent of the distal dominant RCA into the PDA for "inferior STEMI  The patient did have 60% distal left main disease as well as high grade segmental mid LAD which given her age will be treated medically.  Continue PPI given dual antiplatelet therapy No cardiac intervention planned this admission. Follow up with Dr. Percival Spanish 3/30 as schedule.    Abdominal pain with transaminitis  /pancreatic mass Liver enzymes elevated: Numbers have increased, alkaline phosphatase now 374, AST 246, ALT 208. Bilirubin 0.7 Abdominal ultrasound didn't reveal any acute findings in the abdomen Last time liver enzymes in January 2018 were normal We will hold statin therapy for now Check hepatitis panel,  CT of abdomen showed chronic dilated common bile duct with ill-defined mass lesion at the head of the pancreas. [facility] has been consulted,MRI/MRCP for further evaluation   Hypertension Continue home medications and adjust the doses if needed  Hyperlipidemia Patient was treated with statins before and the therapy was discontinued due to myalgias and elevated liver enzymes in the past After PCI in January 2018 patient is started statin therapy, which now would need to be discontinued d/t  elevated LFTs Continue low-cholesterol low-fat diet    DVT prophylaxsis heparin  Code Status:  Full code     Family Communication: Discussed in detail with the patient, all imaging results, lab results explained to the patient   Disposition Plan: Gastroenterology consultation      Consultants:  cardiology  GI  Procedures:  None  Antibiotics: Anti-infectives    None         HPI/Subjective: No nausea, no vomitting, no RUQ pain  Objective: Vitals:   06/23/16 1410 06/23/16 2257 06/24/16 0400 06/24/16 0547  BP: 108/61 132/73  116/62  Pulse: 63 64  61  Resp: 18     Temp: 98.3 F (36.8 C) 98.6 F (37 C)  98.1 F (36.7 C)  TempSrc: Oral Oral  Oral  SpO2: 98% 98%  100%  Weight:   44.2 kg (97 lb 8 oz)   Height:        Intake/Output Summary (Last 24 hours) at 06/24/16 0832 Last data filed at 06/24/16 0400  Gross per 24 hour  Intake              600 ml  Output              475 ml  Net              125 ml    Exam:  Examination:  General exam: Appears calm and comfortable  Respiratory system: Clear to auscultation. Respiratory effort normal. Cardiovascular system: S1 & S2 heard, RRR. No JVD, murmurs, rubs, gallops or clicks. No pedal edema. Gastrointestinal system: Abdomen is nondistended, soft and nontender. No organomegaly or masses felt. Normal bowel sounds heard. Central nervous system: Alert and oriented. No focal neurological deficits. Extremities: Symmetric 5 x 5 power. Skin: No rashes, lesions or ulcers Psychiatry: Judgement and insight appear normal. Mood  & affect appropriate.     Data Reviewed: I have personally reviewed following labs and imaging studies  Micro Results No results found for this or any previous visit (from the past 240 hour(s)).  Radiology Reports Dg Chest 2 View  Result Date: 06/22/2016 CLINICAL DATA:  Chest pain EXAM: CHEST  2 VIEW COMPARISON:  05/07/2016 chest radiograph. FINDINGS: Stable cardiomediastinal silhouette with normal heart size and aortic atherosclerosis. No pneumothorax. No pleural effusion. Lungs appear clear, with no acute consolidative airspace disease and no pulmonary edema. Coronary stent overlies the heart. IMPRESSION: No active cardiopulmonary disease. Aortic atherosclerosis. Electronically Signed   By: Ilona Sorrel M.D.   On: 06/22/2016 11:33   US Abdomen Complete  Result Date: 06/22/2016 CLINICAL DATA:  Epigastric pain since 10 a.m. this morning. EXAM: ABDOMEN ULTRASOUND COMPLETE COMPARISON:  Abdomen and pelvis CT 03/11/2014 FINDINGS: Gallbladder: No gallstones or gallbladder wall thickening. No pericholecystic fluid. The sonographer reports no sonographic Murphy's sign. Common bile duct: Diameter: 4 mm Liver: No focal lesion identified. Within normal limits in parenchymal echogenicity. IVC: No abnormality visualized. Pancreas: Obscured by bowel gas Spleen: Size and appearance within normal limits. Right Kidney: Length: 8.1 cm. Cortex looks echogenic compared to the reference liver parenchyma. No hydronephrosis. Left Kidney: Length: 9.2 cm. 3.0 cm simple cyst identified lower pole. Simple cyst was seen in the lower pole left kidney on previous CT over 2 years ago, measuring 2.5 cm at that time. Abdominal aorta: No aneurysm visualized. Other findings: None. IMPRESSION: No acute findings in the abdomen. Specifically, no sonographic evidence to explain the patient's history of pain. Electronically Signed   By: Misty Stanley M.D.   On: 06/22/2016 14:28   Ct Abdomen Pelvis W Contrast  Result Date:  06/23/2016 CLINICAL DATA:  Abnormal liver function tests. EXAM: CT ABDOMEN AND PELVIS WITH CONTRAST TECHNIQUE: Multidetector CT imaging of the abdomen and pelvis was performed using the standard protocol following bolus administration of intravenous contrast. CONTRAST:  81mL ISOVUE-300 IOPAMIDOL (ISOVUE-300) INJECTION 61% COMPARISON:  CT of the abdomen and pelvis with contrast 03/11/2014 FINDINGS: Lower chest: The lung bases are clear. The heart size normal. No significant pleural or pericardial effusion is present. Hepatobiliary: There is mild diffuse fatty infiltration of liver. No discrete hepatic lesions are present. Mild prominence of the common bile duct is stable. The gallbladder is normal. Pancreas: Ill-defined low-density mass is present at the head of the pancreas measuring 11 x 18 mm. This is increased in size since prior exam. The more distal pancreas is unremarkable. No significant ductal dilation is present. Spleen: Negative Adrenals/Urinary Tract: Chronic fullness of the adrenal glands is present bilaterally without discrete lesions. A 3.1 cm exophytic cyst is present at the lower pole of the left kidney. The kidneys and ureters are otherwise unremarkable. The urinary bladder is within normal limits. Stomach/Bowel: Stomach and duodenum are within normal limits. The small bowel is unremarkable. The appendix is visualized and normal. The ascending and transverse colon are within normal limits. The descending colon is unremarkable. The sigmoid  colon is within normal limits. Vascular/Lymphatic: Aortic atherosclerosis. No enlarged abdominal or pelvic lymph nodes. Reproductive: Uterus and bilateral adnexa are unremarkable. Other: No significant free fluid or free air is present. No abdominal wall hernia is noted. Musculoskeletal: Levoconvex curvature of the lumbar spine is centered at L2 without significant interval change. There marked endplate changes. Degenerative changes are noted in the SI joints.  Pelvis is otherwise intact. The hips are located. IMPRESSION: 1. Chronic dilated common bile duct with ill-defined mass lesion at the head of the pancreas. Recommend MRI of the pancreas with and without contrast for GI consult for further evaluation. 2. No discrete hepatic lesions. 3. Extensive atherosclerotic change. 4. Severe scoliosis with chronic marked degenerative change. Electronically Signed   By: San Morelle M.D.   On: 06/23/2016 21:05     CBC  Recent Labs Lab 06/22/16 1058 06/23/16 0542  WBC 9.3 7.8  HGB 10.8* 10.1*  HCT 33.9* 32.2*  PLT 220 220  MCV 98.3 96.7  MCH 31.3 30.3  MCHC 31.9 31.4  RDW 13.8 13.5    Chemistries   Recent Labs Lab 06/22/16 1058 06/22/16 1600 06/24/16 0539  NA 139  --  139  K 4.4  --  3.8  CL 110  --  112*  CO2 21*  --  21*  GLUCOSE 91  --  101*  BUN 18  --  18  CREATININE 1.03* 0.99 1.02*  CALCIUM 11.8*  --  11.3*  AST 246*  --  249*  ALT 208*  --  212*  ALKPHOS 374*  --  360*  BILITOT 0.7  --  0.6   ------------------------------------------------------------------------------------------------------------------ estimated creatinine clearance is 26.6 mL/min (by C-G formula based on SCr of 1.02 mg/dL (H)). ------------------------------------------------------------------------------------------------------------------ No results for input(s): HGBA1C in the last 72 hours. ------------------------------------------------------------------------------------------------------------------ No results for input(s): CHOL, HDL, LDLCALC, TRIG, CHOLHDL, LDLDIRECT in the last 72 hours. ------------------------------------------------------------------------------------------------------------------ No results for input(s): TSH, T4TOTAL, T3FREE, THYROIDAB in the last 72 hours.  Invalid input(s): FREET3 ------------------------------------------------------------------------------------------------------------------ No results for  input(s): VITAMINB12, FOLATE, FERRITIN, TIBC, IRON, RETICCTPCT in the last 72 hours.  Coagulation profile No results for input(s): INR, PROTIME in the last 168 hours.  No results for input(s): DDIMER in the last 72 hours.  Cardiac Enzymes  Recent Labs Lab 06/22/16 1600 06/22/16 1812 06/22/16 2131  TROPONINI 0.07* 0.06* 0.06*   ------------------------------------------------------------------------------------------------------------------ Invalid input(s): POCBNP   CBG: No results for input(s): GLUCAP in the last 168 hours.     Studies: Dg Chest 2 View  Result Date: 06/22/2016 CLINICAL DATA:  Chest pain EXAM: CHEST  2 VIEW COMPARISON:  05/07/2016 chest radiograph. FINDINGS: Stable cardiomediastinal silhouette with normal heart size and aortic atherosclerosis. No pneumothorax. No pleural effusion. Lungs appear clear, with no acute consolidative airspace disease and no pulmonary edema. Coronary stent overlies the heart. IMPRESSION: No active cardiopulmonary disease. Aortic atherosclerosis. Electronically Signed   By: Ilona Sorrel M.D.   On: 06/22/2016 11:33   US Abdomen Complete  Result Date: 06/22/2016 CLINICAL DATA:  Epigastric pain since 10 a.m. this morning. EXAM: ABDOMEN ULTRASOUND COMPLETE COMPARISON:  Abdomen and pelvis CT 03/11/2014 FINDINGS: Gallbladder: No gallstones or gallbladder wall thickening. No pericholecystic fluid. The sonographer reports no sonographic Murphy's sign. Common bile duct: Diameter: 4 mm Liver: No focal lesion identified. Within normal limits in parenchymal echogenicity. IVC: No abnormality visualized. Pancreas: Obscured by bowel gas Spleen: Size and appearance within normal limits. Right Kidney: Length: 8.1 cm. Cortex looks echogenic compared to the reference liver parenchyma.  No hydronephrosis. Left Kidney: Length: 9.2 cm. 3.0 cm simple cyst identified lower pole. Simple cyst was seen in the lower pole left kidney on previous CT over 2 years ago,  measuring 2.5 cm at that time. Abdominal aorta: No aneurysm visualized. Other findings: None. IMPRESSION: No acute findings in the abdomen. Specifically, no sonographic evidence to explain the patient's history of pain. Electronically Signed   By: Misty Stanley M.D.   On: 06/22/2016 14:28   Ct Abdomen Pelvis W Contrast  Result Date: 06/23/2016 CLINICAL DATA:  Abnormal liver function tests. EXAM: CT ABDOMEN AND PELVIS WITH CONTRAST TECHNIQUE: Multidetector CT imaging of the abdomen and pelvis was performed using the standard protocol following bolus administration of intravenous contrast. CONTRAST:  70mL ISOVUE-300 IOPAMIDOL (ISOVUE-300) INJECTION 61% COMPARISON:  CT of the abdomen and pelvis with contrast 03/11/2014 FINDINGS: Lower chest: The lung bases are clear. The heart size normal. No significant pleural or pericardial effusion is present. Hepatobiliary: There is mild diffuse fatty infiltration of liver. No discrete hepatic lesions are present. Mild prominence of the common bile duct is stable. The gallbladder is normal. Pancreas: Ill-defined low-density mass is present at the head of the pancreas measuring 11 x 18 mm. This is increased in size since prior exam. The more distal pancreas is unremarkable. No significant ductal dilation is present. Spleen: Negative Adrenals/Urinary Tract: Chronic fullness of the adrenal glands is present bilaterally without discrete lesions. A 3.1 cm exophytic cyst is present at the lower pole of the left kidney. The kidneys and ureters are otherwise unremarkable. The urinary bladder is within normal limits. Stomach/Bowel: Stomach and duodenum are within normal limits. The small bowel is unremarkable. The appendix is visualized and normal. The ascending and transverse colon are within normal limits. The descending colon is unremarkable. The sigmoid colon is within normal limits. Vascular/Lymphatic: Aortic atherosclerosis. No enlarged abdominal or pelvic lymph nodes.  Reproductive: Uterus and bilateral adnexa are unremarkable. Other: No significant free fluid or free air is present. No abdominal wall hernia is noted. Musculoskeletal: Levoconvex curvature of the lumbar spine is centered at L2 without significant interval change. There marked endplate changes. Degenerative changes are noted in the SI joints. Pelvis is otherwise intact. The hips are located. IMPRESSION: 1. Chronic dilated common bile duct with ill-defined mass lesion at the head of the pancreas. Recommend MRI of the pancreas with and without contrast for GI consult for further evaluation. 2. No discrete hepatic lesions. 3. Extensive atherosclerotic change. 4. Severe scoliosis with chronic marked degenerative change. Electronically Signed   By: San Morelle M.D.   On: 06/23/2016 21:05      Lab Results  Component Value Date   HGBA1C 5.9 (H) 08/26/2011   Lab Results  Component Value Date   LDLCALC 177 (H) 05/06/2016   CREATININE 1.02 (H) 06/24/2016       Scheduled Meds: . aspirin EC  81 mg Oral Daily  . brimonidine  1 drop Right Eye BID  . dorzolamide  1 drop Both Eyes TID  . heparin  5,000 Units Subcutaneous Q8H  . latanoprost  1 drop Both Eyes QHS  . metoprolol succinate  50 mg Oral Daily  . pantoprazole  40 mg Oral Daily  . ramipril  10 mg Oral Daily  . ticagrelor  90 mg Oral BID  . timolol  1 drop Both Eyes BID   Continuous Infusions:   LOS: 0 days    Time spent: >30 MINS    [facility]  [facility] Pager [phone].  If 7PM-7AM, please contact night-coverage at [email or web address], password [facility] 06/24/2016, 8:32 AM  LOS: 0 days

## 2016-06-25 ENCOUNTER — Inpatient Hospital Stay (HOSPITAL_COMMUNITY): Payer: Medicare Other

## 2016-06-25 ENCOUNTER — Encounter (HOSPITAL_COMMUNITY): Payer: Self-pay | Admitting: *Deleted

## 2016-06-25 DIAGNOSIS — R0789 Other chest pain: Secondary | ICD-10-CM

## 2016-06-25 DIAGNOSIS — K8689 Other specified diseases of pancreas: Secondary | ICD-10-CM

## 2016-06-25 DIAGNOSIS — K869 Disease of pancreas, unspecified: Secondary | ICD-10-CM

## 2016-06-25 DIAGNOSIS — I2510 Atherosclerotic heart disease of native coronary artery without angina pectoris: Secondary | ICD-10-CM

## 2016-06-25 LAB — CBC
HEMATOCRIT: 32.7 % — AB (ref 36.0–46.0)
Hemoglobin: 10.2 g/dL — ABNORMAL LOW (ref 12.0–15.0)
MCH: 30.3 pg (ref 26.0–34.0)
MCHC: 31.2 g/dL (ref 30.0–36.0)
MCV: 97 fL (ref 78.0–100.0)
Platelets: 227 10*3/uL (ref 150–400)
RBC: 3.37 MIL/uL — AB (ref 3.87–5.11)
RDW: 13.6 % (ref 11.5–15.5)
WBC: 8.7 10*3/uL (ref 4.0–10.5)

## 2016-06-25 LAB — COMPREHENSIVE METABOLIC PANEL
ALT: 212 U/L — ABNORMAL HIGH (ref 14–54)
AST: 255 U/L — AB (ref 15–41)
Albumin: 3.2 g/dL — ABNORMAL LOW (ref 3.5–5.0)
Alkaline Phosphatase: 326 U/L — ABNORMAL HIGH (ref 38–126)
Anion gap: 8 (ref 5–15)
BUN: 19 mg/dL (ref 6–20)
CHLORIDE: 114 mmol/L — AB (ref 101–111)
CO2: 21 mmol/L — AB (ref 22–32)
Calcium: 11.6 mg/dL — ABNORMAL HIGH (ref 8.9–10.3)
Creatinine, Ser: 1.04 mg/dL — ABNORMAL HIGH (ref 0.44–1.00)
GFR calc Af Amer: 54 mL/min — ABNORMAL LOW (ref >60)
GFR calc non Af Amer: 47 mL/min — ABNORMAL LOW (ref >60)
Glucose, Bld: 103 mg/dL — ABNORMAL HIGH (ref 65–99)
POTASSIUM: 3.8 mmol/L (ref 3.5–5.1)
Sodium: 143 mmol/L (ref 135–145)
Total Bilirubin: 0.4 mg/dL (ref 0.3–1.2)
Total Protein: 6.7 g/dL (ref 6.5–8.1)

## 2016-06-25 LAB — IRON AND TIBC
IRON: 58 ug/dL (ref 28–170)
Saturation Ratios: 17 % (ref 10.4–31.8)
TIBC: 339 ug/dL (ref 250–450)
UIBC: 281 ug/dL

## 2016-06-25 LAB — PROTIME-INR
INR: 1.09
Prothrombin Time: 14.2 seconds (ref 11.4–15.2)

## 2016-06-25 LAB — FERRITIN: FERRITIN: 85 ng/mL (ref 11–307)

## 2016-06-25 MED ORDER — PANTOPRAZOLE SODIUM 40 MG PO TBEC: 40.0000 mg | DELAYED_RELEASE_TABLET | Freq: Every day | ORAL | 0 refills | Status: DC

## 2016-06-25 MED ORDER — GADOBENATE DIMEGLUMINE 529 MG/ML IV SOLN
10.0000 mL | Freq: Once | INTRAVENOUS | Status: AC | PRN
  Administered 2016-06-25: 9 mL via INTRAVENOUS

## 2016-06-25 MED ORDER — NITROGLYCERIN 0.4 MG SL SUBL: 0.4000 mg | SUBLINGUAL_TABLET | SUBLINGUAL | 0 refills | Status: DC | PRN

## 2016-06-25 NOTE — Discharge Summary (Signed)
Physician Discharge Summary  [name] YKZ:[id number] DOB: 1925/06/10 DOA: 06/22/2016  PCP: Lillard Anes, MD  Admit date: 06/22/2016 Discharge date: 06/25/2016  Admitted From: Home Disposition:  Home  Recommendations for Outpatient Follow-up:  1. Follow up with PCP in 1 week 2. Follow up with GI in 2 weeks  3. Follow up with Cardiology as previously scheduled on 3/30  4. Please obtain CMP in 1-2 weeks  5. Please follow up on the following pending results: iron, ferritin, antismooth muscle antibodies, mitochondrial antibodies, ceruloplasmin  6. Recommend repeat MRI in 2 years for follow-up for pancreatic cyst  Home Health: No  Equipment/Devices: None   Discharge Condition: Stable CODE STATUS: Full  Diet recommendation: Heart Healthy   Brief/Interim Summary: [name] is a 83 y.o.femalewith medical history significant of recent MI treated with cardiac cath andgioplasty in January 2018, HTN, hyperlipidemia with previous statin intolerance, hypothyroidism, hyperparathyroidism,depression, degenerative disc disease who presented to the emergency department with complaints of chest pain. Chest x-ray didn't show any active cardiopulmonary disease. EKG showed normal sinus rhythm without any new ischemic changes. Workup revealed a new pancreatic mass. Cardiology was consulted for unstable angina, her troponin levels were flat, consistent with demand ischemia. EKG without acute changes. There was no cardiac intervention this admission, patient will follow-up with Dr. Percival Spanish at the end of the month. GI was consulted for new findings of a pancreatic mass. Patient underwent MRI/MRCP for further evaluation, which revealed multiple small pancreatic cyst as well as one dominant cyst without pancreatic duct dilatation. They recommend repeat MRI in 2 years for follow-up as well as follow-up with GI in 2 weeks.  Subjective on day of discharge: Feeling well this morning. She has no  complaints of chest pain, shortness of breath, or abdominal pain. She denies any nausea or vomiting.  Discharge Diagnoses:  Principal Problem:   Chest pain Active Problems:   HYPERLIPIDEMIA TYPE IIB / III   HYPERTENSION, BENIGN   CAD, NATIVE VESSEL   Abdominal pain   Depression   Abnormal liver function test   Precordial pain   Pancreatic mass   Chest pain, unstable angina, CAD  -Cardiac enzymes flat, 3. EKG unremarkable for acute change -Recent cardiac cath 05/06/16 Successful PCI and drug-eluting stent of the distal dominant RCA into the PDA for inferior STEMI  The patient did have 60% distal left main disease as well as high grade segmental mid LAD which given her age will be treated medically -Continue beta blocker therapy, aspirin, Brilinta, toprol, Altace  -Continue PPI given dual antiplatelet therapy -Cardiology consulted  -No cardiac intervention planned this admission. Follow up with Dr. Percival Spanish 3/30 as scheduled  Abdominal pain with pancreatic cyst  -Liver enzymes elevated and will need follow up as outpatient  -CT of abdomen showed chronic dilated common bile duct with ill-defined mass lesion at the head of the pancreas. [facility] gastroenterology has been consulted, MRI/MRCP for further evaluation. MRI showed multiple small pancreatic cyst along with one dominant cyst measuring 1.5 cm in pancreatic head. No biliary ductal dilatation. No pancreatic duct dilatation. Recommended repeat MRI in 2 years for follow-up. -Hepatitis panel negative. ANA/ASMA/AMA/Iron studies/Ceruloplasmin ordered and pending at discharge  -Follow-up in GI clinic in 2  weeks.  Essential HTN -Continue toprol  -Stable   HLD -Holding statin due to elevated LFT    Discharge Instructions  Discharge Instructions    Diet - low sodium heart healthy    Complete by:  As directed    Increase activity  slowly    Complete by:  As directed      Allergies as of 06/25/2016      Reactions    Diphenhydramine Hcl Other (See Comments)   unknown   Penicillins Other (See Comments)   unknown   Shellfish Allergy Other (See Comments)   unknown      Medication List    STOP taking these medications   acetaminophen 500 MG tablet Commonly known as:  TYLENOL   atorvastatin 40 MG tablet Commonly known as:  LIPITOR     TAKE these medications   aspirin 81 MG tablet Take 1 tablet (81 mg total) by mouth daily.   bimatoprost 0.03 % ophthalmic solution Commonly known as:  LUMIGAN Apply 1 drop to eye at bedtime.   brimonidine 0.15 % ophthalmic solution Commonly known as:  ALPHAGAN Place 1 drop into the right eye 2 (two) times daily.   dorzolamide 2 % ophthalmic solution Commonly known as:  TRUSOPT Place 1 drop into both eyes 3 (three) times daily.   metoprolol succinate 50 MG 24 hr tablet Commonly known as:  TOPROL-XL TAKE ONE TABLET BY MOUTH EVERY DAY   nitroGLYCERIN 0.4 MG SL tablet Commonly known as:  NITROSTAT Place 1 tablet (0.4 mg total) under the tongue every 5 (five) minutes as needed. For chest pain   pantoprazole 40 MG tablet Commonly known as:  PROTONIX Take 1 tablet (40 mg total) by mouth daily. Start taking on:  06/26/2016   ramipril 10 MG capsule Commonly known as:  ALTACE TAKE ONE CAPSULE BY MOUTH EVERY DAY   ticagrelor 90 MG Tabs tablet Commonly known as:  BRILINTA Take 1 tablet (90 mg total) by mouth 2 (two) times daily.   timolol 0.5 % ophthalmic solution Commonly known as:  BETIMOL Place 1 drop into both eyes 2 (two) times daily.   traMADol 50 MG tablet Commonly known as:  ULTRAM Take 1-2 tablets (50-100 mg total) by mouth 2 (two) times daily. Take 2 tablets every morning and take 1 tablet every evening      Follow-up Information    Minus Breeding, MD. Go on 07/12/2016.   Specialty:  Cardiology Why:  @4 :30pm for post hosptial  Contact information: [address] [place] [address] [phone]        Lillard Anes, MD. Schedule an appointment as soon as possible for a visit in 1 week(s).   Specialty:  Family Medicine Contact information: [address] [place] [address] [phone]        Otis Brace, MD. Schedule an appointment as soon as possible for a visit in 2 week(s).   Specialty:  Gastroenterology Contact information: [place] [address] [phone]          Allergies  Allergen Reactions  . Diphenhydramine Hcl Other (See Comments)    unknown  . Penicillins Other (See Comments)    unknown  . Shellfish Allergy Other (See Comments)    unknown    Consultations:  Cardiology  GI    Procedures/Studies: Dg Chest 2 View  Result Date: 06/22/2016 CLINICAL DATA:  Chest pain EXAM: CHEST  2 VIEW COMPARISON:  05/07/2016 chest radiograph. FINDINGS: Stable cardiomediastinal silhouette with normal heart size and aortic atherosclerosis. No pneumothorax. No pleural effusion. Lungs appear clear, with no acute consolidative airspace disease and no pulmonary edema. Coronary stent overlies the heart. IMPRESSION: No active cardiopulmonary disease. Aortic atherosclerosis. Electronically Signed   By: Janina Mayo.D.  On: 06/22/2016 11:33   US Abdomen Complete  Result Date: 06/22/2016 CLINICAL DATA:  Epigastric pain since 10 a.m. this morning. EXAM: ABDOMEN ULTRASOUND COMPLETE COMPARISON:  Abdomen and pelvis CT 03/11/2014 FINDINGS: Gallbladder: No gallstones or gallbladder wall thickening. No pericholecystic fluid. The sonographer reports no sonographic Murphy's sign. Common bile duct: Diameter: 4 mm Liver: No focal lesion identified. Within normal limits in parenchymal echogenicity. IVC: No abnormality visualized. Pancreas: Obscured by bowel gas Spleen: Size and appearance within normal limits. Right Kidney: Length: 8.1 cm. Cortex looks echogenic compared to the reference liver parenchyma. No hydronephrosis. Left Kidney: Length: 9.2 cm. 3.0 cm simple  cyst identified lower pole. Simple cyst was seen in the lower pole left kidney on previous CT over 2 years ago, measuring 2.5 cm at that time. Abdominal aorta: No aneurysm visualized. Other findings: None. IMPRESSION: No acute findings in the abdomen. Specifically, no sonographic evidence to explain the patient's history of pain. Electronically Signed   By: Misty Stanley M.D.   On: 06/22/2016 14:28   Ct Abdomen Pelvis W Contrast  Result Date: 06/23/2016 CLINICAL DATA:  Abnormal liver function tests. EXAM: CT ABDOMEN AND PELVIS WITH CONTRAST TECHNIQUE: Multidetector CT imaging of the abdomen and pelvis was performed using the standard protocol following bolus administration of intravenous contrast. CONTRAST:  81mL ISOVUE-300 IOPAMIDOL (ISOVUE-300) INJECTION 61% COMPARISON:  CT of the abdomen and pelvis with contrast 03/11/2014 FINDINGS: Lower chest: The lung bases are clear. The heart size normal. No significant pleural or pericardial effusion is present. Hepatobiliary: There is mild diffuse fatty infiltration of liver. No discrete hepatic lesions are present. Mild prominence of the common bile duct is stable. The gallbladder is normal. Pancreas: Ill-defined low-density mass is present at the head of the pancreas measuring 11 x 18 mm. This is increased in size since prior exam. The more distal pancreas is unremarkable. No significant ductal dilation is present. Spleen: Negative Adrenals/Urinary Tract: Chronic fullness of the adrenal glands is present bilaterally without discrete lesions. A 3.1 cm exophytic cyst is present at the lower pole of the left kidney. The kidneys and ureters are otherwise unremarkable. The urinary bladder is within normal limits. Stomach/Bowel: Stomach and duodenum are within normal limits. The small bowel is unremarkable. The appendix is visualized and normal. The ascending and transverse colon are within normal limits. The descending colon is unremarkable. The sigmoid colon is within  normal limits. Vascular/Lymphatic: Aortic atherosclerosis. No enlarged abdominal or pelvic lymph nodes. Reproductive: Uterus and bilateral adnexa are unremarkable. Other: No significant free fluid or free air is present. No abdominal wall hernia is noted. Musculoskeletal: Levoconvex curvature of the lumbar spine is centered at L2 without significant interval change. There marked endplate changes. Degenerative changes are noted in the SI joints. Pelvis is otherwise intact. The hips are located. IMPRESSION: 1. Chronic dilated common bile duct with ill-defined mass lesion at the head of the pancreas. Recommend MRI of the pancreas with and without contrast for GI consult for further evaluation. 2. No discrete hepatic lesions. 3. Extensive atherosclerotic change. 4. Severe scoliosis with chronic marked degenerative change. Electronically Signed   By: San Morelle M.D.   On: 06/23/2016 21:05   Mr 3d Recon At Scanner  Result Date: 06/25/2016 CLINICAL DATA:  Elevated liver function tests. Pancreatic lesion seen on recent CT. EXAM: MRI ABDOMEN WITHOUT AND WITH CONTRAST (INCLUDING MRCP) TECHNIQUE: Multiplanar multisequence MR imaging of the abdomen was performed both before and after the administration of intravenous contrast. Heavily T2-weighted images of the  biliary and pancreatic ducts were obtained, and three-dimensional MRCP images were rendered by post processing. CONTRAST:  19mL MULTIHANCE GADOBENATE DIMEGLUMINE 529 MG/ML IV SOLN COMPARISON:  CT on 06/23/2016 FINDINGS: Lower chest: No acute findings. Hepatobiliary: No masses identified. A few tiny scattered sub-cm cysts are noted. Gallbladder is unremarkable. No evidence of biliary ductal dilatation or choledocholithiasis. Pancreas: Image degradation due to motion artifact . Multiple tiny cystic lesions are seen within the pancreatic head and body, majority measuring less than 1 cm in size. Largest cystic lesion in the pancreatic head measures 1.5 cm on  image 26/5. These lesions show no evidence of contrast enhancement or solid component. No evidence of main pancreatic ductal dilatation or pancreas divisum. No evidence of peripancreatic inflammatory changes. Spleen:  Within normal limits in size and appearance. Adrenals/Urinary Tract: No masses identified. 3 cm simple cyst in lower pole of left kidney. No evidence of hydronephrosis. Stomach/Bowel: Visualized portions within the abdomen are unremarkable. Vascular/Lymphatic: No pathologically enlarged lymph nodes identified. No abdominal aortic aneurysm. Other:  None. Musculoskeletal: No suspicious bone lesions identified. Thoracolumbar spondylosis and levoscoliosis. IMPRESSION: Multiple tiny cystic lesions throughout the pancreas, with dominant lesion in the pancreatic head measuring 1.5 cm. 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 No evidence of main pancreatic ductal dilatation. Recommend continued followup with abdomen MRI without and with contrast in 2 years. This recommendation follows ACR consensus guidelines: Management of Incidental Pancreatic Cysts: A White Paper of the ACR Incidental Findings Committee. [place] 4174;08:144-818. No significant hepatobiliary abnormality identified. Electronically Signed   By: Earle Gell M.D.   On: 06/25/2016 08:15   Mr Abdomen Mrcp Moise Boring Contast  Result Date: 06/25/2016 CLINICAL DATA:  Elevated liver function tests. Pancreatic lesion seen on recent CT. EXAM: MRI ABDOMEN WITHOUT AND WITH CONTRAST (INCLUDING MRCP) TECHNIQUE: Multiplanar multisequence MR imaging of the abdomen was performed both before and after the administration of intravenous contrast. Heavily T2-weighted images of the biliary and pancreatic ducts were obtained, and three-dimensional MRCP images were rendered by post processing. CONTRAST:  46mL MULTIHANCE GADOBENATE DIMEGLUMINE 529 MG/ML IV SOLN COMPARISON:  CT on 06/23/2016 FINDINGS: Lower chest: No acute  findings. Hepatobiliary: No masses identified. A few tiny scattered sub-cm cysts are noted. Gallbladder is unremarkable. No evidence of biliary ductal dilatation or choledocholithiasis. Pancreas: Image degradation due to motion artifact . Multiple tiny cystic lesions are seen within the pancreatic head and body, majority measuring less than 1 cm in size. Largest cystic lesion in the pancreatic head measures 1.5 cm on image 26/5. These lesions show no evidence of contrast enhancement or solid component. No evidence of main pancreatic ductal dilatation or pancreas divisum. No evidence of peripancreatic inflammatory changes. Spleen:  Within normal limits in size and appearance. Adrenals/Urinary Tract: No masses identified. 3 cm simple cyst in lower pole of left kidney. No evidence of hydronephrosis. Stomach/Bowel: Visualized portions within the abdomen are unremarkable. Vascular/Lymphatic: No pathologically enlarged lymph nodes identified. No abdominal aortic aneurysm. Other:  None. Musculoskeletal: No suspicious bone lesions identified. Thoracolumbar spondylosis and levoscoliosis. IMPRESSION: Multiple tiny cystic lesions throughout the pancreas, with dominant lesion in the pancreatic head measuring 1.5 cm. 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 No evidence of main pancreatic ductal dilatation. Recommend continued followup with abdomen MRI without and with contrast in 2 years. This recommendation follows ACR consensus guidelines: Management of Incidental Pancreatic Cysts: A White Paper of the ACR Incidental Findings Committee. [place] 5631;49:[id number]. No  significant hepatobiliary abnormality identified. Electronically Signed   By: Earle Gell M.D.   On: 06/25/2016 08:15    Discharge Exam: Vitals:   06/24/16 1300 06/24/16 2039  BP: (!) 94/51 135/69  Pulse: 66 65  Resp: 16 16  Temp: 98.3 F (36.8 C) 98.4 F (36.9 C)   Vitals:   06/24/16 0400 06/24/16 0547 06/24/16  1300 06/24/16 2039  BP:  116/62 (!) 94/51 135/69  Pulse:  61 66 65  Resp:   16 16  Temp:  98.1 F (36.7 C) 98.3 F (36.8 C) 98.4 F (36.9 C)  TempSrc:  Oral Oral Oral  SpO2:  100% 100% 100%  Weight: 44.2 kg (97 lb 8 oz)     Height:        General: Pt is alert, awake, not in acute distress Cardiovascular: RRR, S1/S2 +, no rubs, no gallops Respiratory: CTA bilaterally, no wheezing, no rhonchi Abdominal: Soft, NT, ND, bowel sounds + Extremities: no edema, no cyanosis    The results of significant diagnostics from this hospitalization (including imaging, microbiology, ancillary and laboratory) are listed below for reference.     Microbiology: No results found for this or any previous visit (from the past 240 hour(s)).   Labs: BNP (last 3 results) No results for input(s): BNP in the last 8760 hours. Basic Metabolic Panel:  Recent Labs Lab 06/22/16 1058 06/22/16 1600 06/24/16 0539 06/25/16 0300  NA 139  --  139 143  K 4.4  --  3.8 3.8  CL 110  --  112* 114*  CO2 21*  --  21* 21*  GLUCOSE 91  --  101* 103*  BUN 18  --  18 19  CREATININE 1.03* 0.99 1.02* 1.04*  CALCIUM 11.8*  --  11.3* 11.6*   Liver Function Tests:  Recent Labs Lab 06/22/16 1058 06/24/16 0539 06/25/16 0300  AST 246* 249* 255*  ALT 208* 212* 212*  ALKPHOS 374* 360* 326*  BILITOT 0.7 0.6 0.4  PROT 7.6 6.9 6.7  ALBUMIN 3.6 3.2* 3.2*    Recent Labs Lab 06/22/16 1241  LIPASE <10*   No results for input(s): AMMONIA in the last 168 hours. CBC:  Recent Labs Lab 06/22/16 1058 06/23/16 0542 06/25/16 0300  WBC 9.3 7.8 8.7  HGB 10.8* 10.1* 10.2*  HCT 33.9* 32.2* 32.7*  MCV 98.3 96.7 97.0  PLT 220 220 227   Cardiac Enzymes:  Recent Labs Lab 06/22/16 1600 06/22/16 1812 06/22/16 2131  TROPONINI 0.07* 0.06* 0.06*   BNP: Invalid input(s): POCBNP CBG: No results for input(s): GLUCAP in the last 168 hours. D-Dimer No results for input(s): DDIMER in the last 72 hours. Hgb A1c No  results for input(s): HGBA1C in the last 72 hours. Lipid Profile No results for input(s): CHOL, HDL, LDLCALC, TRIG, CHOLHDL, LDLDIRECT in the last 72 hours. Thyroid function studies No results for input(s): TSH, T4TOTAL, T3FREE, THYROIDAB in the last 72 hours.  Invalid input(s): FREET3 Anemia work up No results for input(s): VITAMINB12, FOLATE, FERRITIN, TIBC, IRON, RETICCTPCT in the last 72 hours. Urinalysis    Component Value Date/Time   COLORURINE YELLOW 03/11/2014 0510   APPEARANCEUR CLEAR 03/11/2014 0510   LABSPEC 1.005 03/11/2014 0510   PHURINE 6.5 03/11/2014 0510   GLUCOSEU NEGATIVE 03/11/2014 0510   HGBUR NEGATIVE 03/11/2014 0510   BILIRUBINUR NEGATIVE 03/11/2014 0510   KETONESUR NEGATIVE 03/11/2014 0510   PROTEINUR NEGATIVE 03/11/2014 0510   UROBILINOGEN 0.2 03/11/2014 0510   NITRITE NEGATIVE 03/11/2014 0510   LEUKOCYTESUR NEGATIVE  03/11/2014 0510   Sepsis Labs Invalid input(s): PROCALCITONIN,  WBC,  LACTICIDVEN Microbiology No results found for this or any previous visit (from the past 240 hour(s)).   Time coordinating discharge: 45 minutes  SIGNED:  Dessa Phi, DO [facility] Pager [phone]  If 7PM-7AM, please contact night-coverage [email or web address] Password [email or web address] 06/25/2016, 1:00 PM

## 2016-06-25 NOTE — Progress Notes (Signed)
[facility] Gastroenterology Progress Note  BAYLYN SICKLES 83 y.o. 1925/04/26  CC:  Abnormal CT scan, elevated LFTs.   Subjective: Patient denied any GI symptoms. Underwent MRI which showed multiple pancreatic cysts. Tolerating diet. Chest pain improving  ROS : Negative for abdominal pain, vomiting or active GI bleed   Objective: Vital signs in last 24 hours: Vitals:   06/24/16 1300 06/24/16 2039  BP: (!) 94/51 135/69  Pulse: 66 65  Resp: 16 16  Temp: 98.3 F (36.8 C) 98.4 F (36.9 C)    Physical Exam:  General:   Alert,  Well-developed, well-nourished, pleasant and cooperative in NAD HEENT : normocephalic, atraumatic. EOMI  Sclerae. No icterus noted. Lungs:  Clear throughout to auscultation.   No wheezes, crackles, or rhonchi. No acute distress. Heart:  Regular rate and rhythm; no murmurs, clicks, rubs,  or gallops. Abdomen: soft, nontender, nondistended, bowel sounds present. Not able to palpate hepatosplenomegaly. No peritoneal signs. LE: no edema or cyanosis Psych : mood and affect normal. Alert oriented 3.    Lab Results:  Recent Labs  06/24/16 0539 06/25/16 0300  NA 139 143  K 3.8 3.8  CL 112* 114*  CO2 21* 21*  GLUCOSE 101* 103*  BUN 18 19  CREATININE 1.02* 1.04*  CALCIUM 11.3* 11.6*    Recent Labs  06/24/16 0539 06/25/16 0300  AST 249* 255*  ALT 212* 212*  ALKPHOS 360* 326*  BILITOT 0.6 0.4  PROT 6.9 6.7  ALBUMIN 3.2* 3.2*    Recent Labs  06/23/16 0542 06/25/16 0300  WBC 7.8 8.7  HGB 10.1* 10.2*  HCT 32.2* 32.7*  MCV 96.7 97.0  PLT 220 227    Recent Labs  06/25/16 0300  LABPROT 14.2  INR 1.09      Assessment/Plan: - abnormal CT scan showing ill-defined mass lesion in the head of the pancreas.MRI-MRCP showed multiple pancreatic cyst - Abnormal LFTs. Normal total bilirubin. Elevated AST/AST and alkaline phosphatase - stable. Negative hepatitis panel - Recent STEMI  Jan 2018. Status post PCI. Currently on  brilinta  and  aspirin. - unstable angina. - Chronic Anemia. No overt bleeding.  Recommendations ------------------------ - MRI showed multiple small pancreatic cyst along with one dominant cyst measuring 1.5 cm in pancreatic head. No biliary ductal dilatation. No pancreatic duct dilatation. Recommended repeat MRI in 2 years for follow-up. - Hepatitis panel negative. We will get second markers for liver disease. ANA/ASMA/AMA/Iron studies/Ceruloplasmin  - Patient with no acute GI issues. LFTs are stable. Normal INR. Normal platelet. - Recommend Outpatient workup for abnormal LFTs.  - GI will sign off. Follow-up in GI clinic in 2  weeks.   Otis Brace MD, [place] 06/25/2016, 11:01 AM  Pager [phone]  If no answer or after 5 PM call [phone]

## 2016-06-26 LAB — CERULOPLASMIN: Ceruloplasmin: 38 mg/dL (ref 19.0–39.0)

## 2016-06-26 LAB — ANTI-SMOOTH MUSCLE ANTIBODY, IGG: F-ACTIN AB IGG: 16 U (ref 0–19)

## 2016-06-26 LAB — MITOCHONDRIAL ANTIBODIES: Mitochondrial M2 Ab, IgG: 8.2 Units (ref 0.0–20.0)

## 2016-07-11 DIAGNOSIS — K862 Cyst of pancreas: Secondary | ICD-10-CM | POA: Diagnosis not present

## 2016-07-11 DIAGNOSIS — R945 Abnormal results of liver function studies: Secondary | ICD-10-CM | POA: Diagnosis not present

## 2016-07-11 DIAGNOSIS — D649 Anemia, unspecified: Secondary | ICD-10-CM | POA: Diagnosis not present

## 2016-07-11 NOTE — Progress Notes (Signed)
HPI The patient presents for followup of known coronary disease. This is her first appt since July of 2015.   Since I last saw her she was in the hospital in Jan with NSTEMI.   She had an acute inferior lateral STEMI.   This was felt to be secondary to RCA occlusion. She had stenting of distal RCA into PDA with DES. The remainder of RCA was treated with POBA. She does have residual disease in mid LAD up to 90%. This is a heavily calcified vessel. Per Dr. Martinique  the left main disease is significant.  The LAD disease is acomplex lesion and would require atherectomy. Given her age the decision was to manage medically but if she has recurrent angina it would be reasonable to consider PCI of LAD.  She was started on a statin.  However, she came back to the hospital with chest pain but this time was found to have elevated liver enzymes.  She was taken off statin.  She has since followed yesterday with GI.  She gets around with a cane. She denies any ongoing symptoms.  The patient denies any new symptoms such as chest discomfort, neck or arm discomfort. There has been no new shortness of breath, PND or orthopnea. There have been no reported palpitations, presyncope or syncope.   Allergies  Allergen Reactions  . Diphenhydramine Hcl Other (See Comments)    unknown  . Penicillins Other (See Comments)    unknown  . Shellfish Allergy Other (See Comments)    unknown    Current Outpatient Prescriptions  Medication Sig Dispense Refill  . aspirin 81 MG tablet Take 1 tablet (81 mg total) by mouth daily.    . bimatoprost (LUMIGAN) 0.03 % ophthalmic drops Apply 1 drop to eye at bedtime.     . brimonidine (ALPHAGAN) 0.15 % ophthalmic solution Place 1 drop into the right eye 2 (two) times daily. 5 mL 0  . dorzolamide (TRUSOPT) 2 % ophthalmic solution Place 1 drop into both eyes 3 (three) times daily.    . metoprolol succinate (TOPROL-XL) 50 MG 24 hr tablet TAKE ONE TABLET BY MOUTH EVERY DAY 30 tablet 6  .  nitroGLYCERIN (NITROSTAT) 0.4 MG SL tablet Place 1 tablet (0.4 mg total) under the tongue every 5 (five) minutes as needed. For chest pain 25 tablet 2  . pantoprazole (PROTONIX) 40 MG tablet Take 1 tablet (40 mg total) by mouth daily. 30 tablet 0  . ramipril (ALTACE) 10 MG capsule TAKE ONE CAPSULE BY MOUTH EVERY DAY 30 capsule 6  . ticagrelor (BRILINTA) 90 MG TABS tablet Take 1 tablet (90 mg total) by mouth 2 (two) times daily. 60 tablet 12  . timolol (BETIMOL) 0.5 % ophthalmic solution Place 1 drop into both eyes 2 (two) times daily.    . traMADol (ULTRAM) 50 MG tablet Take 1-2 tablets (50-100 mg total) by mouth 2 (two) times daily. Take 2 tablets every morning and take 1 tablet every evening 30 tablet 0   No current facility-administered medications for this visit.     Past Medical History:  Diagnosis Date  . Abscess 2003   I&D SCM abscess  . Acute ischemic colitis ([place]) 04/10/11   acute episode of ischemic colitis, with abd pain, CT evidence of transverse colitis, and blood streaked BM.  Resolved with conservative treatment.  . Arthritis   . Bacteremia 2003   hx of staph bacteremia  . CAD (coronary artery disease) 2003, 2010   s/p stenting of RCA  x2, s/p stent to OM;  LHC 04/2009 in the setting of a NSTEMI: EF 65%, RCA stent patent with 60% ISR proximal and 40% ISR mid, PLV proximal 50-60% and distal 50%, ostial circumflex 30-40%, proximal OM mild in-stent restenosis with a branch with 90+ percent ostial stenosis, proximal LAD 30-40%.  FFR of ostial circumflex:  0.96 - not flow limiting.  Medical therapy was continued.  . Degenerative disc disease   . Depression   . History of medication noncompliance    concerning Plavix  . HTN (hypertension)    Echocardiogram 08/26/11: Mild focal basal septal hypertrophy, EF 16-01%, grade 2 diastolic dysfunction, mild MR.  There was a fluid-filled area likely in the location of the liver noted  . Hyperlipidemia   . Hyperparathyroidism 2003    parathyroid adenoma localized to the right inferior thyroid lobe region  . Hypothyroidism   . Lumbar stenosis with neurogenic claudication 2006   s/p decompression L2-L5  . Osteoporosis 2009   diagnosed by bone density scan in 2009, on Vit D only, no bisphosphonate therapy  . Phlebitis    LLE  . PVD (peripheral vascular disease) ([facility])    s/p left fem-pop bypass graft  . Renal artery stenosis ([facility])    left, 40%  . TIA (transient ischemic attack)    carotid dopplers 5/13: no ICA stenosis    Past Surgical History:  Procedure Laterality Date  . APPENDECTOMY    . BACK SURGERY  06/2004   L3, L4 lami; L2, L5 hemilami; decompresion L2-3, 3-4, 4-5  . CARDIAC CATHETERIZATION N/A 05/06/2016   Procedure: Left Heart Cath and Coronary Angiography;  Surgeon: Lorretta Harp, MD;  Location: [facility];  Service: Cardiovascular;  Laterality: N/A;  . CARDIAC CATHETERIZATION N/A 05/06/2016   Procedure: Coronary Stent Intervention;  Surgeon: Lorretta Harp, MD;  Location: [facility];  Service: Cardiovascular;  Laterality: N/A;  RCA  . CORONARY ANGIOPLASTY WITH STENT PLACEMENT  09/2008  . CORONARY ANGIOPLASTY WITH STENT PLACEMENT  04/2001  . FEMORAL BYPASS  before 2003   LLE  . INCISION AND DRAINAGE ANTERIOR NECK  10/2001   absces sternoclavicular joint    ROS:   As stated in the HPI and negative for all other systems.  PHYSICAL EXAM BP (!) 141/72   Pulse 64   Ht 5\' 3"  (1.6 m)   Wt 102 lb (46.3 kg)   BMI 18.07 kg/m  GENERAL:  Well appearing, small HEENT:  Pupils equal round and reactive, fundi not visualized, oral mucosa unremarkable, denutres NECK:  No jugular venous distention, waveform within normal limits, carotid upstroke brisk and symmetric, no bruits, no thyromegaly LUNGS:  Clear to auscultation bilaterally BACK:  No CVA tenderness,miild lordosis. CHEST:  Unremarkable HEART:  PMI not displaced or sustained,S1 and S2 within normal limits, no S3, no S4, no clicks, no  rubs, no murmurs ABD:  Flat, positive bowel sounds normal in frequency in pitch, positive midline bruits, no rebound, no guarding, no midline pulsatile mass, no hepatomegaly, no splenomegaly EXT:  2 plus pulses upper, decreased PT/DP bilateral, no edema, no cyanosis no clubbing    ASSESSMENT AND PLAN  CAD:   I have personally reviewed the films.  I agree with medical management of the residual disease.  The RCA was clearly the culprit lesion.  She is having no symptoms.  No change in therapy is indicated.   HTN:   The blood pressure is at target. No change in medications is indicated. We  will continue with therapeutic lifestyle changes (TLC).  DYSLIPIDEMIA:    She is intolerant of statins.  Her liver enzymes might have been related to a process causing ductal dilatation rather than the statins but she would not take these again.

## 2016-07-12 ENCOUNTER — Encounter: Payer: Self-pay | Admitting: Cardiology

## 2016-07-12 ENCOUNTER — Ambulatory Visit (INDEPENDENT_AMBULATORY_CARE_PROVIDER_SITE_OTHER): Payer: Medicare Other | Admitting: Cardiology

## 2016-07-12 VITALS — BP 141/72 | HR 64 | Ht 63.0 in | Wt 102.0 lb

## 2016-07-12 DIAGNOSIS — I10 Essential (primary) hypertension: Secondary | ICD-10-CM

## 2016-07-12 DIAGNOSIS — I2111 ST elevation (STEMI) myocardial infarction involving right coronary artery: Secondary | ICD-10-CM | POA: Diagnosis not present

## 2016-07-12 DIAGNOSIS — E785 Hyperlipidemia, unspecified: Secondary | ICD-10-CM | POA: Diagnosis not present

## 2016-07-12 DIAGNOSIS — I209 Angina pectoris, unspecified: Secondary | ICD-10-CM | POA: Diagnosis not present

## 2016-07-12 DIAGNOSIS — I25119 Atherosclerotic heart disease of native coronary artery with unspecified angina pectoris: Secondary | ICD-10-CM

## 2016-07-12 MED ORDER — NITROGLYCERIN 0.4 MG SL SUBL: 0.4000 mg | SUBLINGUAL_TABLET | SUBLINGUAL | 2 refills | Status: DC | PRN

## 2016-07-12 NOTE — Patient Instructions (Signed)
Medication Instructions:  Continue current medications  Labwork: None Ordered  Testing/Procedures: None ordered  Follow-Up: Your physician wants you to follow-up in: 6 Months. You will receive a reminder letter in the mail two months in advance. If you don't receive a letter, please call our office to schedule the follow-up appointment.   Any Other Special Instructions Will Be Listed Below (If Applicable).   If you need a refill on your cardiac medications before your next appointment, please call your pharmacy.

## 2016-07-15 DIAGNOSIS — H401133 Primary open-angle glaucoma, bilateral, severe stage: Secondary | ICD-10-CM | POA: Diagnosis not present

## 2016-07-19 ENCOUNTER — Emergency Department (HOSPITAL_COMMUNITY): Payer: Medicare Other

## 2016-07-19 ENCOUNTER — Telehealth: Payer: Self-pay | Admitting: Cardiology

## 2016-07-19 ENCOUNTER — Observation Stay (HOSPITAL_COMMUNITY)
Admission: EM | Admit: 2016-07-19 | Discharge: 2016-07-21 | Disposition: A | Discharge status: Home / Self Care | Payer: Medicare Other | Attending: Cardiology | Admitting: Cardiology

## 2016-07-19 ENCOUNTER — Encounter: Payer: Self-pay | Admitting: Cardiology

## 2016-07-19 DIAGNOSIS — R079 Chest pain, unspecified: Secondary | ICD-10-CM | POA: Diagnosis present

## 2016-07-19 DIAGNOSIS — I701 Atherosclerosis of renal artery: Secondary | ICD-10-CM | POA: Insufficient documentation

## 2016-07-19 DIAGNOSIS — M810 Age-related osteoporosis without current pathological fracture: Secondary | ICD-10-CM | POA: Insufficient documentation

## 2016-07-19 DIAGNOSIS — M1990 Unspecified osteoarthritis, unspecified site: Secondary | ICD-10-CM | POA: Insufficient documentation

## 2016-07-19 DIAGNOSIS — Z8673 Personal history of transient ischemic attack (TIA), and cerebral infarction without residual deficits: Secondary | ICD-10-CM | POA: Diagnosis not present

## 2016-07-19 DIAGNOSIS — R072 Precordial pain: Secondary | ICD-10-CM

## 2016-07-19 DIAGNOSIS — I110 Hypertensive heart disease with heart failure: Secondary | ICD-10-CM | POA: Diagnosis present

## 2016-07-19 DIAGNOSIS — E785 Hyperlipidemia, unspecified: Secondary | ICD-10-CM | POA: Diagnosis not present

## 2016-07-19 DIAGNOSIS — I700 Atherosclerosis of aorta: Secondary | ICD-10-CM | POA: Diagnosis not present

## 2016-07-19 DIAGNOSIS — I2510 Atherosclerotic heart disease of native coronary artery without angina pectoris: Secondary | ICD-10-CM | POA: Diagnosis not present

## 2016-07-19 DIAGNOSIS — Z955 Presence of coronary angioplasty implant and graft: Secondary | ICD-10-CM | POA: Diagnosis not present

## 2016-07-19 DIAGNOSIS — I252 Old myocardial infarction: Secondary | ICD-10-CM | POA: Insufficient documentation

## 2016-07-19 DIAGNOSIS — Z880 Allergy status to penicillin: Secondary | ICD-10-CM | POA: Insufficient documentation

## 2016-07-19 DIAGNOSIS — M48062 Spinal stenosis, lumbar region with neurogenic claudication: Secondary | ICD-10-CM | POA: Insufficient documentation

## 2016-07-19 DIAGNOSIS — F329 Major depressive disorder, single episode, unspecified: Secondary | ICD-10-CM | POA: Insufficient documentation

## 2016-07-19 DIAGNOSIS — E039 Hypothyroidism, unspecified: Secondary | ICD-10-CM | POA: Diagnosis not present

## 2016-07-19 DIAGNOSIS — R0789 Other chest pain: Principal | ICD-10-CM | POA: Insufficient documentation

## 2016-07-19 DIAGNOSIS — I10 Essential (primary) hypertension: Secondary | ICD-10-CM | POA: Diagnosis not present

## 2016-07-19 DIAGNOSIS — K869 Disease of pancreas, unspecified: Secondary | ICD-10-CM | POA: Insufficient documentation

## 2016-07-19 DIAGNOSIS — R748 Abnormal levels of other serum enzymes: Secondary | ICD-10-CM | POA: Insufficient documentation

## 2016-07-19 DIAGNOSIS — Z7982 Long term (current) use of aspirin: Secondary | ICD-10-CM | POA: Diagnosis not present

## 2016-07-19 DIAGNOSIS — I2584 Coronary atherosclerosis due to calcified coronary lesion: Secondary | ICD-10-CM | POA: Diagnosis not present

## 2016-07-19 DIAGNOSIS — Z91013 Allergy to seafood: Secondary | ICD-10-CM | POA: Diagnosis not present

## 2016-07-19 DIAGNOSIS — I739 Peripheral vascular disease, unspecified: Secondary | ICD-10-CM | POA: Insufficient documentation

## 2016-07-19 DIAGNOSIS — I200 Unstable angina: Secondary | ICD-10-CM | POA: Insufficient documentation

## 2016-07-19 HISTORY — DX: Gastro-esophageal reflux disease without esophagitis: K21.9

## 2016-07-19 HISTORY — DX: Low back pain, unspecified: M54.50

## 2016-07-19 HISTORY — DX: Atherosclerosis of renal artery: I70.1

## 2016-07-19 HISTORY — DX: Low back pain: M54.5

## 2016-07-19 HISTORY — DX: Other chronic pain: G89.29

## 2016-07-19 LAB — CBC
HCT: 33 % — ABNORMAL LOW (ref 36.0–46.0)
HCT: 34.7 % — ABNORMAL LOW (ref 36.0–46.0)
HEMOGLOBIN: 10.9 g/dL — AB (ref 12.0–15.0)
Hemoglobin: 10.3 g/dL — ABNORMAL LOW (ref 12.0–15.0)
MCH: 30.2 pg (ref 26.0–34.0)
MCH: 30.4 pg (ref 26.0–34.0)
MCHC: 31.2 g/dL (ref 30.0–36.0)
MCHC: 31.4 g/dL (ref 30.0–36.0)
MCV: 96.8 fL (ref 78.0–100.0)
MCV: 96.9 fL (ref 78.0–100.0)
PLATELETS: 144 10*3/uL — AB (ref 150–400)
Platelets: 151 10*3/uL (ref 150–400)
RBC: 3.41 MIL/uL — AB (ref 3.87–5.11)
RBC: 3.58 MIL/uL — AB (ref 3.87–5.11)
RDW: 13.8 % (ref 11.5–15.5)
RDW: 13.7 % (ref 11.5–15.5)
WBC: 5.4 10*3/uL (ref 4.0–10.5)
WBC: 5.7 10*3/uL (ref 4.0–10.5)

## 2016-07-19 LAB — TSH: TSH: 0.597 u[IU]/mL (ref 0.350–4.500)

## 2016-07-19 LAB — I-STAT TROPONIN, ED: TROPONIN I, POC: 0.05 ng/mL (ref 0.00–0.08)

## 2016-07-19 LAB — BASIC METABOLIC PANEL
Anion gap: 6 (ref 5–15)
BUN: 16 mg/dL (ref 6–20)
CHLORIDE: 115 mmol/L — AB (ref 101–111)
CO2: 21 mmol/L — ABNORMAL LOW (ref 22–32)
Calcium: 11.3 mg/dL — ABNORMAL HIGH (ref 8.9–10.3)
Creatinine, Ser: 1.06 mg/dL — ABNORMAL HIGH (ref 0.44–1.00)
GFR calc non Af Amer: 45 mL/min — ABNORMAL LOW (ref >60)
GFR, EST AFRICAN AMERICAN: 53 mL/min — AB (ref >60)
Glucose, Bld: 91 mg/dL (ref 65–99)
Potassium: 3.6 mmol/L (ref 3.5–5.1)
SODIUM: 142 mmol/L (ref 135–145)

## 2016-07-19 LAB — COMPREHENSIVE METABOLIC PANEL
ALT: 108 U/L — ABNORMAL HIGH (ref 14–54)
AST: 100 U/L — ABNORMAL HIGH (ref 15–41)
Albumin: 3.4 g/dL — ABNORMAL LOW (ref 3.5–5.0)
Alkaline Phosphatase: 154 U/L — ABNORMAL HIGH (ref 38–126)
Anion gap: 9 (ref 5–15)
BUN: 13 mg/dL (ref 6–20)
CHLORIDE: 113 mmol/L — AB (ref 101–111)
CO2: 22 mmol/L (ref 22–32)
CREATININE: 0.99 mg/dL (ref 0.44–1.00)
Calcium: 11.3 mg/dL — ABNORMAL HIGH (ref 8.9–10.3)
GFR, EST AFRICAN AMERICAN: 57 mL/min — AB (ref >60)
GFR, EST NON AFRICAN AMERICAN: 49 mL/min — AB (ref >60)
Glucose, Bld: 111 mg/dL — ABNORMAL HIGH (ref 65–99)
POTASSIUM: 3.6 mmol/L (ref 3.5–5.1)
Sodium: 144 mmol/L (ref 135–145)
Total Bilirubin: 0.4 mg/dL (ref 0.3–1.2)
Total Protein: 6.8 g/dL (ref 6.5–8.1)

## 2016-07-19 LAB — TROPONIN I: TROPONIN I: 0.03 ng/mL — AB (ref <0.03)

## 2016-07-19 MED ORDER — ONDANSETRON HCL 4 MG/2ML IJ SOLN: 4.0000 mg | Freq: Four times a day (QID) | INTRAMUSCULAR | Status: DC | PRN

## 2016-07-19 MED ORDER — NITROGLYCERIN 0.4 MG SL SUBL: 0.4000 mg | SUBLINGUAL_TABLET | SUBLINGUAL | Status: DC | PRN

## 2016-07-19 MED ORDER — TICAGRELOR 90 MG PO TABS
90.0000 mg | ORAL_TABLET | Freq: Two times a day (BID) | ORAL | Status: DC
  Administered 2016-07-19 – 2016-07-21 (×4): 90 mg via ORAL
  Filled 2016-07-19 (×4): qty 1

## 2016-07-19 MED ORDER — DORZOLAMIDE HCL 2 % OP SOLN
1.0000 [drp] | Freq: Three times a day (TID) | OPHTHALMIC | Status: DC
  Administered 2016-07-19 – 2016-07-21 (×5): 1 [drp] via OPHTHALMIC
  Filled 2016-07-19: qty 10

## 2016-07-19 MED ORDER — METOPROLOL SUCCINATE ER 50 MG PO TB24
50.0000 mg | ORAL_TABLET | Freq: Every day | ORAL | Status: DC
  Administered 2016-07-20: 50 mg via ORAL
  Filled 2016-07-19: qty 1

## 2016-07-19 MED ORDER — PANTOPRAZOLE SODIUM 40 MG PO TBEC
40.0000 mg | DELAYED_RELEASE_TABLET | Freq: Every day | ORAL | Status: DC
  Administered 2016-07-20 – 2016-07-21 (×2): 40 mg via ORAL
  Filled 2016-07-19 (×2): qty 1

## 2016-07-19 MED ORDER — ASPIRIN 81 MG PO TABS: 81.0000 mg | ORAL_TABLET | Freq: Every day | ORAL | Status: DC

## 2016-07-19 MED ORDER — LATANOPROST 0.005 % OP SOLN
1.0000 [drp] | Freq: Every day | OPHTHALMIC | Status: DC
  Administered 2016-07-19 – 2016-07-20 (×2): 1 [drp] via OPHTHALMIC
  Filled 2016-07-19: qty 2.5

## 2016-07-19 MED ORDER — ASPIRIN 81 MG PO CHEW
324.0000 mg | CHEWABLE_TABLET | Freq: Once | ORAL | Status: AC
Start: 2016-07-19 — End: 2016-07-19
  Administered 2016-07-19: 324 mg via ORAL
  Filled 2016-07-19: qty 4

## 2016-07-19 MED ORDER — TIMOLOL MALEATE 0.5 % OP SOLN
1.0000 [drp] | Freq: Two times a day (BID) | OPHTHALMIC | Status: DC
  Administered 2016-07-19 – 2016-07-21 (×4): 1 [drp] via OPHTHALMIC
  Filled 2016-07-19: qty 5

## 2016-07-19 MED ORDER — HEPARIN SODIUM (PORCINE) 5000 UNIT/ML IJ SOLN
5000.0000 [IU] | Freq: Three times a day (TID) | INTRAMUSCULAR | Status: DC
  Administered 2016-07-19 – 2016-07-21 (×5): 5000 [IU] via SUBCUTANEOUS
  Filled 2016-07-19 (×5): qty 1

## 2016-07-19 MED ORDER — TRAMADOL HCL 50 MG PO TABS
50.0000 mg | ORAL_TABLET | Freq: Every day | ORAL | Status: DC
  Filled 2016-07-19: qty 1

## 2016-07-19 MED ORDER — ACETAMINOPHEN 325 MG PO TABS: 650.0000 mg | ORAL_TABLET | ORAL | Status: DC | PRN

## 2016-07-19 MED ORDER — TRAMADOL HCL 50 MG PO TABS: 100.0000 mg | ORAL_TABLET | Freq: Every day | ORAL | Status: DC

## 2016-07-19 MED ORDER — RAMIPRIL 10 MG PO CAPS
10.0000 mg | ORAL_CAPSULE | Freq: Every day | ORAL | Status: DC
  Administered 2016-07-20 – 2016-07-21 (×2): 10 mg via ORAL
  Filled 2016-07-19 (×2): qty 1

## 2016-07-19 MED ORDER — BRIMONIDINE TARTRATE 0.15 % OP SOLN
1.0000 [drp] | Freq: Two times a day (BID) | OPHTHALMIC | Status: DC
  Administered 2016-07-19 – 2016-07-21 (×4): 1 [drp] via OPHTHALMIC
  Filled 2016-07-19: qty 5

## 2016-07-19 MED ORDER — AMLODIPINE BESYLATE 5 MG PO TABS
5.0000 mg | ORAL_TABLET | Freq: Every day | ORAL | Status: DC
  Administered 2016-07-19 – 2016-07-21 (×3): 5 mg via ORAL
  Filled 2016-07-19 (×3): qty 1

## 2016-07-19 MED ORDER — ASPIRIN EC 81 MG PO TBEC
81.0000 mg | DELAYED_RELEASE_TABLET | Freq: Every day | ORAL | Status: DC
  Administered 2016-07-20 – 2016-07-21 (×2): 81 mg via ORAL
  Filled 2016-07-19 (×2): qty 1

## 2016-07-19 NOTE — Telephone Encounter (Signed)
She said Dr Warren Lacy said if pt had any chest pains to call the office-Pt had chest pain yesterday and this morning. She took the nitroglycerin and the pain went away.No chest pains at the present.

## 2016-07-19 NOTE — ED Triage Notes (Signed)
Pt sts left sided CP today that has now resolved after 2 SL nitro

## 2016-07-19 NOTE — H&P (Signed)
History and Physical   Admit date: 07/19/2016 Name:  [name] Medical record number: [id number] DOB/Age:  81/07/1925  83 y.o. female  Referring Physician:   [facility]  Primary Cardiologist:  Hochrein  Primary Physician:  Henrene Pastor  Chief complaint/reason for admission: Chest pain   HPI:  This 83 year old black female has a prior history of coronary artery disease.  She had stents in the right coronary artery in 2003 and up stent in the obtuse marginal in 2006.  She presented with an acute inferior infarction in January and had stenting of the distal right coronary artery with a drug-eluting stent and had angioplasty of the previously stented segments in the right coronary artery.  She had moderate to severe distal left main disease as well as mid LAD stenosis with sutures calcified and the decision was made to treat that medically.  She had not really had much in the way of chest pain but was readmitted with chest pain and had elevated liver enzymes.  At that point her statin was stopped and she also was noted to have some sort of a pancreatic mass that was thought to be cysts.  She was just discharged on the 13th and was seen on the 30th by Dr. Percival Spanish.  She went to the beach and while at the beach had the onset of chest discomfort on Wednesday night and lay down and did not take a nitroglycerin.  She had recurrence of chest pain yesterday.  Today she had 2 additional episodes of chest pain described as pressure and tightness both relieved with nitroglycerin.  She called the office and was advised to come to the emergency room.  She is not currently having chest pain.  She normally lives alone and is fairly active.  She does have a history of peripheral vascular disease and has a previous surgical procedure on her left leg.  Previously it was felt that due to the nature of the left main disease as well as the LAD disease that she should be treated medically unless she had refractory  symptoms.    Past Medical History:  Diagnosis Date  . Abscess 2003   I&D SCM abscess  . Acute ischemic colitis ([place]) 04/10/11   acute episode of ischemic colitis, with abd pain, CT evidence of transverse colitis, and blood streaked BM.  Resolved with conservative treatment.  . Arthritis   . Bacteremia 2003   hx of staph bacteremia  . CAD (coronary artery disease) 2003, 2010   s/p stenting of RCA x2, s/p stent to OM;  LHC 04/2009 in the setting of a NSTEMI: EF 65%, RCA stent patent with 60% ISR proximal and 40% ISR mid, PLV proximal 50-60% and distal 50%, ostial circumflex 30-40%, proximal OM mild in-stent restenosis with a branch with 90+ percent ostial stenosis, proximal LAD 30-40%.  FFR of ostial circumflex:  0.96 - not flow limiting.  Medical therapy was continued.  . Degenerative disc disease   . Depression   . History of medication noncompliance    concerning Plavix  . HTN (hypertension)    Echocardiogram 08/26/11: Mild focal basal septal hypertrophy, EF 31-49%, grade 2 diastolic dysfunction, mild MR.  There was a fluid-filled area likely in the location of the liver noted  . Hyperlipidemia   . Hyperparathyroidism 2003   parathyroid adenoma localized to the right inferior thyroid lobe region  . Hypothyroidism   . Lumbar stenosis with neurogenic claudication 2006   s/p decompression L2-L5  . Osteoporosis  2009   diagnosed by bone density scan in 2009, on Vit D only, no bisphosphonate therapy  . Phlebitis    LLE  . PVD (peripheral vascular disease) ([facility])    s/p left fem-pop bypass graft  . Renal artery stenosis ([facility])    left, 40%  . TIA (transient ischemic attack)    carotid dopplers 5/13: no ICA stenosis     Past Surgical History:  Procedure Laterality Date  . APPENDECTOMY    . BACK SURGERY  06/2004   L3, L4 lami; L2, L5 hemilami; decompresion L2-3, 3-4, 4-5  . CARDIAC CATHETERIZATION N/A 05/06/2016   Procedure: Left Heart Cath and Coronary Angiography;  Surgeon: Lorretta Harp, MD;  Location: [facility];  Service: Cardiovascular;  Laterality: N/A;  . CARDIAC CATHETERIZATION N/A 05/06/2016   Procedure: Coronary Stent Intervention;  Surgeon: Lorretta Harp, MD;  Location: [facility];  Service: Cardiovascular;  Laterality: N/A;  RCA  . CORONARY ANGIOPLASTY WITH STENT PLACEMENT  09/2008  . CORONARY ANGIOPLASTY WITH STENT PLACEMENT  04/2001  . FEMORAL BYPASS  before 2003   LLE  . INCISION AND DRAINAGE ANTERIOR NECK  10/2001   absces sternoclavicular joint   Allergies: is allergic to diphenhydramine hcl; penicillins; and shellfish allergy.   Medications: Prior to Admission medications   Medication Sig Start Date End Date Taking? Authorizing Provider  aspirin 81 MG tablet Take 1 tablet (81 mg total) by mouth daily. 08/21/12   Minus Breeding, MD  bimatoprost (LUMIGAN) 0.03 % ophthalmic drops Apply 1 drop to eye at bedtime.     Historical Provider, MD  brimonidine (ALPHAGAN) 0.15 % ophthalmic solution Place 1 drop into the right eye 2 (two) times daily. 04/16/11   Hester Mates, MD  dorzolamide (TRUSOPT) 2 % ophthalmic solution Place 1 drop into both eyes 3 (three) times daily.    Historical Provider, MD  metoprolol succinate (TOPROL-XL) 50 MG 24 hr tablet TAKE ONE TABLET BY MOUTH EVERY DAY 09/30/12   Minus Breeding, MD  nitroGLYCERIN (NITROSTAT) 0.4 MG SL tablet Place 1 tablet (0.4 mg total) under the tongue every 5 (five) minutes as needed. For chest pain 07/12/16   Minus Breeding, MD  pantoprazole (PROTONIX) 40 MG tablet Take 1 tablet (40 mg total) by mouth daily. 06/26/16   Jennifer Chahn-Yang Choi, DO  ramipril (ALTACE) 10 MG capsule TAKE ONE CAPSULE BY MOUTH EVERY DAY 09/30/12   Minus Breeding, MD  ticagrelor (BRILINTA) 90 MG TABS tablet Take 1 tablet (90 mg total) by mouth 2 (two) times daily. 05/09/16   Arbutus Leas, NP  timolol (BETIMOL) 0.5 % ophthalmic solution Place 1 drop into both eyes 2 (two) times daily.    Historical Provider, MD  traMADol  (ULTRAM) 50 MG tablet Take 1-2 tablets (50-100 mg total) by mouth 2 (two) times daily. Take 2 tablets every morning and take 1 tablet every evening 04/16/11   Hester Mates, MD   Family History:  Family Status  Relation Status  . Mother Deceased   age 62, stroke  . Father Deceased   age 63, stroke  . Maternal Grandmother Deceased  . Maternal Grandfather Deceased  . Paternal Grandmother Deceased  . Paternal Grandfather Deceased   Social History:   reports that she has never smoked. She has never used smokeless tobacco. She reports that she does not drink alcohol or use drugs.   Social History   Social History Narrative   Lives with daughter and granddaughter  PCP: Dr. Reinaldo Meeker in [place]           Review of Systems: She has some generalized arthritis with back trouble she has some vague symptoms of reflux no significant shortness of breath.  Some urinary frequency. Other than as noted above, the remainder of the review of systems is normal  Physical Exam: BP (!) 160/81   Pulse (!) 57   Temp 97.1 F (36.2 C) (Oral)   Resp 20   SpO2 100%  General appearance: She is a thin frail appearing elderly black female in no acute distress Head: Normocephalic, without obvious abnormality, atraumatic Eyes: conjunctivae/corneas clear. PERRL, EOM's intact. Fundi not examined Neck: no adenopathy, no carotid bruit, no JVD and supple, symmetrical, trachea midline Lungs: clear to auscultation bilaterally Heart: Regular rate and rhythm, normal S1 and S2, no S3, 1 to 2/6 systolic murmur Abdomen: soft, non-tender; bowel sounds normal; no masses,  no organomegaly Pelvic: deferred Extremities: No edema present, previous surgical scars present on the left leg, no deformity, no spinal abnormalities Pulses: Femoral pulses are present 2+, peripheral pulses diminished Skin: Skin color, texture, turgor normal. No rashes or lesions Neurologic: Grossly normal  Labs: CBC  Recent Labs   07/19/16 1731  WBC 5.4  RBC 3.41*  HGB 10.3*  HCT 33.0*  PLT 151  MCV 96.8  MCH 30.2  MCHC 31.2  RDW 13.8   CMP   Recent Labs  07/19/16 1731  NA 142  K 3.6  CL 115*  CO2 21*  GLUCOSE 91  BUN 16  CREATININE 1.06*  CALCIUM 11.3*  GFRNONAA 45*  GFRAA 53*   Cardiac Panel (last 3 results) Troponin (Point of Care Test)  Recent Labs  07/19/16 1738  TROPIPOC 0.05   EKG: Lateral T-wave inversions in V5 and V6 with somewhat deeper T-wave inversions in the inferior leads compared to previous EKG in March  Independently reviewed by me  Radiology: Tortuous aorta with calcification, no acute disease   IMPRESSIONS: 1.  Chest pain suggestive of unstable angina 2.  CAD with recent inferior infarction and residual distal left main and LAD disease 3.  Hypertension 4.  Elevated liver enzymes 5.  Hyperlipidemia 6.  History of pancreatic mass recently evaluated 7.  History of TIA 8.  Hypercalcemia  PLAN: She will be brought in for observation.  We will intensify medical therapy and evaluate serial enzymes.  Have interventional cardiology reviewed to determine whether she will need to have a repeat catheterization or intensification of medical therapy at her age. BP  Is elevated and will add amlodipine  To regimen.  Signed: Kerry Hough MD [facility]  07/19/2016, 7:20 PM

## 2016-07-19 NOTE — ED Provider Notes (Signed)
[facility] DEPT Provider Note   CSN: [id number] Arrival date & time: 07/19/16  1712     History   Chief Complaint Chief Complaint  Patient presents with  . Chest Pain    HPI [name] is a 83 y.o. female.  The history is provided by the patient, a relative and medical records.  Chest Pain   This is a recurrent problem. The current episode started 3 to 5 hours ago. Episode frequency: Three times over last 24hrs. The problem has been resolved. The pain is associated with rest. The pain is present in the substernal region. The pain is severe. The quality of the pain is described as pressure-like. The pain does not radiate. Duration of episode(s) is 5 minutes. Pertinent negatives include no abdominal pain, no back pain, no cough, no diaphoresis, no dizziness, no fever, no headaches, no lower extremity edema, no nausea, no near-syncope, no numbness, no palpitations, no shortness of breath, no syncope, no vomiting and no weakness. She has tried nitroglycerin for the symptoms. The treatment provided significant relief. Risk factors: Known CAD.  Her past medical history is significant for CAD, hyperlipidemia, hypertension and MI.  Pertinent negatives for past medical history include no seizures.  Procedure history is positive for cardiac catheterization and echocardiogram.    Past Medical History:  Diagnosis Date  . Abscess 2003   I&D SCM abscess  . Acute ischemic colitis ([place]) 04/10/11   acute episode of ischemic colitis, with abd pain, CT evidence of transverse colitis, and blood streaked BM.  Resolved with conservative treatment.  . Arthritis   . Bacteremia 2003   hx of staph bacteremia  . CAD (coronary artery disease) 2003, 2010   s/p stenting of RCA x2, s/p stent to OM;  LHC 04/2009 in the setting of a NSTEMI: EF 65%, RCA stent patent with 60% ISR proximal and 40% ISR mid, PLV proximal 50-60% and distal 50%, ostial circumflex 30-40%, proximal OM mild in-stent restenosis with a  branch with 90+ percent ostial stenosis, proximal LAD 30-40%.  FFR of ostial circumflex:  0.96 - not flow limiting.  Medical therapy was continued.  . Degenerative disc disease   . Depression   . History of medication noncompliance    concerning Plavix  . HTN (hypertension)    Echocardiogram 08/26/11: Mild focal basal septal hypertrophy, EF 71-69%, grade 2 diastolic dysfunction, mild MR.  There was a fluid-filled area likely in the location of the liver noted  . Hyperlipidemia   . Hyperparathyroidism 2003   parathyroid adenoma localized to the right inferior thyroid lobe region  . Hypothyroidism   . Lumbar stenosis with neurogenic claudication 2006   s/p decompression L2-L5  . Osteoporosis 2009   diagnosed by bone density scan in 2009, on Vit D only, no bisphosphonate therapy  . Phlebitis    LLE  . PVD (peripheral vascular disease) (HCC)    s/p left fem-pop bypass graft  . Renal artery stenosis ([facility])    left, 40%  . TIA (transient ischemic attack)    carotid dopplers 5/13: no ICA stenosis    Patient Active Problem List   Diagnosis Date Noted  . Unstable angina ([place]) 07/19/2016  . Pancreatic mass   . Abnormal liver function test   . 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 History of TIA (transient ischemic attack)   . Depression 04/10/2011  . Peripheral vascular disease ([place]) 04/11/2008  . Hyperlipidemia   . Hypertension   . CAD (coronary artery disease), native  coronary artery     Past Surgical History:  Procedure Laterality Date  . APPENDECTOMY    . BACK SURGERY  06/2004   L3, L4 lami; L2, L5 hemilami; decompresion L2-3, 3-4, 4-5  . CARDIAC CATHETERIZATION N/A 05/06/2016   Procedure: Left Heart Cath and Coronary Angiography;  Surgeon: Lorretta Harp, MD;  Location: [facility];  Service: Cardiovascular;  Laterality: N/A;  . CARDIAC CATHETERIZATION N/A 05/06/2016   Procedure: Coronary Stent Intervention;  Surgeon: Lorretta Harp, MD;  Location: [facility];  Service: Cardiovascular;  Laterality: N/A;  RCA  . CORONARY ANGIOPLASTY WITH STENT PLACEMENT  09/2008  . CORONARY ANGIOPLASTY WITH STENT PLACEMENT  04/2001  . FEMORAL BYPASS  before 2003   LLE  . INCISION AND DRAINAGE ANTERIOR NECK  10/2001   absces sternoclavicular joint    OB History    No data available       Home Medications    Prior to Admission medications   Medication Sig Start Date End Date Taking? Authorizing Provider  aspirin 81 MG tablet Take 1 tablet (81 mg total) by mouth daily. 08/21/12   Minus Breeding, MD  bimatoprost (LUMIGAN) 0.03 % ophthalmic drops Apply 1 drop to eye at bedtime.     Historical Provider, MD  brimonidine (ALPHAGAN) 0.15 % ophthalmic solution Place 1 drop into the right eye 2 (two) times daily. 04/16/11   Hester Mates, MD  dorzolamide (TRUSOPT) 2 % ophthalmic solution Place 1 drop into both eyes 3 (three) times daily.    Historical Provider, MD  metoprolol succinate (TOPROL-XL) 50 MG 24 hr tablet TAKE ONE TABLET BY MOUTH EVERY DAY 09/30/12   Minus Breeding, MD  nitroGLYCERIN (NITROSTAT) 0.4 MG SL tablet Place 1 tablet (0.4 mg total) under the tongue every 5 (five) minutes as needed. For chest pain 07/12/16   Minus Breeding, MD  pantoprazole (PROTONIX) 40 MG tablet Take 1 tablet (40 mg total) by mouth daily. 06/26/16   Jennifer Chahn-Yang Choi, DO  ramipril (ALTACE) 10 MG capsule TAKE ONE CAPSULE BY MOUTH EVERY DAY 09/30/12   Minus Breeding, MD  ticagrelor (BRILINTA) 90 MG TABS tablet Take 1 tablet (90 mg total) by mouth 2 (two) times daily. 05/09/16   Arbutus Leas, NP  timolol (BETIMOL) 0.5 % ophthalmic solution Place 1 drop into both eyes 2 (two) times daily.    Historical Provider, MD  traMADol (ULTRAM) 50 MG tablet Take 1-2 tablets (50-100 mg total) by mouth 2 (two) times daily. Take 2 tablets every morning and take 1 tablet every evening 04/16/11   Hester Mates, MD    Family History Family History  Problem Relation Age of Onset  . Stroke  Mother   . Stroke Father     Social History Social History  Substance Use Topics  . Smoking status: Never Smoker  . Smokeless tobacco: Never Used  . Alcohol use No     Allergies   Diphenhydramine hcl; Penicillins; and Shellfish allergy   Review of Systems Review of Systems  Constitutional: Negative for chills, diaphoresis and fever.  HENT: Negative for ear pain and sore throat.   Eyes: Negative for pain and visual disturbance.  Respiratory: Negative for cough and shortness of breath.   Cardiovascular: Positive for chest pain. Negative for palpitations, syncope and near-syncope.  Gastrointestinal: Negative for abdominal pain, nausea and vomiting.  Genitourinary: Negative for dysuria and hematuria.  Musculoskeletal: Negative for arthralgias and back pain.  Skin: Negative for color change  and rash.  Neurological: Negative for dizziness, seizures, syncope, weakness, numbness and headaches.  All other systems reviewed and are negative.    Physical Exam Updated Vital Signs BP (!) 160/81   Pulse (!) 57   Temp 97.1 F (36.2 C) (Oral)   Resp 20   SpO2 100%   Physical Exam  Constitutional: She is oriented to person, place, and time. She appears well-developed and well-nourished. No distress.  HENT:  Head: Normocephalic and atraumatic.  Eyes: Conjunctivae are normal.  Neck: Neck supple.  Cardiovascular: Regular rhythm.   No murmur heard. Bradycardia in the 50s  Pulmonary/Chest: Effort normal and breath sounds normal. No respiratory distress. She exhibits no tenderness.  Abdominal: Soft. She exhibits no distension. There is no tenderness.  Musculoskeletal: She exhibits no edema.  Neurological: She is alert and oriented to person, place, and time.  Skin: Skin is warm and dry.  Psychiatric: She has a normal mood and affect. Her behavior is normal.  Nursing note and vitals reviewed.    ED Treatments / Results  Labs (all labs ordered are listed, but only abnormal  results are displayed) Labs Reviewed  BASIC METABOLIC PANEL - Abnormal; Notable for the following:       Result Value   Chloride 115 (*)    CO2 21 (*)    Creatinine, Ser 1.06 (*)    Calcium 11.3 (*)    GFR calc non Af Amer 45 (*)    GFR calc Af Amer 53 (*)    All other components within normal limits  CBC - Abnormal; Notable for the following:    RBC 3.41 (*)    Hemoglobin 10.3 (*)    HCT 33.0 (*)    All other components within normal limits  I-STAT TROPOININ, ED    EKG  EKG Interpretation  Date/Time:  Friday July 19 2016 17:22:43 EDT Ventricular Rate:  51 PR Interval:  180 QRS Duration: 68 QT Interval:  422 QTC Calculation: 388 R Axis:   -19 Text Interpretation:  Sinus bradycardia ST & T wave abnormality, consider inferolateral ischemia Abnormal ECG worsenening of TWI in inferolateral leads Confirmed by [place] ([id number]) on 07/19/2016 6:26:26 PM       Radiology Dg Chest 2 View  Result Date: 07/19/2016 CLINICAL DATA:  Midsternal chest pain. EXAM: CHEST  2 VIEW COMPARISON:  06/22/2016 FINDINGS: Cardiomediastinal silhouette is normal. Mediastinal contours appear intact. Tortuosity and calcific atherosclerotic disease of the aorta. There is no evidence of focal airspace consolidation, pleural effusion or pneumothorax. Osseous structures are without acute abnormality. Soft tissues are grossly normal. IMPRESSION: Tortuosity and calcific atherosclerotic disease of the aorta. No evidence of pulmonary consolidation. Electronically Signed   By: Fidela Salisbury M.D.   On: 07/19/2016 17:55    Procedures Procedures (including critical care time)  Medications Ordered in ED Medications  aspirin chewable tablet 324 mg (324 mg Oral Given 07/19/16 1815)     Initial Impression / Assessment and Plan / ED Course  I have reviewed the triage vital signs and the nursing notes.  Pertinent labs & imaging results that were available during my care of the patient were reviewed by me  and considered in my medical decision making (see chart for details).    Pt with h/o recent MI treated with cardiac cath andgioplasty in January 2018, HTN, hyperlipidemia with previous statin intolerance, hypothyroidism, hyperparathyroidism,depression, degenerative disc disease presents with CP. Says she's had three episodes in the last 24hrs; each episode is described as pressure,  localized to the substernal area w/o radiation, w/o associated symptoms, and relieved by a single NTG tab each time. Her granddaughter called the Pt's cardiologist and was told to bring her here for evaluation. No chest pain at the time of presentation.  Chart review shows she had an RCA stent placed w/POBA in Jan '18 & was found to have residual 90% LAD occlusion that wasn't amenable to stenting. She was placed on medical management w/a statin, but it caused LFT abnormalities, so they were d/c'd.  VS & exam as above. EKG: SB @ 55bpm w/TWI in V4-6 not present in tracing from 3/10. CXR WNL. Initial troponin 0.05. ASA given in the ED.  Cardiology consulted & evaluated the Pt in the ED; they will admit the Pt to their service for further evaluation and treatment.   Final Clinical Impressions(s) / ED Diagnoses   Final diagnoses:  Precordial pain    New Prescriptions New Prescriptions   No medications on file     Jenny Reichmann, MD 07/19/16 2354    Fatima Blank, MD 07/20/16 [phone]

## 2016-07-19 NOTE — Telephone Encounter (Signed)
Returned call to patient's grandaughter Dr.Monica Zenaida Niece.She stated grandmother has had 2 episodes of chest pain since yesterday.Stated both episodes relieved by NTG x 1.Stated no chest pain at present.Appointment scheduled with Ellen Henri PA 07/22/16 at 3:00 pm.Advised to go to ED if needed.

## 2016-07-19 NOTE — ED Notes (Signed)
MD in room

## 2016-07-20 DIAGNOSIS — R0789 Other chest pain: Secondary | ICD-10-CM | POA: Diagnosis not present

## 2016-07-20 DIAGNOSIS — I200 Unstable angina: Secondary | ICD-10-CM | POA: Diagnosis not present

## 2016-07-20 LAB — CBC
HEMATOCRIT: 34 % — AB (ref 36.0–46.0)
HEMOGLOBIN: 10.5 g/dL — AB (ref 12.0–15.0)
MCH: 29.9 pg (ref 26.0–34.0)
MCHC: 30.9 g/dL (ref 30.0–36.0)
MCV: 96.9 fL (ref 78.0–100.0)
Platelets: 147 10*3/uL — ABNORMAL LOW (ref 150–400)
RBC: 3.51 MIL/uL — ABNORMAL LOW (ref 3.87–5.11)
RDW: 13.7 % (ref 11.5–15.5)
WBC: 4.9 10*3/uL (ref 4.0–10.5)

## 2016-07-20 LAB — TROPONIN I
Troponin I: 0.03 ng/mL (ref <0.03)
Troponin I: 0.03 ng/mL (ref <0.03)

## 2016-07-20 MED ORDER — METOPROLOL SUCCINATE ER 50 MG PO TB24
75.0000 mg | ORAL_TABLET | Freq: Every day | ORAL | Status: DC
  Administered 2016-07-21: 75 mg via ORAL
  Filled 2016-07-20: qty 1

## 2016-07-20 NOTE — Progress Notes (Signed)
Progress Note  Patient Name: [name] Date of Encounter: 07/20/2016  Primary Cardiologist: Dr. Percival Spanish  Subjective   No chest pain or sob. Feels better.  Inpatient Medications    Scheduled Meds: . amLODipine  5 mg Oral Daily  . aspirin EC  81 mg Oral Daily  . brimonidine  1 drop Right Eye BID  . dorzolamide  1 drop Both Eyes TID  . heparin  5,000 Units Subcutaneous Q8H  . latanoprost  1 drop Both Eyes QHS  . metoprolol succinate  50 mg Oral Daily  . pantoprazole  40 mg Oral Daily  . ramipril  10 mg Oral Daily  . ticagrelor  90 mg Oral BID  . timolol  1 drop Both Eyes BID  . traMADol  100 mg Oral Daily  . traMADol  50 mg Oral QHS   Continuous Infusions:  PRN Meds: acetaminophen, nitroGLYCERIN, ondansetron (ZOFRAN) IV   Vital Signs    Vitals:   07/19/16 2056 07/19/16 2251 07/20/16 0437 07/20/16 0800  BP: (!) 151/90 (!) 146/72 126/61 136/67  Pulse: 61 (!) 57 (!) 53 60  Resp: 16  18 16   Temp: 97.9 F (36.6 C)  98.1 F (36.7 C) 98.5 F (36.9 C)  TempSrc: Oral  Oral Oral  SpO2: 100%  100% 100%  Weight:  102 lb 12.8 oz (46.6 kg)    Height:  5\' 3"  (1.6 m)     No intake or output data in the 24 hours ending 07/20/16 1054 Filed Weights   07/19/16 2251  Weight: 102 lb 12.8 oz (46.6 kg)    Telemetry    nsr - Personally Reviewed  ECG    nsr - Personally Reviewed  Physical Exam   GEN: No acute distress.  Elderly appearing Neck: 6 cm JVD Cardiac: RRR, no murmurs, rubs, or gallops.  Respiratory: Clear to auscultation bilaterally. GI: Soft, nontender, non-distended  MS: No edema; No deformity. Neuro:  Nonfocal  Psych: Normal affect   Labs    Chemistry Recent Labs Lab 07/19/16 1731 07/19/16 2113  NA 142 144  K 3.6 3.6  CL 115* 113*  CO2 21* 22  GLUCOSE 91 111*  BUN 16 13  CREATININE 1.06* 0.99  CALCIUM 11.3* 11.3*  PROT  --  6.8  ALBUMIN  --  3.4*  AST  --  100*  ALT  --  108*  ALKPHOS  --  154*  BILITOT  --  0.4  GFRNONAA 45* 49*    GFRAA 53* 57*  ANIONGAP 6 9     Hematology Recent Labs Lab 07/19/16 1731 07/19/16 2113 07/20/16 0848  WBC 5.4 5.7 4.9  RBC 3.41* 3.58* 3.51*  HGB 10.3* 10.9* 10.5*  HCT 33.0* 34.7* 34.0*  MCV 96.8 96.9 96.9  MCH 30.2 30.4 29.9  MCHC 31.2 31.4 30.9  RDW 13.8 13.7 13.7  PLT 151 144* 147*    Cardiac Enzymes Recent Labs Lab 07/19/16 2113 07/20/16 0213 07/20/16 0848  TROPONINI 0.03* 0.03* 0.03*    Recent Labs Lab 07/19/16 1738  TROPIPOC 0.05     BNPNo results for input(s): BNP, PROBNP in the last 168 hours.   DDimer No results for input(s): DDIMER in the last 168 hours.   Radiology    Dg Chest 2 View  Result Date: 07/19/2016 CLINICAL DATA:  Midsternal chest pain. EXAM: CHEST  2 VIEW COMPARISON:  06/22/2016 FINDINGS: Cardiomediastinal silhouette is normal. Mediastinal contours appear intact. Tortuosity and calcific atherosclerotic disease of the aorta. There is no  evidence of focal airspace consolidation, pleural effusion or pneumothorax. Osseous structures are without acute abnormality. Soft tissues are grossly normal. IMPRESSION: Tortuosity and calcific atherosclerotic disease of the aorta. No evidence of pulmonary consolidation. Electronically Signed   By: Fidela Salisbury M.D.   On: 07/19/2016 17:55    Cardiac Studies    Patient Profile     83 y.o. female admitted with chest pain, ruled out for MI.  Assessment & Plan    1. Chest pain - she has had no more since admit. Her pressure are better 2. CAD - note recent MI. She has residual disease. Will try and uptitrate her meds.  3. HTN heart disease - will adjust her meds.  Signed, Cristopher Peru, MD  07/20/2016, 10:54 AM  Patient ID: [name], female   DOB: Sep 21, 1925, 83 y.o.   MRN: [id number]

## 2016-07-21 DIAGNOSIS — I200 Unstable angina: Secondary | ICD-10-CM | POA: Diagnosis not present

## 2016-07-21 DIAGNOSIS — R079 Chest pain, unspecified: Secondary | ICD-10-CM | POA: Diagnosis present

## 2016-07-21 DIAGNOSIS — R0789 Other chest pain: Secondary | ICD-10-CM | POA: Diagnosis not present

## 2016-07-21 MED ORDER — ACETAMINOPHEN 325 MG PO TABS: 650.0000 mg | ORAL_TABLET | ORAL | Status: DC | PRN

## 2016-07-21 MED ORDER — AMLODIPINE BESYLATE 5 MG PO TABS: 5.0000 mg | ORAL_TABLET | Freq: Every day | ORAL | 6 refills | Status: DC

## 2016-07-21 MED ORDER — METOPROLOL SUCCINATE ER 25 MG PO TB24
75.0000 mg | ORAL_TABLET | Freq: Every day | ORAL | 6 refills | Status: DC
Start: 2016-07-22 — End: 2016-10-17

## 2016-07-21 NOTE — Progress Notes (Signed)
Progress Note  Patient Name: [name] Date of Encounter: 07/21/2016  Primary Cardiologist: Dr. Percival Spanish  Subjective   Pt denies CP or dyspnea  Inpatient Medications    Scheduled Meds: . amLODipine  5 mg Oral Daily  . aspirin EC  81 mg Oral Daily  . brimonidine  1 drop Right Eye BID  . dorzolamide  1 drop Both Eyes TID  . heparin  5,000 Units Subcutaneous Q8H  . latanoprost  1 drop Both Eyes QHS  . metoprolol succinate  75 mg Oral Daily  . pantoprazole  40 mg Oral Daily  . ramipril  10 mg Oral Daily  . ticagrelor  90 mg Oral BID  . timolol  1 drop Both Eyes BID  . traMADol  100 mg Oral Daily  . traMADol  50 mg Oral QHS   Continuous Infusions:  PRN Meds: acetaminophen, nitroGLYCERIN, ondansetron (ZOFRAN) IV   Vital Signs    Vitals:   07/20/16 0800 07/20/16 1411 07/20/16 1932 07/21/16 0516  BP: 136/67 115/67 127/74 119/61  Pulse: 60 (!) 58 (!) 56 (!) 56  Resp: 16 18 16 16   Temp: 98.5 F (36.9 C) 98.1 F (36.7 C) 98.6 F (37 C) 98 F (36.7 C)  TempSrc: Oral Oral Oral Oral  SpO2: 100% 100% 96% 100%  Weight:      Height:        Intake/Output Summary (Last 24 hours) at 07/21/16 0947 Last data filed at 07/20/16 1700  Gross per 24 hour  Intake              360 ml  Output                0 ml  Net              360 ml   Filed Weights   07/19/16 2251  Weight: 102 lb 12.8 oz (46.6 kg)    Telemetry    Sinus- Personally Reviewed  ECG    Sinus with inferolateral TWI - Personally Reviewed  Physical Exam   GEN: No acute distress.  Elderly appearing Neck: supple Cardiac: RRR Respiratory: CTA GI: Soft, nontender, non-distended  MS: No edema; No deformity. Neuro:  Nonfocal  Psych: Normal affect   Labs    Chemistry  Recent Labs Lab 07/19/16 1731 07/19/16 2113  NA 142 144  K 3.6 3.6  CL 115* 113*  CO2 21* 22  GLUCOSE 91 111*  BUN 16 13  CREATININE 1.06* 0.99  CALCIUM 11.3* 11.3*  PROT  --  6.8  ALBUMIN  --  3.4*  AST  --  100*  ALT   --  108*  ALKPHOS  --  154*  BILITOT  --  0.4  GFRNONAA 45* 49*  GFRAA 53* 57*  ANIONGAP 6 9     Hematology  Recent Labs Lab 07/19/16 1731 07/19/16 2113 07/20/16 0848  WBC 5.4 5.7 4.9  RBC 3.41* 3.58* 3.51*  HGB 10.3* 10.9* 10.5*  HCT 33.0* 34.7* 34.0*  MCV 96.8 96.9 96.9  MCH 30.2 30.4 29.9  MCHC 31.2 31.4 30.9  RDW 13.8 13.7 13.7  PLT 151 144* 147*    Cardiac Enzymes  Recent Labs Lab 07/19/16 2113 07/20/16 0213 07/20/16 0848  TROPONINI 0.03* 0.03* 0.03*     Recent Labs Lab 07/19/16 1738  TROPIPOC 0.05     Radiology    Dg Chest 2 View  Result Date: 07/19/2016 CLINICAL DATA:  Midsternal chest pain. EXAM: CHEST  2 VIEW  COMPARISON:  06/22/2016 FINDINGS: Cardiomediastinal silhouette is normal. Mediastinal contours appear intact. Tortuosity and calcific atherosclerotic disease of the aorta. There is no evidence of focal airspace consolidation, pleural effusion or pneumothorax. Osseous structures are without acute abnormality. Soft tissues are grossly normal. IMPRESSION: Tortuosity and calcific atherosclerotic disease of the aorta. No evidence of pulmonary consolidation. Electronically Signed   By: Fidela Salisbury M.D.   On: 07/19/2016 17:55   Patient Profile     83 y.o. female with recent PCI of RCA in setting of MI (residual LM and LAD disease) admitted with chest pain, ruled out for MI.  Assessment & Plan    1. Chest pain - He shouldn't has improved since admission. She has had no recurrent chest pain. I discussed the options with her today including continued medical therapy versus cardiac catheterization. I discussed the risks of catheterization as well as the risk of undiagnosed coronary disease. She would prefer medical therapy. Continue ASA, brilinta, metoprolol and norvasc. Statin DCed due to elevated LFTs. 2. CAD - note recent MI. She has residual disease. Continue ASA, brilinta and metoprolol. 3. HTN - BP controlled; continue present meds 4. Elevated  LFTs-continue off stain; FU GI  DC today if she remains pain free with TOC appt one week and then FU with Dr Percival Spanish  >30 min PA and physician time D2  Signed, Kirk Ruths, MD  07/21/2016, 9:47 AM

## 2016-07-21 NOTE — Discharge Summary (Signed)
Discharge Summary    Patient ID: [name],  MRN: [id number], DOB/AGE: 1926-02-07 83 y.o.  Admit date: 07/19/2016 Discharge date: 07/21/2016  Primary Care Provider: PERRY,LAWRENCE EDWARD Primary Cardiologist: Dr. Percival Spanish   Discharge Diagnoses    Principal Problem:   Chest pain at rest Active Problems:   Hyperlipidemia   Hypertension   CAD (coronary artery disease), native coronary artery   Allergies Allergies  Allergen Reactions  . Diphenhydramine Hcl Other (See Comments)    unknown  . Penicillins Other (See Comments)    unknown  . Shellfish Allergy Other (See Comments)    unknown    Diagnostic Studies/Procedures    none _____________   History of Present Illness     50 yof with hx CAD with stents in RCA 2003 and in 2006 sent to [facility].  Jan. 2018 she presented with acute inf. MI and had DES stent to RCA and PTCA to previous stent in RCA .  She had moderate to severe distal left main disease as well as mid LAD stenosis with sutures calcified and the decision was made to treat that medically.   Her statin was stopped for elevated LFTs.  pancreatic mass that was thought to be cysts.  She has been followed closely.  She then went to the beach and had onset of chest pain resolved with rest.  It returned the next day and relived with NTG.  She came to ER and evaluated and kept for observation with serial troponins and increase therapy.     Hospital Course     Consultants: none   Her troponins were flat - see below.  She had no further chest pain.  By 07/22/15 she was seen and evaluated by Dr. Stanford Breed and after discussion of cardiac cath pt preferred medical therapy.  We will continue off statins until seen by GI.  BP was stable.  Pt walked in the hallway and was without pain and ready for discharge.  She is stable for discharge.  ___________  Discharge Vitals Blood pressure 119/61, pulse (!) 56, temperature 98 F (36.7 C), temperature source Oral, resp. rate 16,  height 5\' 3"  (1.6 m), weight 102 lb 12.8 oz (46.6 kg), SpO2 100 %.  Filed Weights   07/19/16 2251  Weight: 102 lb 12.8 oz (46.6 kg)    Labs & Radiologic Studies    CBC  Recent Labs  07/19/16 2113 07/20/16 0848  WBC 5.7 4.9  HGB 10.9* 10.5*  HCT 34.7* 34.0*  MCV 96.9 96.9  PLT 144* 528*   Basic Metabolic Panel  Recent Labs  07/19/16 1731 07/19/16 2113  NA 142 144  K 3.6 3.6  CL 115* 113*  CO2 21* 22  GLUCOSE 91 111*  BUN 16 13  CREATININE 1.06* 0.99  CALCIUM 11.3* 11.3*   Liver Function Tests  Recent Labs  07/19/16 2113  AST 100*  ALT 108*  ALKPHOS 154*  BILITOT 0.4  PROT 6.8  ALBUMIN 3.4*   No results for input(s): LIPASE, AMYLASE in the last 72 hours. Cardiac Enzymes  Recent Labs  07/19/16 2113 07/20/16 0213 07/20/16 0848  TROPONINI 0.03* 0.03* 0.03*   BNP Invalid input(s): POCBNP D-Dimer No results for input(s): DDIMER in the last 72 hours. Hemoglobin A1C No results for input(s): HGBA1C in the last 72 hours. Fasting Lipid Panel No results for input(s): CHOL, HDL, LDLCALC, TRIG, CHOLHDL, LDLDIRECT in the last 72 hours. Thyroid Function Tests  Recent Labs  07/19/16 2113  TSH 0.597  _____________  Dg Chest 2 View  Result Date: 07/19/2016 CLINICAL DATA:  Midsternal chest pain. EXAM: CHEST  2 VIEW COMPARISON:  06/22/2016 FINDINGS: Cardiomediastinal silhouette is normal. Mediastinal contours appear intact. Tortuosity and calcific atherosclerotic disease of the aorta. There is no evidence of focal airspace consolidation, pleural effusion or pneumothorax. Osseous structures are without acute abnormality. Soft tissues are grossly normal. IMPRESSION: Tortuosity and calcific atherosclerotic disease of the aorta. No evidence of pulmonary consolidation. Electronically Signed   By: Fidela Salisbury M.D.   On: 07/19/2016 17:55   Dg Chest 2 View  Result Date: 06/22/2016 CLINICAL DATA:  Chest pain EXAM: CHEST  2 VIEW COMPARISON:  05/07/2016 chest  radiograph. FINDINGS: Stable cardiomediastinal silhouette with normal heart size and aortic atherosclerosis. No pneumothorax. No pleural effusion. Lungs appear clear, with no acute consolidative airspace disease and no pulmonary edema. Coronary stent overlies the heart. IMPRESSION: No active cardiopulmonary disease. Aortic atherosclerosis. Electronically Signed   By: Ilona Sorrel M.D.   On: 06/22/2016 11:33   US Abdomen Complete  Result Date: 06/22/2016 CLINICAL DATA:  Epigastric pain since 10 a.m. this morning. EXAM: ABDOMEN ULTRASOUND COMPLETE COMPARISON:  Abdomen and pelvis CT 03/11/2014 FINDINGS: Gallbladder: No gallstones or gallbladder wall thickening. No pericholecystic fluid. The sonographer reports no sonographic Murphy's sign. Common bile duct: Diameter: 4 mm Liver: No focal lesion identified. Within normal limits in parenchymal echogenicity. IVC: No abnormality visualized. Pancreas: Obscured by bowel gas Spleen: Size and appearance within normal limits. Right Kidney: Length: 8.1 cm. Cortex looks echogenic compared to the reference liver parenchyma. No hydronephrosis. Left Kidney: Length: 9.2 cm. 3.0 cm simple cyst identified lower pole. Simple cyst was seen in the lower pole left kidney on previous CT over 2 years ago, measuring 2.5 cm at that time. Abdominal aorta: No aneurysm visualized. Other findings: None. IMPRESSION: No acute findings in the abdomen. Specifically, no sonographic evidence to explain the patient's history of pain. Electronically Signed   By: Misty Stanley M.D.   On: 06/22/2016 14:28   Ct Abdomen Pelvis W Contrast  Result Date: 06/23/2016 CLINICAL DATA:  Abnormal liver function tests. EXAM: CT ABDOMEN AND PELVIS WITH CONTRAST TECHNIQUE: Multidetector CT imaging of the abdomen and pelvis was performed using the standard protocol following bolus administration of intravenous contrast. CONTRAST:  65mL ISOVUE-300 IOPAMIDOL (ISOVUE-300) INJECTION 61% COMPARISON:  CT of the abdomen  and pelvis with contrast 03/11/2014 FINDINGS: Lower chest: The lung bases are clear. The heart size normal. No significant pleural or pericardial effusion is present. Hepatobiliary: There is mild diffuse fatty infiltration of liver. No discrete hepatic lesions are present. Mild prominence of the common bile duct is stable. The gallbladder is normal. Pancreas: Ill-defined low-density mass is present at the head of the pancreas measuring 11 x 18 mm. This is increased in size since prior exam. The more distal pancreas is unremarkable. No significant ductal dilation is present. Spleen: Negative Adrenals/Urinary Tract: Chronic fullness of the adrenal glands is present bilaterally without discrete lesions. A 3.1 cm exophytic cyst is present at the lower pole of the left kidney. The kidneys and ureters are otherwise unremarkable. The urinary bladder is within normal limits. Stomach/Bowel: Stomach and duodenum are within normal limits. The small bowel is unremarkable. The appendix is visualized and normal. The ascending and transverse colon are within normal limits. The descending colon is unremarkable. The sigmoid colon is within normal limits. Vascular/Lymphatic: Aortic atherosclerosis. No enlarged abdominal or pelvic lymph nodes. Reproductive: Uterus and bilateral adnexa are unremarkable. Other: No  significant free fluid or free air is present. No abdominal wall hernia is noted. Musculoskeletal: Levoconvex curvature of the lumbar spine is centered at L2 without significant interval change. There marked endplate changes. Degenerative changes are noted in the SI joints. Pelvis is otherwise intact. The hips are located. IMPRESSION: 1. Chronic dilated common bile duct with ill-defined mass lesion at the head of the pancreas. Recommend MRI of the pancreas with and without contrast for GI consult for further evaluation. 2. No discrete hepatic lesions. 3. Extensive atherosclerotic change. 4. Severe scoliosis with chronic marked  degenerative change. Electronically Signed   By: San Morelle M.D.   On: 06/23/2016 21:05   Mr 3d Recon At Scanner  Result Date: 06/25/2016 CLINICAL DATA:  Elevated liver function tests. Pancreatic lesion seen on recent CT. EXAM: MRI ABDOMEN WITHOUT AND WITH CONTRAST (INCLUDING MRCP) TECHNIQUE: Multiplanar multisequence MR imaging of the abdomen was performed both before and after the administration of intravenous contrast. Heavily T2-weighted images of the biliary and pancreatic ducts were obtained, and three-dimensional MRCP images were rendered by post processing. CONTRAST:  74mL MULTIHANCE GADOBENATE DIMEGLUMINE 529 MG/ML IV SOLN COMPARISON:  CT on 06/23/2016 FINDINGS: Lower chest: No acute findings. Hepatobiliary: No masses identified. A few tiny scattered sub-cm cysts are noted. Gallbladder is unremarkable. No evidence of biliary ductal dilatation or choledocholithiasis. Pancreas: Image degradation due to motion artifact . Multiple tiny cystic lesions are seen within the pancreatic head and body, majority measuring less than 1 cm in size. Largest cystic lesion in the pancreatic head measures 1.5 cm on image 26/5. These lesions show no evidence of contrast enhancement or solid component. No evidence of main pancreatic ductal dilatation or pancreas divisum. No evidence of peripancreatic inflammatory changes. Spleen:  Within normal limits in size and appearance. Adrenals/Urinary Tract: No masses identified. 3 cm simple cyst in lower pole of left kidney. No evidence of hydronephrosis. Stomach/Bowel: Visualized portions within the abdomen are unremarkable. Vascular/Lymphatic: No pathologically enlarged lymph nodes identified. No abdominal aortic aneurysm. Other:  None. Musculoskeletal: No suspicious bone lesions identified. Thoracolumbar spondylosis and levoscoliosis. IMPRESSION: Multiple tiny cystic lesions throughout the pancreas, with dominant lesion in the pancreatic head measuring 1.5 cm. 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 No evidence of main pancreatic ductal dilatation. Recommend continued followup with abdomen MRI without and with contrast in 2 years. This recommendation follows ACR consensus guidelines: Management of Incidental Pancreatic Cysts: A White Paper of the ACR Incidental Findings Committee. [place] 6789;38:101-751. No significant hepatobiliary abnormality identified. Electronically Signed   By: Earle Gell M.D.   On: 06/25/2016 08:15   Mr Abdomen Mrcp Moise Boring Contast  Result Date: 06/25/2016 CLINICAL DATA:  Elevated liver function tests. Pancreatic lesion seen on recent CT. EXAM: MRI ABDOMEN WITHOUT AND WITH CONTRAST (INCLUDING MRCP) TECHNIQUE: Multiplanar multisequence MR imaging of the abdomen was performed both before and after the administration of intravenous contrast. Heavily T2-weighted images of the biliary and pancreatic ducts were obtained, and three-dimensional MRCP images were rendered by post processing. CONTRAST:  3mL MULTIHANCE GADOBENATE DIMEGLUMINE 529 MG/ML IV SOLN COMPARISON:  CT on 06/23/2016 FINDINGS: Lower chest: No acute findings. Hepatobiliary: No masses identified. A few tiny scattered sub-cm cysts are noted. Gallbladder is unremarkable. No evidence of biliary ductal dilatation or choledocholithiasis. Pancreas: Image degradation due to motion artifact . Multiple tiny cystic lesions are seen within the pancreatic head and body, majority measuring less than 1 cm in size. Largest cystic lesion in the  pancreatic head measures 1.5 cm on image 26/5. These lesions show no evidence of contrast enhancement or solid component. No evidence of main pancreatic ductal dilatation or pancreas divisum. No evidence of peripancreatic inflammatory changes. Spleen:  Within normal limits in size and appearance. Adrenals/Urinary Tract: No masses identified. 3 cm simple cyst in lower pole of left kidney. No evidence of hydronephrosis.  Stomach/Bowel: Visualized portions within the abdomen are unremarkable. Vascular/Lymphatic: No pathologically enlarged lymph nodes identified. No abdominal aortic aneurysm. Other:  None. Musculoskeletal: No suspicious bone lesions identified. Thoracolumbar spondylosis and levoscoliosis. IMPRESSION: Multiple tiny cystic lesions throughout the pancreas, with dominant lesion in the pancreatic head measuring 1.5 cm. 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 No evidence of main pancreatic ductal dilatation. Recommend continued followup with abdomen MRI without and with contrast in 2 years. This recommendation follows ACR consensus guidelines: Management of Incidental Pancreatic Cysts: A White Paper of the ACR Incidental Findings Committee. [place] 9485;46:270-350. No significant hepatobiliary abnormality identified. Electronically Signed   By: Earle Gell M.D.   On: 06/25/2016 08:15   Disposition   Pt is being discharged home today in good condition.  Follow-up Plans & Appointments       Discharge Medications   Current Discharge Medication List    START taking these medications   Details  acetaminophen (TYLENOL) 325 MG tablet Take 2 tablets (650 mg total) by mouth every 4 (four) hours as needed for headache or mild pain.    amLODipine (NORVASC) 5 MG tablet Take 1 tablet (5 mg total) by mouth daily. Qty: 30 tablet, Refills: 6      CONTINUE these medications which have CHANGED   Details  metoprolol succinate (TOPROL-XL) 25 MG 24 hr tablet Take 3 tablets (75 mg total) by mouth daily. Take with or immediately following a meal. Qty: 45 tablet, Refills: 6      CONTINUE these medications which have NOT CHANGED   Details  aspirin 81 MG tablet Take 1 tablet (81 mg total) by mouth daily.    bimatoprost (LUMIGAN) 0.01 % SOLN Place 1 drop into both eyes at bedtime.     dorzolamide (TRUSOPT) 2 % ophthalmic solution Place 1 drop into both eyes 3 (three) times  daily.    nitroGLYCERIN (NITROSTAT) 0.4 MG SL tablet Place 1 tablet (0.4 mg total) under the tongue every 5 (five) minutes as needed. For chest pain Qty: 25 tablet, Refills: 2    pantoprazole (PROTONIX) 40 MG tablet Take 1 tablet (40 mg total) by mouth daily. Qty: 30 tablet, Refills: 0    ramipril (ALTACE) 10 MG capsule TAKE ONE CAPSULE BY MOUTH EVERY DAY Qty: 30 capsule, Refills: 6    ticagrelor (BRILINTA) 90 MG TABS tablet Take 1 tablet (90 mg total) by mouth 2 (two) times daily. Qty: 60 tablet, Refills: 12    timolol (BETIMOL) 0.5 % ophthalmic solution Place 1 drop into both eyes 2 (two) times daily.    traMADol (ULTRAM) 50 MG tablet Take 1-2 tablets (50-100 mg total) by mouth 2 (two) times daily. Take 2 tablets every morning and take 1 tablet every evening Qty: 30 tablet, Refills: 0    brimonidine (ALPHAGAN) 0.15 % ophthalmic solution Place 1 drop into the right eye 2 (two) times daily. Qty: 5 mL, Refills: 0      STOP taking these medications     brimonidine (ALPHAGAN) 0.2 % ophthalmic solution            Outstanding Labs/Studies  none  Duration of Discharge Encounter   Greater than 30 minutes including physician time.  Signed, Cecilie Kicks NP 07/21/2016, 11:22 AM

## 2016-07-21 NOTE — Progress Notes (Signed)
Pt ambulated in the hallway without chest pain and tolerated well.

## 2016-07-21 NOTE — Discharge Instructions (Signed)
Call the office for any problems or questions.   Heart Healthy diet.

## 2016-07-22 ENCOUNTER — Ambulatory Visit: Payer: Medicare Other | Admitting: Cardiology

## 2016-08-08 ENCOUNTER — Ambulatory Visit (INDEPENDENT_AMBULATORY_CARE_PROVIDER_SITE_OTHER): Payer: Medicare Other | Admitting: Cardiology

## 2016-08-08 ENCOUNTER — Encounter: Payer: Self-pay | Admitting: Cardiology

## 2016-08-08 VITALS — BP 122/60 | HR 50 | Ht 63.0 in | Wt 104.1 lb

## 2016-08-08 DIAGNOSIS — I2111 ST elevation (STEMI) myocardial infarction involving right coronary artery: Secondary | ICD-10-CM

## 2016-08-08 DIAGNOSIS — R079 Chest pain, unspecified: Secondary | ICD-10-CM | POA: Diagnosis not present

## 2016-08-08 MED ORDER — PANTOPRAZOLE SODIUM 40 MG PO TBEC: 40.0000 mg | DELAYED_RELEASE_TABLET | Freq: Every day | ORAL | 5 refills | Status: DC

## 2016-08-08 NOTE — Progress Notes (Signed)
08/08/2016 [name]   1926/02/20  [id number]  Primary Physician PERRY,LAWRENCE Cindy Miller, MD Primary Cardiologist: Dr. Percival Spanish   Reason for Visit/CC: Post hospital follow-up for chest pain  HPI:  [name] is a 83 y.o. female who is being seen today for post hospital follow-up after recent admission for chest pain. She is an 83 year old female with known history of coronary disease, with stents in RCA 2003 and in 2006 sent to [facility].  Jan. 2018, she presented with acute inf. MI and had DES stent to RCA and PTCA to previous stent in RCA .  She had moderate to severe distal left main disease as well as mid LAD stenosis with sutures calcified and the decision was made to treat that medically.   Her statin was stopped for elevated LFTs.  Also with h/o pancreatic mass that was thought to be cysts. This has been followed closely.    She presented to [facility] on 07/19/2016 with complaint of resting chest pain. She was admitted for observation and rule out. Troponins were cycled 3 and were slightly abnormal with a flat trend (0.03, 0.03, 0.03). Her CP resolved. Options were discussed including repeat left heart catheterization, however the patient opted on medical therapy. She was seen by Dr. Stanford Breed who agreed with this. Medical therapy was continued. Her tonics was also added for likely GERD.  She presents back to clinic today for follow-up. She is chest pain-free. She has had some recurrence in chest discomfort but she has found an association with meals. Certain foods tend to trigger her discomfort and is often worse after eating. She denies any exertional chest pain or dyspnea. BP is well controlled in clinic today. EKG shows sinus bradycardia with a heart rate of 50 bpm. No ischemia. She is on beta blocker therapy with Metroprolol. She is asymptomatic with her bradycardia. She denies dizziness, lightheadedness, dizziness, fatigue or syncope/near-syncope. She thinks that Protonix is  helping her symptoms.  Current Meds  Medication Sig  . acetaminophen (TYLENOL) 325 MG tablet Take 2 tablets (650 mg total) by mouth every 4 (four) hours as needed for headache or mild pain.  Marland Kitchen amLODipine (NORVASC) 5 MG tablet Take 1 tablet (5 mg total) by mouth daily.  Marland Kitchen aspirin 81 MG tablet Take 1 tablet (81 mg total) by mouth daily.  . bimatoprost (LUMIGAN) 0.01 % SOLN Place 1 drop into both eyes at bedtime.   . brimonidine (ALPHAGAN) 0.15 % ophthalmic solution Place 1 drop into the right eye 2 (two) times daily.  . dorzolamide (TRUSOPT) 2 % ophthalmic solution Place 1 drop into both eyes 3 (three) times daily.  . metoprolol succinate (TOPROL-XL) 25 MG 24 hr tablet Take 3 tablets (75 mg total) by mouth daily. Take with or immediately following a meal.  . nitroGLYCERIN (NITROSTAT) 0.4 MG SL tablet Place 1 tablet (0.4 mg total) under the tongue every 5 (five) minutes as needed. For chest pain  . pantoprazole (PROTONIX) 40 MG tablet Take 1 tablet (40 mg total) by mouth daily.  . ramipril (ALTACE) 10 MG capsule TAKE ONE CAPSULE BY MOUTH EVERY DAY  . ticagrelor (BRILINTA) 90 MG TABS tablet Take 1 tablet (90 mg total) by mouth 2 (two) times daily.  . timolol (BETIMOL) 0.5 % ophthalmic solution Place 1 drop into both eyes 2 (two) times daily.  . traMADol (ULTRAM) 50 MG tablet Take 50 mg by mouth 2 (two) times daily as needed for moderate pain or severe pain.  Allergies  Allergen Reactions  . Diphenhydramine Hcl Other (See Comments)    unknown  . Penicillins Other (See Comments)    unknown  . Shellfish Allergy Other (See Comments)    unknown   Past Medical History:  Diagnosis Date  . Abscess 2003   I&D SCM abscess  . Acute ischemic colitis ([place]) 04/10/11   acute episode of ischemic colitis, with abd pain, CT evidence of transverse colitis, and blood streaked BM.  Resolved with conservative treatment.  . Arthritis    "hands, joints, knees" (07/19/2016)  . Bacteremia 2003   hx of staph  bacteremia  . CAD (coronary artery disease) 2003, 2010   s/p stenting of RCA x2, s/p stent to OM;  LHC 04/2009 in the setting of a NSTEMI: EF 65%, RCA stent patent with 60% ISR proximal and 40% ISR mid, PLV proximal 50-60% and distal 50%, ostial circumflex 30-40%, proximal OM mild in-stent restenosis with a branch with 90+ percent ostial stenosis, proximal LAD 30-40%.  FFR of ostial circumflex:  0.96 - not flow limiting.  Medical therapy was continued.  . Chronic lower back pain    "not a big problem" (07/19/2016)  . Degenerative disc disease   . GERD (gastroesophageal reflux disease)   . History of medication noncompliance    concerning Plavix  . HTN (hypertension)    Echocardiogram 08/26/11: Mild focal basal septal hypertrophy, EF 45-80%, grade 2 diastolic dysfunction, mild MR.  There was a fluid-filled area likely in the location of the liver noted  . Hyperlipidemia   . Hyperparathyroidism 2003   parathyroid adenoma localized to the right inferior thyroid lobe region  . Hypothyroidism   . Left renal artery stenosis (HCC)    40%  . Lumbar stenosis with neurogenic claudication 2006   s/p decompression L2-L5  . Myocardial infarction [facility]) ~ 2013; 05/06/2016  . Osteoporosis 2009   diagnosed by bone density scan in 2009, on Vit D only, no bisphosphonate therapy  . Phlebitis    LLE  . PVD (peripheral vascular disease) (HCC)    s/p left fem-pop bypass graft  . TIA (transient ischemic attack)    carotid dopplers 5/13: no ICA stenosis   Family History  Problem Relation Age of Onset  . Stroke Mother   . Stroke Father    Past Surgical History:  Procedure Laterality Date  . CARDIAC CATHETERIZATION N/A 05/06/2016   Procedure: Left Heart Cath and Coronary Angiography;  Surgeon: Lorretta Harp, MD;  Location: [facility];  Service: Cardiovascular;  Laterality: N/A;  . CARDIAC CATHETERIZATION N/A 05/06/2016   Procedure: Coronary Stent Intervention;  Surgeon: Lorretta Harp, MD;   Location: [place] [facility];  Service: Cardiovascular;  Laterality: N/A;  RCA  . CATARACT EXTRACTION, BILATERAL Bilateral   . CORONARY ANGIOPLASTY WITH STENT PLACEMENT  09/2008  . CORONARY ANGIOPLASTY WITH STENT PLACEMENT  04/2001  . ENDOSCOPIC HEMILAMINOTOMY W/ DISCECTOMY LUMBAR  06/2004   L3, L4 lami; L2, L5 hemilami; decompresion L2-3, 3-4, 4-  . FEMORAL BYPASS  before 2003   LLE  . INCISION AND DRAINAGE ANTERIOR NECK  10/2001   absces sternoclavicular joint   Social History   Social History  . Marital status: Widowed    Spouse name: N/A  . Number of children: N/A  . Years of education: N/A   Occupational History  . Not on file.   Social History Main Topics  . Smoking status: Never Smoker  . Smokeless tobacco: Never Used  . Alcohol  use No  . Drug use: No  . Sexual activity: No   Other Topics Concern  . Not on file   Social History Narrative   Lives with daughter and granddaughter    PCP: Dr. Reinaldo Meeker in [place]           Review of Systems: General: negative for chills, fever, night sweats or weight changes.  Cardiovascular: negative for chest pain, dyspnea on exertion, edema, orthopnea, palpitations, paroxysmal nocturnal dyspnea or shortness of breath Dermatological: negative for rash Respiratory: negative for cough or wheezing Urologic: negative for hematuria Abdominal: negative for nausea, vomiting, diarrhea, bright red blood per rectum, melena, or hematemesis Neurologic: negative for visual changes, syncope, or dizziness All other systems reviewed and are otherwise negative except as noted above.   Physical Exam:  Blood pressure 122/60, pulse (!) 50, height 5\' 3"  (1.6 m), weight 104 lb 1.9 oz (47.2 kg).  General appearance: alert, cooperative, no distress and elderly  Neck: no carotid bruit and no JVD Lungs: clear to auscultation bilaterally Heart: regular rate and rhythm, S1, S2 normal, no murmur, click, rub or gallop Extremities: extremities normal,  atraumatic, no cyanosis or edema Pulses: 2+ and symmetric Skin: Skin color, texture, turgor normal. No rashes or lesions Neurologic: Grossly normal  EKG sinus bradycardia 50 bpm  -- personally reviewed   ASSESSMENT AND PLAN:   1. Chest Pain: Recent chest pain seems more consistent with GI etiology such as GERD. She has substernal chest discomfort after meals that is also exacerbated by certain foods. She denies any exertional chest pain or dyspnea. Recent hospitalization showed low level flat troponin trend, not consistent with acute coronary syndrome. Continue PPI therapy with Protonix for GERD.  2. CAD: stents in RCA 2003 and in 2006 sent to [facility].  Jan. 2018, she presented with acute inf. MI and had DES stent to RCA and PTCA to previous stent in RCA .  She had moderate to severe distal left main disease as well as mid LAD stenosis with sutures calcified and the decision was made to treat that medically. No recent ischemic CP. Continue medical therapy with ASA, Brilinta, ACE, BB and CCB. No statin given elevated LFTs.    Follow-Up: continue routine f/u with her primary cardiologist. She has an appt with Dr. Percival Spanish in July.   Brittainy Ladoris Gene, MHS [facility] 08/08/2016 3:46 PM

## 2016-08-08 NOTE — Patient Instructions (Signed)
Your physician recommends that you schedule a follow-up appointment in: July 2018 with Dr. Percival Spanish

## 2016-09-10 DIAGNOSIS — R945 Abnormal results of liver function studies: Secondary | ICD-10-CM | POA: Diagnosis not present

## 2016-09-10 DIAGNOSIS — R634 Abnormal weight loss: Secondary | ICD-10-CM | POA: Diagnosis not present

## 2016-09-10 DIAGNOSIS — K862 Cyst of pancreas: Secondary | ICD-10-CM | POA: Diagnosis not present

## 2016-09-10 DIAGNOSIS — D649 Anemia, unspecified: Secondary | ICD-10-CM | POA: Diagnosis not present

## 2016-10-14 DIAGNOSIS — E782 Mixed hyperlipidemia: Secondary | ICD-10-CM | POA: Diagnosis not present

## 2016-10-14 DIAGNOSIS — I2510 Atherosclerotic heart disease of native coronary artery without angina pectoris: Secondary | ICD-10-CM | POA: Diagnosis not present

## 2016-10-14 DIAGNOSIS — M810 Age-related osteoporosis without current pathological fracture: Secondary | ICD-10-CM | POA: Diagnosis not present

## 2016-10-14 DIAGNOSIS — I10 Essential (primary) hypertension: Secondary | ICD-10-CM | POA: Diagnosis not present

## 2016-10-14 DIAGNOSIS — R072 Precordial pain: Secondary | ICD-10-CM | POA: Diagnosis not present

## 2016-10-17 ENCOUNTER — Other Ambulatory Visit: Payer: Self-pay | Admitting: Cardiology

## 2016-10-21 ENCOUNTER — Telehealth: Payer: Self-pay | Admitting: Cardiology

## 2016-10-22 ENCOUNTER — Ambulatory Visit: Payer: Medicare Other | Admitting: Cardiology

## 2016-10-24 DIAGNOSIS — H401132 Primary open-angle glaucoma, bilateral, moderate stage: Secondary | ICD-10-CM | POA: Diagnosis not present

## 2016-10-24 DIAGNOSIS — H401133 Primary open-angle glaucoma, bilateral, severe stage: Secondary | ICD-10-CM | POA: Diagnosis not present

## 2016-10-29 NOTE — Telephone Encounter (Signed)
Closed Encounter  °

## 2016-11-18 ENCOUNTER — Other Ambulatory Visit: Payer: Self-pay | Admitting: Cardiology

## 2016-12-15 ENCOUNTER — Other Ambulatory Visit: Payer: Self-pay | Admitting: Cardiology

## 2016-12-17 NOTE — Telephone Encounter (Signed)
Rx(s) sent to pharmacy electronically.  

## 2017-01-09 ENCOUNTER — Other Ambulatory Visit: Payer: Self-pay | Admitting: Cardiology

## 2017-01-16 NOTE — Progress Notes (Signed)
HPI The patient presents for followup of known coronary disease. This is her first appt since July of 2015.   She was in the hospital in Jan with NSTEMI.   She had an acute inferior lateral STEMI.   This was felt to be secondary to RCA occlusion. She had stenting of distal RCA into PDA with DES. The remainder of RCA was treated with POBA. She does have residual disease in mid LAD up to 90%. This is a heavily calcified vessel. Per Dr. Martinique  the left main disease is significant.  The LAD disease is acomplex lesion and would require atherectomy. Given her age the decision was to manage medically but if she has recurrent angina it would be reasonable to consider PCI of LAD.  She was started on a statin. She came back to the hospital with chest pain but this time was found to have elevated liver enzymes.  She was taken off statin.   Since I last saw her she has done relatively well. She's fatigued and gets around slowly with her cane but she still goes to church when she wants to and goes out to dinner as somebody will take her.  The patient denies any new symptoms such as chest discomfort, neck or arm discomfort. There has been no new shortness of breath, PND or orthopnea. There have been no reported palpitations, presyncope or syncope.  Allergies  Allergen Reactions  . Diphenhydramine Hcl Other (See Comments)    unknown  . Penicillins Other (See Comments)    unknown  . Shellfish Allergy Other (See Comments)    unknown    Current Outpatient Prescriptions  Medication Sig Dispense Refill  . acetaminophen (TYLENOL) 325 MG tablet Take 2 tablets (650 mg total) by mouth every 4 (four) hours as needed for headache or mild pain.    Marland Kitchen amLODipine (NORVASC) 5 MG tablet Take 1 tablet (5 mg total) by mouth daily. 30 tablet 6  . aspirin 81 MG tablet Take 1 tablet (81 mg total) by mouth daily.    . bimatoprost (LUMIGAN) 0.01 % SOLN Place 1 drop into both eyes at bedtime.     . brimonidine (ALPHAGAN) 0.15 %  ophthalmic solution Place 1 drop into the right eye 2 (two) times daily. 5 mL 0  . dorzolamide (TRUSOPT) 2 % ophthalmic solution Place 1 drop into both eyes 3 (three) times daily.    . metoprolol succinate (TOPROL-XL) 25 MG 24 hr tablet TAKE 3 TABLETS (75 MG TOTAL) BY MOUTH DAILY. TAKE WITH OR IMMEDIATELY FOLLOWING A MEAL. 90 tablet 6  . nitroGLYCERIN (NITROSTAT) 0.4 MG SL tablet Place 1 tablet (0.4 mg total) under the tongue every 5 (five) minutes as needed. For chest pain 25 tablet 2  . pantoprazole (PROTONIX) 40 MG tablet TAKE 1 TABLET BY MOUTH EVERY DAY 30 tablet 5  . ramipril (ALTACE) 10 MG capsule TAKE ONE CAPSULE BY MOUTH EVERY DAY 30 capsule 6  . ticagrelor (BRILINTA) 90 MG TABS tablet Take 1 tablet (90 mg total) by mouth 2 (two) times daily. 60 tablet 12  . timolol (BETIMOL) 0.5 % ophthalmic solution Place 1 drop into both eyes 2 (two) times daily.    . traMADol (ULTRAM) 50 MG tablet Take 50 mg by mouth 2 (two) times daily as needed for moderate pain or severe pain.      No current facility-administered medications for this visit.     Past Medical History:  Diagnosis Date  . Abscess 2003   I&D  SCM abscess  . Acute ischemic colitis ([place]) 04/10/11   acute episode of ischemic colitis, with abd pain, CT evidence of transverse colitis, and blood streaked BM.  Resolved with conservative treatment.  . Arthritis    "hands, joints, knees" (07/19/2016)  . Bacteremia 2003   hx of staph bacteremia  . CAD (coronary artery disease) 2003, 2010   s/p stenting of RCA x2, s/p stent to OM;  LHC 04/2009 in the setting of a NSTEMI: EF 65%, RCA stent patent with 60% ISR proximal and 40% ISR mid, PLV proximal 50-60% and distal 50%, ostial circumflex 30-40%, proximal OM mild in-stent restenosis with a branch with 90+ percent ostial stenosis, proximal LAD 30-40%.  FFR of ostial circumflex:  0.96 - not flow limiting.  Medical therapy was continued.  . Chronic lower back pain    "not a big problem" (07/19/2016)    . Degenerative disc disease   . GERD (gastroesophageal reflux disease)   . History of medication noncompliance    concerning Plavix  . HTN (hypertension)    Echocardiogram 08/26/11: Mild focal basal septal hypertrophy, EF 62-83%, grade 2 diastolic dysfunction, mild MR.  There was a fluid-filled area likely in the location of the liver noted  . Hyperlipidemia   . Hyperparathyroidism 2003   parathyroid adenoma localized to the right inferior thyroid lobe region  . Hypothyroidism   . Left renal artery stenosis (HCC)    40%  . Lumbar stenosis with neurogenic claudication 2006   s/p decompression L2-L5  . Myocardial infarction [facility]) ~ 2013; 05/06/2016  . Osteoporosis 2009   diagnosed by bone density scan in 2009, on Vit D only, no bisphosphonate therapy  . Phlebitis    LLE  . PVD (peripheral vascular disease) (HCC)    s/p left fem-pop bypass graft  . TIA (transient ischemic attack)    carotid dopplers 5/13: no ICA stenosis    Past Surgical History:  Procedure Laterality Date  . CARDIAC CATHETERIZATION N/A 05/06/2016   Procedure: Left Heart Cath and Coronary Angiography;  Surgeon: Lorretta Harp, MD;  Location: [place] [facility];  Service: Cardiovascular;  Laterality: N/A;  . CARDIAC CATHETERIZATION N/A 05/06/2016   Procedure: Coronary Stent Intervention;  Surgeon: Lorretta Harp, MD;  Location: [place] [facility];  Service: Cardiovascular;  Laterality: N/A;  RCA  . CATARACT EXTRACTION, BILATERAL Bilateral   . CORONARY ANGIOPLASTY WITH STENT PLACEMENT  09/2008  . CORONARY ANGIOPLASTY WITH STENT PLACEMENT  04/2001  . ENDOSCOPIC HEMILAMINOTOMY W/ DISCECTOMY LUMBAR  06/2004   L3, L4 lami; L2, L5 hemilami; decompresion L2-3, 3-4, 4-  . FEMORAL BYPASS  before 2003   LLE  . INCISION AND DRAINAGE ANTERIOR NECK  10/2001   absces sternoclavicular joint    ROS:    As stated in the HPI and negative for all other systems.    PHYSICAL EXAM BP 121/70   Pulse (!) 57   Ht 5\' 1"  (1.549 m)    Wt 95 lb 3.2 oz (43.2 kg)   SpO2 99%   BMI 17.99 kg/m   GENERAL:  Well appearing, frail but looks her age NECK:  No jugular venous distention, waveform within normal limits, carotid upstroke brisk and symmetric, no bruits, no thyromegaly LUNGS:  Clear to auscultation bilaterally CHEST:  Unremarkable HEART:  PMI not displaced or sustained,S1 and S2 within normal limits, no S3, no S4, no clicks, no rubs, no murmurs ABD:  Flat, positive bowel sounds normal in frequency in pitch, no bruits, no  rebound, no guarding, no midline pulsatile mass, no hepatomegaly, no splenomegaly EXT:  2 plus pulses throughout, no edema, no cyanosis no clubbing     ASSESSMENT AND PLAN  CAD:   The patient has no new sypmtoms.  No further cardiovascular testing is indicated.  We will continue with aggressive risk reduction and meds as listed.  I will see her back in January to consider whether or I want to stop Brilinta.   HTN:  The blood pressure is at target. No change in medications is indicated. We will continue with therapeutic lifestyle changes (TLC).  DYSLIPIDEMIA:      She is intolerant of statins.    No change in therapy.

## 2017-01-17 ENCOUNTER — Encounter: Payer: Self-pay | Admitting: Cardiology

## 2017-01-17 ENCOUNTER — Ambulatory Visit (INDEPENDENT_AMBULATORY_CARE_PROVIDER_SITE_OTHER): Payer: Medicare Other | Admitting: Cardiology

## 2017-01-17 VITALS — BP 121/70 | HR 57 | Ht 61.0 in | Wt 95.2 lb

## 2017-01-17 DIAGNOSIS — I2510 Atherosclerotic heart disease of native coronary artery without angina pectoris: Secondary | ICD-10-CM | POA: Diagnosis not present

## 2017-01-17 DIAGNOSIS — I10 Essential (primary) hypertension: Secondary | ICD-10-CM | POA: Diagnosis not present

## 2017-01-17 DIAGNOSIS — I2111 ST elevation (STEMI) myocardial infarction involving right coronary artery: Secondary | ICD-10-CM

## 2017-01-17 NOTE — Patient Instructions (Signed)
Your physician recommends that you schedule a follow-up appointment in: January 2019

## 2017-01-19 ENCOUNTER — Encounter: Payer: Self-pay | Admitting: Cardiology

## 2017-01-28 ENCOUNTER — Other Ambulatory Visit: Payer: Self-pay | Admitting: Cardiology

## 2017-04-03 IMAGING — CR DG CHEST 2V
2 series · 2 of 2 positions shown · non-contrast
Comparison: 05/07/2016 chest radiograph.

CLINICAL DATA: Chest pain

EXAM:
CHEST  2 VIEW

[chest pa]
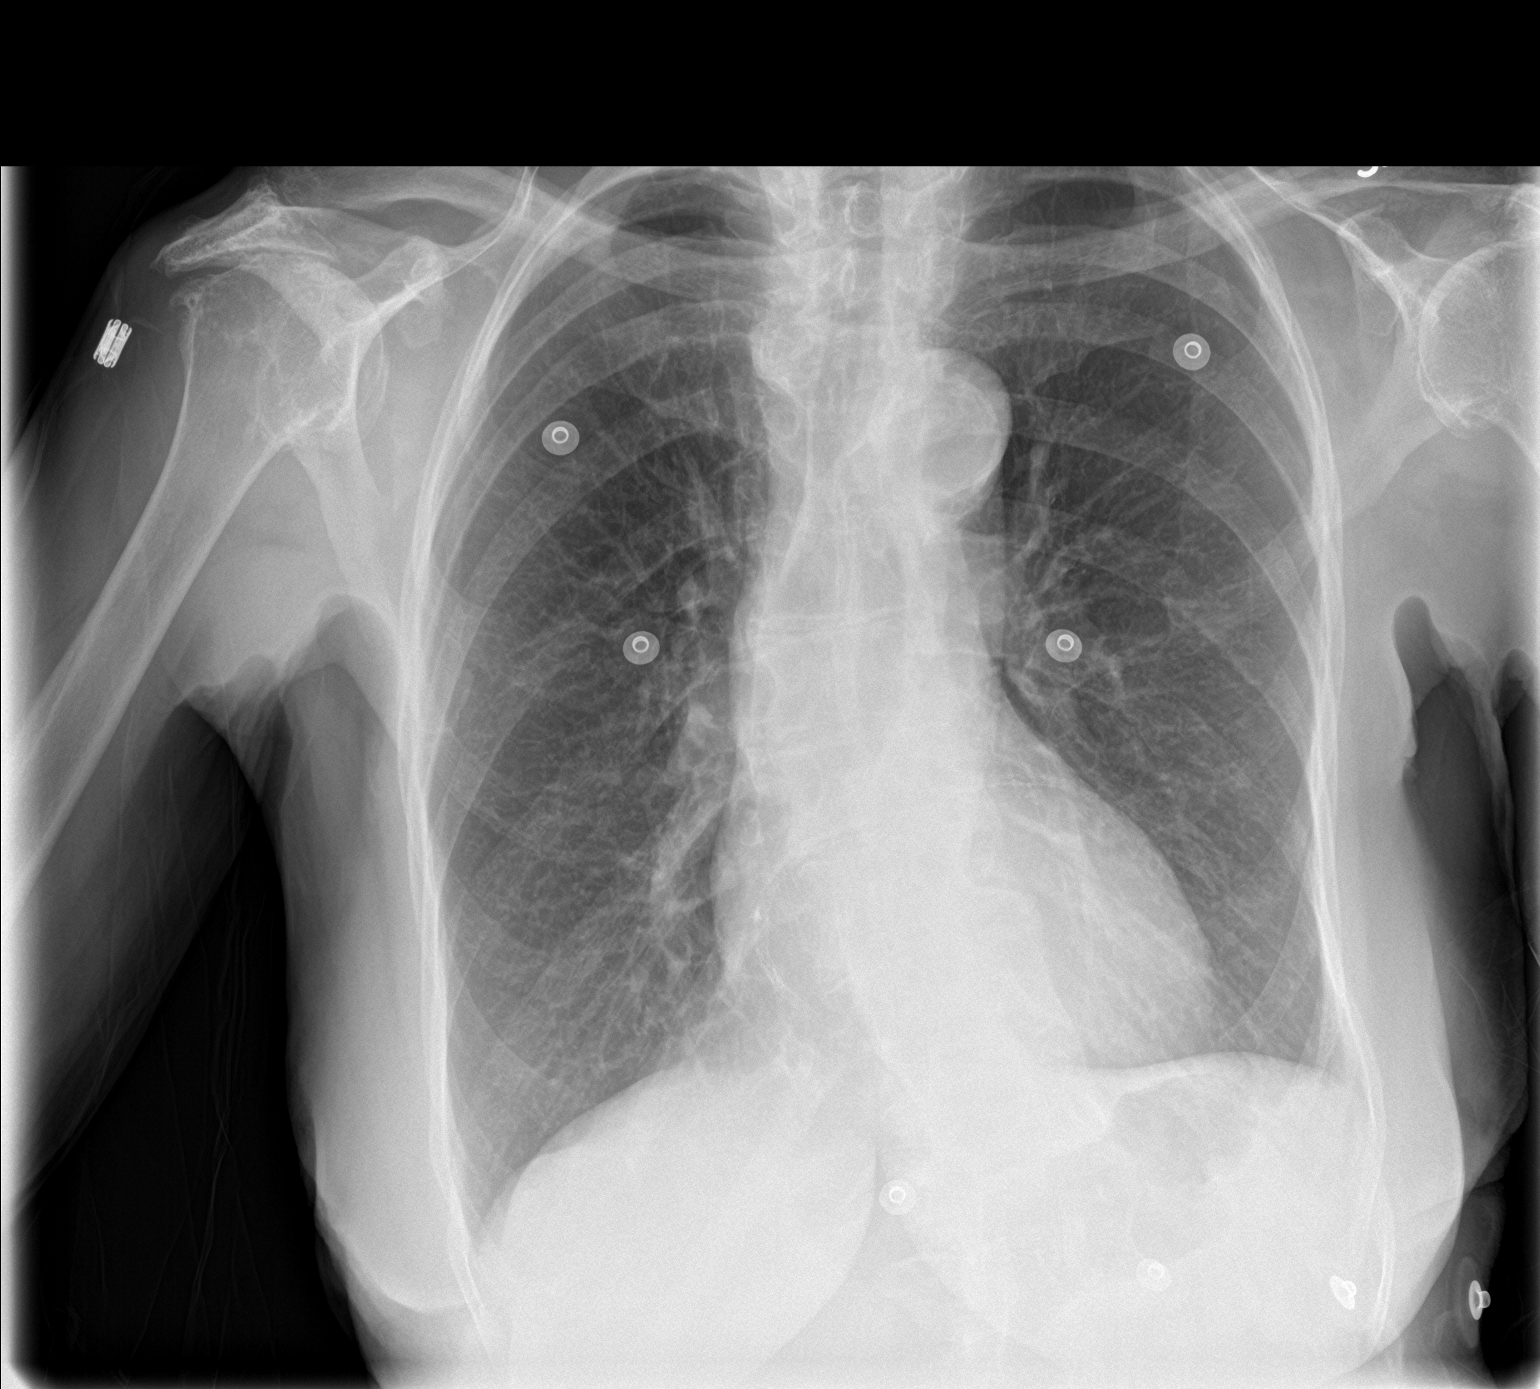

[chest lat]
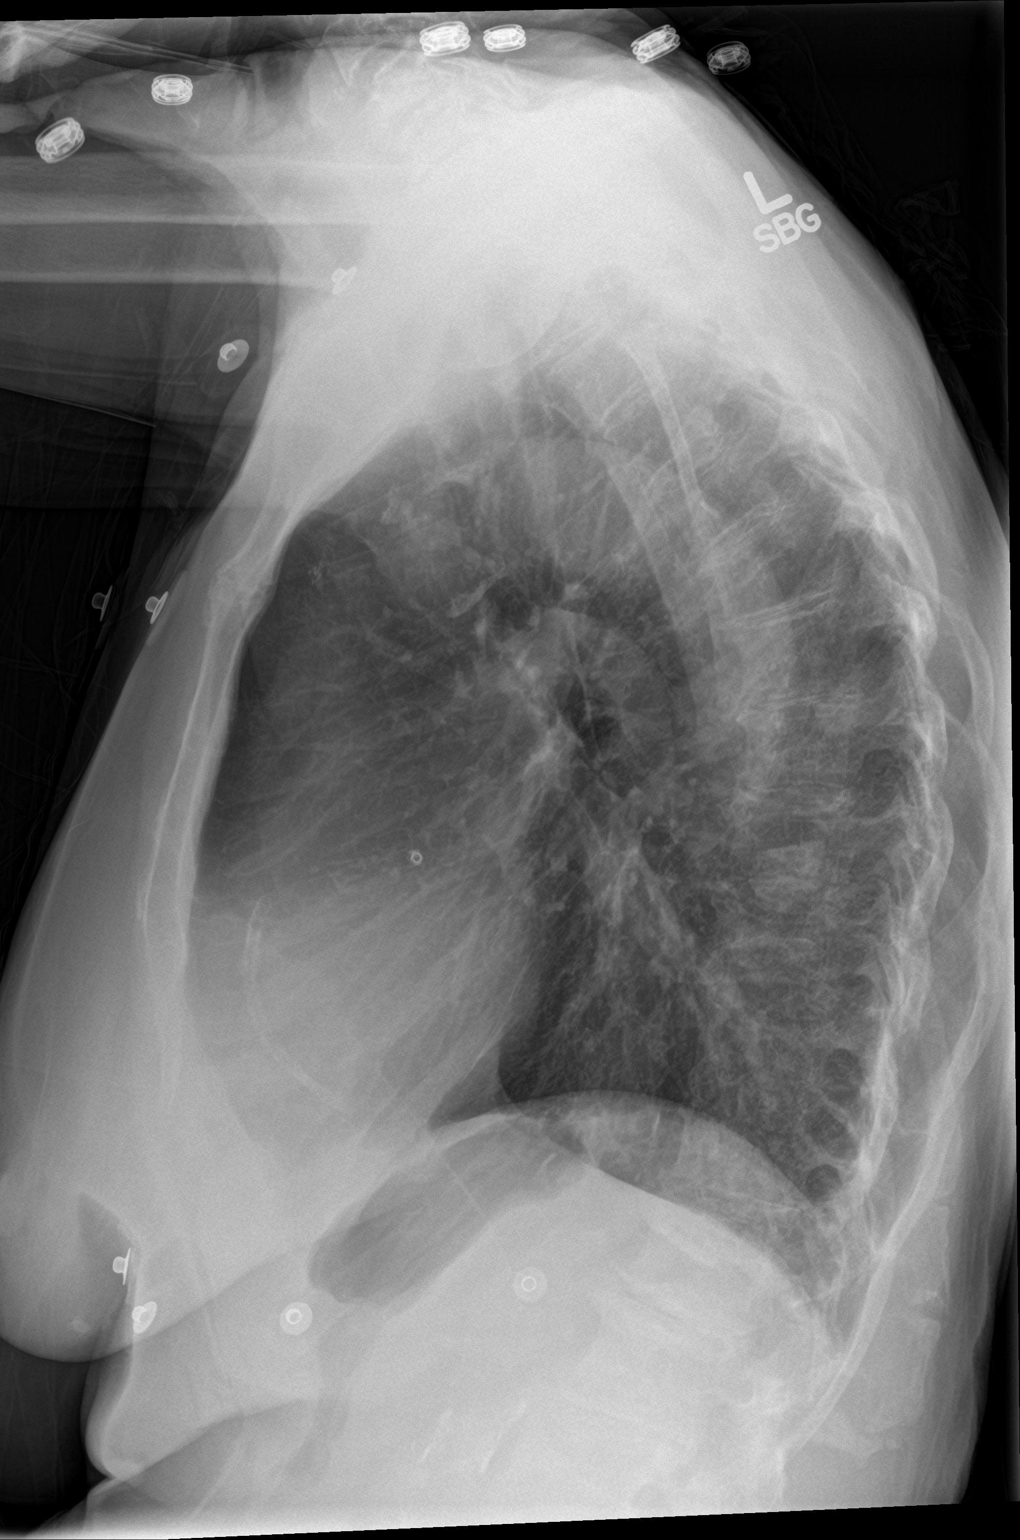

[2 of 2 positions shown; findings below may reference images not displayed]

FINDINGS: Stable cardiomediastinal silhouette with normal heart size and
aortic atherosclerosis. No pneumothorax. No pleural effusion. Lungs
appear clear, with no acute consolidative airspace disease and no
pulmonary edema. Coronary stent overlies the heart.
IMPRESSION: No active cardiopulmonary disease.

Aortic atherosclerosis.

## 2017-04-06 IMAGING — MR MR 3D RECON AT SCANNER
19 of 20 series · 19 of 20 positions shown · IV contrast (multihance)
Comparison: CT on 06/23/2016

CLINICAL DATA: Elevated liver function tests. Pancreatic lesion
seen on recent CT.

EXAM:
MRI ABDOMEN WITHOUT AND WITH CONTRAST (INCLUDING MRCP)
TECHNIQUE: Multiplanar multisequence MR imaging of the abdomen was performed
both before and after the administration of intravenous contrast.
Heavily T2-weighted images of the biliary and pancreatic ducts were
obtained, and three-dimensional MRCP images were rendered by post
processing.
CONTRAST:  9mL MULTIHANCE GADOBENATE DIMEGLUMINE 529 MG/ML IV SOLN

[Series 3: T2 · coronal · 5.0mm · 0.82mm/px · 1 of 37 slices shown (1 of 2)]
[im 1/37]
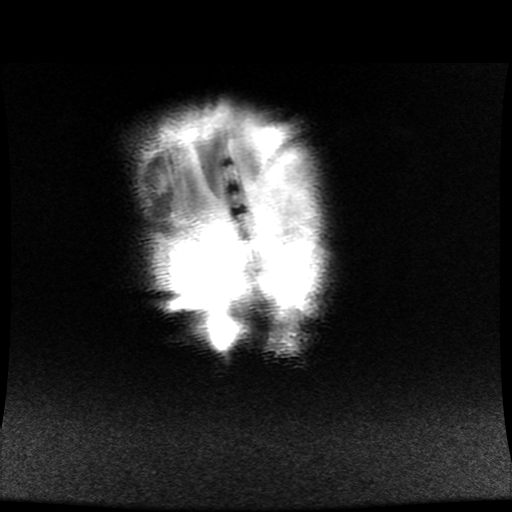

[Series 4: T2 fat-sat · axial · 5.0mm · 0.74mm/px · 1 of 37 slices shown]
[im 1/37]
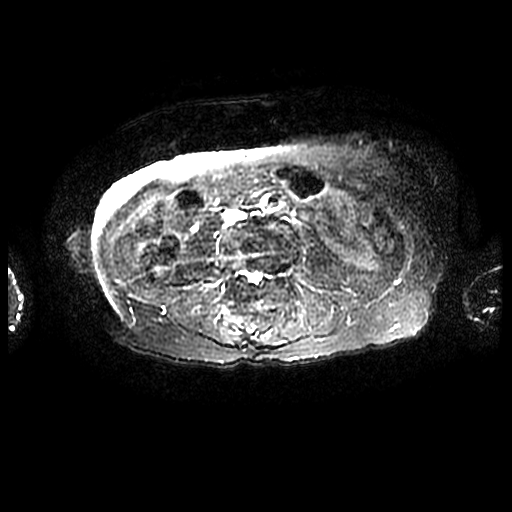

[Series 5: T2 · axial · 5.0mm · 0.74mm/px · 1 of 37 slices shown (2 of 2)]
[im 1/37]
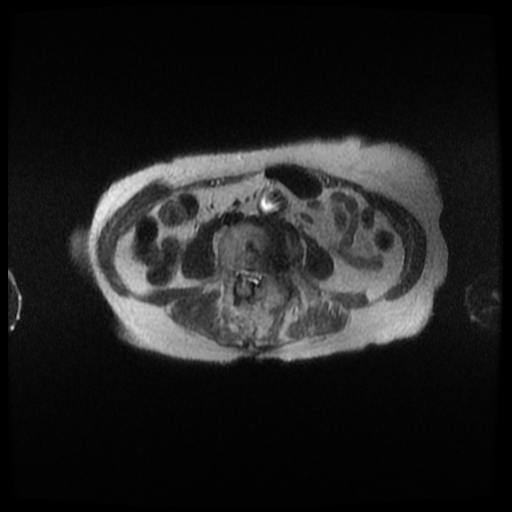

[Series 6: MRCP · coronal · 1.6mm · 0.62mm/px · 1 of 96 slices shown (1 of 3)]
[im 1/96]
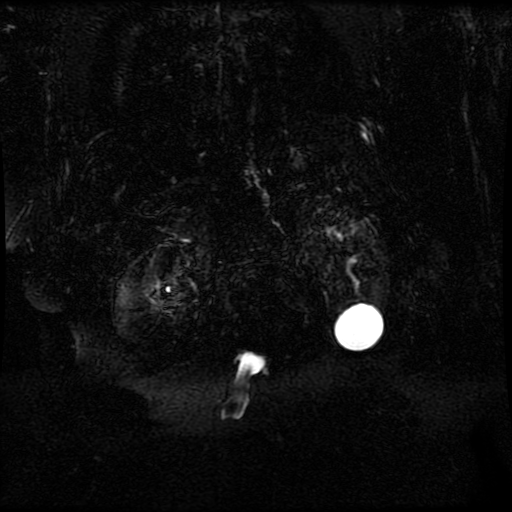

[Series 7: MRCP · coronal · 2.0mm · 0.70mm/px · 1 of 38 slices shown (2 of 3)]
[im 1/38]
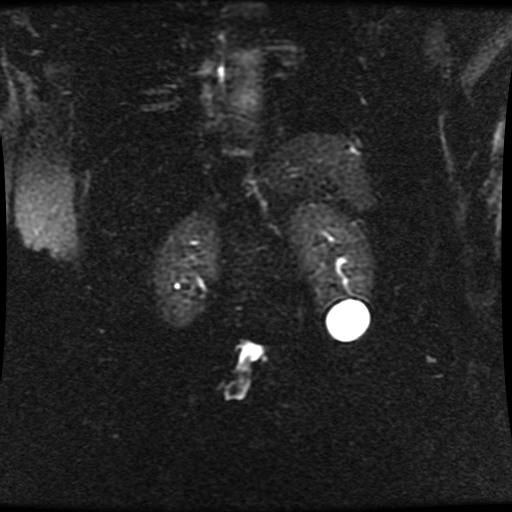

[Series 8: DWI b500 · axial · 6.0mm · 1.48mm/px · 1 of 57 slices shown]
[im 1/57]
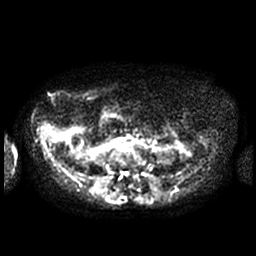

[Series 11: ax dualecho · axial · 5.0mm · 0.74mm/px · 1 of 76 slices shown]
[im 1/76]
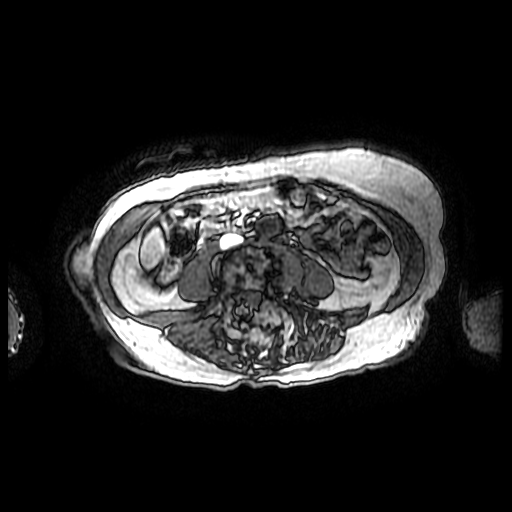

[Series 14: MRCP · coronal · 50.0mm · 0.70mm/px · 1 of 5 slices shown (3 of 3)]
[im 1/5]
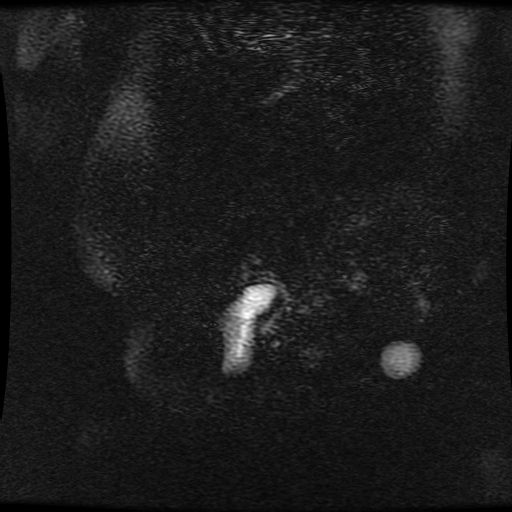

[Series 17: T1 dynamic post-contrast · coronal · 5.0mm · 0.78mm/px · 1 of 72 slices shown]
[im 1/72]
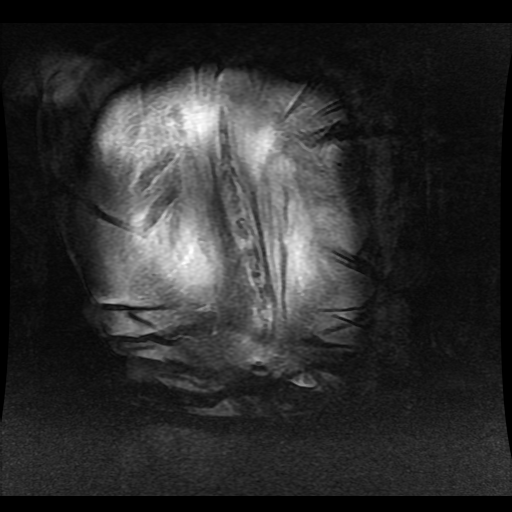

[Series 800: DWI · axial · 6.0mm · 1.48mm/px · 1 of 29 slices shown]
[im 1/29]
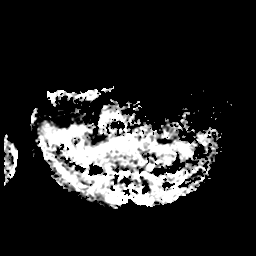

[Series 1600: T1 dynamic · axial · 5.0mm · 0.74mm/px · 1 of 80 slices shown (1 of 5)]
[im 1/80]
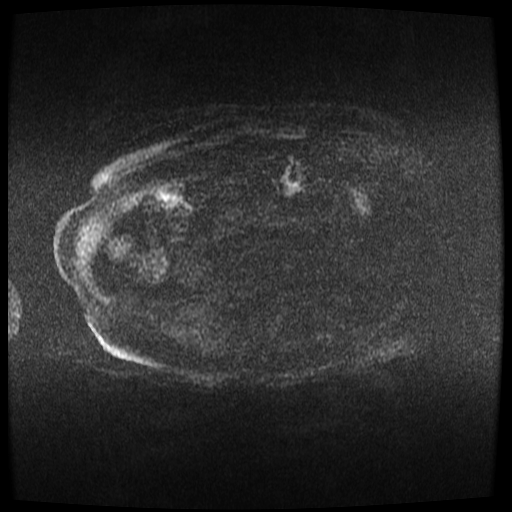

[Series 1601: T1 dynamic · axial · 5.0mm · 0.74mm/px · 1 of 80 slices shown (2 of 5)]
[im 1/80]
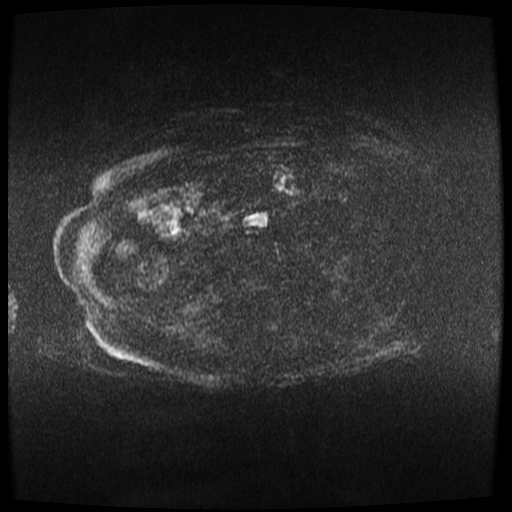

[Series 1602: T1 dynamic · axial · 5.0mm · 0.74mm/px · 1 of 80 slices shown (3 of 5)]
[im 1/80]
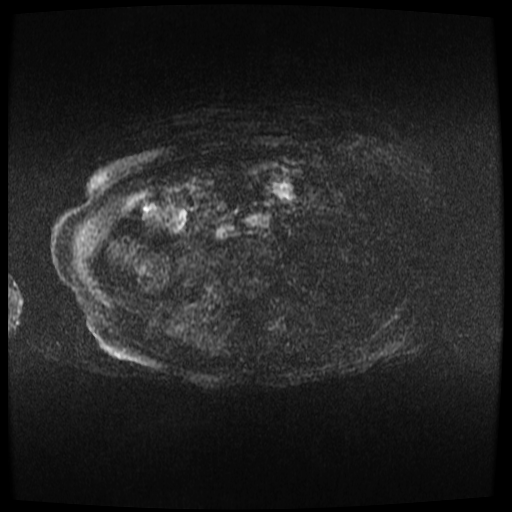

[Series 1603: T1 dynamic · axial · 5.0mm · 0.74mm/px · 1 of 80 slices shown (4 of 5)]
[im 1/80]
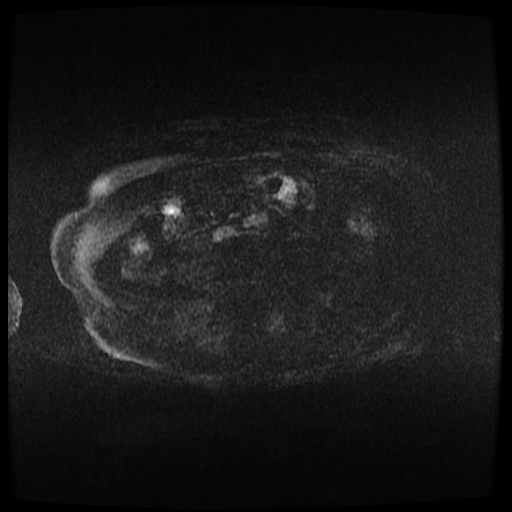

[Series 1604: T1 dynamic · axial · 5.0mm · 0.74mm/px · 1 of 80 slices shown (5 of 5)]
[im 1/80]
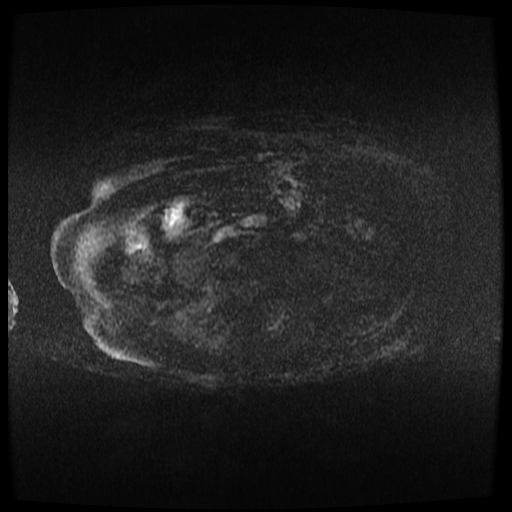

[((id)/(id)/1)-((id)/(id)/1) · axial · 5.0mm · 0.74mm/px · 1 of 78 slices shown (1 of 4)]
[im 1/78]
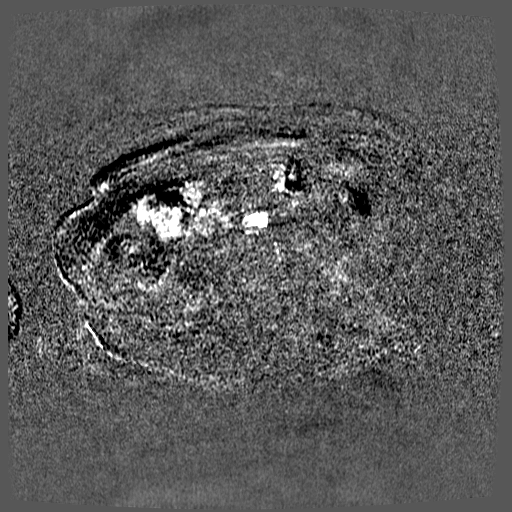

[((id)/(id)/1)-((id)/(id)/1) · axial · 5.0mm · 0.74mm/px · 1 of 80 slices shown (2 of 4)]
[im 1/80]
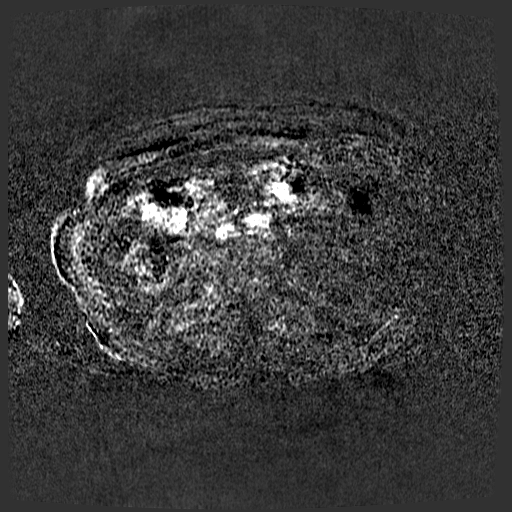

[((id)/(id)/1)-((id)/(id)/1) · axial · 5.0mm · 0.74mm/px · 1 of 80 slices shown (3 of 4)]
[im 1/80]
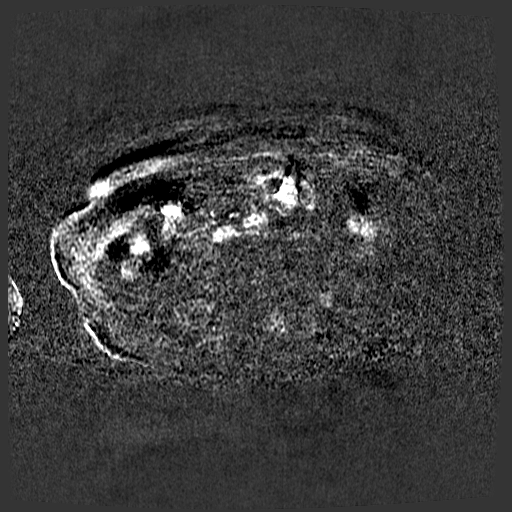

[((id)/(id)/1)-((id)/(id)/1) · axial · 5.0mm · 0.74mm/px · 1 of 80 slices shown (4 of 4)]
[im 1/80]
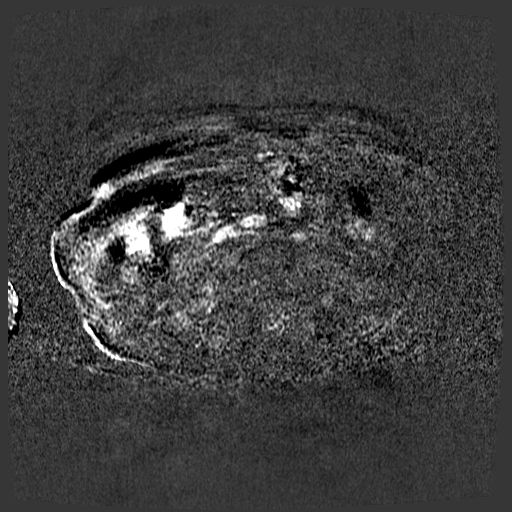

[19 of 20 positions shown; findings below may reference images not displayed]

FINDINGS: Lower chest: No acute findings.

Hepatobiliary: No masses identified. A few tiny scattered sub-cm
cysts are noted. Gallbladder is unremarkable. No evidence of biliary
ductal dilatation or choledocholithiasis.

Pancreas: Image degradation due to motion artifact . Multiple tiny
cystic lesions are seen within the pancreatic head and body,
majority measuring less than 1 cm in size. Largest cystic lesion in
the pancreatic head measures 1.5 cm on image [DATE]. These lesions
show no evidence of contrast enhancement or solid component. No
evidence of main pancreatic ductal dilatation or pancreas divisum.
No evidence of peripancreatic inflammatory changes.

Spleen:  Within normal limits in size and appearance.

Adrenals/Urinary Tract: No masses identified. 3 cm simple cyst in
lower pole of left kidney. No evidence of hydronephrosis.

Stomach/Bowel: Visualized portions within the abdomen are
unremarkable.

Vascular/Lymphatic: No pathologically enlarged lymph nodes
identified. No abdominal aortic aneurysm.

Other:  None.

Musculoskeletal: No suspicious bone lesions identified.
Thoracolumbar spondylosis and levoscoliosis.
IMPRESSION: Multiple tiny cystic lesions throughout the pancreas, with dominant
lesion in the pancreatic head measuring 1.5 cm. These likely
represent indolent cystic pancreatic neoplasm such as IPMNs. No
evidence of main pancreatic ductal dilatation. Recommend continued
followup with abdomen MRI without and with contrast in 2 years. This
recommendation follows ACR consensus guidelines: Management of
Incidental Pancreatic Cysts: A White Paper of the ACR Incidental
Findings Committee. [HOSPITAL] 7230;[DATE].

No significant hepatobiliary abnormality identified.

## 2017-05-20 DIAGNOSIS — L03011 Cellulitis of right finger: Secondary | ICD-10-CM | POA: Diagnosis not present

## 2017-05-23 ENCOUNTER — Encounter (HOSPITAL_COMMUNITY): Payer: Self-pay

## 2017-05-23 ENCOUNTER — Other Ambulatory Visit: Payer: Self-pay

## 2017-05-23 ENCOUNTER — Emergency Department (HOSPITAL_COMMUNITY): Payer: Medicare Other

## 2017-05-23 ENCOUNTER — Inpatient Hospital Stay (HOSPITAL_COMMUNITY)
Admission: EM | Admit: 2017-05-23 | Discharge: 2017-05-25 | DRG: 513 | Disposition: A | Discharge status: Home / Self Care | Payer: Medicare Other | Attending: Oncology | Admitting: Oncology

## 2017-05-23 DIAGNOSIS — I252 Old myocardial infarction: Secondary | ICD-10-CM

## 2017-05-23 DIAGNOSIS — Z888 Allergy status to other drugs, medicaments and biological substances status: Secondary | ICD-10-CM

## 2017-05-23 DIAGNOSIS — I10 Essential (primary) hypertension: Secondary | ICD-10-CM | POA: Diagnosis not present

## 2017-05-23 DIAGNOSIS — Z955 Presence of coronary angioplasty implant and graft: Secondary | ICD-10-CM

## 2017-05-23 DIAGNOSIS — L03011 Cellulitis of right finger: Secondary | ICD-10-CM | POA: Diagnosis not present

## 2017-05-23 DIAGNOSIS — N179 Acute kidney failure, unspecified: Secondary | ICD-10-CM | POA: Diagnosis not present

## 2017-05-23 DIAGNOSIS — E785 Hyperlipidemia, unspecified: Secondary | ICD-10-CM | POA: Diagnosis not present

## 2017-05-23 DIAGNOSIS — I25119 Atherosclerotic heart disease of native coronary artery with unspecified angina pectoris: Secondary | ICD-10-CM | POA: Diagnosis present

## 2017-05-23 DIAGNOSIS — M810 Age-related osteoporosis without current pathological fracture: Secondary | ICD-10-CM | POA: Diagnosis present

## 2017-05-23 DIAGNOSIS — I5032 Chronic diastolic (congestive) heart failure: Secondary | ICD-10-CM | POA: Diagnosis present

## 2017-05-23 DIAGNOSIS — L03012 Cellulitis of left finger: Secondary | ICD-10-CM | POA: Diagnosis not present

## 2017-05-23 DIAGNOSIS — Z7982 Long term (current) use of aspirin: Secondary | ICD-10-CM | POA: Diagnosis not present

## 2017-05-23 DIAGNOSIS — Z8673 Personal history of transient ischemic attack (TIA), and cerebral infarction without residual deficits: Secondary | ICD-10-CM

## 2017-05-23 DIAGNOSIS — Z9842 Cataract extraction status, left eye: Secondary | ICD-10-CM

## 2017-05-23 DIAGNOSIS — M170 Bilateral primary osteoarthritis of knee: Secondary | ICD-10-CM | POA: Diagnosis present

## 2017-05-23 DIAGNOSIS — Z91013 Allergy to seafood: Secondary | ICD-10-CM

## 2017-05-23 DIAGNOSIS — M86142 Other acute osteomyelitis, left hand: Principal | ICD-10-CM | POA: Diagnosis present

## 2017-05-23 DIAGNOSIS — Z9841 Cataract extraction status, right eye: Secondary | ICD-10-CM | POA: Diagnosis not present

## 2017-05-23 DIAGNOSIS — I2510 Atherosclerotic heart disease of native coronary artery without angina pectoris: Secondary | ICD-10-CM | POA: Diagnosis present

## 2017-05-23 DIAGNOSIS — M8628 Subacute osteomyelitis, other site: Secondary | ICD-10-CM | POA: Diagnosis not present

## 2017-05-23 DIAGNOSIS — I739 Peripheral vascular disease, unspecified: Secondary | ICD-10-CM | POA: Diagnosis not present

## 2017-05-23 DIAGNOSIS — M79642 Pain in left hand: Secondary | ICD-10-CM | POA: Diagnosis not present

## 2017-05-23 DIAGNOSIS — Z823 Family history of stroke: Secondary | ICD-10-CM

## 2017-05-23 DIAGNOSIS — Z79899 Other long term (current) drug therapy: Secondary | ICD-10-CM

## 2017-05-23 DIAGNOSIS — S62633A Displaced fracture of distal phalanx of left middle finger, initial encounter for closed fracture: Secondary | ICD-10-CM | POA: Diagnosis not present

## 2017-05-23 DIAGNOSIS — Z89022 Acquired absence of left finger(s): Secondary | ICD-10-CM | POA: Diagnosis not present

## 2017-05-23 DIAGNOSIS — I701 Atherosclerosis of renal artery: Secondary | ICD-10-CM | POA: Diagnosis present

## 2017-05-23 DIAGNOSIS — B958 Unspecified staphylococcus as the cause of diseases classified elsewhere: Secondary | ICD-10-CM | POA: Diagnosis present

## 2017-05-23 DIAGNOSIS — E039 Hypothyroidism, unspecified: Secondary | ICD-10-CM | POA: Diagnosis not present

## 2017-05-23 DIAGNOSIS — I110 Hypertensive heart disease with heart failure: Secondary | ICD-10-CM | POA: Diagnosis present

## 2017-05-23 DIAGNOSIS — K219 Gastro-esophageal reflux disease without esophagitis: Secondary | ICD-10-CM | POA: Diagnosis present

## 2017-05-23 DIAGNOSIS — I25110 Atherosclerotic heart disease of native coronary artery with unstable angina pectoris: Secondary | ICD-10-CM | POA: Diagnosis not present

## 2017-05-23 DIAGNOSIS — M19041 Primary osteoarthritis, right hand: Secondary | ICD-10-CM | POA: Diagnosis present

## 2017-05-23 DIAGNOSIS — R509 Fever, unspecified: Secondary | ICD-10-CM | POA: Diagnosis not present

## 2017-05-23 DIAGNOSIS — Z8672 Personal history of thrombophlebitis: Secondary | ICD-10-CM | POA: Diagnosis not present

## 2017-05-23 DIAGNOSIS — Z880 Allergy status to penicillin: Secondary | ICD-10-CM

## 2017-05-23 DIAGNOSIS — M7989 Other specified soft tissue disorders: Secondary | ICD-10-CM | POA: Diagnosis not present

## 2017-05-23 DIAGNOSIS — B9689 Other specified bacterial agents as the cause of diseases classified elsewhere: Secondary | ICD-10-CM | POA: Diagnosis not present

## 2017-05-23 DIAGNOSIS — L089 Local infection of the skin and subcutaneous tissue, unspecified: Secondary | ICD-10-CM

## 2017-05-23 DIAGNOSIS — X58XXXA Exposure to other specified factors, initial encounter: Secondary | ICD-10-CM | POA: Diagnosis not present

## 2017-05-23 DIAGNOSIS — Z8249 Family history of ischemic heart disease and other diseases of the circulatory system: Secondary | ICD-10-CM

## 2017-05-23 DIAGNOSIS — M869 Osteomyelitis, unspecified: Secondary | ICD-10-CM | POA: Diagnosis not present

## 2017-05-23 DIAGNOSIS — M19042 Primary osteoarthritis, left hand: Secondary | ICD-10-CM | POA: Diagnosis present

## 2017-05-23 DIAGNOSIS — R238 Other skin changes: Secondary | ICD-10-CM | POA: Diagnosis not present

## 2017-05-23 DIAGNOSIS — I5030 Unspecified diastolic (congestive) heart failure: Secondary | ICD-10-CM | POA: Diagnosis not present

## 2017-05-23 HISTORY — DX: Displaced fracture of distal phalanx of left middle finger, initial encounter for closed fracture: S62.633A

## 2017-05-23 LAB — COMPREHENSIVE METABOLIC PANEL
ALBUMIN: 3.2 g/dL — AB (ref 3.5–5.0)
ALT: 16 U/L (ref 14–54)
ANION GAP: 11 (ref 5–15)
AST: 27 U/L (ref 15–41)
Alkaline Phosphatase: 91 U/L (ref 38–126)
BUN: 26 mg/dL — AB (ref 6–20)
CHLORIDE: 109 mmol/L (ref 101–111)
CO2: 21 mmol/L — ABNORMAL LOW (ref 22–32)
Calcium: 11.4 mg/dL — ABNORMAL HIGH (ref 8.9–10.3)
Creatinine, Ser: 1.35 mg/dL — ABNORMAL HIGH (ref 0.44–1.00)
GFR calc Af Amer: 39 mL/min — ABNORMAL LOW (ref >60)
GFR, EST NON AFRICAN AMERICAN: 34 mL/min — AB (ref >60)
Glucose, Bld: 166 mg/dL — ABNORMAL HIGH (ref 65–99)
POTASSIUM: 4 mmol/L (ref 3.5–5.1)
Sodium: 141 mmol/L (ref 135–145)
TOTAL PROTEIN: 7.3 g/dL (ref 6.5–8.1)
Total Bilirubin: 0.5 mg/dL (ref 0.3–1.2)

## 2017-05-23 LAB — CBC WITH DIFFERENTIAL/PLATELET
BASOS ABS: 0 10*3/uL (ref 0.0–0.1)
Basophils Relative: 0 %
EOS PCT: 1 %
Eosinophils Absolute: 0.1 10*3/uL (ref 0.0–0.7)
HEMATOCRIT: 31 % — AB (ref 36.0–46.0)
HEMOGLOBIN: 9.8 g/dL — AB (ref 12.0–15.0)
LYMPHS ABS: 1.1 10*3/uL (ref 0.7–4.0)
LYMPHS PCT: 12 %
MCH: 31 pg (ref 26.0–34.0)
MCHC: 31.6 g/dL (ref 30.0–36.0)
MCV: 98.1 fL (ref 78.0–100.0)
Monocytes Absolute: 0.4 10*3/uL (ref 0.1–1.0)
Monocytes Relative: 5 %
NEUTROS ABS: 7.7 10*3/uL (ref 1.7–7.7)
Neutrophils Relative %: 82 %
Platelets: 185 10*3/uL (ref 150–400)
RBC: 3.16 MIL/uL — AB (ref 3.87–5.11)
RDW: 13.7 % (ref 11.5–15.5)
WBC: 9.4 10*3/uL (ref 4.0–10.5)

## 2017-05-23 LAB — I-STAT CG4 LACTIC ACID, ED: LACTIC ACID, VENOUS: 1.63 mmol/L (ref 0.5–1.9)

## 2017-05-23 LAB — PHOSPHORUS: Phosphorus: 2.1 mg/dL — ABNORMAL LOW (ref 2.5–4.6)

## 2017-05-23 MED ORDER — ACETAMINOPHEN 650 MG RE SUPP: 650.0000 mg | Freq: Four times a day (QID) | RECTAL | Status: DC | PRN

## 2017-05-23 MED ORDER — VANCOMYCIN HCL 500 MG IV SOLR: 500.0000 mg | INTRAVENOUS | Status: DC

## 2017-05-23 MED ORDER — DEXTROSE 5 % IV SOLN
1.0000 g | INTRAVENOUS | Status: DC
  Administered 2017-05-23 – 2017-05-24 (×2): 1 g via INTRAVENOUS
  Filled 2017-05-23 (×3): qty 10

## 2017-05-23 MED ORDER — ACETAMINOPHEN 325 MG PO TABS
650.0000 mg | ORAL_TABLET | Freq: Four times a day (QID) | ORAL | Status: DC | PRN
Start: 2017-05-23 — End: 2017-05-25

## 2017-05-23 MED ORDER — TRAMADOL HCL 50 MG PO TABS: 50.0000 mg | ORAL_TABLET | Freq: Two times a day (BID) | ORAL | Status: DC | PRN

## 2017-05-23 MED ORDER — METOPROLOL SUCCINATE ER 50 MG PO TB24
75.0000 mg | ORAL_TABLET | Freq: Every day | ORAL | Status: DC
  Administered 2017-05-24: 75 mg via ORAL
  Filled 2017-05-23 (×2): qty 1

## 2017-05-23 MED ORDER — SODIUM CHLORIDE 0.9% FLUSH
3.0000 mL | Freq: Two times a day (BID) | INTRAVENOUS | Status: DC
  Administered 2017-05-24 – 2017-05-25 (×4): 3 mL via INTRAVENOUS

## 2017-05-23 MED ORDER — ASPIRIN EC 81 MG PO TBEC
81.0000 mg | DELAYED_RELEASE_TABLET | Freq: Every day | ORAL | Status: DC
  Administered 2017-05-24 – 2017-05-25 (×2): 81 mg via ORAL
  Filled 2017-05-23 (×2): qty 1

## 2017-05-23 MED ORDER — SODIUM CHLORIDE 0.9 % IV SOLN: INTRAVENOUS | Status: DC

## 2017-05-23 MED ORDER — VANCOMYCIN HCL IN DEXTROSE 1-5 GM/200ML-% IV SOLN
1000.0000 mg | Freq: Once | INTRAVENOUS | Status: AC
  Administered 2017-05-23: 1000 mg via INTRAVENOUS
  Filled 2017-05-23 (×2): qty 200

## 2017-05-23 MED ORDER — PANTOPRAZOLE SODIUM 40 MG PO TBEC
40.0000 mg | DELAYED_RELEASE_TABLET | Freq: Every day | ORAL | Status: DC
  Administered 2017-05-24 – 2017-05-25 (×2): 40 mg via ORAL
  Filled 2017-05-23 (×2): qty 1

## 2017-05-23 NOTE — ED Triage Notes (Signed)
Per pt and family, pt is coming from PCP where she ahs been being evaluated for cellulitis of the finger. The middle finger on the left hand has become swollen and change in color. Denies fevers.

## 2017-05-23 NOTE — Progress Notes (Signed)
RN notified MD of patients arrival to [facility] from [facility].

## 2017-05-23 NOTE — Consult Note (Signed)
Reason for Consult:finger infection Referring Physician: ER  CC:My finger hurts  HPI:  [name] is an 84 y.o. right handed female who presents with  pain and swelling of finger .  ? How long this process has been going on, apparently she has received po clindamycin by primary MD, referred here for worsening infection, some hand and erythema up arm. Pain is rated at   7 /10 and is described as sharp.  Pain is constant.  Pain is made better by rest/immobilization, worse with motion.   Associated signs/symptoms: Previous treatment:    Past Medical History:  Diagnosis Date  . Abscess 2003   I&D SCM abscess  . Acute ischemic colitis ([place]) 04/10/11   acute episode of ischemic colitis, with abd pain, CT evidence of transverse colitis, and blood streaked BM.  Resolved with conservative treatment.  . Arthritis    "hands, joints, knees" (07/19/2016)  . Bacteremia 2003   hx of staph bacteremia  . CAD (coronary artery disease) 2003, 2010   s/p stenting of RCA x2, s/p stent to OM;  LHC 04/2009 in the setting of a NSTEMI: EF 65%, RCA stent patent with 60% ISR proximal and 40% ISR mid, PLV proximal 50-60% and distal 50%, ostial circumflex 30-40%, proximal OM mild in-stent restenosis with a branch with 90+ percent ostial stenosis, proximal LAD 30-40%.  FFR of ostial circumflex:  0.96 - not flow limiting.  Medical therapy was continued.  . Chronic lower back pain    "not a big problem" (07/19/2016)  . Degenerative disc disease   . GERD (gastroesophageal reflux disease)   . History of medication noncompliance    concerning Plavix  . HTN (hypertension)    Echocardiogram 08/26/11: Mild focal basal septal hypertrophy, EF 24-40%, grade 2 diastolic dysfunction, mild MR.  There was a fluid-filled area likely in the location of the liver noted  . Hyperlipidemia   . Hyperparathyroidism 2003   parathyroid adenoma localized to the right inferior thyroid lobe region  . Hypothyroidism   . Left renal artery  stenosis (HCC)    40%  . Lumbar stenosis with neurogenic claudication 2006   s/p decompression L2-L5  . Myocardial infarction [facility]) ~ 2013; 05/06/2016  . Osteoporosis 2009   diagnosed by bone density scan in 2009, on Vit D only, no bisphosphonate therapy  . Phlebitis    LLE  . PVD (peripheral vascular disease) (HCC)    s/p left fem-pop bypass graft  . TIA (transient ischemic attack)    carotid dopplers 5/13: no ICA stenosis    Past Surgical History:  Procedure Laterality Date  . CARDIAC CATHETERIZATION N/A 05/06/2016   Procedure: Left Heart Cath and Coronary Angiography;  Surgeon: Lorretta Harp, MD;  Location: [place] [facility];  Service: Cardiovascular;  Laterality: N/A;  . CARDIAC CATHETERIZATION N/A 05/06/2016   Procedure: Coronary Stent Intervention;  Surgeon: Lorretta Harp, MD;  Location: [place] [facility];  Service: Cardiovascular;  Laterality: N/A;  RCA  . CATARACT EXTRACTION, BILATERAL Bilateral   . CORONARY ANGIOPLASTY WITH STENT PLACEMENT  09/2008  . CORONARY ANGIOPLASTY WITH STENT PLACEMENT  04/2001  . ENDOSCOPIC HEMILAMINOTOMY W/ DISCECTOMY LUMBAR  06/2004   L3, L4 lami; L2, L5 hemilami; decompresion L2-3, 3-4, 4-  . FEMORAL BYPASS  before 2003   LLE  . INCISION AND DRAINAGE ANTERIOR NECK  10/2001   absces sternoclavicular joint    Family History  Problem Relation Age of Onset  . Stroke Mother   . Stroke  Father     Social History:  reports that  has never smoked. she has never used smokeless tobacco. She reports that she does not drink alcohol or use drugs.  Allergies:  Allergies  Allergen Reactions  . Diphenhydramine Hcl Other (See Comments)    unknown  . Penicillins Other (See Comments)    unknown  . Shellfish Allergy Other (See Comments)    unknown    Medications: I have reviewed the patient's current medications.  Results for orders placed or performed during the hospital encounter of 05/23/17 (from the past 48 hour(s))  Comprehensive metabolic  panel     Status: Abnormal   Collection Time: 05/23/17 12:48 PM  Result Value Ref Range   Sodium 141 135 - 145 mmol/L   Potassium 4.0 3.5 - 5.1 mmol/L   Chloride 109 101 - 111 mmol/L   CO2 21 (L) 22 - 32 mmol/L   Glucose, Bld 166 (H) 65 - 99 mg/dL   BUN 26 (H) 6 - 20 mg/dL   Creatinine, Ser 1.35 (H) 0.44 - 1.00 mg/dL   Calcium 11.4 (H) 8.9 - 10.3 mg/dL   Total Protein 7.3 6.5 - 8.1 g/dL   Albumin 3.2 (L) 3.5 - 5.0 g/dL   AST 27 15 - 41 U/L   ALT 16 14 - 54 U/L   Alkaline Phosphatase 91 38 - 126 U/L   Total Bilirubin 0.5 0.3 - 1.2 mg/dL   GFR calc non Af Amer 34 (L) >60 mL/min   GFR calc Af Amer 39 (L) >60 mL/min    Comment: (NOTE) The eGFR has been calculated using the CKD EPI equation. This calculation has not been validated in all clinical situations. eGFR's persistently <60 mL/min signify possible Chronic Kidney Disease.    Anion gap 11 5 - 15    Comment: Performed at [facility] [address] [place], [place] [address]  CBC with Differential     Status: Abnormal   Collection Time: 05/23/17 12:48 PM  Result Value Ref Range   WBC 9.4 4.0 - 10.5 K/uL   RBC 3.16 (L) 3.87 - 5.11 MIL/uL   Hemoglobin 9.8 (L) 12.0 - 15.0 g/dL   HCT 31.0 (L) 36.0 - 46.0 %   MCV 98.1 78.0 - 100.0 fL   MCH 31.0 26.0 - 34.0 pg   MCHC 31.6 30.0 - 36.0 g/dL   RDW 13.7 11.5 - 15.5 %   Platelets 185 150 - 400 K/uL   Neutrophils Relative % 82 %   Neutro Abs 7.7 1.7 - 7.7 K/uL   Lymphocytes Relative 12 %   Lymphs Abs 1.1 0.7 - 4.0 K/uL   Monocytes Relative 5 %   Monocytes Absolute 0.4 0.1 - 1.0 K/uL   Eosinophils Relative 1 %   Eosinophils Absolute 0.1 0.0 - 0.7 K/uL   Basophils Relative 0 %   Basophils Absolute 0.0 0.0 - 0.1 K/uL    Comment: Performed at [facility] [address] [place], [place] [address]  I-Stat CG4 Lactic Acid, ED     Status: None   Collection Time: 05/23/17  1:08 PM  Result Value Ref Range   Lactic Acid, Venous 1.63 0.5 - 1.9 mmol/L    Dg Hand  Complete Left  Result Date: 05/23/2017 CLINICAL DATA:  Third digit pain and swelling EXAM: LEFT HAND - COMPLETE 3+ VIEW COMPARISON:  08/26/2011 FINDINGS: Considerable soft tissue swelling in the third digit is noted. There are changes in the distal aspect of  the third distal phalanx suggestive of prior fracture with flexion of the DIP joint and displacement of the fracture fragments. No other focal abnormality is noted. IMPRESSION: Changes suggestive of fracture of the third distal phalanx with flexion of the DIP joint. Given the degree of soft tissue swelling the possibility of underlying osteomyelitis in the distal phalanx cannot be totally excluded. MRI would be helpful for further evaluation as clinically necessary. Electronically Signed   By: Inez Catalina M.D.   On: 05/23/2017 13:27    Pertinent items are noted in HPI. Temp:  [98.5 F (36.9 C)] 98.5 F (36.9 C) (02/08 1242) Pulse Rate:  [60-69] 60 (02/08 1745) Resp:  [16] 16 (02/08 1745) BP: (107-124)/(57-72) 107/66 (02/08 1745) SpO2:  [99 %-100 %] 99 % (02/08 1745) Weight:  [44.5 kg (98 lb)] 44.5 kg (98 lb) (02/08 1242) General appearance: alert and cooperative Resp: clear to auscultation bilaterally Cardio: regular rate and rhythm GI: soft, non-tender; bowel sounds normal; no masses,  no organomegaly Extremities: swollen, obvious infected finger extending from pipj distally LLF   Assessment: Infected Left long finger  Plan: Needs I&d I have discussed this treatment plan in detail with patient and  family, including the risks of the recommended treatment or surgery, the benefits and the alternatives.  The patient and/ caregiver understands that additional treatment may be necessary.  Salim Forero C Chanley Mcenery 05/23/2017, 7:14 PM

## 2017-05-23 NOTE — ED Provider Notes (Signed)
[facility] Provider Note   CSN: [id number] Arrival date & time: 05/23/17  1153     History   Chief Complaint Chief Complaint  Patient presents with  . Cellulitis    HPI [name] is a 84 y.o. female.  HPI  84 year old female presents from her PCPs office with a left hand infection.  Starts in her finger.  Started about 4 or 5 days ago and has progressed.  Started off as a small wound at her fingernail that has progressed into diffuse swelling.  She had some swelling in her hand as well and saw her PCP 2 days ago.  Was started on clindamycin and given a shot of antibiotic.  Now saw the PCP again for wound check today and the hand swelling is better but the finger looks worse and there is streaking going up her arm.  She denies any significant pain.  She has a history of a stent placed in January of last year and is on Brilinta.  Past Medical History:  Diagnosis Date  . Abscess 2003   I&D SCM abscess  . Acute ischemic colitis ([place]) 04/10/11   acute episode of ischemic colitis, with abd pain, CT evidence of transverse colitis, and blood streaked BM.  Resolved with conservative treatment.  . Arthritis    "hands, joints, knees" (07/19/2016)  . Bacteremia 2003   hx of staph bacteremia  . CAD (coronary artery disease) 2003, 2010   s/p stenting of RCA x2, s/p stent to OM;  LHC 04/2009 in the setting of a NSTEMI: EF 65%, RCA stent patent with 60% ISR proximal and 40% ISR mid, PLV proximal 50-60% and distal 50%, ostial circumflex 30-40%, proximal OM mild in-stent restenosis with a branch with 90+ percent ostial stenosis, proximal LAD 30-40%.  FFR of ostial circumflex:  0.96 - not flow limiting.  Medical therapy was continued.  . Chronic lower back pain    "not a big problem" (07/19/2016)  . Closed displaced fracture of distal phalanx of left middle finger 05/23/2017  . Degenerative disc disease   . GERD (gastroesophageal reflux disease)   . History of  medication noncompliance    concerning Plavix  . HTN (hypertension)    Echocardiogram 08/26/11: Mild focal basal septal hypertrophy, EF 87-86%, grade 2 diastolic dysfunction, mild MR.  There was a fluid-filled area likely in the location of the liver noted  . Hyperlipidemia   . Hyperparathyroidism 2003   parathyroid adenoma localized to the right inferior thyroid lobe region  . Hypothyroidism   . Left renal artery stenosis (HCC)    40%  . Lumbar stenosis with neurogenic claudication 2006   s/p decompression L2-L5  . Myocardial infarction [facility]) ~ 2013; 05/06/2016  . Osteoporosis 2009   diagnosed by bone density scan in 2009, on Vit D only, no bisphosphonate therapy  . Phlebitis    LLE  . PVD (peripheral vascular disease) (HCC)    s/p left fem-pop bypass graft  . TIA (transient ischemic attack)    carotid dopplers 5/13: no ICA stenosis    Patient Active Problem List   Diagnosis Date Noted  . Closed displaced fracture of distal phalanx of left middle finger 05/23/2017  . Chest pain at rest 07/21/2016  . Unstable angina ([place]) 07/19/2016  . Pancreatic mass   . Abnormal liver function test   . 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 History of TIA (transient ischemic attack)   . Depression 04/10/2011  .  Peripheral vascular disease ([place]) 04/11/2008  . Hyperlipidemia   . Hypertension   . CAD (coronary artery disease), native coronary artery     Past Surgical History:  Procedure Laterality Date  . CARDIAC CATHETERIZATION N/A 05/06/2016   Procedure: Left Heart Cath and Coronary Angiography;  Surgeon: Lorretta Harp, MD;  Location: [facility];  Service: Cardiovascular;  Laterality: N/A;  . CARDIAC CATHETERIZATION N/A 05/06/2016   Procedure: Coronary Stent Intervention;  Surgeon: Lorretta Harp, MD;  Location: [facility];  Service: Cardiovascular;  Laterality: N/A;  RCA  . CATARACT EXTRACTION, BILATERAL Bilateral   . CORONARY ANGIOPLASTY WITH STENT  PLACEMENT  09/2008  . CORONARY ANGIOPLASTY WITH STENT PLACEMENT  04/2001  . ENDOSCOPIC HEMILAMINOTOMY W/ DISCECTOMY LUMBAR  06/2004   L3, L4 lami; L2, L5 hemilami; decompresion L2-3, 3-4, 4-  . FEMORAL BYPASS  before 2003   LLE  . INCISION AND DRAINAGE ANTERIOR NECK  10/2001   absces sternoclavicular joint    OB History    No data available       Home Medications    Prior to Admission medications   Medication Sig Start Date End Date Taking? Authorizing Provider  amLODipine (NORVASC) 5 MG tablet TAKE 1 TABLET (5 MG TOTAL) BY MOUTH DAILY. 01/28/17  Yes Minus Breeding, MD  aspirin 81 MG tablet Take 1 tablet (81 mg total) by mouth daily. 08/21/12  Yes Hochrein, Jeneen Rinks, MD  bimatoprost (LUMIGAN) 0.01 % SOLN Place 1 drop into both eyes at bedtime.    Yes [provider]  brimonidine (ALPHAGAN) 0.15 % ophthalmic solution Place 1 drop into the right eye 2 (two) times daily. 04/16/11  Yes Hester Mates, MD  clindamycin (CLEOCIN) 300 MG capsule Take 300 mg by mouth 3 (three) times daily. 05/20/17  Yes [provider]  dorzolamide (TRUSOPT) 2 % ophthalmic solution Place 1 drop into both eyes 3 (three) times daily.   Yes [provider]  metoprolol succinate (TOPROL-XL) 25 MG 24 hr tablet TAKE 3 TABLETS (75 MG TOTAL) BY MOUTH DAILY. TAKE WITH OR IMMEDIATELY FOLLOWING A MEAL. 12/17/16  Yes Minus Breeding, MD  nitroGLYCERIN (NITROSTAT) 0.4 MG SL tablet Place 1 tablet (0.4 mg total) under the tongue every 5 (five) minutes as needed. For chest pain 07/12/16  Yes Hochrein, Jeneen Rinks, MD  pantoprazole (PROTONIX) 40 MG tablet TAKE 1 TABLET BY MOUTH EVERY DAY Patient taking differently: TAKE 40 mg TABLET BY MOUTH EVERY DAY 01/09/17  Yes Hochrein, Jeneen Rinks, MD  ramipril (ALTACE) 10 MG capsule TAKE ONE CAPSULE BY MOUTH EVERY DAY Patient taking differently: TAKE 10 mg CAPSULE BY MOUTH EVERY DAY 09/30/12  Yes Minus Breeding, MD  ticagrelor (BRILINTA) 90 MG TABS tablet Take 1 tablet (90 mg total) by  mouth 2 (two) times daily. 05/09/16  Yes Arbutus Leas, NP  timolol (BETIMOL) 0.5 % ophthalmic solution Place 1 drop into both eyes 2 (two) times daily.   Yes [provider]  traMADol (ULTRAM) 50 MG tablet Take 50 mg by mouth 2 (two) times daily as needed for moderate pain or severe pain.    Yes [provider]  acetaminophen (TYLENOL) 325 MG tablet Take 2 tablets (650 mg total) by mouth every 4 (four) hours as needed for headache or mild pain. Patient not taking: Reported on 05/23/2017 07/21/16   Isaiah Serge, NP  simvastatin (ZOCOR) 40 MG tablet Take 1 tablet (40 mg total) by mouth at bedtime. 04/16/11 06/20/11  Hester Mates, MD  Family History Family History  Problem Relation Age of Onset  . Stroke Mother   . Stroke Father     Social History Social History   Tobacco Use  . Smoking status: Never Smoker  . Smokeless tobacco: Never Used  Substance Use Topics  . Alcohol use: No  . Drug use: No     Allergies   Diphenhydramine hcl; Penicillins; and Shellfish allergy   Review of Systems Review of Systems  Constitutional: Positive for fever.  Musculoskeletal: Positive for arthralgias and joint swelling.  Skin: Positive for color change and wound.  Neurological: Negative for weakness and numbness.  All other systems reviewed and are negative.    Physical Exam Updated Vital Signs BP 107/66   Pulse 60   Temp 98.5 F (36.9 C) (Oral)   Resp 16   Ht 5\' 1"  (1.549 m)   Wt 44.5 kg (98 lb)   SpO2 99%   BMI 18.52 kg/m   Physical Exam  Constitutional: She is oriented to person, place, and time. She appears well-developed and well-nourished. No distress.  HENT:  Head: Normocephalic and atraumatic.  Right Ear: External ear normal.  Left Ear: External ear normal.  Nose: Nose normal.  Eyes: Right eye exhibits no discharge. Left eye exhibits no discharge.  Cardiovascular: Normal rate and regular rhythm.  Pulses:      Radial pulses are 2+ on the left side.    Pulmonary/Chest: Effort normal.  Abdominal: She exhibits no distension.  Musculoskeletal:  See picture below.  She has diffuse erythematous swelling of her left middle finger.  There is a blister with dark color change to the dorsal aspect.  No purulent drainage at this time.  There is streaking going from the finger dorsally up her forearm to the elbow.  There is no pain over the flexor tendon.  Neurological: She is alert and oriented to person, place, and time.  Skin: Skin is warm and dry. She is not diaphoretic.  Nursing note and vitals reviewed.          ED Treatments / Results  Labs (all labs ordered are listed, but only abnormal results are displayed) Labs Reviewed  COMPREHENSIVE METABOLIC PANEL - Abnormal; Notable for the following components:      Result Value   CO2 21 (*)    Glucose, Bld 166 (*)    BUN 26 (*)    Creatinine, Ser 1.35 (*)    Calcium 11.4 (*)    Albumin 3.2 (*)    GFR calc non Af Amer 34 (*)    GFR calc Af Amer 39 (*)    All other components within normal limits  CBC WITH DIFFERENTIAL/PLATELET - Abnormal; Notable for the following components:   RBC 3.16 (*)    Hemoglobin 9.8 (*)    HCT 31.0 (*)    All other components within normal limits  CULTURE, BLOOD (ROUTINE X 2)  CULTURE, BLOOD (ROUTINE X 2)  BODY FLUID CULTURE  I-STAT CG4 LACTIC ACID, ED    EKG  EKG Interpretation None       Radiology Dg Hand Complete Left  Result Date: 05/23/2017 CLINICAL DATA:  Third digit pain and swelling EXAM: LEFT HAND - COMPLETE 3+ VIEW COMPARISON:  08/26/2011 FINDINGS: Considerable soft tissue swelling in the third digit is noted. There are changes in the distal aspect of the third distal phalanx suggestive of prior fracture with flexion of the DIP joint and displacement of the fracture fragments. No other focal abnormality is noted. IMPRESSION:  Changes suggestive of fracture of the third distal phalanx with flexion of the DIP joint. Given the degree of soft  tissue swelling the possibility of underlying osteomyelitis in the distal phalanx cannot be totally excluded. MRI would be helpful for further evaluation as clinically necessary. Electronically Signed   By: Inez Catalina M.D.   On: 05/23/2017 13:27    Procedures Procedures (including critical care time)  Medications Ordered in ED Medications  cefTRIAXone (ROCEPHIN) 1 g in dextrose 5 % 50 mL IVPB (1 g Intravenous New Bag/Given 05/23/17 1925)  vancomycin (VANCOCIN) IVPB 1000 mg/200 mL premix (not administered)  vancomycin (VANCOCIN) 500 mg in sodium chloride 0.9 % 100 mL IVPB (not administered)     Initial Impression / Assessment and Plan / ED Course  I have reviewed the triage vital signs and the nursing notes.  Pertinent labs & imaging results that were available during my care of the patient were reviewed by me and considered in my medical decision making (see chart for details).     Patient is overall well appearing without signs/symptoms of sepsis. Her finger appears to have an infection/likely abscess. It is diffusely swollen but no pain/tenderness over tendon sheath. IV antibiotics, hand surgery consulted, will take to OR tomorrow. Internal medicine teaching service to admit.   Final Clinical Impressions(s) / ED Diagnoses   Final diagnoses:  Finger infection    ED Discharge Orders    None       Sherwood Gambler, MD 05/23/17 [phone]

## 2017-05-23 NOTE — Progress Notes (Signed)
Finger with blister and obvious fluid collection; skin opened, some liquid/non purulent fluid obtained and sent for culture.  Pt with non viable (epidermis) skin from dorsal pipj distally including all of finger tip - this skin and nail plate was removed.  Bacitracin placed over wound and wrapped.  ? Deeper infection  Will still plan OR in am, but if finger better may delay further surgical intervention.

## 2017-05-23 NOTE — Progress Notes (Signed)
Pharmacy Antibiotic Note  [name] is a 84 y.o. female admitted on 05/23/2017 with cellulitis.  Pharmacy has been consulted for ceftriaxone and vnacomycin dosing.  Plan: Start ceftriaxone 1g IV Q24h Give vancomycin 1g IV x 1, then start vancomycin 500mg  IV Q48h Monitor clinical picture, renal function, VT prn F/U C&S, abx deescalation / LOT  Height: 5\' 1"  (154.9 cm) Weight: 98 lb (44.5 kg) IBW/kg (Calculated) : 47.8  Temp (24hrs), Avg:98.5 F (36.9 C), Min:98.5 F (36.9 C), Max:98.5 F (36.9 C)  Recent Labs  Lab 05/23/17 1248 05/23/17 1308  WBC 9.4  --   CREATININE 1.35*  --   LATICACIDVEN  --  1.63    Estimated Creatinine Clearance: 19.8 mL/min (A) (by C-G formula based on SCr of 1.35 mg/dL (H)).    Allergies  Allergen Reactions  . Diphenhydramine Hcl Other (See Comments)    unknown  . Penicillins Other (See Comments)    unknown  . Shellfish Allergy Other (See Comments)    unknown    Thank you for allowing pharmacy to be a part of this patient's care.  Reginia Naas 05/23/2017 6:47 PM

## 2017-05-23 NOTE — ED Notes (Addendum)
Hand surgeon bedside for finger evaluation and marking

## 2017-05-23 NOTE — H&P (Signed)
Date: 05/23/2017               Patient Name:  [name] MRN: [id number]  DOB: November 05, 1925 Age / Sex: 84 y.o., female   PCP: Lillard Anes, MD         Medical Service: Internal Medicine Teaching Service         Attending Physician: Dr. Annia Belt, MD    First Contact: Dr. Trilby Drummer Pager: [phone]  Second Contact: Dr. Reesa Chew Pager: [phone]       After Hours (After 5p/  First Contact Pager: [phone]  weekends / holidays): Second Contact Pager: [phone]   Chief Complaint: finger swelling and pain  History of Present Illness: [name] is a very pleasant 84 yoF who presents to the for swelling, pain and discoloration of the middle finger of the left hand x 5 days. She has an extensive PMHx most notable for HTN, HLD, CAD s/p multiple stents last in 04/2016, PVD, TIA, and angina. She stated that she split her fingernail through the nail bed. She did not remove the nail but she did treat with hydrogen peroxide and saline soaks. Despite this attempted treatment the swelling and pain increased prompting her to visit her PCP who prescribed clindamycin, as well a shot of an unspecified antibiotic. When she followed up again two days later, it was noted that the swelling had improved but he erythema had worsened with newly declaring streaking up her left arm. She was adivsed to proceed to the ED for imaging and evaluation.  She denied any other symptoms including but not limited to nausea, vomiting, diarrhea, constipation, chest pain, visual changes, fever, chills, dysuria, myalgias, cough, weakness, or malaise. She atteted only the the finger swelling mild pain and erythema of the finger.   In the ED, xray of the finger indicated a displaced fracture of the distal third phalanx and significant soft tissue swelling. The orthopedic hand surgeon was consulted who performed a bedside procedure opening the large blister, removing the nail, sending cultures of the purulent material  and planning for full OR evaluation in the morning. CBC with WBC of 9.4, and stable normocytic anemia at 9.8. Lactate was unremarkable at 1.63. Blood Cx were sent.   Meds:  Current Meds  Medication Sig  . amLODipine (NORVASC) 5 MG tablet TAKE 1 TABLET (5 MG TOTAL) BY MOUTH DAILY.  Marland Kitchen aspirin 81 MG tablet Take 1 tablet (81 mg total) by mouth daily.  . bimatoprost (LUMIGAN) 0.01 % SOLN Place 1 drop into both eyes at bedtime.   . brimonidine (ALPHAGAN) 0.15 % ophthalmic solution Place 1 drop into the right eye 2 (two) times daily.  . clindamycin (CLEOCIN) 300 MG capsule Take 300 mg by mouth 3 (three) times daily.  . dorzolamide (TRUSOPT) 2 % ophthalmic solution Place 1 drop into both eyes 3 (three) times daily.  . metoprolol succinate (TOPROL-XL) 25 MG 24 hr tablet TAKE 3 TABLETS (75 MG TOTAL) BY MOUTH DAILY. TAKE WITH OR IMMEDIATELY FOLLOWING A MEAL.  . nitroGLYCERIN (NITROSTAT) 0.4 MG SL tablet Place 1 tablet (0.4 mg total) under the tongue every 5 (five) minutes as needed. For chest pain  . pantoprazole (PROTONIX) 40 MG tablet TAKE 1 TABLET BY MOUTH EVERY DAY (Patient taking differently: TAKE 40 mg TABLET BY MOUTH EVERY DAY)  . ramipril (ALTACE) 10 MG capsule TAKE ONE CAPSULE BY MOUTH EVERY DAY (Patient taking differently: TAKE 10 mg CAPSULE BY MOUTH EVERY DAY)  . ticagrelor (BRILINTA)  90 MG TABS tablet Take 1 tablet (90 mg total) by mouth 2 (two) times daily.  . timolol (BETIMOL) 0.5 % ophthalmic solution Place 1 drop into both eyes 2 (two) times daily.  . traMADol (ULTRAM) 50 MG tablet Take 50 mg by mouth 2 (two) times daily as needed for moderate pain or severe pain.     Allergies: Allergies as of 05/23/2017 - Review Complete 05/23/2017  Allergen Reaction Noted  . Diphenhydramine hcl Other (See Comments)   . Penicillins Other (See Comments) 07/12/2008  . Shellfish allergy Other (See Comments) 08/25/2011   Past Medical History:  Diagnosis Date  . Abscess 2003   I&D SCM abscess  .  Acute ischemic colitis ([employer]) 04/10/11   acute episode of ischemic colitis, with abd pain, CT evidence of transverse colitis, and blood streaked BM.  Resolved with conservative treatment.  . Arthritis    "hands, joints, knees" (07/19/2016)  . Bacteremia 2003   hx of staph bacteremia  . CAD (coronary artery disease) 2003, 2010   s/p stenting of RCA x2, s/p stent to OM;  LHC 04/2009 in the setting of a NSTEMI: EF 65%, RCA stent patent with 60% ISR proximal and 40% ISR mid, PLV proximal 50-60% and distal 50%, ostial circumflex 30-40%, proximal OM mild in-stent restenosis with a branch with 90+ percent ostial stenosis, proximal LAD 30-40%.  FFR of ostial circumflex:  0.96 - not flow limiting.  Medical therapy was continued.  . Chronic lower back pain    "not a big problem" (07/19/2016)  . Closed displaced fracture of distal phalanx of left middle finger 05/23/2017  . Degenerative disc disease   . GERD (gastroesophageal reflux disease)   . History of medication noncompliance    concerning Plavix  . HTN (hypertension)    Echocardiogram 08/26/11: Mild focal basal septal hypertrophy, EF 12-87%, grade 2 diastolic dysfunction, mild MR.  There was a fluid-filled area likely in the location of the liver noted  . Hyperlipidemia   . Hyperparathyroidism 2003   parathyroid adenoma localized to the right inferior thyroid lobe region  . Hypothyroidism   . Left renal artery stenosis (HCC)    40%  . Lumbar stenosis with neurogenic claudication 2006   s/p decompression L2-L5  . Myocardial infarction [facility]) ~ 2013; 05/06/2016  . Osteoporosis 2009   diagnosed by bone density scan in 2009, on Vit D only, no bisphosphonate therapy  . Phlebitis    LLE  . PVD (peripheral vascular disease) (HCC)    s/p left fem-pop bypass graft  . TIA (transient ischemic attack)    carotid dopplers 5/13: no ICA stenosis   Family History:  HTN in both daughters.  Denied other significant family medical history.  Social History:    Denied tobacco, EtOH and illicit drug use.   Review of Systems: A complete ROS was negative except as per HPI.   Physical Exam: Blood pressure 107/66, pulse 60, temperature 98.5 F (36.9 C), temperature source Oral, resp. rate 16, height 5\' 1"  (1.549 m), weight 98 lb (44.5 kg), SpO2 99 %. Physical Exam  Constitutional: She is oriented to person, place, and time. She appears well-developed and well-nourished. No distress.  HENT:  Head: Normocephalic and atraumatic.  Eyes: Conjunctivae and EOM are normal.  Neck: Normal range of motion. Neck supple.  Cardiovascular: Normal rate and regular rhythm.  No murmur heard. Pulmonary/Chest: Effort normal and breath sounds normal. No stridor. No respiratory distress.  Abdominal: Soft. Bowel sounds are normal. She exhibits no distension.  There is no tenderness.  Musculoskeletal: She exhibits edema (See image) and tenderness.  Neurological: She is alert and oriented to person, place, and time.  Skin: Skin is warm. Capillary refill takes less than 2 seconds. She is not diaphoretic.  Psychiatric: She has a normal mood and affect.  Nursing note and vitals reviewed.      EKG: personally reviewed my interpretation is not performed in the ED.  Hand XR: personally reviewed my interpretation is that the patient has a fracture of the third distal phalanx of the left hand with probable osteomyelitis and significant soft tissue swelling.  Assessment & Plan by Problem: Active Problems:   Osteomyelitis of left hand (HCC)   Closed displaced fracture of distal phalanx of left middle finger  Assessment: 24 yoF who presented with a fracture of the left third distal phalanx and probable osteomyelitis of the left third finger with surrounding soft tissue infection. Although she refused to clearly specify the cause of the break despite multiple attempts at procuring this information, she insisted that it was an accident. Given the presentation, it appears as if  a traumatic event occurred to the injured site causing a fracture, a break in the skin and underlying infection that failed outpatient treatment with clindamycin.   Osteomyelitis of left hand: See image above. Failed outpatient clindamycin. No clear signs of sepsis or systemic infection. Vitals are WNL's without a lactic acidosis or leukocytosis.  -Vancomycin continued as per pharmacy -Ceftriaxone continued as per pharmacy -Blood cx pending -Wound Cx pending -CBC in am  Closed displaced fracture of distal phalanx of left middle finger: Hand surgery following.  -As per their note, they are planning on OR surgical intervention in am following bedside I&D.  CAD: S/P stenting of RCA x2 05/06/2016 and OM w/ LHC 04/2009 on duel antiplatelet therapy since. Was to be seen in January to determine need to continue therapy.  -Previous statin intolerance documented, will not initiate at this juncture  -Holding Brilinta and ASA in anticipation of surgery  -Holding ramipril due to soft BP -Holding nitroglycerin until after surgery -Continue metoprolol   HTN: History of HTN. Well controlled on this admission.  -Holding amlodipine 5mg  -BMP in am  Hypercalcemia: History of hypercalcemia with notable hypercalcemia on BMP this admission. Hyperparathyroidism noted on previous studies with parathyroid adenoma in the past. History of osteoporosis not documented history that she had been previously prescribed bisphosphonate therapy in our system.  -Repeat BMP in am -Phosphorus pending  Acute renal injury: Most recent GFR ranging near ~54 with Cr ~1.04. Cr 1.35 with GFR 39 on this admission. Unclear etiology at this point.  -Fluids at 87ml/hr for 12 hours  HFpEF: Last Echo on 05/08/2016 indicating G1DD with EF 50-55%. No acute issues noted. NO acute issues or indication for a repeat echocardiogram.  -Continue metoprolol 75mg  daily following surgery  Diet: NPO after midnight Code: Full Fluids:  32ml/hr Dvt ppx: SCD's Dispo: Admit patient to Inpatient with expected length of stay greater than 2 midnights.  Signed: Kathi Ludwig, MD 05/23/2017, 7:28 PM  Pager: Pager# [phone]

## 2017-05-23 NOTE — ED Notes (Signed)
ED Provider at bedside. 

## 2017-05-24 ENCOUNTER — Inpatient Hospital Stay (HOSPITAL_COMMUNITY): Payer: Medicare Other | Admitting: Anesthesiology

## 2017-05-24 ENCOUNTER — Encounter (HOSPITAL_COMMUNITY)
Admission: EM | Disposition: A | Discharge status: Home / Self Care | Payer: Self-pay | Source: Home / Self Care | Attending: Oncology

## 2017-05-24 DIAGNOSIS — Z7982 Long term (current) use of aspirin: Secondary | ICD-10-CM

## 2017-05-24 DIAGNOSIS — Z8249 Family history of ischemic heart disease and other diseases of the circulatory system: Secondary | ICD-10-CM

## 2017-05-24 DIAGNOSIS — I739 Peripheral vascular disease, unspecified: Secondary | ICD-10-CM

## 2017-05-24 DIAGNOSIS — L089 Local infection of the skin and subcutaneous tissue, unspecified: Secondary | ICD-10-CM

## 2017-05-24 DIAGNOSIS — Z79899 Other long term (current) drug therapy: Secondary | ICD-10-CM

## 2017-05-24 DIAGNOSIS — Z955 Presence of coronary angioplasty implant and graft: Secondary | ICD-10-CM

## 2017-05-24 DIAGNOSIS — B9689 Other specified bacterial agents as the cause of diseases classified elsewhere: Secondary | ICD-10-CM

## 2017-05-24 DIAGNOSIS — Z888 Allergy status to other drugs, medicaments and biological substances status: Secondary | ICD-10-CM

## 2017-05-24 DIAGNOSIS — S62633A Displaced fracture of distal phalanx of left middle finger, initial encounter for closed fracture: Secondary | ICD-10-CM

## 2017-05-24 DIAGNOSIS — M8628 Subacute osteomyelitis, other site: Secondary | ICD-10-CM | POA: Diagnosis not present

## 2017-05-24 DIAGNOSIS — E785 Hyperlipidemia, unspecified: Secondary | ICD-10-CM

## 2017-05-24 DIAGNOSIS — I110 Hypertensive heart disease with heart failure: Secondary | ICD-10-CM

## 2017-05-24 DIAGNOSIS — Z8673 Personal history of transient ischemic attack (TIA), and cerebral infarction without residual deficits: Secondary | ICD-10-CM

## 2017-05-24 DIAGNOSIS — X58XXXA Exposure to other specified factors, initial encounter: Secondary | ICD-10-CM

## 2017-05-24 DIAGNOSIS — I5030 Unspecified diastolic (congestive) heart failure: Secondary | ICD-10-CM

## 2017-05-24 DIAGNOSIS — M86142 Other acute osteomyelitis, left hand: Principal | ICD-10-CM

## 2017-05-24 DIAGNOSIS — D649 Anemia, unspecified: Secondary | ICD-10-CM

## 2017-05-24 DIAGNOSIS — L03012 Cellulitis of left finger: Secondary | ICD-10-CM

## 2017-05-24 DIAGNOSIS — Z91013 Allergy to seafood: Secondary | ICD-10-CM

## 2017-05-24 DIAGNOSIS — Z7902 Long term (current) use of antithrombotics/antiplatelets: Secondary | ICD-10-CM

## 2017-05-24 DIAGNOSIS — Z880 Allergy status to penicillin: Secondary | ICD-10-CM

## 2017-05-24 DIAGNOSIS — Z89022 Acquired absence of left finger(s): Secondary | ICD-10-CM

## 2017-05-24 DIAGNOSIS — I25119 Atherosclerotic heart disease of native coronary artery with unspecified angina pectoris: Secondary | ICD-10-CM

## 2017-05-24 DIAGNOSIS — Z85850 Personal history of malignant neoplasm of thyroid: Secondary | ICD-10-CM

## 2017-05-24 HISTORY — PX: AMPUTATION FINGER: SHX6594

## 2017-05-24 HISTORY — PX: I & D EXTREMITY: SHX5045

## 2017-05-24 LAB — BASIC METABOLIC PANEL
Anion gap: 10 (ref 5–15)
BUN: 22 mg/dL — AB (ref 6–20)
CHLORIDE: 109 mmol/L (ref 101–111)
CO2: 22 mmol/L (ref 22–32)
CREATININE: 1.29 mg/dL — AB (ref 0.44–1.00)
Calcium: 10.8 mg/dL — ABNORMAL HIGH (ref 8.9–10.3)
GFR calc non Af Amer: 36 mL/min — ABNORMAL LOW (ref >60)
GFR, EST AFRICAN AMERICAN: 41 mL/min — AB (ref >60)
GLUCOSE: 87 mg/dL (ref 65–99)
Potassium: 4 mmol/L (ref 3.5–5.1)
Sodium: 141 mmol/L (ref 135–145)

## 2017-05-24 LAB — CBC
HEMATOCRIT: 29.5 % — AB (ref 36.0–46.0)
HEMOGLOBIN: 9.3 g/dL — AB (ref 12.0–15.0)
MCH: 30.8 pg (ref 26.0–34.0)
MCHC: 31.5 g/dL (ref 30.0–36.0)
MCV: 97.7 fL (ref 78.0–100.0)
Platelets: 177 10*3/uL (ref 150–400)
RBC: 3.02 MIL/uL — ABNORMAL LOW (ref 3.87–5.11)
RDW: 13.5 % (ref 11.5–15.5)
WBC: 7.8 10*3/uL (ref 4.0–10.5)

## 2017-05-24 SURGERY — IRRIGATION AND DEBRIDEMENT EXTREMITY
Anesthesia: Choice | Site: Finger | Laterality: Left

## 2017-05-24 MED ORDER — FENTANYL CITRATE (PF) 250 MCG/5ML IJ SOLN
INTRAMUSCULAR | Status: AC
  Filled 2017-05-24: qty 5

## 2017-05-24 MED ORDER — RAMIPRIL 10 MG PO CAPS
10.0000 mg | ORAL_CAPSULE | Freq: Every day | ORAL | Status: DC
  Administered 2017-05-24 – 2017-05-25 (×2): 10 mg via ORAL
  Filled 2017-05-24 (×2): qty 1

## 2017-05-24 MED ORDER — BUPIVACAINE HCL (PF) 0.25 % IJ SOLN
INTRAMUSCULAR | Status: DC | PRN
  Administered 2017-05-24: 11 mL

## 2017-05-24 MED ORDER — OXYCODONE HCL 5 MG PO TABS: 5.0000 mg | ORAL_TABLET | Freq: Once | ORAL | Status: DC | PRN

## 2017-05-24 MED ORDER — OXYCODONE HCL 5 MG/5ML PO SOLN: 5.0000 mg | Freq: Once | ORAL | Status: DC | PRN

## 2017-05-24 MED ORDER — TIMOLOL MALEATE 0.5 % OP SOLN
1.0000 [drp] | Freq: Two times a day (BID) | OPHTHALMIC | Status: DC
  Administered 2017-05-24 – 2017-05-25 (×2): 1 [drp] via OPHTHALMIC
  Filled 2017-05-24: qty 5

## 2017-05-24 MED ORDER — SODIUM CHLORIDE 0.9 % IV SOLN
INTRAVENOUS | Status: AC
  Administered 2017-05-24: 12:00:00 via INTRAVENOUS

## 2017-05-24 MED ORDER — SODIUM CHLORIDE 0.9 % IR SOLN
Status: DC | PRN
  Administered 2017-05-24: 1000 mL

## 2017-05-24 MED ORDER — LACTATED RINGERS IV SOLN
INTRAVENOUS | Status: DC
  Administered 2017-05-24: 07:00:00 via INTRAVENOUS

## 2017-05-24 MED ORDER — PHENOL 1.4 % MT LIQD
1.0000 | OROMUCOSAL | Status: DC | PRN
  Administered 2017-05-24: 1 via OROMUCOSAL
  Filled 2017-05-24: qty 177

## 2017-05-24 MED ORDER — PHENYLEPHRINE HCL 10 MG/ML IJ SOLN
INTRAMUSCULAR | Status: DC | PRN
  Administered 2017-05-24: 40 ug via INTRAVENOUS

## 2017-05-24 MED ORDER — TIMOLOL HEMIHYDRATE 0.5 % OP SOLN: 1.0000 [drp] | Freq: Two times a day (BID) | OPHTHALMIC | Status: DC

## 2017-05-24 MED ORDER — ONDANSETRON HCL 4 MG/2ML IJ SOLN: 4.0000 mg | Freq: Once | INTRAMUSCULAR | Status: DC | PRN

## 2017-05-24 MED ORDER — PROPOFOL 10 MG/ML IV BOLUS
INTRAVENOUS | Status: DC | PRN
  Administered 2017-05-24: 100 mg via INTRAVENOUS

## 2017-05-24 MED ORDER — PROPOFOL 10 MG/ML IV BOLUS
INTRAVENOUS | Status: AC
  Filled 2017-05-24: qty 40

## 2017-05-24 MED ORDER — LACTATED RINGERS IV SOLN
INTRAVENOUS | Status: DC | PRN
  Administered 2017-05-24: 08:00:00 via INTRAVENOUS

## 2017-05-24 MED ORDER — FENTANYL CITRATE (PF) 100 MCG/2ML IJ SOLN: 25.0000 ug | INTRAMUSCULAR | Status: DC | PRN

## 2017-05-24 MED ORDER — LIDOCAINE HCL (CARDIAC) 20 MG/ML IV SOLN
INTRAVENOUS | Status: DC | PRN
  Administered 2017-05-24: 30 mg via INTRAVENOUS

## 2017-05-24 MED ORDER — BUPIVACAINE HCL (PF) 0.25 % IJ SOLN
INTRAMUSCULAR | Status: AC
  Filled 2017-05-24: qty 30

## 2017-05-24 SURGICAL SUPPLY — 42 items
BAG DECANTER FOR FLEXI CONT (MISCELLANEOUS) ×1 IMPLANT
BANDAGE ACE 3X5.8 VEL STRL LF (GAUZE/BANDAGES/DRESSINGS) IMPLANT
BANDAGE ACE 4X5 VEL STRL LF (GAUZE/BANDAGES/DRESSINGS) IMPLANT
BNDG COHESIVE 1X5 TAN STRL LF (GAUZE/BANDAGES/DRESSINGS) ×1 IMPLANT
BNDG CONFORM 2 STRL LF (GAUZE/BANDAGES/DRESSINGS) ×1 IMPLANT
BNDG GAUZE ELAST 4 BULKY (GAUZE/BANDAGES/DRESSINGS) IMPLANT
CORDS BIPOLAR (ELECTRODE) ×1 IMPLANT
CUFF TOURNIQUET SINGLE 18IN (TOURNIQUET CUFF) IMPLANT
DRAPE SURG 17X23 STRL (DRAPES) ×1 IMPLANT
ELECT REM PT RETURN 9FT ADLT (ELECTROSURGICAL)
ELECTRODE REM PT RTRN 9FT ADLT (ELECTROSURGICAL) IMPLANT
FLUID NSS /IRRIG 3000 ML XXX (IV SOLUTION) IMPLANT
GAUZE PACKING IODOFORM 1/4X5 (PACKING) IMPLANT
GAUZE SPONGE 4X4 12PLY STRL (GAUZE/BANDAGES/DRESSINGS) ×1 IMPLANT
GAUZE XEROFORM 1X8 LF (GAUZE/BANDAGES/DRESSINGS) ×2 IMPLANT
GLOVE BIOGEL M 8.0 STRL (GLOVE) ×2 IMPLANT
GOWN STRL REUS W/ TWL LRG LVL3 (GOWN DISPOSABLE) ×2 IMPLANT
GOWN STRL REUS W/TWL LRG LVL3 (GOWN DISPOSABLE) ×4
HANDPIECE INTERPULSE COAX TIP (DISPOSABLE)
KIT BASIN OR (CUSTOM PROCEDURE TRAY) ×2 IMPLANT
KIT ROOM TURNOVER OR (KITS) ×2 IMPLANT
MANIFOLD NEPTUNE II (INSTRUMENTS) ×1 IMPLANT
NDL HYPO 25GX1X1/2 BEV (NEEDLE) IMPLANT
NEEDLE HYPO 25GX1X1/2 BEV (NEEDLE) ×2 IMPLANT
NS IRRIG 1000ML POUR BTL (IV SOLUTION) ×2 IMPLANT
PACK ORTHO EXTREMITY (CUSTOM PROCEDURE TRAY) ×2 IMPLANT
PAD ARMBOARD 7.5X6 YLW CONV (MISCELLANEOUS) ×4 IMPLANT
PAD CAST 4YDX4 CTTN HI CHSV (CAST SUPPLIES) IMPLANT
PADDING CAST COTTON 4X4 STRL (CAST SUPPLIES)
SET CYSTO W/LG BORE CLAMP LF (SET/KITS/TRAYS/PACK) IMPLANT
SET HNDPC FAN SPRY TIP SCT (DISPOSABLE) IMPLANT
SOAP 2 % CHG 4 OZ (WOUND CARE) ×2 IMPLANT
SPONGE LAP 18X18 X RAY DECT (DISPOSABLE) IMPLANT
SPONGE LAP 4X18 X RAY DECT (DISPOSABLE) ×2 IMPLANT
SUT ETHILON 5 0 PS 2 18 (SUTURE) ×1 IMPLANT
SWAB CULTURE ESWAB REG 1ML (MISCELLANEOUS) ×1 IMPLANT
SYR CONTROL 10ML LL (SYRINGE) IMPLANT
TOWEL OR 17X24 6PK STRL BLUE (TOWEL DISPOSABLE) ×2 IMPLANT
TOWEL OR 17X26 10 PK STRL BLUE (TOWEL DISPOSABLE) ×2 IMPLANT
TUBE CONNECTING 12X1/4 (SUCTIONS) ×2 IMPLANT
WATER STERILE IRR 1000ML POUR (IV SOLUTION) ×2 IMPLANT
YANKAUER SUCT BULB TIP NO VENT (SUCTIONS) ×2 IMPLANT

## 2017-05-24 NOTE — Transfer of Care (Signed)
Immediate Anesthesia Transfer of Care Note  Patient: [name]  Procedure(s) Performed: IRRIGATION AND DEBRIDEMENT LEFT LONG FINGER (Left Finger) AMPUTATION tip of long FINGER (Left Finger)  Patient Location: PACU  Anesthesia Type:General  Level of Consciousness: awake and alert   Airway & Oxygen Therapy: Patient Spontanous Breathing and Patient connected to nasal cannula oxygen  Post-op Assessment: Report given to RN and Post -op Vital signs reviewed and stable  Post vital signs: Reviewed and stable  Last Vitals:  Vitals:   05/24/17 0556 05/24/17 0826  BP: (!) 100/55 136/72  Pulse: (!) 58   Resp:  15  Temp: 36.8 C   SpO2: 100%     Last Pain:  Vitals:   05/24/17 0556  TempSrc: Oral  PainSc:          Complications: No apparent anesthesia complications

## 2017-05-24 NOTE — Anesthesia Postprocedure Evaluation (Signed)
Anesthesia Post Note  Patient: [name]  Procedure(s) Performed: IRRIGATION AND DEBRIDEMENT LEFT LONG FINGER (Left Finger) AMPUTATION tip of long FINGER (Left Finger)     Patient location during evaluation: PACU Anesthesia Type: General Level of consciousness: awake and alert Pain management: pain level controlled Vital Signs Assessment: post-procedure vital signs reviewed and stable Respiratory status: spontaneous breathing, nonlabored ventilation, respiratory function stable and patient connected to nasal cannula oxygen Cardiovascular status: blood pressure returned to baseline and stable Postop Assessment: no apparent nausea or vomiting Anesthetic complications: no    Last Vitals:  Vitals:   05/24/17 0845 05/24/17 0909  BP:  132/61  Pulse: 64 61  Resp: 17 18  Temp: (!) 36.4 C 36.7 C  SpO2: 100% 100%    Last Pain:  Vitals:   05/24/17 0909  TempSrc: Oral  PainSc:                  [name]

## 2017-05-24 NOTE — Anesthesia Preprocedure Evaluation (Signed)
Anesthesia Evaluation  Patient identified by MRN, date of birth, ID band Patient awake    Reviewed: Allergy & Precautions, NPO status , Patient's Chart, lab work & pertinent test results  Airway Mallampati: II  TM Distance: >3 FB Neck ROM: Full    Dental  (+) Edentulous Upper, Edentulous Lower   Pulmonary    breath sounds clear to auscultation       Cardiovascular hypertension,  Rhythm:Regular Rate:Normal     Neuro/Psych    GI/Hepatic   Endo/Other    Renal/GU      Musculoskeletal   Abdominal   Peds  Hematology   Anesthesia Other Findings   Reproductive/Obstetrics                             Anesthesia Physical Anesthesia Plan  ASA: III  Anesthesia Plan: General   Post-op Pain Management:    Induction: Intravenous  PONV Risk Score and Plan: Ondansetron and Dexamethasone  Airway Management Planned: LMA  Additional Equipment:   Intra-op Plan:   Post-operative Plan:   Informed Consent: I have reviewed the patients History and Physical, chart, labs and discussed the procedure including the risks, benefits and alternatives for the proposed anesthesia with the patient or authorized representative who has indicated his/her understanding and acceptance.     Plan Discussed with: CRNA and Anesthesiologist  Anesthesia Plan Comments:         Anesthesia Quick Evaluation

## 2017-05-24 NOTE — Progress Notes (Signed)
Brief note:   Finger debrided in OR today, evidence of osteomyelitis distal phalanx - wound debrided, distal phalanx amputation performed, new cultures obtained.  Pt may be discharged on antibiotics when medically clear.  F/U in my office in 1 week. Thank you.

## 2017-05-24 NOTE — Progress Notes (Signed)
Medicine attending: I examined this patient this morning together with resident physician Dr. Neva Seat and I concur with his evaluation and management plan which we discussed together. Please see separate attending admission note for complete details. Elderly woman admitted yesterday with progressive infection of the distal third left finger.  She required a partial amputation in view of evidence for osteomyelitis.  She has been afebrile.  Mild leukocytosis on admission which has improved. No other acute issues at this time.  Preliminary cultures taken in the emergency department yesterday with no growth so far. Anticipate resuming outpatient clindamycin to complete a 7-10-day course. Family present.  Status and plan discussed.

## 2017-05-24 NOTE — Progress Notes (Signed)
Subjective: Patient seen resting comfortably in her bed. Left third digit wrapped with clean dry dressing. She underwent debridement of L third digit with amputation of distal phalanx this AM. Patient states she is feeling well and denies any pain. She states that her surgeon told her she will need to stay another day to get her antibiotics figured out. She states that at home she was improving with clindamycin, but her PCP was not happy with her progress and the sent her to the ED. No complaints or further questions this AM.   Objective:  Vital signs in last 24 hours: Vitals:   05/24/17 0831 05/24/17 0841 05/24/17 0845 05/24/17 0909  BP:  (!) 146/68  132/61  Pulse: 65 63 64 61  Resp: 19 16 17 18   Temp:   (!) 97.5 F (36.4 C) 98.1 F (36.7 C)  TempSrc:    Oral  SpO2: 100% 100% 100% 100%  Weight:      Height:       Physical Exam  Constitutional: She appears well-developed and well-nourished.  HENT:  Head: Normocephalic and atraumatic.  Eyes: EOM are normal. Right eye exhibits no discharge. Left eye exhibits no discharge.  Cardiovascular: Normal rate, regular rhythm, normal heart sounds and intact distal pulses.  Pulmonary/Chest: Effort normal and breath sounds normal. No respiratory distress.  Abdominal: Soft. Bowel sounds are normal. She exhibits no distension. There is no tenderness.  Musculoskeletal: She exhibits no edema or deformity.  L 3rd digit distal phalanx amputation Finger wrapped in clean dry dressing  Neurological: She is alert.  Skin: Skin is warm and dry.   Assessment/Plan: Assessment: 63 yoF who presented with a fracture of the left third distal phalanx and probable osteomyelitis of the left third finger with surrounding soft tissue infection. Failed outpatient treatment with clindamycin.   Osteomyelitis of left hand: Closed displaced fracture of distal phalanx of left middle finger:  Failed outpatient clindamycin. No clear signs of systemic  inflammation/infection. Vitals are WNL; no lactic acidosis, nor leukocytosis. Source control obtained with debridement and amputation of distal phalanx this morning. - Hand surgery following - Vancomycin and Ceftriaxone per pharmacy - Will transition to PO on discharge - Blood Cx pending - Wound Cx pending - CBC in am  Acute renal injury: Cr 1.35 on Admission (baselin ~1.0). Likely prerenal 2/2 dehydratrion with BUN/Cr 19.2.  - Received IVF at 50ml/hr for 8-9 hours, Cr mildly improved to 1.29 - NS @ 61ml/hr for additional 12 hours - Repeat BMP  CAD: S/P DES of RCA x2 (05/06/16) and OM w/ LHC 04/2009. On duel antiplatelet therapy since. Was to be seen in January to determine need to continue therapy.  - Previous statin intolerance documented, will not initiate - Holding Brilinta - Resume ramipril, ASA  - Continue Nitroglycerin  - Continue metoprolol   HTN: History of HTN. Well controlled on this admission.  - Resume Ramipril - Holding amlodipine 5mg  - BMP in am  Hypercalcemia: Ca 11.4 on Admission (Consistent with her baseline)  History of hypercalcemia and hyperparathyroidism with parathyroid adenoma in the past. History of osteoporosis. No documented history bisphosphonate therapy in our system.  - Phosphorus 2.1 - Repeat BMP in am  HFpEF: Last Echo on 05/08/2016 indicating G1DD with EF 50-55%. No acute issues noted. NO acute issues or indication for a repeat echocardiogram.  -Continue Metoprolol 75mg  Daily  FEN: NS@75ml /hr, Heart Healthy Code: Full DVT Prophylaxis: SCD's  Dispo: Anticipated discharge in approximately 0-1 day(s).   Neva Seat, MD  05/24/2017, 9:55 AM Pager: [phone]

## 2017-05-24 NOTE — Discharge Summary (Signed)
Name: [name] MRN: [id number] DOB: 1925-04-23 84 y.o. PCP: Lillard Anes, MD  Date of Admission: 05/23/2017  4:24 PM Date of Discharge:  Attending Physician: Annia Belt, MD  Discharge Diagnosis:  Principal Problem:   Closed displaced fracture of distal phalanx of left middle finger Active Problems:   CAD (coronary artery disease), native coronary artery   Finger infection   Discharge Medications: Allergies as of 05/25/2017      Reactions   Diphenhydramine Hcl Other (See Comments)   unknown   Penicillins Other (See Comments)   unknown   Shellfish Allergy Other (See Comments)   unknown      Medication List    TAKE these medications   acetaminophen 325 MG tablet Commonly known as:  TYLENOL Take 2 tablets (650 mg total) by mouth every 4 (four) hours as needed for headache or mild pain.   ADULT ONE DAILY GUMMIES Chew Chew 2 tablets by mouth daily.   amLODipine 5 MG tablet Commonly known as:  NORVASC TAKE 1 TABLET (5 MG TOTAL) BY MOUTH DAILY.   aspirin 81 MG tablet Take 1 tablet (81 mg total) by mouth daily.   bimatoprost 0.01 % Soln Commonly known as:  LUMIGAN Place 1 drop into both eyes at bedtime.   brimonidine 0.15 % ophthalmic solution Commonly known as:  ALPHAGAN Place 1 drop into the right eye 2 (two) times daily. What changed:    how to take this  when to take this   clindamycin 300 MG capsule Commonly known as:  CLEOCIN Take 300 mg by mouth 3 (three) times daily. 10 day course started 05/21/17   CoQ10 50 MG Caps Take 50 mg by mouth daily.   dorzolamide 2 % ophthalmic solution Commonly known as:  TRUSOPT Place 1 drop into both eyes 3 (three) times daily.   metoprolol succinate 25 MG 24 hr tablet Commonly known as:  TOPROL-XL TAKE 3 TABLETS (75 MG TOTAL) BY MOUTH DAILY. TAKE WITH OR IMMEDIATELY FOLLOWING A MEAL. What changed:    how much to take  when to take this  additional instructions   nitroGLYCERIN 0.4  MG SL tablet Commonly known as:  NITROSTAT Place 1 tablet (0.4 mg total) under the tongue every 5 (five) minutes as needed. For chest pain What changed:    reasons to take this  additional instructions   pantoprazole 40 MG tablet Commonly known as:  PROTONIX TAKE 1 TABLET BY MOUTH EVERY DAY What changed:    how much to take  how to take this  when to take this   ramipril 10 MG capsule Commonly known as:  ALTACE TAKE ONE CAPSULE BY MOUTH EVERY DAY What changed:    how much to take  how to take this  when to take this   ticagrelor 90 MG Tabs tablet Commonly known as:  BRILINTA Take 1 tablet (90 mg total) by mouth 2 (two) times daily.   timolol 0.5 % ophthalmic solution Commonly known as:  BETIMOL Place 1 drop into both eyes 2 (two) times daily.   traMADol 50 MG tablet Commonly known as:  ULTRAM Take 50 mg by mouth daily as needed (pain).       Disposition and follow-up:   [name] was discharged from [facility] in Good condition.  At the hospital follow up visit please address:  1.  Her wound condition. 2.  Completion of her antibiotics. 3.  She need to discuss about Brilinta  with her cardiology, whether to continue or discontinue at this time.  2.  Labs / imaging needed at time of follow-up: None  3.  Pending labs/ test needing follow-up: Blood Cultures, Wound Cultures, Intraoperative Cultures sensitivity report.  Follow-up Appointments: Follow-up Information    Dayna Barker, MD In 1 week.   Specialty:  General Surgery Contact information: [address] [place] [id number] [phone]           Hospital Course by problem list:  Cellulitis and Osteomyelitis of left hand: Closed displaced fracture of distal phalanx of left middle finger 84 yo F with extensive PMHx history presented with pain, erythema, and edema of the L third digit for 5 days. She had failed to significantly improve with outpatinet  antibiotics and was found to have cellulitis and fractured distal phalanx (on xray) with probable osteomyelitis. There were no signs of systemic infection (no fever, no leukocytosis, normal lactate). Cultures were obtained and she was started on Vancomycin and Ceftriaxone. She was seen by orthopedics who performed an I&D at bedside overnight and took her to the OR for debridement and amputation of her distal phalanx in the morning. Evidence of osteomyelitis of the distal phalanx was seen operatively. The patient tolerated the procedure well and operative cultures were obtained. The patient was transitioned to oral clindamycin as initial culture report shows abundant staph aureus, sensitivity to follow.  And discharged with a 7 day course.   Discharge Vitals:   BP (!) 129/50 (BP Location: Right Arm)   Pulse 60   Temp 98.3 F (36.8 C) (Oral)   Resp 20   Ht 5\' 1"  (1.549 m)   Wt 98 lb (44.5 kg)   SpO2 99%   BMI 18.52 kg/m   General.  Well-developed elderly lady, in no acute distress. Extremities.  Left middle finger with bandage, no oozing.  No edema, no cyanosis, pulses intact and symmetrical bilaterally. Lungs.  Clear bilaterally. CVS.  Regular rate and rhythm. Abdomen.  Soft, nondistended, bowel sounds positive.   Pertinent Labs, Studies, and Procedures:  CBC Latest Ref Rng & Units 05/24/2017 05/23/2017 07/20/2016  WBC 4.0 - 10.5 K/uL 7.8 9.4 4.9  Hemoglobin 12.0 - 15.0 g/dL 9.3(L) 9.8(L) 10.5(L)  Hematocrit 36.0 - 46.0 % 29.5(L) 31.0(L) 34.0(L)  Platelets 150 - 400 K/uL 177 185 147(L)   BMP Latest Ref Rng & Units 05/25/2017 05/24/2017 05/23/2017  Glucose 65 - 99 mg/dL 125(H) 87 166(H)  BUN 6 - 20 mg/dL 16 22(H) 26(H)  Creatinine 0.44 - 1.00 mg/dL 1.07(H) 1.29(H) 1.35(H)  Sodium 135 - 145 mmol/L 142 141 141  Potassium 3.5 - 5.1 mmol/L 4.0 4.0 4.0  Chloride 101 - 111 mmol/L 112(H) 109 109  CO2 22 - 32 mmol/L 20(L) 22 21(L)  Calcium 8.9 - 10.3 mg/dL 10.9(H) 10.8(H) 11.4(H)   Xray L Hand  Complete: IMPRESSION: Changes suggestive of fracture of the third distal phalanx with flexion of the DIP joint. Given the degree of soft tissue swelling the possibility of underlying osteomyelitis in the distal phalanx cannot be totally excluded. MRI would be helpful for further evaluation as clinically necessary.   Discharge Instructions: Discharge Instructions    Diet - low sodium heart healthy   Complete by:  As directed    Discharge instructions   Complete by:  As directed    It was pleasure taking care of you. Please continue taking clindamycin for 1 week, your orthopedic surgeon can decide later on about continuation of antibiotic. Please  make a follow-up appointment with orthopedic surgeon to be seen in 1 week. Please make a follow-up appointment with your primary care to be seen within a week.   Increase activity slowly   Complete by:  As directed       Signed: Lorella Nimrod, MD 05/25/2017, 11:09 AM   Pager: [phone]

## 2017-05-24 NOTE — Anesthesia Procedure Notes (Signed)
Procedure Name: LMA Insertion Date/Time: 05/24/2017 7:47 AM Performed by: Eligha Bridegroom, CRNA Pre-anesthesia Checklist: Patient identified, Emergency Drugs available, Suction available, Patient being monitored and Timeout performed Patient Re-evaluated:Patient Re-evaluated prior to induction Oxygen Delivery Method: Circle system utilized Preoxygenation: Pre-oxygenation with 100% oxygen Induction Type: IV induction LMA: LMA flexible inserted LMA Size: 4.0 Placement Confirmation: positive ETCO2 and breath sounds checked- equal and bilateral Tube secured with: Tape Dental Injury: Teeth and Oropharynx as per pre-operative assessment

## 2017-05-25 ENCOUNTER — Encounter (HOSPITAL_COMMUNITY): Payer: Self-pay | Admitting: General Surgery

## 2017-05-25 LAB — CBC
HCT: 29.7 % — ABNORMAL LOW (ref 36.0–46.0)
Hemoglobin: 9.3 g/dL — ABNORMAL LOW (ref 12.0–15.0)
MCH: 30.7 pg (ref 26.0–34.0)
MCHC: 31.3 g/dL (ref 30.0–36.0)
MCV: 98 fL (ref 78.0–100.0)
PLATELETS: 182 10*3/uL (ref 150–400)
RBC: 3.03 MIL/uL — AB (ref 3.87–5.11)
RDW: 13.4 % (ref 11.5–15.5)
WBC: 7.7 10*3/uL (ref 4.0–10.5)

## 2017-05-25 LAB — BASIC METABOLIC PANEL
ANION GAP: 10 (ref 5–15)
BUN: 16 mg/dL (ref 6–20)
CO2: 20 mmol/L — ABNORMAL LOW (ref 22–32)
Calcium: 10.9 mg/dL — ABNORMAL HIGH (ref 8.9–10.3)
Chloride: 112 mmol/L — ABNORMAL HIGH (ref 101–111)
Creatinine, Ser: 1.07 mg/dL — ABNORMAL HIGH (ref 0.44–1.00)
GFR, EST AFRICAN AMERICAN: 52 mL/min — AB (ref >60)
GFR, EST NON AFRICAN AMERICAN: 45 mL/min — AB (ref >60)
Glucose, Bld: 125 mg/dL — ABNORMAL HIGH (ref 65–99)
POTASSIUM: 4 mmol/L (ref 3.5–5.1)
SODIUM: 142 mmol/L (ref 135–145)

## 2017-05-25 MED ORDER — METOPROLOL SUCCINATE ER 50 MG PO TB24
50.0000 mg | ORAL_TABLET | Freq: Every day | ORAL | Status: DC
  Administered 2017-05-25: 50 mg via ORAL

## 2017-05-25 MED ORDER — BRIMONIDINE TARTRATE 0.2 % OP SOLN
1.0000 [drp] | Freq: Three times a day (TID) | OPHTHALMIC | Status: DC
  Administered 2017-05-25: 1 [drp] via OPHTHALMIC
  Filled 2017-05-25: qty 5

## 2017-05-25 MED ORDER — VANCOMYCIN HCL 500 MG IV SOLR
500.0000 mg | INTRAVENOUS | Status: DC
  Administered 2017-05-25: 500 mg via INTRAVENOUS
  Filled 2017-05-25: qty 500

## 2017-05-25 MED ORDER — METOPROLOL SUCCINATE ER 25 MG PO TB24: 25.0000 mg | ORAL_TABLET | Freq: Every day | ORAL | Status: DC

## 2017-05-25 NOTE — Progress Notes (Signed)
ANTIBIOTIC CONSULT NOTE   Pharmacy Consult for Vanco/Rocephin Indication: finger osteo  Allergies  Allergen Reactions  . Diphenhydramine Hcl Other (See Comments)    unknown  . Penicillins Other (See Comments)    unknown  . Shellfish Allergy Other (See Comments)    unknown    Patient Measurements: Height: 5\' 1"  (154.9 cm) Weight: 98 lb (44.5 kg) IBW/kg (Calculated) : 47.8 Adjusted Body Weight:    Vital Signs: Temp: 98.3 F (36.8 C) (02/10 0500) Temp Source: Oral (02/10 0500) BP: 129/50 (02/10 0500) Pulse Rate: 60 (02/10 0500) Intake/Output from previous day: 02/09 0701 - 02/10 0700 In: 530 [P.O.:480; I.V.:50] Out: 3 [Blood:3] Intake/Output from this shift: No intake/output data recorded.  Labs: Recent Labs    05/23/17 1248 05/24/17 0555 05/25/17 0802  WBC 9.4 7.8  --   HGB 9.8* 9.3*  --   PLT 185 177  --   CREATININE 1.35* 1.29* 1.07*   Estimated Creatinine Clearance: 25 mL/min (A) (by C-G formula based on SCr of 1.07 mg/dL (H)). No results for input(s): VANCOTROUGH, VANCOPEAK, VANCORANDOM, GENTTROUGH, GENTPEAK, GENTRANDOM, TOBRATROUGH, TOBRAPEAK, TOBRARND, AMIKACINPEAK, AMIKACINTROU, AMIKACIN in the last 72 hours.   Microbiology:   Medical History: Past Medical History:  Diagnosis Date  . Abscess 2003   I&D SCM abscess  . Acute ischemic colitis ([facility]) 04/10/11   acute episode of ischemic colitis, with abd pain, CT evidence of transverse colitis, and blood streaked BM.  Resolved with conservative treatment.  . Arthritis    "hands, joints, knees" (07/19/2016)  . Bacteremia 2003   hx of staph bacteremia  . CAD (coronary artery disease) 2003, 2010   s/p stenting of RCA x2, s/p stent to OM;  LHC 04/2009 in the setting of a NSTEMI: EF 65%, RCA stent patent with 60% ISR proximal and 40% ISR mid, PLV proximal 50-60% and distal 50%, ostial circumflex 30-40%, proximal OM mild in-stent restenosis with a branch with 90+ percent ostial stenosis, proximal LAD 30-40%.  FFR  of ostial circumflex:  0.96 - not flow limiting.  Medical therapy was continued.  . Chronic lower back pain    "not a big problem" (07/19/2016)  . Closed displaced fracture of distal phalanx of left middle finger 05/23/2017  . Degenerative disc disease   . GERD (gastroesophageal reflux disease)   . History of medication noncompliance    concerning Plavix  . HTN (hypertension)    Echocardiogram 08/26/11: Mild focal basal septal hypertrophy, EF 32-35%, grade 2 diastolic dysfunction, mild MR.  There was a fluid-filled area likely in the location of the liver noted  . Hyperlipidemia   . Hyperparathyroidism 2003   parathyroid adenoma localized to the right inferior thyroid lobe region  . Hypothyroidism   . Left renal artery stenosis ([facility])    40%  . Lumbar stenosis with neurogenic claudication 2006   s/p decompression L2-L5  . Myocardial infarction [facility]) ~ 2013; 05/06/2016  . Osteoporosis 2009   diagnosed by bone density scan in 2009, on Vit D only, no bisphosphonate therapy  . Phlebitis    LLE  . PVD (peripheral vascular disease) ([facility])    s/p left fem-pop bypass graft  . TIA (transient ischemic attack)    carotid dopplers 5/13: no ICA stenosis    Assessment: ID: Finger Osteo, Afebrile. WBC 7.8 down. F/u cultures. Scr 1.07 down - 2/9: I&D L long finger, amputation of tip of L long finger. - 2/9 MD note: Anticipate resuming outpatient clindamycin to complete a 7-10-day course.  Vanco 2/8>>  Rocephin 2/8>>  2/8: BC x 2>> 2/9: Bone tissue>> 2/8: Finger joint>>  Goal of Therapy:  Vancomycin trough level 15-20 mcg/ml  Plan:  Start ceftriaxone 1g IV Q24h Increase Vanco to 500mg  IV q 24hrs today with improved renal function Needs several home eye drops resumed. Home Brilinta not resumed   Eliu Batch S. Alford Highland, PharmD, BCPS Clinical Staff Pharmacist Pager [phone]  Eilene Ghazi Stillinger 05/25/2017,9:41 AM

## 2017-05-25 NOTE — Progress Notes (Signed)
   Subjective: Patient was feeling better when seen this morning.  She denies any pain.  Objective:  Vital signs in last 24 hours: Vitals:   05/24/17 0909 05/24/17 1518 05/24/17 2058 05/25/17 0500  BP: 132/61 128/68 (!) 112/48 (!) 129/50  Pulse: 61 (!) 55 60 60  Resp: 18 18 20 20   Temp: 98.1 F (36.7 C) 98 F (36.7 C) 99 F (37.2 C) 98.3 F (36.8 C)  TempSrc: Oral Oral Oral Oral  SpO2: 100% 99% 100% 99%  Weight:      Height:       General.  Well-developed elderly lady, in no acute distress. Extremities.  Left middle finger with bandage, no oozing.  No edema, no cyanosis, pulses intact and symmetrical bilaterally. Lungs.  Clear bilaterally. CVS.  Regular rate and rhythm. Abdomen.  Soft, nondistended, bowel sounds positive.  Assessment/Plan:  Assessment:89 yoF who presented with a fracture of the left third distal phalanx and probable osteomyelitis of the left third finger with surrounding soft tissue infection. Failed outpatient treatment with clindamycin.  Osteomyelitis of left hand: S/P left middle distal phalanx amputation.  Pain is well controlled without any analgesic. Intraoperative culture growing gram-positive cocci with abundant staph aureus.  Sensitivity reports to follow. Patient was placed on clindamycin by her PCP and she was doing well on that, she still have whole bottle full of medicine at home. -She can be discharged on clindamycin for 1 week as she already had her amputation. -Follow-up with her PCP and orthopedic in 1 week.  Acute renal injury:  Improving with creatinine of 1.07 today. -Encouraged patient to keep herself well-hydrated.  CAD: S/P DES of RCA x2 (05/06/16) and OM w/ LHC 04/2009. On duel antiplatelet therapy since. Was to be seen in January to determine need to continue therapy. - Previous statin intolerance documented, will not initiate - Holding Brilinta-can be resumed on discharge to be decided by her cardiologist. - Continue ramipril,  ASA - Continue Nitroglycerin  - Continuemetoprolol  HTN: History of HTN. Well controlled on this admission. -She can continue home meds.  Hypercalcemia: Ca 11.4 on Admission, it was 10.9 today, (Consistent with her baseline)  History of hypercalcemia and hyperparathyroidism with parathyroid adenoma in the past. History of osteoporosis. No documented history bisphosphonate therapy in our system.  - Phosphorus 2.1 -Management per primary care.  HFpEF: Last Echo on 05/08/2016 indicating G1DD with EF 50-55%. No acute issues noted.NO acute issues or indication for a repeat echocardiogram. -Continue Metoprolol 75mg  Daily  Dispo: Being discharged today.  Lorella Nimrod, MD 05/25/2017, 10:49 AM Pager: [phone]

## 2017-05-26 LAB — AEROBIC CULTURE  (SUPERFICIAL SPECIMEN): CULTURE: NO GROWTH

## 2017-05-26 LAB — AEROBIC CULTURE W GRAM STAIN (SUPERFICIAL SPECIMEN)

## 2017-05-26 NOTE — Op Note (Signed)
NAME:  [name], PECH                     ACCOUNT NO.:  MEDICAL RECORD NO.:  [id number]  LOCATION:                                 FACILITY:  PHYSICIAN:  Dennie Bible, MD         DATE OF BIRTH:  DATE OF PROCEDURE:  05/24/2017 DATE OF DISCHARGE:                              OPERATIVE REPORT   PREOPERATIVE DIAGNOSIS:  Infection of the left long finger.  POSTOPERATIVE DIAGNOSIS:  Infection of the left long finger.  PROCEDURES: 1. Debridement of left long finger, drainage of flexor tendon sheath. 2. Distal phalanx amputation with local flap closure.  INDICATIONS:  [name] is an 84 year old female who has had some problems with her finger for some time.  She has been treated as an outpatient with antibiotics.  She comes to the emergency department with a diffuse swollen finger with actively draining infection.  On x-ray, it showed evidence of osteomyelitis.  She was consented for debridement, possible amputation.  DESCRIPTION OF PROCEDURE:  The patient was taken to the operating room and placed supine on the operating table.  Anesthesia was administered. Time-out was performed.  The patient has been on IV antibiotics and therefore no additional antibiotics were needed.  The left upper extremity was prepped and draped in a normal sterile fashion. Initially, the wound was evaluated.  On x-ray, there was a distal bony fragment just up underneath the nail.  A fishmouth incision was made beneath the nail and there was indeed a necrotic soft tissue tract with some old necrotic bone.  This tract was followed more proximally and involved the entire remnant of the distal phalanx as well as the surrounding soft tissue.  Debridement of soft tissue was performed and this necrosis was full thickness.  Therefore, the remainder of the distal phalanx was removed and a small portion of the head of the proximal phalanx was rongeured to clear all infection.  The soft tissues were sharply  debrided and then the skin was fashioned for closure.  The neurovascular bundles were retracted and cauterized.  The wound was then irrigated with irrigation solution.  Through this incision, the flexor tendon sheath was able to be opened.  There did not appear to be any purulent fluid in the sheath.  This was sterilely irrigated.  Next, hemostasis was controlled with bipolar and the incision was closed with multiple interrupted 5-0 nylon sutures.  Sterile dressing was applied. The patient tolerated the procedure well.     Dennie Bible, MD     HCC/MEDQ  D:  05/24/2017  T:  05/24/2017  Job:  [id number]

## 2017-05-28 LAB — CULTURE, BLOOD (ROUTINE X 2)
Culture: NO GROWTH
Culture: NO GROWTH
SPECIAL REQUESTS: ADEQUATE
SPECIAL REQUESTS: ADEQUATE

## 2017-05-29 LAB — AEROBIC/ANAEROBIC CULTURE (SURGICAL/DEEP WOUND)

## 2017-05-29 LAB — AEROBIC/ANAEROBIC CULTURE W GRAM STAIN (SURGICAL/DEEP WOUND)

## 2017-06-16 ENCOUNTER — Telehealth: Payer: Self-pay | Admitting: Cardiology

## 2017-06-16 MED ORDER — TICAGRELOR 90 MG PO TABS: 90.0000 mg | ORAL_TABLET | Freq: Two times a day (BID) | ORAL | 11 refills | Status: DC

## 2017-06-16 NOTE — Telephone Encounter (Signed)
lmtcb  rx sent to [facility] in [place].

## 2017-06-16 NOTE — Telephone Encounter (Signed)
New message    Patient daughter Solmon Ice called wants to know if her mom needs to continue the  ticagrelor (BRILINTA) 90 MG TABS tablet Take 1 tablet (90 mg total) by mouth 2 (two) times daily.   If she needs to continue this med she needs a refill called into [facility]   30 day supply

## 2017-06-17 NOTE — Telephone Encounter (Signed)
Left message that medication was sent to pharmacy for patient . Any question may call back

## 2017-06-26 ENCOUNTER — Other Ambulatory Visit: Payer: Self-pay | Admitting: Cardiology

## 2017-06-26 ENCOUNTER — Telehealth: Payer: Self-pay | Admitting: Cardiology

## 2017-06-26 NOTE — Telephone Encounter (Signed)
Closed Encounter  °

## 2017-07-14 DIAGNOSIS — H401133 Primary open-angle glaucoma, bilateral, severe stage: Secondary | ICD-10-CM | POA: Diagnosis not present

## 2017-07-17 ENCOUNTER — Ambulatory Visit: Payer: Medicare Other | Admitting: Cardiology

## 2017-07-17 ENCOUNTER — Other Ambulatory Visit: Payer: Self-pay | Admitting: Cardiology

## 2017-07-21 DIAGNOSIS — M8628 Subacute osteomyelitis, other site: Secondary | ICD-10-CM | POA: Diagnosis not present

## 2017-07-23 NOTE — Progress Notes (Signed)
HPI The patient presents for followup of known coronary disease. She was in the hospital in Jan 2018 with NSTEMI. She had an acute inferior lateral STEMI.   This was felt to be secondary to RCA occlusion. She had stenting of distal RCA into PDA with DES. The remainder of RCA was treated with POBA. She does have residual disease in mid LAD up to 90%. This is a heavily calcified vessel. Per Dr. Martinique  the left main disease is significant.  The LAD disease is acomplex lesion and would require atherectomy. Given her age the decision was to manage medically but if she has recurrent angina it was thought that it would be reasonable to consider PCI of LAD.  She was started on a statin. She came back to the hospital with chest pain but this time was found to have elevated liver enzymes.  She was taken off statin.     Since I last saw him he has done well.  She does her housework and she tolerates this.  The patient denies any new symptoms such as chest discomfort, neck or arm discomfort. There has been no new shortness of breath, PND or orthopnea. There have been no reported palpitations, presyncope or syncope.    Allergies  Allergen Reactions  . Diphenhydramine Hcl Other (See Comments)    unknown  . Penicillins Other (See Comments)    unknown  . Shellfish Allergy Other (See Comments)    unknown    Current Outpatient Medications  Medication Sig Dispense Refill  . amLODipine (NORVASC) 5 MG tablet TAKE 1 TABLET (5 MG TOTAL) BY MOUTH DAILY. 90 tablet 3  . aspirin 81 MG tablet Take 1 tablet (81 mg total) by mouth daily.    . bimatoprost (LUMIGAN) 0.01 % SOLN Place 1 drop into both eyes at bedtime.     . brimonidine (ALPHAGAN) 0.15 % ophthalmic solution Place 1 drop into the right eye 2 (two) times daily. (Patient taking differently: Place 1 drop into both eyes 3 (three) times daily. ) 5 mL 0  . Coenzyme Q10 (COQ10) 50 MG CAPS Take 50 mg by mouth daily.    . dorzolamide (TRUSOPT) 2 % ophthalmic  solution Place 1 drop into both eyes 3 (three) times daily.    . metoprolol succinate (TOPROL-XL) 25 MG 24 hr tablet Take 3 tablets (75 mg total) by mouth daily. Take with or immediately following a meal. PLEASE KEEP UPCOMING APPT 90 tablet 1  . Multiple Vitamins-Minerals (ADULT ONE DAILY GUMMIES) CHEW Chew 2 tablets by mouth daily.    . nitroGLYCERIN (NITROSTAT) 0.4 MG SL tablet Place 1 tablet (0.4 mg total) under the tongue every 5 (five) minutes as needed. For chest pain (Patient taking differently: Place 0.4 mg under the tongue every 5 (five) minutes as needed for chest pain. ) 25 tablet 2  . pantoprazole (PROTONIX) 40 MG tablet TAKE 1 TABLET BY MOUTH EVERY DAY 30 tablet 0  . ramipril (ALTACE) 10 MG capsule TAKE ONE CAPSULE BY MOUTH EVERY DAY (Patient taking differently: TAKE ONE CAPSULE (10 MG) BY MOUTH EVERY DAY) 30 capsule 6  . ticagrelor (BRILINTA) 90 MG TABS tablet Take 1 tablet (90 mg total) by mouth 2 (two) times daily. 60 tablet 11  . timolol (BETIMOL) 0.5 % ophthalmic solution Place 1 drop into both eyes 2 (two) times daily.    . traMADol (ULTRAM) 50 MG tablet Take 50 mg by mouth daily as needed (pain).      No  current facility-administered medications for this visit.     Past Medical History:  Diagnosis Date  . Abscess 2003   I&D SCM abscess  . Acute ischemic colitis ([place]) 04/10/11   acute episode of ischemic colitis, with abd pain, CT evidence of transverse colitis, and blood streaked BM.  Resolved with conservative treatment.  . Arthritis    "hands, joints, knees" (07/19/2016)  . Bacteremia 2003   hx of staph bacteremia  . CAD (coronary artery disease) 2003, 2010   s/p stenting of RCA x2, s/p stent to OM;  LHC 04/2009 in the setting of a NSTEMI: EF 65%, RCA stent patent with 60% ISR proximal and 40% ISR mid, PLV proximal 50-60% and distal 50%, ostial circumflex 30-40%, proximal OM mild in-stent restenosis with a branch with 90+ percent ostial stenosis, proximal LAD 30-40%.  FFR  of ostial circumflex:  0.96 - not flow limiting.  Medical therapy was continued.  . Chronic lower back pain    "not a big problem" (07/19/2016)  . Closed displaced fracture of distal phalanx of left middle finger 05/23/2017  . Degenerative disc disease   . GERD (gastroesophageal reflux disease)   . History of medication noncompliance    concerning Plavix  . HTN (hypertension)    Echocardiogram 08/26/11: Mild focal basal septal hypertrophy, EF 51-76%, grade 2 diastolic dysfunction, mild MR.  There was a fluid-filled area likely in the location of the liver noted  . Hyperlipidemia   . Hyperparathyroidism 2003   parathyroid adenoma localized to the right inferior thyroid lobe region  . Hypothyroidism   . Left renal artery stenosis (HCC)    40%  . Lumbar stenosis with neurogenic claudication 2006   s/p decompression L2-L5  . Myocardial infarction [facility]) ~ 2013; 05/06/2016  . Osteoporosis 2009   diagnosed by bone density scan in 2009, on Vit D only, no bisphosphonate therapy  . Phlebitis    LLE  . PVD (peripheral vascular disease) (HCC)    s/p left fem-pop bypass graft  . TIA (transient ischemic attack)    carotid dopplers 5/13: no ICA stenosis    Past Surgical History:  Procedure Laterality Date  . AMPUTATION FINGER Left 05/24/2017   Procedure: AMPUTATION tip of long FINGER;  Surgeon: Dayna Barker, MD;  Location: [place];  Service: Plastics;  Laterality: Left;  . CARDIAC CATHETERIZATION N/A 05/06/2016   Procedure: Left Heart Cath and Coronary Angiography;  Surgeon: Lorretta Harp, MD;  Location: [place] CV LAB;  Service: Cardiovascular;  Laterality: N/A;  . CARDIAC CATHETERIZATION N/A 05/06/2016   Procedure: Coronary Stent Intervention;  Surgeon: Lorretta Harp, MD;  Location: [place] CV LAB;  Service: Cardiovascular;  Laterality: N/A;  RCA  . CATARACT EXTRACTION, BILATERAL Bilateral   . CORONARY ANGIOPLASTY WITH STENT PLACEMENT  09/2008  . CORONARY ANGIOPLASTY WITH STENT PLACEMENT   04/2001  . ENDOSCOPIC HEMILAMINOTOMY W/ DISCECTOMY LUMBAR  06/2004   L3, L4 lami; L2, L5 hemilami; decompresion L2-3, 3-4, 4-  . FEMORAL BYPASS  before 2003   LLE  . I&D EXTREMITY Left 05/24/2017   Procedure: IRRIGATION AND DEBRIDEMENT LEFT LONG FINGER;  Surgeon: Dayna Barker, MD;  Location: [place];  Service: Plastics;  Laterality: Left;  . INCISION AND DRAINAGE ANTERIOR NECK  10/2001   absces sternoclavicular joint    ROS:    As stated in the HPI and negative for all other systems.  PHYSICAL EXAM BP 136/70   Pulse (!) 52   Ht 5\' 1"  (1.549 m)  Wt 95 lb 9.6 oz (43.4 kg)   BMI 18.06 kg/m   GENERAL:  Well appearing NECK:  No jugular venous distention, waveform within normal limits, carotid upstroke brisk and symmetric, no bruits, no thyromegaly LUNGS:  Clear to auscultation bilaterally CHEST:  Unremarkable HEART:  PMI not displaced or sustained,S1 and S2 within normal limits, no S3, no S4, no clicks, no rubs, no murmurs ABD:  Flat, positive bowel sounds normal in frequency in pitch, no bruits, no rebound, no guarding, no midline pulsatile mass, no hepatomegaly, no splenomegaly EXT:  2 plus pulses upper and decreased DP/PT bilateral lower, no edema, no cyanosis no clubbing   EKG: Sinus rhythm, rate 52, axis within normal limits, intervals within normal limits, no acute ST-T wave changes.  07/24/2017  ASSESSMENT AND PLAN  CAD:    The patient has no new sypmtoms.  No further cardiovascular testing is indicated.  We will continue with aggressive risk reduction and meds as listed.     She is doing well with medication management.  I am going to stop Brilinta.    HTN:  The blood pressure is at target. No change in medications is indicated. We will continue with therapeutic lifestyle changes (TLC).  DYSLIPIDEMIA:        She is not tolerant of statins.  No change in therapy.

## 2017-07-24 ENCOUNTER — Ambulatory Visit (INDEPENDENT_AMBULATORY_CARE_PROVIDER_SITE_OTHER): Payer: Medicare Other | Admitting: Cardiology

## 2017-07-24 ENCOUNTER — Encounter: Payer: Self-pay | Admitting: Cardiology

## 2017-07-24 VITALS — BP 136/70 | HR 52 | Ht 61.0 in | Wt 95.6 lb

## 2017-07-24 DIAGNOSIS — I2510 Atherosclerotic heart disease of native coronary artery without angina pectoris: Secondary | ICD-10-CM

## 2017-07-24 DIAGNOSIS — I10 Essential (primary) hypertension: Secondary | ICD-10-CM

## 2017-07-24 NOTE — Patient Instructions (Signed)
Medication Instructions:  Continue current medications  If you need a refill on your cardiac medications before your next appointment, please call your pharmacy.  Labwork: None Ordered   Testing/Procedures: None Ordered  Follow-Up: Your physician wants you to follow-up in: 1 Year. You should receive a reminder letter in the mail two months in advance. If you do not receive a letter, please call our office [phone].    Thank you for choosing [facility]!!      

## 2017-07-28 ENCOUNTER — Other Ambulatory Visit: Payer: Self-pay | Admitting: Cardiology

## 2017-07-28 ENCOUNTER — Telehealth: Payer: Self-pay | Admitting: Cardiology

## 2017-07-28 NOTE — Telephone Encounter (Signed)
New Message    *STAT* If patient is at the pharmacy, call can be transferred to refill team.   1. Which medications need to be refilled? (please list name of each medication and dose if known) nitroGLYCERIN (NITROSTAT) 0.4 MG SL tablet  2. Which pharmacy/location (including street and city if local pharmacy) is medication to be sent to? [facility]   3. Do they need a 30 day or 90 day supply? [place]

## 2017-07-29 ENCOUNTER — Telehealth: Payer: Self-pay | Admitting: Cardiology

## 2017-07-29 MED ORDER — NITROGLYCERIN 0.4 MG SL SUBL: 0.4000 mg | SUBLINGUAL_TABLET | SUBLINGUAL | 2 refills | Status: DC | PRN

## 2017-07-29 NOTE — Telephone Encounter (Signed)
° ° °  Pt daughter calling to check on the Nitroglycerin she requested be sent to the pharmacy yesterday. Please call

## 2017-07-29 NOTE — Telephone Encounter (Signed)
nitroGLYCERIN (NITROSTAT) 0.4 MG SL tablet 25 tablet 2 07/29/2017    Sig - Route: Place 1 tablet (0.4 mg total) under the tongue every 5 (five) minutes as needed. For chest pain - Sublingual   Sent to pharmacy as: nitroGLYCERIN (NITROSTAT) 0.4 MG SL tablet   E-Prescribing Status: Receipt confirmed by pharmacy (07/29/2017 8:07 AM EDT)   Pharmacy   [id number] [place] [place] that med was refilled

## 2017-09-05 DIAGNOSIS — H401133 Primary open-angle glaucoma, bilateral, severe stage: Secondary | ICD-10-CM | POA: Diagnosis not present

## 2017-09-11 DIAGNOSIS — H401133 Primary open-angle glaucoma, bilateral, severe stage: Secondary | ICD-10-CM | POA: Diagnosis not present

## 2017-10-02 ENCOUNTER — Other Ambulatory Visit: Payer: Self-pay | Admitting: Cardiology

## 2017-10-02 NOTE — Telephone Encounter (Signed)
Rx(s) sent to pharmacy electronically.  

## 2017-10-14 DIAGNOSIS — H401133 Primary open-angle glaucoma, bilateral, severe stage: Secondary | ICD-10-CM | POA: Diagnosis not present

## 2017-10-14 DIAGNOSIS — H401123 Primary open-angle glaucoma, left eye, severe stage: Secondary | ICD-10-CM | POA: Diagnosis not present

## 2017-10-14 DIAGNOSIS — Z01818 Encounter for other preprocedural examination: Secondary | ICD-10-CM | POA: Diagnosis not present

## 2017-10-22 DIAGNOSIS — H409 Unspecified glaucoma: Secondary | ICD-10-CM | POA: Diagnosis not present

## 2017-10-22 DIAGNOSIS — H401123 Primary open-angle glaucoma, left eye, severe stage: Secondary | ICD-10-CM | POA: Diagnosis not present

## 2017-11-07 ENCOUNTER — Telehealth: Payer: Self-pay | Admitting: Cardiology

## 2017-11-07 NOTE — Telephone Encounter (Signed)
I called the number listed- unable to leave a message "mailbox full".   In reviewing the pt's chart it appears Dr Percival Spanish had stopped her Kary Kos in April. We just need to check with the patient to clear her.  Kerin Ransom PA-C 11/07/2017 4:41 PM

## 2017-11-07 NOTE — Telephone Encounter (Signed)
   [facility] Pre-operative Risk Assessment    Request for surgical clearance:  1. What type of surgery is being performed? CPC laser - glaucoma (right eye)  2. When is this surgery scheduled? 11/17/17   3. What type of clearance is required (medical clearance vs. Pharmacy clearance to hold med vs. Both)? Both   4. Are there any medications that need to be held prior to surgery and how long? None specified - on ASA & Brilinta   5. Practice name and name of physician performing surgery? Dr. Truddie Coco @ [facility]   6. What is your office phone number [phone] ext: [phone] for Rinaldo Cloud NP    7.   What is your office fax number [phone]  8.   Anesthesia type (None, local, MAC, general) ?  IV sedation   Sheral Apley M 11/07/2017, 3:23 PM  _________________________________________________________________   (provider comments below)

## 2017-11-10 NOTE — Telephone Encounter (Signed)
I called unable to leave message.

## 2017-11-11 ENCOUNTER — Telehealth: Payer: Self-pay | Admitting: Cardiology

## 2017-11-11 NOTE — Telephone Encounter (Signed)
New Message         [place] with [facility] is calling to give a better contact # for patient  (Daughter Solmon Ice) [phone]  Caryl Pina also states you can call her if needed.

## 2017-11-11 NOTE — Telephone Encounter (Signed)
   Primary Cardiologist: Minus Breeding, MD  Chart reviewed as part of pre-operative protocol coverage. Given past medical history and time since last visit, based on ACC/AHA guidelines, AMAANI GUILBAULT would be at acceptable risk for the planned procedure without further cardiovascular testing.  She is on Brilinta and will check with Dr. Percival Spanish on holding asprin and brilinta.   I will route this recommendation to the requesting party via Epic fax function and remove from pre-op pool.  Please call with questions.  Cecilie Kicks, NP 11/11/2017, 1:45 PM

## 2017-11-12 ENCOUNTER — Other Ambulatory Visit: Payer: Self-pay | Admitting: Cardiology

## 2017-11-12 NOTE — Telephone Encounter (Signed)
New Message:      [name] is calling and states the pt does not need to stop her blood thinners for the type of procedure she is having done. She states all the pt needs is surgical clearance.

## 2017-11-12 NOTE — Telephone Encounter (Addendum)
   Primary Cardiologist:James Hochrein, MD  Chart reviewed as part of pre-operative protocol coverage. Pre-op clearance already addressed by colleagues in earlier phone notes. To summarize recommendations:  -Chart reviewed as part of pre-operative protocol coverage. Given past medical history and time since last visit, based on ACC/AHA guidelines, [name] would be at acceptable risk for the planned procedure without further cardiovascular testing.   Also requesting physician's office called our office on 7/06/13/17 with update stating that pt is NOT required to hold any blood thinners, she will thus continue on Brilinta.   Will route this bundled recommendation to requesting provider via Epic fax function. Please call with questions.  Lyda Jester, PA-C 11/12/2017, 2:10 PM

## 2017-11-13 NOTE — Telephone Encounter (Signed)
Rx sent to pharmacy   

## 2017-11-20 DIAGNOSIS — N183 Chronic kidney disease, stage 3 (moderate): Secondary | ICD-10-CM | POA: Diagnosis not present

## 2017-11-20 DIAGNOSIS — H409 Unspecified glaucoma: Secondary | ICD-10-CM | POA: Diagnosis not present

## 2017-11-20 DIAGNOSIS — E782 Mixed hyperlipidemia: Secondary | ICD-10-CM | POA: Diagnosis not present

## 2017-11-20 DIAGNOSIS — Z01818 Encounter for other preprocedural examination: Secondary | ICD-10-CM | POA: Diagnosis not present

## 2017-11-20 DIAGNOSIS — M810 Age-related osteoporosis without current pathological fracture: Secondary | ICD-10-CM | POA: Diagnosis not present

## 2017-11-20 DIAGNOSIS — Z681 Body mass index (BMI) 19 or less, adult: Secondary | ICD-10-CM | POA: Diagnosis not present

## 2017-11-20 DIAGNOSIS — I10 Essential (primary) hypertension: Secondary | ICD-10-CM | POA: Diagnosis not present

## 2017-11-20 DIAGNOSIS — E440 Moderate protein-calorie malnutrition: Secondary | ICD-10-CM | POA: Diagnosis not present

## 2017-11-20 DIAGNOSIS — G459 Transient cerebral ischemic attack, unspecified: Secondary | ICD-10-CM | POA: Diagnosis not present

## 2017-11-20 DIAGNOSIS — I2510 Atherosclerotic heart disease of native coronary artery without angina pectoris: Secondary | ICD-10-CM | POA: Diagnosis not present

## 2017-11-24 DIAGNOSIS — H401113 Primary open-angle glaucoma, right eye, severe stage: Secondary | ICD-10-CM | POA: Diagnosis not present

## 2017-11-24 DIAGNOSIS — H409 Unspecified glaucoma: Secondary | ICD-10-CM | POA: Diagnosis not present

## 2018-01-19 ENCOUNTER — Other Ambulatory Visit: Payer: Self-pay | Admitting: Cardiology

## 2018-03-10 DIAGNOSIS — H401113 Primary open-angle glaucoma, right eye, severe stage: Secondary | ICD-10-CM | POA: Diagnosis not present

## 2018-03-10 DIAGNOSIS — H401123 Primary open-angle glaucoma, left eye, severe stage: Secondary | ICD-10-CM | POA: Diagnosis not present

## 2018-03-29 ENCOUNTER — Observation Stay (HOSPITAL_COMMUNITY)
Admission: EM | Admit: 2018-03-29 | Discharge: 2018-03-31 | Disposition: A | Discharge status: Home / Self Care | Payer: Medicare Other | Attending: Internal Medicine | Admitting: Internal Medicine

## 2018-03-29 ENCOUNTER — Emergency Department (HOSPITAL_COMMUNITY): Payer: Medicare Other

## 2018-03-29 ENCOUNTER — Encounter (HOSPITAL_COMMUNITY): Payer: Self-pay | Admitting: Emergency Medicine

## 2018-03-29 DIAGNOSIS — E785 Hyperlipidemia, unspecified: Secondary | ICD-10-CM | POA: Insufficient documentation

## 2018-03-29 DIAGNOSIS — Z955 Presence of coronary angioplasty implant and graft: Secondary | ICD-10-CM | POA: Insufficient documentation

## 2018-03-29 DIAGNOSIS — Z79891 Long term (current) use of opiate analgesic: Secondary | ICD-10-CM | POA: Insufficient documentation

## 2018-03-29 DIAGNOSIS — K529 Noninfective gastroenteritis and colitis, unspecified: Secondary | ICD-10-CM | POA: Diagnosis not present

## 2018-03-29 DIAGNOSIS — E213 Hyperparathyroidism, unspecified: Secondary | ICD-10-CM | POA: Insufficient documentation

## 2018-03-29 DIAGNOSIS — R1084 Generalized abdominal pain: Secondary | ICD-10-CM | POA: Diagnosis not present

## 2018-03-29 DIAGNOSIS — K8689 Other specified diseases of pancreas: Secondary | ICD-10-CM | POA: Diagnosis present

## 2018-03-29 DIAGNOSIS — I959 Hypotension, unspecified: Secondary | ICD-10-CM | POA: Diagnosis not present

## 2018-03-29 DIAGNOSIS — Z7982 Long term (current) use of aspirin: Secondary | ICD-10-CM | POA: Insufficient documentation

## 2018-03-29 DIAGNOSIS — Z8673 Personal history of transient ischemic attack (TIA), and cerebral infarction without residual deficits: Secondary | ICD-10-CM | POA: Insufficient documentation

## 2018-03-29 DIAGNOSIS — I700 Atherosclerosis of aorta: Secondary | ICD-10-CM | POA: Diagnosis not present

## 2018-03-29 DIAGNOSIS — Z8719 Personal history of other diseases of the digestive system: Secondary | ICD-10-CM

## 2018-03-29 DIAGNOSIS — I2510 Atherosclerotic heart disease of native coronary artery without angina pectoris: Secondary | ICD-10-CM | POA: Diagnosis present

## 2018-03-29 DIAGNOSIS — Z79899 Other long term (current) drug therapy: Secondary | ICD-10-CM | POA: Insufficient documentation

## 2018-03-29 DIAGNOSIS — I10 Essential (primary) hypertension: Secondary | ICD-10-CM | POA: Diagnosis not present

## 2018-03-29 DIAGNOSIS — I739 Peripheral vascular disease, unspecified: Secondary | ICD-10-CM | POA: Diagnosis not present

## 2018-03-29 DIAGNOSIS — R109 Unspecified abdominal pain: Secondary | ICD-10-CM | POA: Diagnosis present

## 2018-03-29 DIAGNOSIS — I252 Old myocardial infarction: Secondary | ICD-10-CM | POA: Diagnosis not present

## 2018-03-29 DIAGNOSIS — K869 Disease of pancreas, unspecified: Secondary | ICD-10-CM | POA: Insufficient documentation

## 2018-03-29 DIAGNOSIS — E860 Dehydration: Secondary | ICD-10-CM | POA: Diagnosis not present

## 2018-03-29 DIAGNOSIS — Z880 Allergy status to penicillin: Secondary | ICD-10-CM | POA: Insufficient documentation

## 2018-03-29 DIAGNOSIS — Z91013 Allergy to seafood: Secondary | ICD-10-CM | POA: Diagnosis not present

## 2018-03-29 DIAGNOSIS — N179 Acute kidney failure, unspecified: Secondary | ICD-10-CM | POA: Diagnosis not present

## 2018-03-29 DIAGNOSIS — R110 Nausea: Secondary | ICD-10-CM | POA: Diagnosis present

## 2018-03-29 DIAGNOSIS — K3189 Other diseases of stomach and duodenum: Secondary | ICD-10-CM | POA: Diagnosis not present

## 2018-03-29 DIAGNOSIS — R001 Bradycardia, unspecified: Secondary | ICD-10-CM | POA: Diagnosis not present

## 2018-03-29 DIAGNOSIS — I110 Hypertensive heart disease with heart failure: Secondary | ICD-10-CM | POA: Diagnosis present

## 2018-03-29 DIAGNOSIS — K219 Gastro-esophageal reflux disease without esophagitis: Secondary | ICD-10-CM | POA: Diagnosis not present

## 2018-03-29 DIAGNOSIS — R140 Abdominal distension (gaseous): Secondary | ICD-10-CM | POA: Diagnosis not present

## 2018-03-29 DIAGNOSIS — R52 Pain, unspecified: Secondary | ICD-10-CM | POA: Diagnosis not present

## 2018-03-29 LAB — COMPREHENSIVE METABOLIC PANEL
ALT: 16 U/L (ref 0–44)
ANION GAP: 7 (ref 5–15)
AST: 30 U/L (ref 15–41)
Albumin: 4.3 g/dL (ref 3.5–5.0)
Alkaline Phosphatase: 139 U/L — ABNORMAL HIGH (ref 38–126)
BILIRUBIN TOTAL: 0.7 mg/dL (ref 0.3–1.2)
BUN: 37 mg/dL — ABNORMAL HIGH (ref 8–23)
CO2: 25 mmol/L (ref 22–32)
Calcium: 12.8 mg/dL — ABNORMAL HIGH (ref 8.9–10.3)
Chloride: 109 mmol/L (ref 98–111)
Creatinine, Ser: 1.75 mg/dL — ABNORMAL HIGH (ref 0.44–1.00)
GFR calc Af Amer: 29 mL/min — ABNORMAL LOW (ref >60)
GFR, EST NON AFRICAN AMERICAN: 25 mL/min — AB (ref >60)
Glucose, Bld: 106 mg/dL — ABNORMAL HIGH (ref 70–99)
POTASSIUM: 5.4 mmol/L — AB (ref 3.5–5.1)
Sodium: 141 mmol/L (ref 135–145)
TOTAL PROTEIN: 8.6 g/dL — AB (ref 6.5–8.1)

## 2018-03-29 LAB — CBC WITH DIFFERENTIAL/PLATELET
Abs Immature Granulocytes: 0.06 10*3/uL (ref 0.00–0.07)
BASOS PCT: 1 %
Basophils Absolute: 0.1 10*3/uL (ref 0.0–0.1)
EOS ABS: 0.4 10*3/uL (ref 0.0–0.5)
EOS PCT: 3 %
HEMATOCRIT: 41.4 % (ref 36.0–46.0)
Hemoglobin: 12.5 g/dL (ref 12.0–15.0)
Immature Granulocytes: 1 %
LYMPHS ABS: 1.7 10*3/uL (ref 0.7–4.0)
Lymphocytes Relative: 15 %
MCH: 30.5 pg (ref 26.0–34.0)
MCHC: 30.2 g/dL (ref 30.0–36.0)
MCV: 101 fL — AB (ref 80.0–100.0)
MONOS PCT: 8 %
Monocytes Absolute: 0.9 10*3/uL (ref 0.1–1.0)
NRBC: 0 % (ref 0.0–0.2)
Neutro Abs: 8.5 10*3/uL — ABNORMAL HIGH (ref 1.7–7.7)
Neutrophils Relative %: 72 %
Platelets: 195 10*3/uL (ref 150–400)
RBC: 4.1 MIL/uL (ref 3.87–5.11)
RDW: 12.7 % (ref 11.5–15.5)
WBC: 11.7 10*3/uL — ABNORMAL HIGH (ref 4.0–10.5)

## 2018-03-29 LAB — I-STAT CHEM 8, ED
BUN: 38 mg/dL — ABNORMAL HIGH (ref 8–23)
CREATININE: 1.8 mg/dL — AB (ref 0.44–1.00)
Calcium, Ion: 1.72 mmol/L (ref 1.15–1.40)
Chloride: 114 mmol/L — ABNORMAL HIGH (ref 98–111)
Glucose, Bld: 101 mg/dL — ABNORMAL HIGH (ref 70–99)
HEMATOCRIT: 41 % (ref 36.0–46.0)
Hemoglobin: 13.9 g/dL (ref 12.0–15.0)
Potassium: 5.4 mmol/L — ABNORMAL HIGH (ref 3.5–5.1)
Sodium: 141 mmol/L (ref 135–145)
TCO2: 24 mmol/L (ref 22–32)

## 2018-03-29 LAB — LIPASE, BLOOD: LIPASE: 19 U/L (ref 11–51)

## 2018-03-29 MED ORDER — SODIUM CHLORIDE 0.9 % IV SOLN
2.0000 g | INTRAVENOUS | Status: DC
  Administered 2018-03-30 – 2018-03-31 (×2): 2 g via INTRAVENOUS
  Filled 2018-03-29 (×3): qty 20

## 2018-03-29 MED ORDER — METRONIDAZOLE IN NACL 5-0.79 MG/ML-% IV SOLN
500.0000 mg | Freq: Three times a day (TID) | INTRAVENOUS | Status: DC
  Administered 2018-03-30 – 2018-03-31 (×4): 500 mg via INTRAVENOUS
  Filled 2018-03-29 (×4): qty 100

## 2018-03-29 MED ORDER — SODIUM CHLORIDE 0.9 % IV BOLUS
500.0000 mL | Freq: Once | INTRAVENOUS | Status: AC
  Administered 2018-03-29: 500 mL via INTRAVENOUS

## 2018-03-29 NOTE — ED Provider Notes (Signed)
[facility] EMERGENCY DEPARTMENT Provider Note   CSN: [id number] Arrival date & time: 03/29/18  1953     History   Chief Complaint Chief Complaint  Patient presents with  . Abdominal Pain    HPI [name] is a 84 y.o. female.  The history is provided by the patient. No language interpreter was used.  Abdominal Pain   This is a new problem. The current episode started less than 1 hour ago. The pain is associated with eating. The pain is located in the generalized abdominal region. The pain is moderate. Associated symptoms include nausea. Pertinent negatives include anorexia, fever, diarrhea, vomiting, dysuria, hematuria and arthralgias. Nothing aggravates the symptoms. Nothing relieves the symptoms.    Past Medical History:  Diagnosis Date  . Abscess 2003   I&D SCM abscess  . Acute ischemic colitis ([place]) 04/10/11   acute episode of ischemic colitis, with abd pain, CT evidence of transverse colitis, and blood streaked BM.  Resolved with conservative treatment.  . Arthritis    "hands, joints, knees" (07/19/2016)  . Bacteremia 2003   hx of staph bacteremia  . CAD (coronary artery disease) 2003, 2010   s/p stenting of RCA x2, s/p stent to OM;  LHC 04/2009 in the setting of a NSTEMI: EF 65%, RCA stent patent with 60% ISR proximal and 40% ISR mid, PLV proximal 50-60% and distal 50%, ostial circumflex 30-40%, proximal OM mild in-stent restenosis with a branch with 90+ percent ostial stenosis, proximal LAD 30-40%.  FFR of ostial circumflex:  0.96 - not flow limiting.  Medical therapy was continued.  . Chronic lower back pain    "not a big problem" (07/19/2016)  . Closed displaced fracture of distal phalanx of left middle finger 05/23/2017  . Degenerative disc disease   . GERD (gastroesophageal reflux disease)   . History of medication noncompliance    concerning Plavix  . HTN (hypertension)    Echocardiogram 08/26/11: Mild focal basal septal hypertrophy, EF 55-60%,  grade 2 diastolic dysfunction, mild MR.  There was a fluid-filled area likely in the location of the liver noted  . Hyperlipidemia   . Hyperparathyroidism 2003   parathyroid adenoma localized to the right inferior thyroid lobe region  . Hypothyroidism   . Left renal artery stenosis (HCC)    40%  . Lumbar stenosis with neurogenic claudication 2006   s/p decompression L2-L5  . Myocardial infarction [facility]) ~ 2013; 05/06/2016  . Osteoporosis 2009   diagnosed by bone density scan in 2009, on Vit D only, no bisphosphonate therapy  . Phlebitis    LLE  . PVD (peripheral vascular disease) (HCC)    s/p left fem-pop bypass graft  . TIA (transient ischemic attack)    carotid dopplers 5/13: no ICA stenosis    Patient Active Problem List   Diagnosis Date Noted  . Finger infection   . Closed displaced fracture of distal phalanx of left middle finger 05/23/2017  . Chest pain at rest 07/21/2016  . Unstable angina ([place]) 07/19/2016  . Pancreatic mass   . Abnormal liver function test   . 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 History of TIA (transient ischemic attack)   . Depression 04/10/2011  . Peripheral vascular disease ([place]) 04/11/2008  . Hyperlipidemia   . Hypertension   . CAD (coronary artery disease), native coronary artery     Past Surgical History:  Procedure Laterality Date  . AMPUTATION FINGER Left 05/24/2017   Procedure: AMPUTATION tip of long  FINGER;  Surgeon: Dayna Barker, MD;  Location: [place];  Service: Plastics;  Laterality: Left;  . CARDIAC CATHETERIZATION N/A 05/06/2016   Procedure: Left Heart Cath and Coronary Angiography;  Surgeon: Lorretta Harp, MD;  Location: [place] CV LAB;  Service: Cardiovascular;  Laterality: N/A;  . CARDIAC CATHETERIZATION N/A 05/06/2016   Procedure: Coronary Stent Intervention;  Surgeon: Lorretta Harp, MD;  Location: [place] CV LAB;  Service: Cardiovascular;  Laterality: N/A;  RCA  . CATARACT EXTRACTION, BILATERAL  Bilateral   . CORONARY ANGIOPLASTY WITH STENT PLACEMENT  09/2008  . CORONARY ANGIOPLASTY WITH STENT PLACEMENT  04/2001  . ENDOSCOPIC HEMILAMINOTOMY W/ DISCECTOMY LUMBAR  06/2004   L3, L4 lami; L2, L5 hemilami; decompresion L2-3, 3-4, 4-  . FEMORAL BYPASS  before 2003   LLE  . I&D EXTREMITY Left 05/24/2017   Procedure: IRRIGATION AND DEBRIDEMENT LEFT LONG FINGER;  Surgeon: Dayna Barker, MD;  Location: [place];  Service: Plastics;  Laterality: Left;  . INCISION AND DRAINAGE ANTERIOR NECK  10/2001   absces sternoclavicular joint     OB History   No obstetric history on file.      Home Medications    Prior to Admission medications   Medication Sig Start Date End Date Taking? Authorizing Provider  amLODipine (NORVASC) 5 MG tablet TAKE 1 TABLET BY MOUTH EVERY DAY Patient taking differently: Take 5 mg by mouth daily.  11/13/17  Yes Minus Breeding, MD  aspirin 81 MG tablet Take 1 tablet (81 mg total) by mouth daily. 08/21/12  Yes Hochrein, Jeneen Rinks, MD  bimatoprost (LUMIGAN) 0.01 % SOLN Place 1 drop into both eyes at bedtime.    Yes [provider]  brimonidine (ALPHAGAN) 0.15 % ophthalmic solution Place 1 drop into the right eye 2 (two) times daily. Patient taking differently: Place 1 drop into both eyes 3 (three) times daily.  04/16/11  Yes Hester Mates, MD  Coenzyme Q10 (COQ10) 50 MG CAPS Take 50 mg by mouth daily.   Yes [provider]  dorzolamide (TRUSOPT) 2 % ophthalmic solution Place 1 drop into both eyes 3 (three) times daily.   Yes [provider]  metoprolol succinate (TOPROL-XL) 25 MG 24 hr tablet Take 3 tablets (75 mg total) by mouth daily. 10/02/17  Yes Minus Breeding, MD  Multiple Vitamins-Minerals (ADULT ONE DAILY GUMMIES) CHEW Chew 2 tablets by mouth daily.   Yes [provider]  nitroGLYCERIN (NITROSTAT) 0.4 MG SL tablet Place 1 tablet (0.4 mg total) under the tongue every 5 (five) minutes as needed. For chest pain 07/29/17  Yes Hochrein, Jeneen Rinks, MD    pantoprazole (PROTONIX) 40 MG tablet TAKE 1 TABLET BY MOUTH EVERY DAY Patient taking differently: Take 40 mg by mouth daily.  01/19/18  Yes Minus Breeding, MD  ramipril (ALTACE) 10 MG capsule TAKE ONE CAPSULE BY MOUTH EVERY DAY Patient taking differently: Take 10 mg by mouth daily.  09/30/12  Yes Minus Breeding, MD  ticagrelor (BRILINTA) 90 MG TABS tablet Take 1 tablet (90 mg total) by mouth 2 (two) times daily. 06/16/17  Yes Minus Breeding, MD  timolol (BETIMOL) 0.5 % ophthalmic solution Place 1 drop into both eyes 2 (two) times daily.   Yes [provider]  traMADol (ULTRAM) 50 MG tablet Take 50 mg by mouth daily as needed (pain).    Yes [provider]  simvastatin (ZOCOR) 40 MG tablet Take 1 tablet (40 mg total) by mouth at bedtime. 04/16/11 06/20/11  Hester Mates, MD  Family History Family History  Problem Relation Age of Onset  . Stroke Mother   . Stroke Father     Social History Social History   Tobacco Use  . Smoking status: Never Smoker  . Smokeless tobacco: Never Used  Substance Use Topics  . Alcohol use: No  . Drug use: No     Allergies   Diphenhydramine hcl; Penicillins; and Shellfish allergy   Review of Systems Review of Systems  Constitutional: Positive for activity change. Negative for chills and fever.  HENT: Negative for ear pain and sore throat.   Eyes: Negative for pain and visual disturbance.  Respiratory: Negative for cough and shortness of breath.   Cardiovascular: Negative for chest pain and palpitations.  Gastrointestinal: Positive for abdominal pain and nausea. Negative for anorexia, diarrhea and vomiting.  Genitourinary: Negative for dysuria and hematuria.  Musculoskeletal: Negative for arthralgias and back pain.  Skin: Negative for color change and rash.  Neurological: Negative for seizures and syncope.  All other systems reviewed and are negative.    Physical Exam Updated Vital Signs BP (!) 143/64   Pulse (!) 55   Resp  12   SpO2 90%   Physical Exam Vitals signs and nursing note reviewed.  Constitutional:      General: She is not in acute distress.    Appearance: She is well-developed.  HENT:     Head: Normocephalic and atraumatic.  Eyes:     Conjunctiva/sclera: Conjunctivae normal.  Neck:     Musculoskeletal: Neck supple.  Cardiovascular:     Rate and Rhythm: Normal rate and regular rhythm.     Heart sounds: No murmur.  Pulmonary:     Effort: Pulmonary effort is normal. No respiratory distress.     Breath sounds: Normal breath sounds.  Abdominal:     Palpations: Abdomen is soft.     Tenderness: There is generalized abdominal tenderness. There is no right CVA tenderness, left CVA tenderness, guarding or rebound.  Skin:    General: Skin is warm and dry.  Neurological:     Mental Status: She is alert.      ED Treatments / Results  Labs (all labs ordered are listed, but only abnormal results are displayed) Labs Reviewed  CBC WITH DIFFERENTIAL/PLATELET - Abnormal; Notable for the following components:      Result Value   WBC 11.7 (*)    MCV 101.0 (*)    Neutro Abs 8.5 (*)    All other components within normal limits  COMPREHENSIVE METABOLIC PANEL - Abnormal; Notable for the following components:   Potassium 5.4 (*)    Glucose, Bld 106 (*)    BUN 37 (*)    Creatinine, Ser 1.75 (*)    Calcium 12.8 (*)    Total Protein 8.6 (*)    Alkaline Phosphatase 139 (*)    GFR calc non Af Amer 25 (*)    GFR calc Af Amer 29 (*)    All other components within normal limits  I-STAT CHEM 8, ED - Abnormal; Notable for the following components:   Potassium 5.4 (*)    Chloride 114 (*)    BUN 38 (*)    Creatinine, Ser 1.80 (*)    Glucose, Bld 101 (*)    Calcium, Ion 1.72 (*)    All other components within normal limits  LIPASE, BLOOD  URINALYSIS, ROUTINE W REFLEX MICROSCOPIC  URINALYSIS, COMPLETE (UACMP) WITH MICROSCOPIC    EKG None  Radiology No results found.  Procedures Procedures  (  including critical care time)  Medications Ordered in ED Medications  sodium chloride 0.9 % bolus 500 mL (500 mLs Intravenous New Bag/Given 03/29/18 2155)     Initial Impression / Assessment and Plan / ED Course  I have reviewed the triage vital signs and the nursing notes.  Pertinent labs & imaging results that were available during my care of the patient were reviewed by me and considered in my medical decision making (see chart for details).    Patient is a 59-year-old female with past medical history significant for coronary artery disease presenting for abdominal pain that started 30 to 45 minutes prior to arrival, resolving at the time of evaluation.  Patient states that she had overwhelming nausea, but no episodes of emesis.  Patient is hemodynamically stable at this time. Labs remarkable for elevation in creatinine from 1.07-1.75.  Patient has elevated white blood cell count to 11.7. CT scan concerning for colitis, patient is now having diarrhea while in the hospital.  Antibiotics were ordered.   Patient will be admitted for IV antibiotics, dehydration and creatinine recheck.  Final Clinical Impressions(s) / ED Diagnoses   Final diagnoses:  Generalized abdominal pain  Colitis    ED Discharge Orders    None       Erskine Squibb, MD 03/29/18 2316    Lajean Saver, MD 04/03/18 [phone]

## 2018-03-29 NOTE — ED Triage Notes (Signed)
Patient complaining of abdominal pain after eating with diaphoresis and nausea, no vomiting.  She is not having as much pain at this time.  She rated at 5/10 upon EMS arrival.  She is hypotensive initially, was given 250 ml NS en route to ED.   Patient is now 702 systolic.  Sinus brady in the 50's.

## 2018-03-30 ENCOUNTER — Other Ambulatory Visit: Payer: Self-pay

## 2018-03-30 ENCOUNTER — Encounter (HOSPITAL_COMMUNITY): Payer: Self-pay

## 2018-03-30 DIAGNOSIS — K529 Noninfective gastroenteritis and colitis, unspecified: Secondary | ICD-10-CM | POA: Diagnosis not present

## 2018-03-30 DIAGNOSIS — I10 Essential (primary) hypertension: Secondary | ICD-10-CM

## 2018-03-30 DIAGNOSIS — Z8719 Personal history of other diseases of the digestive system: Secondary | ICD-10-CM

## 2018-03-30 DIAGNOSIS — I2510 Atherosclerotic heart disease of native coronary artery without angina pectoris: Secondary | ICD-10-CM | POA: Diagnosis not present

## 2018-03-30 DIAGNOSIS — R1084 Generalized abdominal pain: Secondary | ICD-10-CM | POA: Diagnosis not present

## 2018-03-30 DIAGNOSIS — N179 Acute kidney failure, unspecified: Secondary | ICD-10-CM | POA: Diagnosis not present

## 2018-03-30 DIAGNOSIS — K8689 Other specified diseases of pancreas: Secondary | ICD-10-CM | POA: Diagnosis not present

## 2018-03-30 LAB — CBC
HCT: 37 % (ref 36.0–46.0)
Hemoglobin: 10.8 g/dL — ABNORMAL LOW (ref 12.0–15.0)
MCH: 30.1 pg (ref 26.0–34.0)
MCHC: 29.2 g/dL — ABNORMAL LOW (ref 30.0–36.0)
MCV: 103.1 fL — ABNORMAL HIGH (ref 80.0–100.0)
Platelets: 172 10*3/uL (ref 150–400)
RBC: 3.59 MIL/uL — ABNORMAL LOW (ref 3.87–5.11)
RDW: 12.7 % (ref 11.5–15.5)
WBC: 12.2 10*3/uL — AB (ref 4.0–10.5)
nRBC: 0.2 % (ref 0.0–0.2)

## 2018-03-30 LAB — LACTIC ACID, PLASMA
LACTIC ACID, VENOUS: 0.8 mmol/L (ref 0.5–1.9)
LACTIC ACID, VENOUS: 0.9 mmol/L (ref 0.5–1.9)

## 2018-03-30 LAB — BASIC METABOLIC PANEL
ANION GAP: 13 (ref 5–15)
BUN: 37 mg/dL — ABNORMAL HIGH (ref 8–23)
CO2: 20 mmol/L — ABNORMAL LOW (ref 22–32)
Calcium: 11.5 mg/dL — ABNORMAL HIGH (ref 8.9–10.3)
Chloride: 111 mmol/L (ref 98–111)
Creatinine, Ser: 1.65 mg/dL — ABNORMAL HIGH (ref 0.44–1.00)
GFR calc Af Amer: 31 mL/min — ABNORMAL LOW (ref >60)
GFR calc non Af Amer: 27 mL/min — ABNORMAL LOW (ref >60)
Glucose, Bld: 160 mg/dL — ABNORMAL HIGH (ref 70–99)
Potassium: 4.9 mmol/L (ref 3.5–5.1)
Sodium: 144 mmol/L (ref 135–145)

## 2018-03-30 LAB — TROPONIN I: Troponin I: <0.03 ng/mL (ref <0.03)

## 2018-03-30 MED ORDER — ONDANSETRON HCL 4 MG/2ML IJ SOLN: 4.0000 mg | Freq: Four times a day (QID) | INTRAMUSCULAR | Status: DC | PRN

## 2018-03-30 MED ORDER — TIMOLOL MALEATE 0.5 % OP SOLN
1.0000 [drp] | Freq: Two times a day (BID) | OPHTHALMIC | Status: DC
  Administered 2018-03-30 – 2018-03-31 (×3): 1 [drp] via OPHTHALMIC
  Filled 2018-03-30: qty 5

## 2018-03-30 MED ORDER — MORPHINE SULFATE (PF) 2 MG/ML IV SOLN: 2.0000 mg | INTRAVENOUS | Status: DC | PRN

## 2018-03-30 MED ORDER — METOPROLOL SUCCINATE ER 50 MG PO TB24
75.0000 mg | ORAL_TABLET | Freq: Every day | ORAL | Status: DC
  Administered 2018-03-30 – 2018-03-31 (×2): 75 mg via ORAL
  Filled 2018-03-30 (×3): qty 1

## 2018-03-30 MED ORDER — BRIMONIDINE TARTRATE 0.15 % OP SOLN
1.0000 [drp] | Freq: Three times a day (TID) | OPHTHALMIC | Status: DC
  Administered 2018-03-30 – 2018-03-31 (×5): 1 [drp] via OPHTHALMIC
  Filled 2018-03-30: qty 5

## 2018-03-30 MED ORDER — SODIUM CHLORIDE 0.9 % IV SOLN
INTRAVENOUS | Status: DC
  Administered 2018-03-30 – 2018-03-31 (×2): via INTRAVENOUS

## 2018-03-30 MED ORDER — LATANOPROST 0.005 % OP SOLN
1.0000 [drp] | Freq: Every day | OPHTHALMIC | Status: DC
  Administered 2018-03-31: 1 [drp] via OPHTHALMIC
  Filled 2018-03-30: qty 2.5

## 2018-03-30 MED ORDER — ENOXAPARIN SODIUM 30 MG/0.3ML ~~LOC~~ SOLN
30.0000 mg | SUBCUTANEOUS | Status: DC
  Administered 2018-03-31: 30 mg via SUBCUTANEOUS
  Filled 2018-03-30: qty 0.3

## 2018-03-30 MED ORDER — TICAGRELOR 90 MG PO TABS
90.0000 mg | ORAL_TABLET | Freq: Two times a day (BID) | ORAL | Status: DC
  Administered 2018-03-30 – 2018-03-31 (×3): 90 mg via ORAL
  Filled 2018-03-30 (×3): qty 1

## 2018-03-30 MED ORDER — ASPIRIN 81 MG PO CHEW
81.0000 mg | CHEWABLE_TABLET | Freq: Every day | ORAL | Status: DC
  Administered 2018-03-30 – 2018-03-31 (×2): 81 mg via ORAL
  Filled 2018-03-30 (×2): qty 1

## 2018-03-30 MED ORDER — DORZOLAMIDE HCL 2 % OP SOLN
1.0000 [drp] | Freq: Three times a day (TID) | OPHTHALMIC | Status: DC
  Administered 2018-03-30 – 2018-03-31 (×5): 1 [drp] via OPHTHALMIC
  Filled 2018-03-30: qty 10

## 2018-03-30 MED ORDER — ENOXAPARIN SODIUM 40 MG/0.4ML ~~LOC~~ SOLN
40.0000 mg | SUBCUTANEOUS | Status: DC
  Administered 2018-03-30: 40 mg via SUBCUTANEOUS
  Filled 2018-03-30 (×2): qty 0.4

## 2018-03-30 MED ORDER — PANTOPRAZOLE SODIUM 40 MG PO TBEC
40.0000 mg | DELAYED_RELEASE_TABLET | Freq: Every day | ORAL | Status: DC
  Administered 2018-03-30 – 2018-03-31 (×2): 40 mg via ORAL
  Filled 2018-03-30 (×2): qty 1

## 2018-03-30 MED ORDER — TRAMADOL HCL 50 MG PO TABS: 50.0000 mg | ORAL_TABLET | Freq: Every day | ORAL | Status: DC | PRN

## 2018-03-30 MED ORDER — ONDANSETRON HCL 4 MG PO TABS: 4.0000 mg | ORAL_TABLET | Freq: Four times a day (QID) | ORAL | Status: DC | PRN

## 2018-03-30 MED ORDER — AMLODIPINE BESYLATE 5 MG PO TABS
5.0000 mg | ORAL_TABLET | Freq: Every day | ORAL | Status: DC
  Administered 2018-03-30 – 2018-03-31 (×2): 5 mg via ORAL
  Filled 2018-03-30 (×2): qty 1

## 2018-03-30 MED ORDER — ACETAMINOPHEN 325 MG PO TABS: 650.0000 mg | ORAL_TABLET | Freq: Four times a day (QID) | ORAL | Status: DC | PRN

## 2018-03-30 MED ORDER — ACETAMINOPHEN 650 MG RE SUPP: 650.0000 mg | Freq: Four times a day (QID) | RECTAL | Status: DC | PRN

## 2018-03-30 NOTE — Progress Notes (Signed)
PROGRESS NOTE  JUNE RODE DJM:[id number] DOB: [birth year] DOA: 03/29/2018 PCP: Lillard Anes, MD  HPI/Brief Narrative  [name] is a 84 y.o. year old female with medical history significant for CAD (status post DES to RCA 04/2016), hypertension chronic hypercalcemia , known pancreatic mass, hyperlipidemia who presented on 03/29/2018 with 1 day of acute onset abdominal pain with associated nausea .  The patient is unable to give a clear story stating that she just had "not been getting around too good" lately, for the last 2 to 3 weeks.  She does live alone.  I spoke with her daughter [name] over the phone who reports her mom was in her typical state of health when she suddenly developed abdominal pain without nausea or vomiting.  She states it was shortly after eating a fish meal from a restaurant.  She recently moved her mother and saw that she had broken out into a sweat so quickly brought her to the emergency department.   Had any vomiting, chest pain, shortness of breath, changes in medications, fevers or chills, recent sick contacts.  In the ED she was afebrile, respiratory rate 17-22, normal oxygen saturation, initial heart rate 54 but improved to 63, blood pressure range 133/99-109/61.  Lactic acid was 0.8.  BMP significant for creatinine of 1.8, BUN 38, potassium 5.4, calcium 11.5. WBC 12.2, hemoglobin 10.8.  Ionized calcium 1.72, lipase 19.  CT abdomen showed distended stomach with ingested contents likely representative of recent p.o. ingestion versus gastroparesis with no small bowel dilatation, diffusely fluid-filled colon suggesting diarrheal process, equivocal areas of colonic wall thickening of the transverse colon, possible colitis. [facility] was called for further evaluation and admission.   Subjective Doing well this morning.  Denies any current abdominal pain, no nausea no vomiting Did ok with clears  Assessment/Plan:  #Abdominal Pain,  improved.  Unclear etiology.  CT does mention some equivocal areas in the colon that could represent colitis however patient has remained without fever and improved with almost 1 dosing of antibiotics in the ED.  Additionally given clinical presentation with diaphoresis and nausea some concern for atypical presentation of cardiac disease especially given CAD history, monitor on telemetry, obtain EKG, obtain troponin, UA still pending.  We will continue to monitor off antibiotics continue supportive care, advance diet as tolerates, IV antiemetics as needed.  #AKI, slightly improving.  Previous baseline creatinine 1.2-1.3, currently 1.65 improved with admission of 1.8.  Suspect prerenal due to diminished p.o. intake, continue to monitor BMP.  Holding ramipril  #Acute on chronic hypercalcemia, improving.  12.8 on admission, improved 11.5 with IV fluids, continue fluid cessation.  Pending PTH.  Ionized calcium also elevated.  #Sinus Bradycardia?  Brief episode in ED, now with normal heart rate, will continue home Toprol continue to monitor on telemetry  #CAD.  Status post DES (to RCA, 04/2016).  Currently chest pain-free as well as no abdominal pain.  Continue aspirin  (no longer on statin due to elevated liver enzymes in the past).  Brilinta stopped atoutpatient cardiology visit on 07/2017.  Will discontinue here followed by Dr. Minus Breeding  #Hypertension, at goal.  SBP's in the 100s upon admission but improved with IV fluids.  Continue home amlodipine 5 mg and Toprol.  Holding ramipril  #Hyperlipidemia.  Not tolerant of statins.  Her primary cardiologist recommends no change in therapy  #Known pancreatic mass.  Due for repeat MRI with and without in 06/2018.     Cultures:  None  Telemetry: Yes  DVT prophylaxis: Lovenox Consultants:  None    Procedures:  None  Antimicrobials: 12/15 IV Flagyl, IV ceftriaxone  Code Status: Full code  Family Communication: Updated daughter by phone,  [name], [phone]  Disposition Plan: Continue to monitor over the next 24 hours, pending troponin, continue IV fluids to ensure stability of calcium, improving AKI, better management of abdominal pain.        Objective: Vitals:   03/30/18 0000 03/30/18 0015 03/30/18 0045 03/30/18 0800  BP: 140/66 (!) 144/68 (!) 144/70 109/61  Pulse: 68 68 66 63  Resp: 12 11 10 17   SpO2: 96% 96% 95% 96%    Intake/Output Summary (Last 24 hours) at 03/30/2018 1015 Last data filed at 03/30/2018 0022 Gross per 24 hour  Intake 500 ml  Output -  Net 500 ml   There were no vitals filed for this visit.  Exam:  Constitutional: Thin elderly female, no distress Eyes: EOMI, anicteric, normal conjunctivae ENMT: Oropharynx with moist mucous membranes, normal dentition Cardiovascular: RRR no MRGs, with no peripheral edema Respiratory: Normal respiratory effort on room air, clear breath sounds  Abdomen: Soft,non-tender with deep palpation, minimal bowel sounds Skin: No rash ulcers, or lesions. Without skin tenting  Neurologic: Grossly no focal neuro deficit. Psychiatric:Appropriate affect, and mood. Mental status AAOx3  Data Reviewed: CBC: Recent Labs  Lab 03/29/18 2025 03/29/18 2034 03/30/18 0316  WBC 11.7*  --  12.2*  NEUTROABS 8.5*  --   --   HGB 12.5 13.9 10.8*  HCT 41.4 41.0 37.0  MCV 101.0*  --  103.1*  PLT 195  --  465   Basic Metabolic Panel: Recent Labs  Lab 03/29/18 2025 03/29/18 2034 03/30/18 0316  NA 141 141 144  K 5.4* 5.4* 4.9  CL 109 114* 111  CO2 25  --  20*  GLUCOSE 106* 101* 160*  BUN 37* 38* 37*  CREATININE 1.75* 1.80* 1.65*  CALCIUM 12.8*  --  11.5*   GFR: CrCl cannot be calculated (Unknown ideal weight.). Liver Function Tests: Recent Labs  Lab 03/29/18 2025  AST 30  ALT 16  ALKPHOS 139*  BILITOT 0.7  PROT 8.6*  ALBUMIN 4.3   Recent Labs  Lab 03/29/18 2025  LIPASE 19   No results for input(s): AMMONIA in the last 168  hours. Coagulation Profile: No results for input(s): INR, PROTIME in the last 168 hours. Cardiac Enzymes: No results for input(s): CKTOTAL, CKMB, CKMBINDEX, TROPONINI in the last 168 hours. BNP (last 3 results) No results for input(s): PROBNP in the last 8760 hours. HbA1C: No results for input(s): HGBA1C in the last 72 hours. CBG: No results for input(s): GLUCAP in the last 168 hours. Lipid Profile: No results for input(s): CHOL, HDL, LDLCALC, TRIG, CHOLHDL, LDLDIRECT in the last 72 hours. Thyroid Function Tests: No results for input(s): TSH, T4TOTAL, FREET4, T3FREE, THYROIDAB in the last 72 hours. Anemia Panel: No results for input(s): VITAMINB12, FOLATE, FERRITIN, TIBC, IRON, RETICCTPCT in the last 72 hours. Urine analysis:    Component Value Date/Time   COLORURINE YELLOW 03/11/2014 0510   APPEARANCEUR CLEAR 03/11/2014 0510   LABSPEC 1.005 03/11/2014 0510   PHURINE 6.5 03/11/2014 0510   GLUCOSEU NEGATIVE 03/11/2014 0510   HGBUR NEGATIVE 03/11/2014 0510   BILIRUBINUR NEGATIVE 03/11/2014 0510   KETONESUR NEGATIVE 03/11/2014 0510   PROTEINUR NEGATIVE 03/11/2014 0510   UROBILINOGEN 0.2 03/11/2014 0510   NITRITE NEGATIVE 03/11/2014 0510   LEUKOCYTESUR NEGATIVE 03/11/2014 0510   Sepsis Labs: @LABRCNTIP (procalcitonin:4,lacticidven:4)  )  No results found for this or any previous visit (from the past 240 hour(s)).    Studies: Ct Abdomen Pelvis Wo Contrast  Result Date: 03/29/2018 CLINICAL DATA:  Abdominal pain. Patient reports pain after eating with nausea. EXAM: CT ABDOMEN AND PELVIS WITHOUT CONTRAST TECHNIQUE: Multidetector CT imaging of the abdomen and pelvis was performed following the standard protocol without IV contrast. COMPARISON:  Abdominal CT 06/23/2016, CT 03/11/2014 FINDINGS: Lower chest: Clustered nodular densities in the medial right lower lobe, adjacent nodules versus bilobed nodule measuring 13 x 7 mm, image 2 series 4. Streaky micronodular densities present on  prior exam. No pleural fluid. There are coronary artery calcifications. Hepatobiliary: No focal hepatic lesion on noncontrast exam. Gallbladder physiologically distended, no calcified stone. Common bile duct not well-defined in the absence of IV contrast. Pancreas: Cystic pancreatic lesions on prior contrast-enhanced exam are not well seen currently, there is vague low-density in this region for example image 16 series 3. No evidence of peripancreatic inflammation. Spleen: Normal in size without focal abnormality. Adrenals/Urinary Tract: Low-density adrenal thickening, similar to prior exam. Extrarenal pelvis configuration of the right kidney as before. No perinephric edema or hydronephrosis. Simple cyst in the anterior left kidney again seen. Urinary bladder is physiologically distended. No bladder wall thickening. No urolithiasis. Stomach/Bowel: Bowel evaluation is limited in the absence of contrast and paucity of intra-abdominal fat. Stomach is distended with fluid/ingested contents. No definite small bowel dilatation, the colon appears diffusely fluid-filled. Possible areas of colonic wall thickening involving the transverse colon, image 37 series 3, suboptimally assessed. Sigmoid colonic tortuosity with minimal diverticulosis. No diverticulitis. There is pelvic floor laxity. Vascular/Lymphatic: Aortic atherosclerosis and tortuosity. No aneurysm. Limited assessment for adenopathy given paucity of body fat lack contrast. Reproductive: Slightly prominent uterus for age, but unchanged from 84 CT. There is pelvic floor laxity. Other: No ascites or free air. Musculoskeletal: Moderate to severe scoliosis and degenerative change throughout spine. Bones appear under mineralized. No acute osseous abnormality. Surgical clips in the left inguinal region. IMPRESSION: 1. Stomach distended with ingested contents, this may represent recent p.o. ingestion versus gastroparesis. No small bowel dilatation. 2. Colon is diffusely  fluid-filled, suggesting diarrheal process. Equivocal areas of colonic wall thickening of the transverse colon, possible colitis. 3. Clustered nodular densities in the medial right lower lobe, previous micronodularity in this region. Findings are indeterminate for pulmonary nodules versus bronchiectasis with mucoid impaction. The extent of nodularity is not included in the abdominal field of view, recommend chest CT for further characterization. 4.  Aortic Atherosclerosis (ICD10-I70.0). 5. Multiple chronic findings are grossly unchanged from prior exam, not as well assessed currently in the absence of contrast and paucity of intra-abdominal fat. Electronically Signed   By: Keith Rake M.D.   On: 03/29/2018 22:59    Scheduled Meds: . amLODipine  5 mg Oral Daily  . aspirin  81 mg Oral Daily  . brimonidine  1 drop Both Eyes TID  . dorzolamide  1 drop Both Eyes TID  . enoxaparin (LOVENOX) injection  40 mg Subcutaneous Q24H  . latanoprost  1 drop Both Eyes QHS  . metoprolol succinate  75 mg Oral Daily  . pantoprazole  40 mg Oral Daily  . ticagrelor  90 mg Oral BID  . timolol  1 drop Both Eyes BID    Continuous Infusions: . sodium chloride 100 mL/hr at 03/30/18 0133  . cefTRIAXone (ROCEPHIN)  IV Stopped (03/30/18 0123)  . metronidazole 500 mg (03/30/18 0802)     LOS: 0 days  Desiree Hane, MD [facility] Pager [phone]  If 7PM-7AM, please contact night-coverage [email or web address] Password [email or web address] 03/30/2018, 10:15 AM

## 2018-03-30 NOTE — ED Notes (Signed)
Breakfast tray ordered 

## 2018-03-30 NOTE — H&P (Signed)
History and Physical    [name] YHC:[id number] DOB: 1925/12/14 DOA: 03/29/2018  PCP: Lillard Anes, MD  Patient coming from: Home  I have personally briefly reviewed patient's old medical records in [place]  Chief Complaint: Abd pain  HPI: [name] is a 84 y.o. female with medical history significant of CAD, HTN, TIA, ischemic colitis in 2012 resolved with conservative management.  Patient presents to the ED with sudden onset of abd pain after eating dinner this evening.  Associated nausea, no vomiting, has had 1 episode of loose stool since arrival to ED.  Pain 5/10 initially, initially hypotensive, but 517 systolic on arrival to ED after just 250cc NS en route with EMS.   ED Course: CT shows food in stomach.  Suspicion of transverse colitis with liquid stool.  Prior cystic lesion(s) of pancreas arnt well seen on this study.  Calcium of 12.8, baseline 10-11 since at least 2010.  Creat 1.8, baseline 1.  K 5.4.   Review of Systems: As per HPI otherwise 10 point review of systems negative.   Past Medical History:  Diagnosis Date  . Abscess 2003   I&D SCM abscess  . Acute ischemic colitis ([place]) 04/10/11   acute episode of ischemic colitis, with abd pain, CT evidence of transverse colitis, and blood streaked BM.  Resolved with conservative treatment.  . Arthritis    "hands, joints, knees" (07/19/2016)  . Bacteremia 2003   hx of staph bacteremia  . CAD (coronary artery disease) 2003, 2010   s/p stenting of RCA x2, s/p stent to OM;  LHC 04/2009 in the setting of a NSTEMI: EF 65%, RCA stent patent with 60% ISR proximal and 40% ISR mid, PLV proximal 50-60% and distal 50%, ostial circumflex 30-40%, proximal OM mild in-stent restenosis with a branch with 90+ percent ostial stenosis, proximal LAD 30-40%.  FFR of ostial circumflex:  0.96 - not flow limiting.  Medical therapy was continued.  . Chronic lower back pain    "not a big problem" (07/19/2016)  .  Closed displaced fracture of distal phalanx of left middle finger 05/23/2017  . Degenerative disc disease   . GERD (gastroesophageal reflux disease)   . History of medication noncompliance    concerning Plavix  . HTN (hypertension)    Echocardiogram 08/26/11: Mild focal basal septal hypertrophy, EF 61-60%, grade 2 diastolic dysfunction, mild MR.  There was a fluid-filled area likely in the location of the liver noted  . Hyperlipidemia   . Hyperparathyroidism 2003   parathyroid adenoma localized to the right inferior thyroid lobe region  . Hypothyroidism   . Left renal artery stenosis (HCC)    40%  . Lumbar stenosis with neurogenic claudication 2006   s/p decompression L2-L5  . Myocardial infarction [facility]) ~ 2013; 05/06/2016  . Osteoporosis 2009   diagnosed by bone density scan in 2009, on Vit D only, no bisphosphonate therapy  . Phlebitis    LLE  . PVD (peripheral vascular disease) (HCC)    s/p left fem-pop bypass graft  . TIA (transient ischemic attack)    carotid dopplers 5/13: no ICA stenosis    Past Surgical History:  Procedure Laterality Date  . AMPUTATION FINGER Left 05/24/2017   Procedure: AMPUTATION tip of long FINGER;  Surgeon: Dayna Barker, MD;  Location: [place];  Service: Plastics;  Laterality: Left;  . CARDIAC CATHETERIZATION N/A 05/06/2016   Procedure: Left Heart Cath and Coronary Angiography;  Surgeon: Lorretta Harp, MD;  Location: [facility]  INVASIVE CV LAB;  Service: Cardiovascular;  Laterality: N/A;  . CARDIAC CATHETERIZATION N/A 05/06/2016   Procedure: Coronary Stent Intervention;  Surgeon: Lorretta Harp, MD;  Location: [place] CV LAB;  Service: Cardiovascular;  Laterality: N/A;  RCA  . CATARACT EXTRACTION, BILATERAL Bilateral   . CORONARY ANGIOPLASTY WITH STENT PLACEMENT  09/2008  . CORONARY ANGIOPLASTY WITH STENT PLACEMENT  04/2001  . ENDOSCOPIC HEMILAMINOTOMY W/ DISCECTOMY LUMBAR  06/2004   L3, L4 lami; L2, L5 hemilami; decompresion L2-3, 3-4, 4-  . FEMORAL BYPASS   before 2003   LLE  . I&D EXTREMITY Left 05/24/2017   Procedure: IRRIGATION AND DEBRIDEMENT LEFT LONG FINGER;  Surgeon: Dayna Barker, MD;  Location: [place];  Service: Plastics;  Laterality: Left;  . INCISION AND DRAINAGE ANTERIOR NECK  10/2001   absces sternoclavicular joint     reports that she has never smoked. She has never used smokeless tobacco. She reports that she does not drink alcohol or use drugs.  Allergies  Allergen Reactions  . Diphenhydramine Hcl Other (See Comments)    unknown  . Penicillins Other (See Comments)    unknown  . Shellfish Allergy Other (See Comments)    unknown    Family History  Problem Relation Age of Onset  . Stroke Mother   . Stroke Father      Prior to Admission medications   Medication Sig Start Date End Date Taking? Authorizing Provider  amLODipine (NORVASC) 5 MG tablet TAKE 1 TABLET BY MOUTH EVERY DAY Patient taking differently: Take 5 mg by mouth daily.  11/13/17  Yes Minus Breeding, MD  aspirin 81 MG tablet Take 1 tablet (81 mg total) by mouth daily. 08/21/12  Yes Hochrein, Jeneen Rinks, MD  bimatoprost (LUMIGAN) 0.01 % SOLN Place 1 drop into both eyes at bedtime.    Yes [provider]  brimonidine (ALPHAGAN) 0.15 % ophthalmic solution Place 1 drop into the right eye 2 (two) times daily. Patient taking differently: Place 1 drop into both eyes 3 (three) times daily.  04/16/11  Yes Hester Mates, MD  Coenzyme Q10 (COQ10) 50 MG CAPS Take 50 mg by mouth daily.   Yes [provider]  dorzolamide (TRUSOPT) 2 % ophthalmic solution Place 1 drop into both eyes 3 (three) times daily.   Yes [provider]  metoprolol succinate (TOPROL-XL) 25 MG 24 hr tablet Take 3 tablets (75 mg total) by mouth daily. 10/02/17  Yes Minus Breeding, MD  Multiple Vitamins-Minerals (ADULT ONE DAILY GUMMIES) CHEW Chew 2 tablets by mouth daily.   Yes [provider]  nitroGLYCERIN (NITROSTAT) 0.4 MG SL tablet Place 1 tablet (0.4 mg total) under the  tongue every 5 (five) minutes as needed. For chest pain 07/29/17  Yes Hochrein, Jeneen Rinks, MD  pantoprazole (PROTONIX) 40 MG tablet TAKE 1 TABLET BY MOUTH EVERY DAY Patient taking differently: Take 40 mg by mouth daily.  01/19/18  Yes Minus Breeding, MD  ramipril (ALTACE) 10 MG capsule TAKE ONE CAPSULE BY MOUTH EVERY DAY Patient taking differently: Take 10 mg by mouth daily.  09/30/12  Yes Minus Breeding, MD  ticagrelor (BRILINTA) 90 MG TABS tablet Take 1 tablet (90 mg total) by mouth 2 (two) times daily. 06/16/17  Yes Minus Breeding, MD  timolol (BETIMOL) 0.5 % ophthalmic solution Place 1 drop into both eyes 2 (two) times daily.   Yes [provider]  traMADol (ULTRAM) 50 MG tablet Take 50 mg by mouth daily as needed (pain).    Yes [provider]  simvastatin (ZOCOR) 40 MG tablet Take 1 tablet (40 mg total) by mouth at bedtime. 04/16/11 06/20/11  Hester Mates, MD    Physical Exam: Vitals:   03/29/18 2330 03/29/18 2345 03/30/18 0000 03/30/18 0015  BP: (!) 145/65 (!) 141/68 140/66 (!) 144/68  Pulse: 66 66 68 68  Resp: 12 13 12 11   SpO2: 97% 97% 96% 96%    Constitutional: NAD, calm, comfortable Eyes: PERRL, lids and conjunctivae normal ENMT: Mucous membranes are moist. Posterior pharynx clear of any exudate or lesions.Normal dentition.  Neck: normal, supple, no masses, no thyromegaly Respiratory: clear to auscultation bilaterally, no wheezing, no crackles. Normal respiratory effort. No accessory muscle use.  Cardiovascular: Regular rate and rhythm, no murmurs / rubs / gallops. No extremity edema. 2+ pedal pulses. No carotid bruits.  Abdomen: no tenderness, no masses palpated. No hepatosplenomegaly. Bowel sounds positive.  Musculoskeletal: no clubbing / cyanosis. No joint deformity upper and lower extremities. Good ROM, no contractures. Normal muscle tone.  Skin: no rashes, lesions, ulcers. No induration Neurologic: CN 2-12 grossly intact. Sensation intact, DTR normal. Strength  5/5 in all 4.  Psychiatric: Normal judgment and insight. Alert and oriented x 3. Normal mood.    Labs on Admission: I have personally reviewed following labs and imaging studies  CBC: Recent Labs  Lab 03/29/18 2025 03/29/18 2034  WBC 11.7*  --   NEUTROABS 8.5*  --   HGB 12.5 13.9  HCT 41.4 41.0  MCV 101.0*  --   PLT 195  --    Basic Metabolic Panel: Recent Labs  Lab 03/29/18 2025 03/29/18 2034  NA 141 141  K 5.4* 5.4*  CL 109 114*  CO2 25  --   GLUCOSE 106* 101*  BUN 37* 38*  CREATININE 1.75* 1.80*  CALCIUM 12.8*  --    GFR: CrCl cannot be calculated (Unknown ideal weight.). Liver Function Tests: Recent Labs  Lab 03/29/18 2025  AST 30  ALT 16  ALKPHOS 139*  BILITOT 0.7  PROT 8.6*  ALBUMIN 4.3   Recent Labs  Lab 03/29/18 2025  LIPASE 19   No results for input(s): AMMONIA in the last 168 hours. Coagulation Profile: No results for input(s): INR, PROTIME in the last 168 hours. Cardiac Enzymes: No results for input(s): CKTOTAL, CKMB, CKMBINDEX, TROPONINI in the last 168 hours. BNP (last 3 results) No results for input(s): PROBNP in the last 8760 hours. HbA1C: No results for input(s): HGBA1C in the last 72 hours. CBG: No results for input(s): GLUCAP in the last 168 hours. Lipid Profile: No results for input(s): CHOL, HDL, LDLCALC, TRIG, CHOLHDL, LDLDIRECT in the last 72 hours. Thyroid Function Tests: No results for input(s): TSH, T4TOTAL, FREET4, T3FREE, THYROIDAB in the last 72 hours. Anemia Panel: No results for input(s): VITAMINB12, FOLATE, FERRITIN, TIBC, IRON, RETICCTPCT in the last 72 hours. Urine analysis:    Component Value Date/Time   COLORURINE YELLOW 03/11/2014 0510   APPEARANCEUR CLEAR 03/11/2014 0510   LABSPEC 1.005 03/11/2014 0510   PHURINE 6.5 03/11/2014 0510   GLUCOSEU NEGATIVE 03/11/2014 0510   HGBUR NEGATIVE 03/11/2014 0510   BILIRUBINUR NEGATIVE 03/11/2014 0510   KETONESUR NEGATIVE 03/11/2014 0510   PROTEINUR NEGATIVE  03/11/2014 0510   UROBILINOGEN 0.2 03/11/2014 0510   NITRITE NEGATIVE 03/11/2014 0510   LEUKOCYTESUR NEGATIVE 03/11/2014 0510    Radiological Exams on Admission: Ct Abdomen Pelvis Wo Contrast  Result Date: 03/29/2018 CLINICAL DATA:  Abdominal pain. Patient reports pain after eating with nausea. EXAM: CT ABDOMEN  AND PELVIS WITHOUT CONTRAST TECHNIQUE: Multidetector CT imaging of the abdomen and pelvis was performed following the standard protocol without IV contrast. COMPARISON:  Abdominal CT 06/23/2016, CT 03/11/2014 FINDINGS: Lower chest: Clustered nodular densities in the medial right lower lobe, adjacent nodules versus bilobed nodule measuring 13 x 7 mm, image 2 series 4. Streaky micronodular densities present on prior exam. No pleural fluid. There are coronary artery calcifications. Hepatobiliary: No focal hepatic lesion on noncontrast exam. Gallbladder physiologically distended, no calcified stone. Common bile duct not well-defined in the absence of IV contrast. Pancreas: Cystic pancreatic lesions on prior contrast-enhanced exam are not well seen currently, there is vague low-density in this region for example image 16 series 3. No evidence of peripancreatic inflammation. Spleen: Normal in size without focal abnormality. Adrenals/Urinary Tract: Low-density adrenal thickening, similar to prior exam. Extrarenal pelvis configuration of the right kidney as before. No perinephric edema or hydronephrosis. Simple cyst in the anterior left kidney again seen. Urinary bladder is physiologically distended. No bladder wall thickening. No urolithiasis. Stomach/Bowel: Bowel evaluation is limited in the absence of contrast and paucity of intra-abdominal fat. Stomach is distended with fluid/ingested contents. No definite small bowel dilatation, the colon appears diffusely fluid-filled. Possible areas of colonic wall thickening involving the transverse colon, image 37 series 3, suboptimally assessed. Sigmoid colonic  tortuosity with minimal diverticulosis. No diverticulitis. There is pelvic floor laxity. Vascular/Lymphatic: Aortic atherosclerosis and tortuosity. No aneurysm. Limited assessment for adenopathy given paucity of body fat lack contrast. Reproductive: Slightly prominent uterus for age, but unchanged from 60 CT. There is pelvic floor laxity. Other: No ascites or free air. Musculoskeletal: Moderate to severe scoliosis and degenerative change throughout spine. Bones appear under mineralized. No acute osseous abnormality. Surgical clips in the left inguinal region. IMPRESSION: 1. Stomach distended with ingested contents, this may represent recent p.o. ingestion versus gastroparesis. No small bowel dilatation. 2. Colon is diffusely fluid-filled, suggesting diarrheal process. Equivocal areas of colonic wall thickening of the transverse colon, possible colitis. 3. Clustered nodular densities in the medial right lower lobe, previous micronodularity in this region. Findings are indeterminate for pulmonary nodules versus bronchiectasis with mucoid impaction. The extent of nodularity is not included in the abdominal field of view, recommend chest CT for further characterization. 4.  Aortic Atherosclerosis (ICD10-I70.0). 5. Multiple chronic findings are grossly unchanged from prior exam, not as well assessed currently in the absence of contrast and paucity of intra-abdominal fat. Electronically Signed   By: Keith Rake M.D.   On: 03/29/2018 22:59    EKG: Independently reviewed.  Assessment/Plan Principal Problem:   Colitis, acute Active Problems:   CAD (coronary artery disease), native coronary artery   Pancreatic mass   History of ischemic colitis   Hypercalcemia   AKI (acute kidney injury) ([place])    1. Colitis of transverse colon - infectious vs ischemic, h/o ischemic colitis of transverse colon previously in 2012 that resolved with conservative management 1. Patient already feeling significantly  better 2. Will put patient on clears 3. Got rocephin / flagyl in ED, will hold off on ordering further ABx for the moment and defer decision on continuing these to day team. 4. Watch for SIRS 5. Repeat CBC in AM 2. AKI -  1. IVF: 500cc bolus in ED and NS at 100 cc/hr 2. Intake and output 3. Repeat BMP in AM 3. Hypercalcemia - 1. Acute on chronic, acute component likely due to dehydration 2. Chronic component due to hyperparathyroidism, h/o parathyroid adenoma 3. Will recheck PTH since  its been the better part of a decade since this has been checked 4. But Ca levels have been running high since at least 2010 (beyond which I dont have labs). 4. Pancreatic mass - 1. h/o cystic masses of pancreas, not well seen on todays CT scan but sounds like they havent really been doing a whole lot it sounds like in the past ~15 years or so (I see these being called as far back as 2005 on CT) 2. Due for repeat MRI w and w/o contrast in March 2020 5. CAD - continue ASA, brilinta 6. HTN - continue toprol, norvasc, holding altace  DVT prophylaxis: Lovenox Code Status: Full Family Communication: Family at bedside Disposition Plan: Home after admit Consults called: None Admission status: Place in [place], [facility] Pager [phone] Only works nights!  If 7AM-7PM, please contact the primary day team physician taking care of patient  [email or web address] Password [email or web address]  03/30/2018, 12:52 AM

## 2018-03-30 NOTE — ED Notes (Signed)
Patient informed of need of urine specimen. States she will give one when she gets up in the morning.

## 2018-03-31 DIAGNOSIS — R1084 Generalized abdominal pain: Secondary | ICD-10-CM

## 2018-03-31 DIAGNOSIS — K8689 Other specified diseases of pancreas: Secondary | ICD-10-CM | POA: Diagnosis not present

## 2018-03-31 DIAGNOSIS — K529 Noninfective gastroenteritis and colitis, unspecified: Secondary | ICD-10-CM | POA: Diagnosis not present

## 2018-03-31 DIAGNOSIS — E213 Hyperparathyroidism, unspecified: Secondary | ICD-10-CM

## 2018-03-31 DIAGNOSIS — I2510 Atherosclerotic heart disease of native coronary artery without angina pectoris: Secondary | ICD-10-CM | POA: Diagnosis not present

## 2018-03-31 DIAGNOSIS — N179 Acute kidney failure, unspecified: Secondary | ICD-10-CM

## 2018-03-31 LAB — COMPREHENSIVE METABOLIC PANEL
ALT: 11 U/L (ref 0–44)
AST: 20 U/L (ref 15–41)
Albumin: 2.9 g/dL — ABNORMAL LOW (ref 3.5–5.0)
Alkaline Phosphatase: 85 U/L (ref 38–126)
Anion gap: 8 (ref 5–15)
BUN: 20 mg/dL (ref 8–23)
CO2: 21 mmol/L — ABNORMAL LOW (ref 22–32)
Calcium: 10.8 mg/dL — ABNORMAL HIGH (ref 8.9–10.3)
Chloride: 113 mmol/L — ABNORMAL HIGH (ref 98–111)
Creatinine, Ser: 1.29 mg/dL — ABNORMAL HIGH (ref 0.44–1.00)
GFR calc Af Amer: 42 mL/min — ABNORMAL LOW (ref >60)
GFR calc non Af Amer: 36 mL/min — ABNORMAL LOW (ref >60)
Glucose, Bld: 89 mg/dL (ref 70–99)
Potassium: 4.1 mmol/L (ref 3.5–5.1)
Sodium: 142 mmol/L (ref 135–145)
Total Bilirubin: 0.4 mg/dL (ref 0.3–1.2)
Total Protein: 5.7 g/dL — ABNORMAL LOW (ref 6.5–8.1)

## 2018-03-31 LAB — PTH, INTACT AND CALCIUM
Calcium, Total (PTH): 12.1 mg/dL — ABNORMAL HIGH (ref 8.7–10.3)
PTH: 76 pg/mL — ABNORMAL HIGH (ref 15–65)

## 2018-03-31 LAB — CBC
HCT: 31.7 % — ABNORMAL LOW (ref 36.0–46.0)
Hemoglobin: 9.6 g/dL — ABNORMAL LOW (ref 12.0–15.0)
MCH: 30.2 pg (ref 26.0–34.0)
MCHC: 30.3 g/dL (ref 30.0–36.0)
MCV: 99.7 fL (ref 80.0–100.0)
PLATELETS: 133 10*3/uL — AB (ref 150–400)
RBC: 3.18 MIL/uL — ABNORMAL LOW (ref 3.87–5.11)
RDW: 12.9 % (ref 11.5–15.5)
WBC: 11.4 10*3/uL — ABNORMAL HIGH (ref 4.0–10.5)
nRBC: 0 % (ref 0.0–0.2)

## 2018-03-31 LAB — VITAMIN D 25 HYDROXY (VIT D DEFICIENCY, FRACTURES): Vit D, 25-Hydroxy: 23.7 ng/mL — ABNORMAL LOW (ref 30.0–100.0)

## 2018-03-31 MED ORDER — CEFDINIR 300 MG PO CAPS: 300.0000 mg | ORAL_CAPSULE | Freq: Two times a day (BID) | ORAL | 0 refills | Status: AC

## 2018-03-31 NOTE — Care Management Obs Status (Signed)
[place] NOTIFICATION   Patient Details  Name: [name] MRN: [id number] Date of Birth: [birth year]   Medicare Observation Status Notification Given:  Yes    Midge Minium RN, BSN, NCM-BC, ACM-RN [phone] 03/31/2018, 1:12 PM

## 2018-03-31 NOTE — Discharge Summary (Signed)
Discharge Summary  RIM THATCH WEX:[id number] DOB: 04-24-1925  PCP: Cindy Anes, MD  Admit date: 03/29/2018 Discharge date: 03/31/2018   Time spent: < 25 minutes  Admitted From: home Disposition:  home  Recommendations for Outpatient Follow-up:  1. Follow up with PCP in 1 week, CMP check ( Cr, Ca) 2. Cefdinir x 5 days 3.        Discharge Diagnoses:  Active Hospital Problems   Diagnosis Date Noted  . Colitis, acute 03/30/2018  . History of ischemic colitis 03/30/2018  . Hypercalcemia 03/30/2018  . AKI (acute kidney injury) ([place]) 03/30/2018  . Pancreatic mass   . CAD (coronary artery disease), native coronary artery   . Hypertension     Resolved Hospital Problems  No resolved problems to display.    Discharge Condition: Stable   CODE STATUS:FULL   History of present illness:  [name] is a 84 y.o. year old female with medical history significant for CAD (status post DES to RCA 04/2016), hypertension chronic hypercalcemia , known pancreatic mass, hyperlipidemia who presented on 03/29/2018 with 1 day of acute onset abdominal pain with associated nausea .  The patient is unable to give a clear story stating that she just had "not been getting around too good" lately, for the last 2 to 3 weeks.  She does live alone.  I spoke with her daughter [name] over the phone who reports her mom was in her typical state of health when she suddenly developed abdominal pain without nausea or vomiting.  She states it was shortly after eating a fish meal from a restaurant.  She went to see her mother and saw that she had broken out into a sweat so quickly brought her to the emergency department for further evaluation.   No vomiting, chest pain, shortness of breath, changes in medications, fevers or chills, recent sick contacts.  In the ED she was afebrile, respiratory rate 17-22, normal oxygen saturation, initial heart rate 54 but improved to 63, blood  pressure range 133/99-109/61.  Lactic acid was 0.8.  BMP significant for creatinine of 1.8, BUN 38, potassium 5.4, calcium 11.5. WBC 12.2, hemoglobin 10.8.  Ionized calcium 1.72, lipase 19.  CT abdomen showed distended stomach with ingested contents likely representative of recent p.o. ingestion versus gastroparesis with no small bowel dilatation, diffusely fluid-filled colon suggesting diarrheal process, equivocal areas of colonic wall thickening of the transverse colon, possible colitis. [facility] hospitalist was called for further evaluation and admission. Remaining hospital course addressed in problem based format below:   Hospital Course:   Abdominal pain presumed related to  colitis.  Quickly improved upon initiation of IV Flagyl and ceftriaxone in the ED which was continued for 24 hours.  She was able to transition from a liquid diet to regular diet during that observation.  Cardiac disease masking as abdominal pain ruled out with normal troponin and baseline EKG.  She was transitioned to cefdinir which she will complete for additional 5 days, instructions provided to patient and her daughter.  AKI, back to baseline.  Upon presentation peak of 1.8.  This improved back to her baseline of 1.21.2-1.3 with fluid resuscitation.  Likely all prerenal due to diminished p.o. intake related to abdominal pain.  Her ramipril was held during hospital stay but she can continue on discharge.  Acute on chronic hypercalcemia, secondary to hyperparathyroidism .  On admission her calcium was 12.8 with fluid sensation that improved 1010.5.  Elevated PTH elevated PTH consistent with hyperparathyroidism.  She was previously seen by endocrinology several years ago per discussion with daughter.  They would like to continue to monitor and not interested in any parathyroidectomy (I explained the risk and benefits of such)  Known pancreatic mass.  This is been seen on previous imaging studies.  Found again on CT abdomen  imaging.  Due for repeat MRI with and without on 3/20.  Consultations:  None  Procedures/Studies: None  Discharge Exam: BP (!) 113/58 (BP Location: Left Arm)   Pulse 63   Temp 98.2 F (36.8 C) (Oral)   Resp 20   Ht 5\' 1"  (1.549 m)   Wt 43.1 kg   SpO2 100%   BMI 17.95 kg/m   General: Thin elderly female lying in bed, no apparent distress Eyes: EOMI, anicteric ENT: Oral Mucosa clear and moist Cardiovascular: regular rate and rhythm, no murmurs, rubs or gallops, no edema, Respiratory: Normal respiratory effort, lungs clear to auscultation bilaterally Abdomen: soft, non-distended, non-tender with deep palpation, normal bowel sounds Skin: No Rash Neurologic: Grossly no focal neuro deficit.Mental status AAOx3, speech normal, Psychiatric:Appropriate affect, and mood   Discharge Instructions You were cared for by a hospitalist during your hospital stay. If you have any questions about your discharge medications or the care you received while you were in the hospital after you are discharged, you can call the unit and asked to speak with the hospitalist on call if the hospitalist that took care of you is not available. Once you are discharged, your primary care physician will handle any further medical issues. Please note that NO REFILLS for any discharge medications will be authorized once you are discharged, as it is imperative that you return to your primary care physician (or establish a relationship with a primary care physician if you do not have one) for your aftercare needs so that they can reassess your need for medications and monitor your lab values.  Discharge Instructions    Diet - low sodium heart healthy   Complete by:  As directed    Increase activity slowly   Complete by:  As directed      Allergies as of 03/31/2018      Reactions   Diphenhydramine Hcl Other (See Comments)   unknown   Penicillins Other (See Comments)   unknown   Shellfish Allergy Other (See  Comments)   unknown      Medication List    TAKE these medications   ADULT ONE DAILY GUMMIES Chew Chew 2 tablets by mouth daily.   amLODipine 5 MG tablet Commonly known as:  NORVASC TAKE 1 TABLET BY MOUTH EVERY DAY   aspirin 81 MG tablet Take 1 tablet (81 mg total) by mouth daily.   bimatoprost 0.01 % Soln Commonly known as:  LUMIGAN Place 1 drop into both eyes at bedtime.   brimonidine 0.15 % ophthalmic solution Commonly known as:  ALPHAGAN Place 1 drop into the right eye 2 (two) times daily. What changed:    how to take this  when to take this   cefdinir 300 MG capsule Commonly known as:  OMNICEF Take 1 capsule (300 mg total) by mouth 2 (two) times daily for 5 days.   CoQ10 50 MG Caps Take 50 mg by mouth daily.   dorzolamide 2 % ophthalmic solution Commonly known as:  TRUSOPT Place 1 drop into both eyes 3 (three) times daily.   metoprolol succinate 25 MG 24 hr tablet Commonly known as:  TOPROL-XL Take 3 tablets (75 mg total)  by mouth daily.   nitroGLYCERIN 0.4 MG SL tablet Commonly known as:  NITROSTAT Place 1 tablet (0.4 mg total) under the tongue every 5 (five) minutes as needed. For chest pain   pantoprazole 40 MG tablet Commonly known as:  PROTONIX TAKE 1 TABLET BY MOUTH EVERY DAY   ramipril 10 MG capsule Commonly known as:  ALTACE TAKE ONE CAPSULE BY MOUTH EVERY DAY   ticagrelor 90 MG Tabs tablet Commonly known as:  BRILINTA Take 1 tablet (90 mg total) by mouth 2 (two) times daily.   timolol 0.5 % ophthalmic solution Commonly known as:  BETIMOL Place 1 drop into both eyes 2 (two) times daily.   traMADol 50 MG tablet Commonly known as:  ULTRAM Take 50 mg by mouth daily as needed (pain).      Allergies  Allergen Reactions  . Diphenhydramine Hcl Other (See Comments)    unknown  . Penicillins Other (See Comments)    unknown  . Shellfish Allergy Other (See Comments)    unknown      The results of significant diagnostics from this  hospitalization (including imaging, microbiology, ancillary and laboratory) are listed below for reference.    Significant Diagnostic Studies: Ct Abdomen Pelvis Wo Contrast  Result Date: 03/29/2018 CLINICAL DATA:  Abdominal pain. Patient reports pain after eating with nausea. EXAM: CT ABDOMEN AND PELVIS WITHOUT CONTRAST TECHNIQUE: Multidetector CT imaging of the abdomen and pelvis was performed following the standard protocol without IV contrast. COMPARISON:  Abdominal CT 06/23/2016, CT 03/11/2014 FINDINGS: Lower chest: Clustered nodular densities in the medial right lower lobe, adjacent nodules versus bilobed nodule measuring 13 x 7 mm, image 2 series 4. Streaky micronodular densities present on prior exam. No pleural fluid. There are coronary artery calcifications. Hepatobiliary: No focal hepatic lesion on noncontrast exam. Gallbladder physiologically distended, no calcified stone. Common bile duct not well-defined in the absence of IV contrast. Pancreas: Cystic pancreatic lesions on prior contrast-enhanced exam are not well seen currently, there is vague low-density in this region for example image 16 series 3. No evidence of peripancreatic inflammation. Spleen: Normal in size without focal abnormality. Adrenals/Urinary Tract: Low-density adrenal thickening, similar to prior exam. Extrarenal pelvis configuration of the right kidney as before. No perinephric edema or hydronephrosis. Simple cyst in the anterior left kidney again seen. Urinary bladder is physiologically distended. No bladder wall thickening. No urolithiasis. Stomach/Bowel: Bowel evaluation is limited in the absence of contrast and paucity of intra-abdominal fat. Stomach is distended with fluid/ingested contents. No definite small bowel dilatation, the colon appears diffusely fluid-filled. Possible areas of colonic wall thickening involving the transverse colon, image 37 series 3, suboptimally assessed. Sigmoid colonic tortuosity with minimal  diverticulosis. No diverticulitis. There is pelvic floor laxity. Vascular/Lymphatic: Aortic atherosclerosis and tortuosity. No aneurysm. Limited assessment for adenopathy given paucity of body fat lack contrast. Reproductive: Slightly prominent uterus for age, but unchanged from 37 CT. There is pelvic floor laxity. Other: No ascites or free air. Musculoskeletal: Moderate to severe scoliosis and degenerative change throughout spine. Bones appear under mineralized. No acute osseous abnormality. Surgical clips in the left inguinal region. IMPRESSION: 1. Stomach distended with ingested contents, this may represent recent p.o. ingestion versus gastroparesis. No small bowel dilatation. 2. Colon is diffusely fluid-filled, suggesting diarrheal process. Equivocal areas of colonic wall thickening of the transverse colon, possible colitis. 3. Clustered nodular densities in the medial right lower lobe, previous micronodularity in this region. Findings are indeterminate for pulmonary nodules versus bronchiectasis with mucoid impaction. The extent  of nodularity is not included in the abdominal field of view, recommend chest CT for further characterization. 4.  Aortic Atherosclerosis (ICD10-I70.0). 5. Multiple chronic findings are grossly unchanged from prior exam, not as well assessed currently in the absence of contrast and paucity of intra-abdominal fat. Electronically Signed   By: Keith Rake M.D.   On: 03/29/2018 22:59    Microbiology: No results found for this or any previous visit (from the past 240 hour(s)).   Labs: Basic Metabolic Panel: Recent Labs  Lab 03/29/18 2025 03/29/18 2034 03/30/18 0102 03/30/18 0316 03/31/18 0655  NA 141 141  --  144 142  K 5.4* 5.4*  --  4.9 4.1  CL 109 114*  --  111 113*  CO2 25  --   --  20* 21*  GLUCOSE 106* 101*  --  160* 89  BUN 37* 38*  --  37* 20  CREATININE 1.75* 1.80*  --  1.65* 1.29*  CALCIUM 12.8*  --  12.1* 11.5* 10.8*   Liver Function Tests: Recent  Labs  Lab 03/29/18 2025 03/31/18 0655  AST 30 20  ALT 16 11  ALKPHOS 139* 85  BILITOT 0.7 0.4  PROT 8.6* 5.7*  ALBUMIN 4.3 2.9*   Recent Labs  Lab 03/29/18 2025  LIPASE 19   No results for input(s): AMMONIA in the last 168 hours. CBC: Recent Labs  Lab 03/29/18 2025 03/29/18 2034 03/30/18 0316 03/31/18 0655  WBC 11.7*  --  12.2* 11.4*  NEUTROABS 8.5*  --   --   --   HGB 12.5 13.9 10.8* 9.6*  HCT 41.4 41.0 37.0 31.7*  MCV 101.0*  --  103.1* 99.7  PLT 195  --  172 133*   Cardiac Enzymes: Recent Labs  Lab 03/30/18 1526  TROPONINI <0.03   BNP: BNP (last 3 results) No results for input(s): BNP in the last 8760 hours.  ProBNP (last 3 results) No results for input(s): PROBNP in the last 8760 hours.  CBG: No results for input(s): GLUCAP in the last 168 hours.     Signed:  Desiree Hane, MD [facility] 03/31/2018, 2:33 PM

## 2018-05-03 ENCOUNTER — Other Ambulatory Visit: Payer: Self-pay | Admitting: Cardiology

## 2018-05-04 NOTE — Telephone Encounter (Signed)
Rx has been sent to the pharmacy electronically. ° °

## 2018-05-29 DIAGNOSIS — E782 Mixed hyperlipidemia: Secondary | ICD-10-CM | POA: Diagnosis not present

## 2018-05-29 DIAGNOSIS — G459 Transient cerebral ischemic attack, unspecified: Secondary | ICD-10-CM | POA: Diagnosis not present

## 2018-05-29 DIAGNOSIS — N183 Chronic kidney disease, stage 3 (moderate): Secondary | ICD-10-CM | POA: Diagnosis not present

## 2018-05-29 DIAGNOSIS — Z681 Body mass index (BMI) 19 or less, adult: Secondary | ICD-10-CM | POA: Diagnosis not present

## 2018-05-29 DIAGNOSIS — E440 Moderate protein-calorie malnutrition: Secondary | ICD-10-CM | POA: Diagnosis not present

## 2018-05-29 DIAGNOSIS — I700 Atherosclerosis of aorta: Secondary | ICD-10-CM | POA: Diagnosis not present

## 2018-05-29 DIAGNOSIS — H409 Unspecified glaucoma: Secondary | ICD-10-CM | POA: Diagnosis not present

## 2018-05-29 DIAGNOSIS — I10 Essential (primary) hypertension: Secondary | ICD-10-CM | POA: Diagnosis not present

## 2018-05-29 DIAGNOSIS — I2510 Atherosclerotic heart disease of native coronary artery without angina pectoris: Secondary | ICD-10-CM | POA: Diagnosis not present

## 2018-05-29 DIAGNOSIS — M810 Age-related osteoporosis without current pathological fracture: Secondary | ICD-10-CM | POA: Diagnosis not present

## 2018-06-01 ENCOUNTER — Other Ambulatory Visit: Payer: Self-pay | Admitting: Cardiology

## 2018-07-10 IMAGING — US US ABDOMEN COMPLETE
1 series · 14 of 25 positions shown · non-contrast
Comparison: Abdomen and pelvis CT 03/11/2014

CLINICAL DATA: Epigastric pain since 10 a.m. this morning.

EXAM:
ABDOMEN ULTRASOUND COMPLETE

[Series 1: us abdomen complete · 0.19mm/px · 14 of 68 slices shown]
[im 1/68]
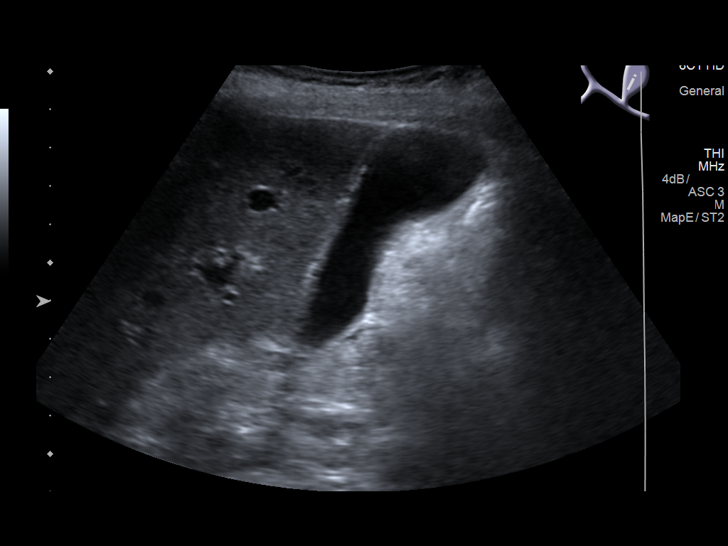
[im 6/68]
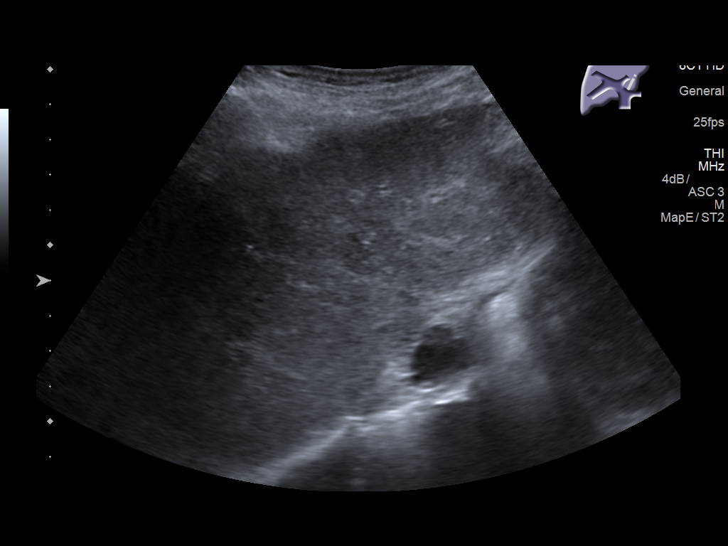
[im 12/68]
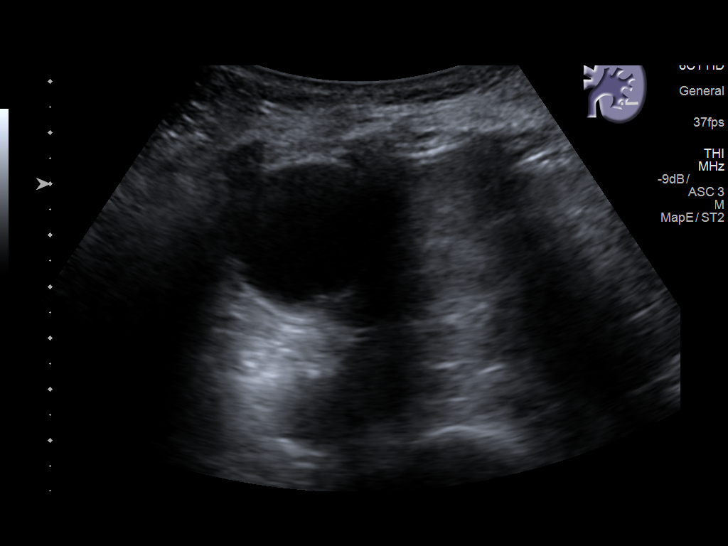
[im 17/68]
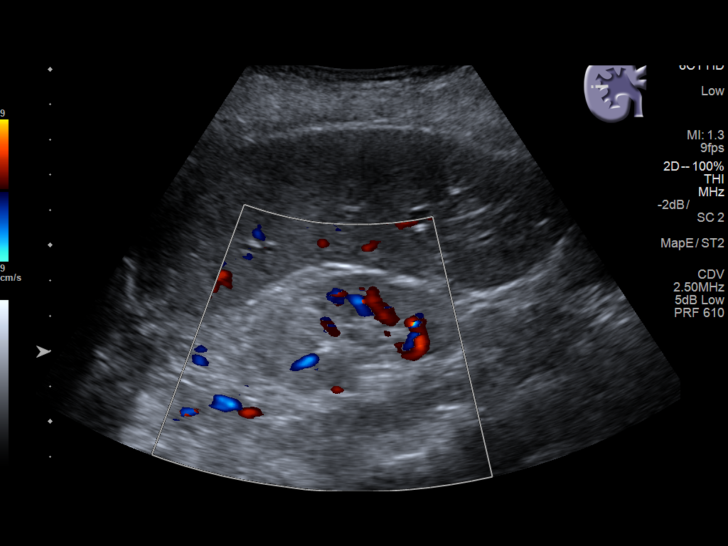
[im 23/68]
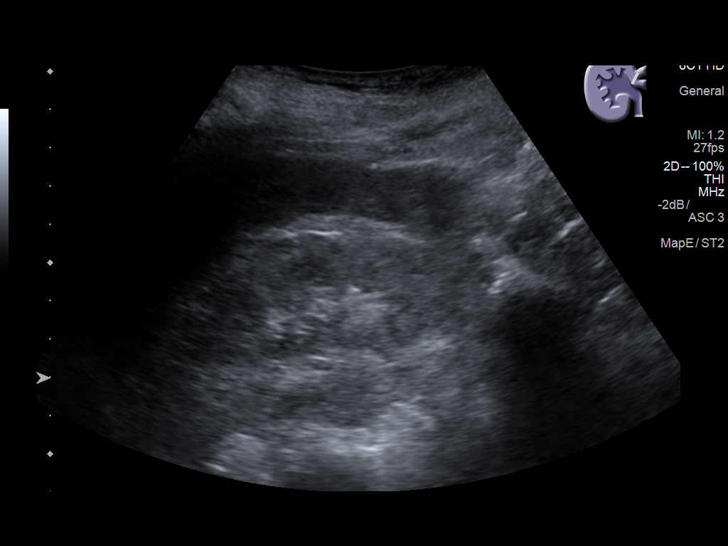
[im 26/68]
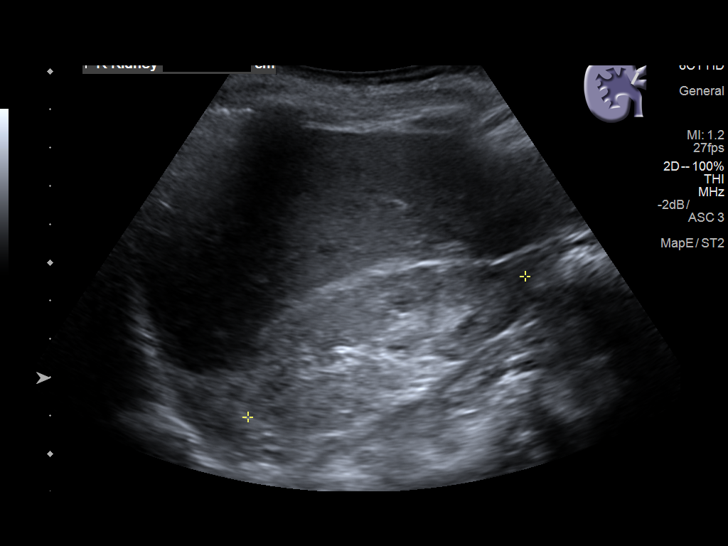
[im 31/68]
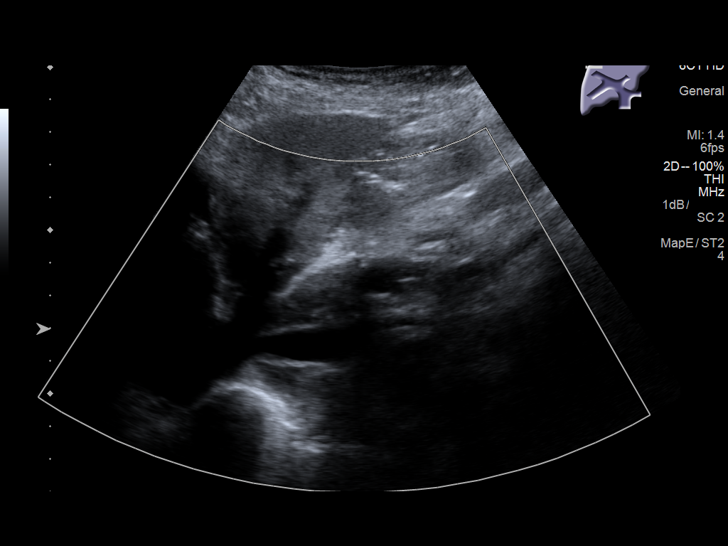
[im 37/68]
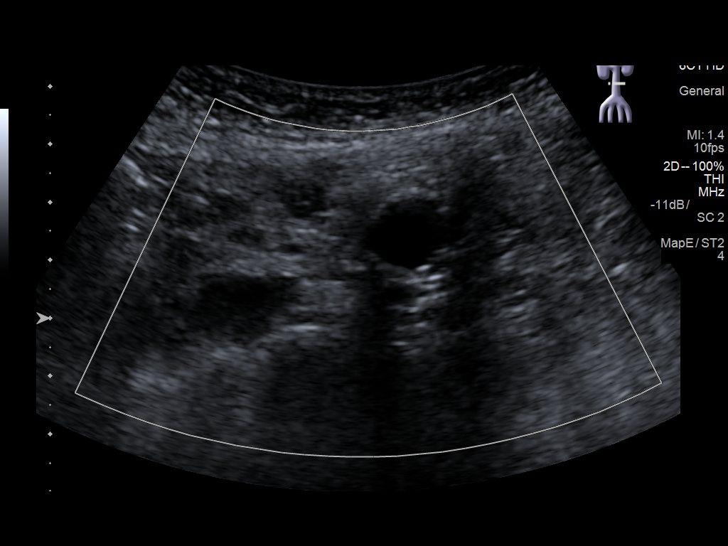
[im 42/68]
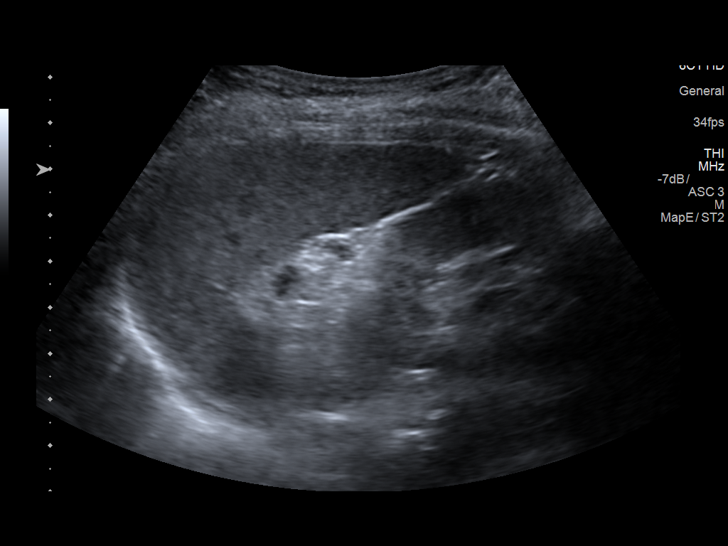
[im 45/68]
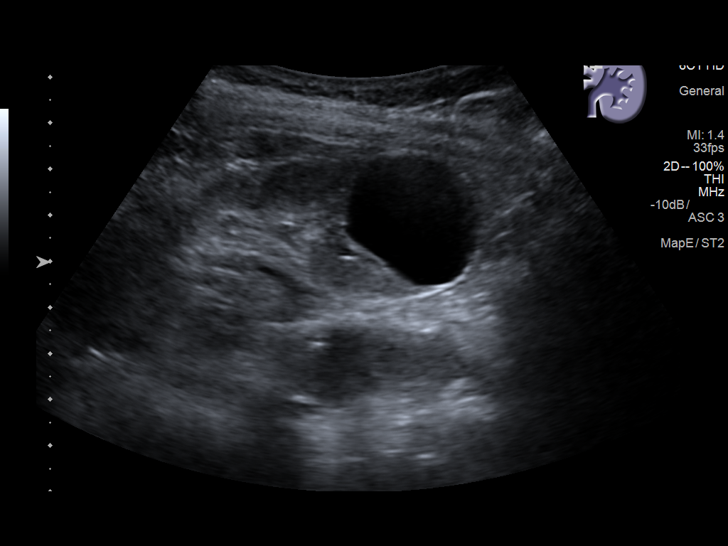
[im 51/68]
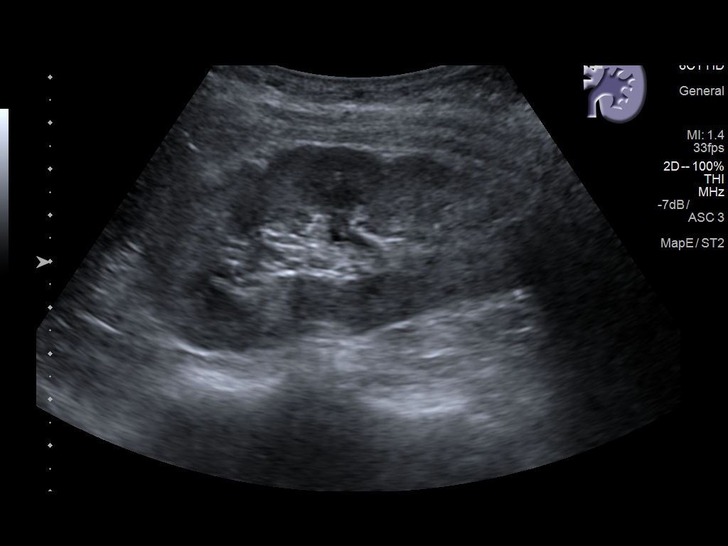
[im 56/68]
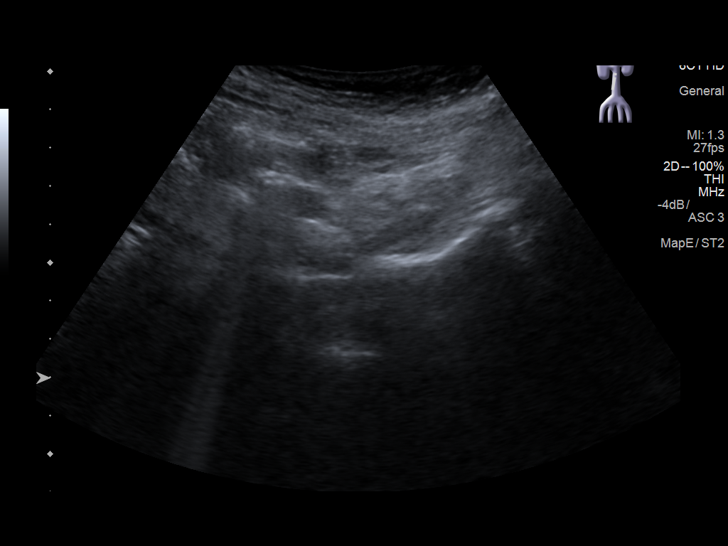
[im 62/68]
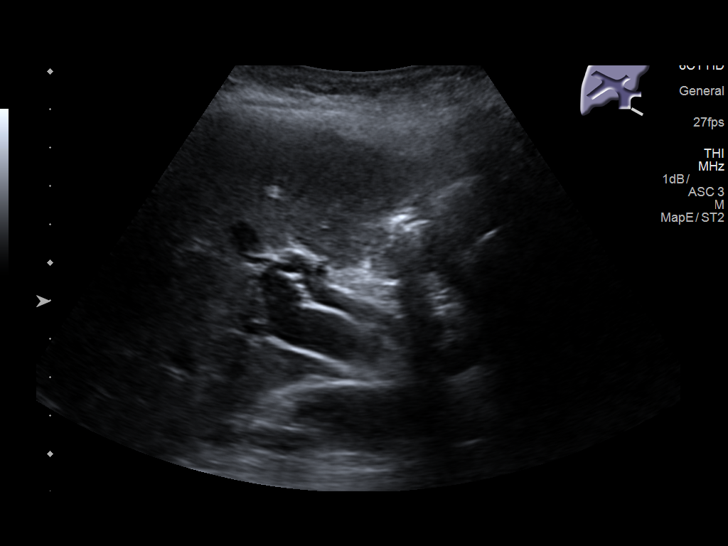
[im 68/68]
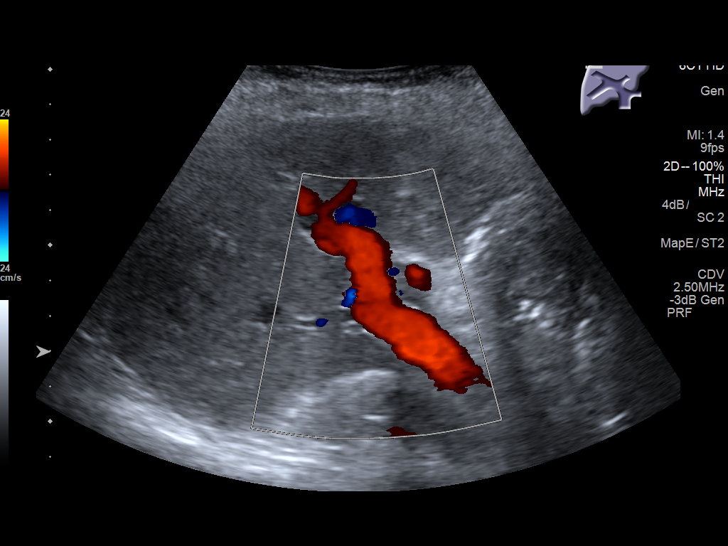

[14 of 25 positions shown; findings below may reference images not displayed]

FINDINGS: Gallbladder: No gallstones or gallbladder wall thickening. No
pericholecystic fluid. The sonographer reports no sonographic
Murphy's sign.

Common bile duct: Diameter: 4 mm

Liver: No focal lesion identified. Within normal limits in
parenchymal echogenicity.

IVC: No abnormality visualized.

Pancreas: Obscured by bowel gas

Spleen: Size and appearance within normal limits.

Right Kidney: Length: 8.1 cm. Cortex looks echogenic compared to the
reference liver parenchyma.. No hydronephrosis.

Left Kidney: Length: 9.2 cm. 3.0 cm simple cyst identified lower
pole. Simple cyst was seen in the lower pole left kidney on previous
CT over 2 years ago, measuring 2.5 cm at that time.

Abdominal aorta: No aneurysm visualized.

Other findings: None.
IMPRESSION: No acute findings in the abdomen. Specifically, no sonographic
evidence to explain the patient's history of pain.

## 2018-07-13 ENCOUNTER — Other Ambulatory Visit: Payer: Self-pay

## 2018-07-13 MED ORDER — METOPROLOL SUCCINATE ER 25 MG PO TB24: 75.0000 mg | ORAL_TABLET | Freq: Every day | ORAL | 3 refills | Status: DC

## 2018-07-30 ENCOUNTER — Ambulatory Visit: Payer: Medicare Other | Admitting: Cardiology

## 2018-08-23 ENCOUNTER — Other Ambulatory Visit: Payer: Self-pay | Admitting: Cardiology

## 2018-08-24 NOTE — Telephone Encounter (Signed)
Brilinta refilled.

## 2018-09-25 DIAGNOSIS — E440 Moderate protein-calorie malnutrition: Secondary | ICD-10-CM | POA: Diagnosis not present

## 2018-09-25 DIAGNOSIS — I2510 Atherosclerotic heart disease of native coronary artery without angina pectoris: Secondary | ICD-10-CM | POA: Diagnosis not present

## 2018-09-25 DIAGNOSIS — Z1159 Encounter for screening for other viral diseases: Secondary | ICD-10-CM | POA: Diagnosis not present

## 2018-09-25 DIAGNOSIS — G459 Transient cerebral ischemic attack, unspecified: Secondary | ICD-10-CM | POA: Diagnosis not present

## 2018-09-25 DIAGNOSIS — I700 Atherosclerosis of aorta: Secondary | ICD-10-CM | POA: Diagnosis not present

## 2018-09-25 DIAGNOSIS — Z681 Body mass index (BMI) 19 or less, adult: Secondary | ICD-10-CM | POA: Diagnosis not present

## 2018-09-25 DIAGNOSIS — E782 Mixed hyperlipidemia: Secondary | ICD-10-CM | POA: Diagnosis not present

## 2018-09-25 DIAGNOSIS — I10 Essential (primary) hypertension: Secondary | ICD-10-CM | POA: Diagnosis not present

## 2018-10-02 DIAGNOSIS — H401133 Primary open-angle glaucoma, bilateral, severe stage: Secondary | ICD-10-CM | POA: Diagnosis not present

## 2018-10-06 ENCOUNTER — Other Ambulatory Visit: Payer: Self-pay | Admitting: Cardiology

## 2018-10-08 DIAGNOSIS — H401133 Primary open-angle glaucoma, bilateral, severe stage: Secondary | ICD-10-CM | POA: Diagnosis not present

## 2018-10-22 DIAGNOSIS — H401113 Primary open-angle glaucoma, right eye, severe stage: Secondary | ICD-10-CM | POA: Diagnosis not present

## 2018-11-15 ENCOUNTER — Other Ambulatory Visit: Payer: Self-pay | Admitting: Cardiology

## 2018-12-01 ENCOUNTER — Other Ambulatory Visit: Payer: Self-pay | Admitting: Cardiology

## 2018-12-20 ENCOUNTER — Other Ambulatory Visit: Payer: Self-pay | Admitting: Cardiology

## 2018-12-25 ENCOUNTER — Other Ambulatory Visit: Payer: Self-pay | Admitting: Cardiology

## 2018-12-28 ENCOUNTER — Other Ambulatory Visit: Payer: Self-pay | Admitting: Cardiology

## 2019-01-14 ENCOUNTER — Other Ambulatory Visit: Payer: Self-pay | Admitting: Cardiology

## 2019-01-20 ENCOUNTER — Other Ambulatory Visit: Payer: Self-pay | Admitting: Cardiology

## 2019-01-21 DIAGNOSIS — E785 Hyperlipidemia, unspecified: Secondary | ICD-10-CM | POA: Insufficient documentation

## 2019-01-21 NOTE — Progress Notes (Signed)
HPI The patient presents for followup of known coronary disease. She was in the hospital in Jan 2018 with NSTEMI. She had an acute inferior lateral STEMI.   This was felt to be secondary to RCA occlusion. She had stenting of distal RCA into PDA with DES. The remainder of RCA was treated with POBA. She does have residual disease in mid LAD up to 90%. This is a heavily calcified vessel. Per Dr. Martinique  the left main disease is significant.  The LAD disease is acomplex lesion and would require atherectomy. Given her age the decision was to manage medically but if she has recurrent angina it was thought that it would be reasonable to consider PCI of LAD.  She was started on a statin. She came back to the hospital with chest pain but this time was found to have elevated liver enzymes.  She was taken off statin.     Since I last saw her she was in the hospital in December for colitis.  I reviewed these records because she was anemic.  Her last hemoglobin was less than 10 that I see in December.  She has not had any GI bleeding that she knows of.  She was never taken off Brilinta.  She has had none of the symptoms that she had back in 2018.  She gets out and does her shopping and does her housework by herself.  She denies any chest pressure, neck or arm discomfort.  She does have the palpitations, presyncope or syncope.  She has no PND or orthopnea.  She has no weight gain or edema.   Allergies  Allergen Reactions  . Diphenhydramine Hcl Other (See Comments)    unknown  . Penicillins Other (See Comments)    unknown  . Shellfish Allergy Other (See Comments)    unknown    Current Outpatient Medications  Medication Sig Dispense Refill  . amLODipine (NORVASC) 5 MG tablet Take 1 tablet (5 mg total) by mouth daily. Office visit needed 30 tablet 0  . aspirin 81 MG tablet Take 1 tablet (81 mg total) by mouth daily.    . bimatoprost (LUMIGAN) 0.01 % SOLN Place 1 drop into both eyes at bedtime.     .  brimonidine (ALPHAGAN) 0.15 % ophthalmic solution Place 1 drop into the right eye 2 (two) times daily. (Patient taking differently: Place 1 drop into both eyes 3 (three) times daily. ) 5 mL 0  . Coenzyme Q10 (COQ10) 50 MG CAPS Take 50 mg by mouth daily.    . dorzolamide (TRUSOPT) 2 % ophthalmic solution Place 1 drop into both eyes 3 (three) times daily.    . metoprolol succinate (TOPROL-XL) 25 MG 24 hr tablet TAKE 3 TABLETS BY MOUTH EVERY DAY 90 tablet 0  . Multiple Vitamins-Minerals (ADULT ONE DAILY GUMMIES) CHEW Chew 2 tablets by mouth daily.    . nitroGLYCERIN (NITROSTAT) 0.4 MG SL tablet Place 1 tablet (0.4 mg total) under the tongue every 5 (five) minutes as needed. For chest pain 25 tablet 2  . pantoprazole (PROTONIX) 40 MG tablet TAKE 1 TABLET BY MOUTH EVERY DAY (Patient taking differently: Take 40 mg by mouth daily. ) 30 tablet 9  . ramipril (ALTACE) 10 MG capsule TAKE ONE CAPSULE BY MOUTH EVERY DAY (Patient taking differently: Take 10 mg by mouth daily. ) 30 capsule 6  . ticagrelor (BRILINTA) 90 MG TABS tablet TAKE 1 TABLET BY MOUTH TWICE A DAY *SCHEDULE APPOINTMENT FOR MORE REFILLS* 180 tablet  3  . traMADol (ULTRAM) 50 MG tablet Take 50 mg by mouth daily as needed (pain).      No current facility-administered medications for this visit.     Past Medical History:  Diagnosis Date  . Abscess 2003   I&D SCM abscess  . Acute ischemic colitis ([place]) 04/10/11   acute episode of ischemic colitis, with abd pain, CT evidence of transverse colitis, and blood streaked BM.  Resolved with conservative treatment.  . Arthritis    "hands, joints, knees" (07/19/2016)  . Bacteremia 2003   hx of staph bacteremia  . CAD (coronary artery disease) 2003, 2010   s/p stenting of RCA x2, s/p stent to OM;  LHC 04/2009 in the setting of a NSTEMI: EF 65%, RCA stent patent with 60% ISR proximal and 40% ISR mid, PLV proximal 50-60% and distal 50%, ostial circumflex 30-40%, proximal OM mild in-stent restenosis with a  branch with 90+ percent ostial stenosis, proximal LAD 30-40%.  FFR of ostial circumflex:  0.96 - not flow limiting.  Medical therapy was continued.  . Chronic lower back pain    "not a big problem" (07/19/2016)  . Closed displaced fracture of distal phalanx of left middle finger 05/23/2017  . Degenerative disc disease   . GERD (gastroesophageal reflux disease)   . History of medication noncompliance    concerning Plavix  . HTN (hypertension)    Echocardiogram 08/26/11: Mild focal basal septal hypertrophy, EF 65-68%, grade 2 diastolic dysfunction, mild MR.  There was a fluid-filled area likely in the location of the liver noted  . Hyperlipidemia   . Hyperparathyroidism 2003   parathyroid adenoma localized to the right inferior thyroid lobe region  . Hypothyroidism   . Left renal artery stenosis ([facility])    40%  . Lumbar stenosis with neurogenic claudication 2006   s/p decompression L2-L5  . Myocardial infarction [facility]) ~ 2013; 05/06/2016  . Osteoporosis 2009   diagnosed by bone density scan in 2009, on Vit D only, no bisphosphonate therapy  . Phlebitis    LLE  . PVD (peripheral vascular disease) ([facility])    s/p left fem-pop bypass graft  . TIA (transient ischemic attack)    carotid dopplers 5/13: no ICA stenosis    Past Surgical History:  Procedure Laterality Date  . AMPUTATION FINGER Left 05/24/2017   Procedure: AMPUTATION tip of long FINGER;  Surgeon: Dayna Barker, MD;  Location: [facility];  Service: Plastics;  Laterality: Left;  . CARDIAC CATHETERIZATION N/A 05/06/2016   Procedure: Left Heart Cath and Coronary Angiography;  Surgeon: Lorretta Harp, MD;  Location: [facility];  Service: Cardiovascular;  Laterality: N/A;  . CARDIAC CATHETERIZATION N/A 05/06/2016   Procedure: Coronary Stent Intervention;  Surgeon: Lorretta Harp, MD;  Location: [facility];  Service: Cardiovascular;  Laterality: N/A;  RCA  . CATARACT EXTRACTION, BILATERAL Bilateral   . CORONARY ANGIOPLASTY WITH  STENT PLACEMENT  09/2008  . CORONARY ANGIOPLASTY WITH STENT PLACEMENT  04/2001  . ENDOSCOPIC HEMILAMINOTOMY W/ DISCECTOMY LUMBAR  06/2004   L3, L4 lami; L2, L5 hemilami; decompresion L2-3, 3-4, 4-  . FEMORAL BYPASS  before 2003   LLE  . I&D EXTREMITY Left 05/24/2017   Procedure: IRRIGATION AND DEBRIDEMENT LEFT LONG FINGER;  Surgeon: Dayna Barker, MD;  Location: [facility];  Service: Plastics;  Laterality: Left;  . INCISION AND DRAINAGE ANTERIOR NECK  10/2001   absces sternoclavicular joint    ROS:    As stated in the HPI and  negative for all other systems.   PHYSICAL EXAM BP (!) 149/78   Pulse (!) 58   Ht 5\' 1"  (1.549 m)   Wt 92 lb (41.7 kg)   SpO2 100%   BMI 17.38 kg/m   GENERAL:  Well appearing NECK:  No jugular venous distention, waveform within normal limits, carotid upstroke brisk and symmetric, no bruits, no thyromegaly LUNGS:  Clear to auscultation bilaterally CHEST:  Unremarkable HEART:  PMI not displaced or sustained,S1 and S2 within normal limits, no S3, no S4, no clicks, no rubs, no murmurs ABD:  Flat, positive bowel sounds normal in frequency in pitch, no bruits, no rebound, no guarding, no midline pulsatile mass, no hepatomegaly, no splenomegaly EXT:  2 plus pulses throughout, no edema, no cyanosis no clubbing   EKG: Sinus rhythm, rate 58, axis within normal limits, intervals within normal limits, no acute ST-T wave changes.  01/22/2019  ASSESSMENT AND PLAN  CAD:    I reviewed the patient's cath 2018.  She had a lot of stent placed into her RCA and an high-grade LAD with collaterals.  I would in this situation ideally continue DAPT.  However, we will get a check with her provider and they are going to talk with him as well to make sure that she is not got progressive anemia and that her hemoglobin has rebounded.  If not I would continue the Brilinta.  I had a long discussion with the patient and her granddaughter about this risk benefits.    HTN:  The blood pressure is  mildly elevated here but typically at target.  No change in therapy.   DYSLIPIDEMIA:        She is not tolerant of statins.  No change in therapy.

## 2019-01-22 ENCOUNTER — Encounter: Payer: Self-pay | Admitting: Cardiology

## 2019-01-22 ENCOUNTER — Ambulatory Visit (INDEPENDENT_AMBULATORY_CARE_PROVIDER_SITE_OTHER): Payer: Medicare Other | Admitting: Cardiology

## 2019-01-22 ENCOUNTER — Other Ambulatory Visit: Payer: Self-pay

## 2019-01-22 VITALS — BP 149/78 | HR 58 | Ht 61.0 in | Wt 92.0 lb

## 2019-01-22 DIAGNOSIS — I10 Essential (primary) hypertension: Secondary | ICD-10-CM

## 2019-01-22 DIAGNOSIS — E785 Hyperlipidemia, unspecified: Secondary | ICD-10-CM | POA: Diagnosis not present

## 2019-01-22 DIAGNOSIS — I2510 Atherosclerotic heart disease of native coronary artery without angina pectoris: Secondary | ICD-10-CM

## 2019-01-22 MED ORDER — TICAGRELOR 90 MG PO TABS: ORAL_TABLET | ORAL | 3 refills | Status: DC

## 2019-01-22 NOTE — Patient Instructions (Addendum)

## 2019-01-28 DIAGNOSIS — H401113 Primary open-angle glaucoma, right eye, severe stage: Secondary | ICD-10-CM | POA: Diagnosis not present

## 2019-01-29 DIAGNOSIS — Z681 Body mass index (BMI) 19 or less, adult: Secondary | ICD-10-CM | POA: Diagnosis not present

## 2019-01-29 DIAGNOSIS — G459 Transient cerebral ischemic attack, unspecified: Secondary | ICD-10-CM | POA: Diagnosis not present

## 2019-01-29 DIAGNOSIS — I2510 Atherosclerotic heart disease of native coronary artery without angina pectoris: Secondary | ICD-10-CM | POA: Diagnosis not present

## 2019-01-29 DIAGNOSIS — I10 Essential (primary) hypertension: Secondary | ICD-10-CM | POA: Diagnosis not present

## 2019-01-29 DIAGNOSIS — Z1159 Encounter for screening for other viral diseases: Secondary | ICD-10-CM | POA: Diagnosis not present

## 2019-01-29 DIAGNOSIS — M810 Age-related osteoporosis without current pathological fracture: Secondary | ICD-10-CM | POA: Diagnosis not present

## 2019-01-29 DIAGNOSIS — E440 Moderate protein-calorie malnutrition: Secondary | ICD-10-CM | POA: Diagnosis not present

## 2019-01-29 DIAGNOSIS — N1831 Chronic kidney disease, stage 3a: Secondary | ICD-10-CM | POA: Diagnosis not present

## 2019-02-04 ENCOUNTER — Other Ambulatory Visit: Payer: Self-pay | Admitting: Cardiology

## 2019-02-04 MED ORDER — PANTOPRAZOLE SODIUM 40 MG PO TBEC: 40.0000 mg | DELAYED_RELEASE_TABLET | Freq: Every day | ORAL | 3 refills | Status: DC

## 2019-02-04 NOTE — Telephone Encounter (Signed)
New Message    *STAT* If patient is at the pharmacy, call can be transferred to refill team.   1. Which medications need to be refilled? (please list name of each medication and dose if known) pantoprazole (PROTONIX) 40 MG tablet    2. Which pharmacy/location (including street and city if local pharmacy) is medication to be sent to? [id number] - [place], [place]  3. Do they need a 30 day or 90 day supply? [place]

## 2019-02-04 NOTE — Telephone Encounter (Signed)
Requested Prescriptions   Signed Prescriptions Disp Refills  . pantoprazole (PROTONIX) 40 MG tablet 90 tablet 3    Sig: Take 1 tablet (40 mg total) by mouth daily.    Authorizing Provider: Minus Breeding    Ordering User: Raelene Bott, Tonita Bills L

## 2019-02-10 ENCOUNTER — Other Ambulatory Visit: Payer: Self-pay | Admitting: Cardiology

## 2019-02-11 ENCOUNTER — Other Ambulatory Visit: Payer: Self-pay

## 2019-02-11 MED ORDER — METOPROLOL SUCCINATE ER 25 MG PO TB24: 75.0000 mg | ORAL_TABLET | Freq: Every day | ORAL | 2 refills | Status: DC

## 2019-03-04 DIAGNOSIS — H401113 Primary open-angle glaucoma, right eye, severe stage: Secondary | ICD-10-CM | POA: Diagnosis not present

## 2019-04-06 ENCOUNTER — Other Ambulatory Visit: Payer: Self-pay | Admitting: Cardiology

## 2019-04-30 ENCOUNTER — Other Ambulatory Visit: Payer: Self-pay | Admitting: Cardiology

## 2019-04-30 MED ORDER — METOPROLOL SUCCINATE ER 25 MG PO TB24: 75.0000 mg | ORAL_TABLET | Freq: Every day | ORAL | 9 refills | Status: DC

## 2019-06-04 ENCOUNTER — Ambulatory Visit: Payer: Medicare Other | Admitting: Legal Medicine

## 2019-06-18 ENCOUNTER — Ambulatory Visit: Payer: Medicare Other | Admitting: Legal Medicine

## 2019-07-08 ENCOUNTER — Telehealth: Payer: Self-pay | Admitting: Legal Medicine

## 2019-07-08 NOTE — Progress Notes (Signed)
  Chronic Care Management   Note  07/08/2019 Name: RANESHIA DERICK MRN: [id number] DOB: [birth year]  IONIA SCHEY is a 86 y.o. year old female who is a primary care patient of Lillard Anes, MD. I reached out to [name] by phone today in response to a referral sent by Ms. Jaynie Bream Sok's PCP, Lillard Anes, MD.   Ms. Shiplett was given information about Chronic Care Management services today including:  1. CCM service includes personalized support from designated clinical staff supervised by her physician, including individualized plan of care and coordination with other care providers 2. 24/7 contact phone numbers for assistance for urgent and routine care needs. 3. Service will only be billed when office clinical staff spend 20 minutes or more in a month to coordinate care. 4. Only one practitioner may furnish and bill the service in a calendar month. 5. The patient may stop CCM services at any time (effective at the end of the month) by phone call to the office staff.   Patient agreed to services and verbal consent obtained.   Follow up plan:   Earney [place] Upstream Scheduler

## 2019-07-15 ENCOUNTER — Ambulatory Visit: Payer: Medicare Other

## 2019-07-15 ENCOUNTER — Other Ambulatory Visit: Payer: Self-pay

## 2019-07-15 DIAGNOSIS — E785 Hyperlipidemia, unspecified: Secondary | ICD-10-CM

## 2019-07-15 DIAGNOSIS — I10 Essential (primary) hypertension: Secondary | ICD-10-CM

## 2019-07-15 NOTE — Patient Instructions (Addendum)
Visit Information  Thank you for your time discussing your medications. I look forward to working with you to achieve your health care goals. Below is a summary of what we talked about during our visit.   Goals Addressed            This Visit's Progress   . Pharmacy Care Plan       CARE PLAN ENTRY  Current Barriers:  . Chronic Disease Management support, education, and care coordination needs related to CAD, HTN, and HLD  Pharmacist Clinical Goal(s):  . Ensure safety, efficacy, and affordability of medications . Recommend maintaining BP <140/90 mmHg  . Goal Cholesterol: TC <200, LDL <100, TG <150 HDL >50 mg/dL  Interventions: . Comprehensive medication review performed. . Recommend following the Dash diet guidance of incorporating fruits, vegetables, whole-grains, low-fat dairy products, lean meats, legumes and nuts for optimal health.  Patient Self Care Activities:  . Recommend patient continue doing a great job taking her medications.  . Recommend patient continue to eat sources of iron in diet.   Initial goal documentation        [name] was given information about Chronic Care Management services today including:  1. CCM service includes personalized support from designated clinical staff supervised by her physician, including individualized plan of care and coordination with other care providers 2. 24/7 contact phone numbers for assistance for urgent and routine care needs. 3. Standard insurance, coinsurance, copays and deductibles apply for chronic care management only during months in which we provide at least 20 minutes of these services. Most insurances cover these services at 100%, however patients may be responsible for any copay, coinsurance and/or deductible if applicable. This service may help you avoid the need for more expensive face-to-face services. 4. Only one practitioner may furnish and bill the service in a calendar month. 5. The patient may stop CCM  services at any time (effective at the end of the month) by phone call to the office staff.  Patient agreed to services and verbal consent obtained.   Print copy of patient instructions provided.  Telephone follow up appointment with pharmacy team member scheduled for: 6 months 01/14/2020 at 11:30 am  Sherre Poot, PharmD Clinical Pharmacist [name] [phone]  Iron-Rich Diet  Iron is a mineral that helps your body to produce hemoglobin. Hemoglobin is a protein in red blood cells that carries oxygen to your body's tissues. Eating too little iron may cause you to feel weak and tired, and it can increase your risk of infection. Iron is naturally found in many foods, and many foods have iron added to them (iron-fortified foods). You may need to follow an iron-rich diet if you do not have enough iron in your body due to certain medical conditions. The amount of iron that you need each day depends on your age, your sex, and any medical conditions you have. Follow instructions from your health care provider or a diet and nutrition specialist (dietitian) about how much iron you should eat each day. What are tips for following this plan? Reading food labels  Check food labels to see how many milligrams (mg) of iron are in each serving. Cooking  Cook foods in pots and pans that are made from iron.  Take these steps to make it easier for your body to absorb iron from certain foods: ? Soak beans overnight before cooking. ? Soak whole grains overnight and drain them before using. ? Ferment flours before baking, such as by  using yeast in bread dough. Meal planning  When you eat foods that contain iron, you should eat them with foods that are high in vitamin C. These include oranges, peppers, tomatoes, potatoes, and mango. Vitamin C helps your body to absorb iron. General information  Take iron supplements only as told by your health care provider. An overdose of iron can be  life-threatening. If you were prescribed iron supplements, take them with orange juice or a vitamin C supplement.  When you eat iron-fortified foods or take an iron supplement, you should also eat foods that naturally contain iron, such as meat, poultry, and fish. Eating naturally iron-rich foods helps your body to absorb the iron that is added to other foods or contained in a supplement.  Certain foods and drinks prevent your body from absorbing iron properly. Avoid eating these foods in the same meal as iron-rich foods or with iron supplements. These foods include: ? Coffee, black tea, and red wine. ? Milk, dairy products, and foods that are high in calcium. ? Beans and soybeans. ? Whole grains. What foods should I eat? Fruits Prunes. Raisins. Eat fruits high in vitamin C, such as oranges, grapefruits, and strawberries, alongside iron-rich foods. Vegetables Spinach (cooked). Green peas. Broccoli. Fermented vegetables. Eat vegetables high in vitamin C, such as leafy greens, potatoes, bell peppers, and tomatoes, alongside iron-rich foods. Grains Iron-fortified breakfast cereal. Iron-fortified whole-wheat bread. Enriched rice. Sprouted grains. Meats and other proteins Beef liver. Oysters. Beef. Shrimp. [place]. Chicken. [place]. Sardines. Chickpeas. Nuts. Tofu. Pumpkin seeds. Beverages Tomato juice. Fresh orange juice. Prune juice. Hibiscus tea. Fortified instant breakfast shakes. Sweets and desserts Blackstrap molasses. Seasonings and condiments Tahini. Fermented soy sauce. Other foods Wheat germ. The items listed above may not be a complete list of recommended foods and beverages. Contact a dietitian for more information. What foods should I avoid? Grains Whole grains. Bran cereal. Bran flour. Oats. Meats and other proteins Soybeans. Products made from soy protein. Black beans. Lentils. Mung beans. Split peas. Dairy Milk. Cream. Cheese. Yogurt. Cottage cheese. Beverages Coffee.  Black tea. Red wine. Sweets and desserts Cocoa. Chocolate. Ice cream. Other foods Basil. Oregano. Large amounts of parsley. The items listed above may not be a complete list of foods and beverages to avoid. Contact a dietitian for more information. Summary  Iron is a mineral that helps your body to produce hemoglobin. Hemoglobin is a protein in red blood cells that carries oxygen to your body's tissues.  Iron is naturally found in many foods, and many foods have iron added to them (iron-fortified foods).  When you eat foods that contain iron, you should eat them with foods that are high in vitamin C. Vitamin C helps your body to absorb iron.  Certain foods and drinks prevent your body from absorbing iron properly, such as whole grains and dairy products. You should avoid eating these foods in the same meal as iron-rich foods or with iron supplements. This information is not intended to replace advice given to you by your health care provider. Make sure you discuss any questions you have with your health care provider. Document Revised: 03/14/2017 Document Reviewed: 02/25/2017 Elsevier Patient Education  2020 [employer].

## 2019-07-15 NOTE — Chronic Care Management (AMB) (Signed)
Chronic Care Management Pharmacy  Name: [name]  MRN: [id number] DOB: 1925/08/07  Chief Complaint/ HPI  [name],  86 y.o. , female presents for their Initial CCM visit with the clinical pharmacist via telephone due to COVID-19 Pandemic.  PCP : Lillard Anes, MD  Their chronic conditions include: HTN, CAD, PVD, HLD, Depression, Osteoporosis, Angina. Primary open-angle glaucoma.   Office Visits: 01/29/2019 - mild anemia, kidney stable, elevated calcium and alk phos; GFR 36. Dexa scan and PTH ordered.  Consult Visit: 03/04/2019 - Dr. Manuella Ghazi visit for glaucoma.  01/28/2019 - Dr. Manuella Ghazi visit for glaucoma.  01/22/2019 - Dr. Percival Spanish for CAD.   Medications: Outpatient Encounter Medications as of 07/15/2019  Medication Sig  . amLODipine (NORVASC) 5 MG tablet Take 1 tablet (5 mg total) by mouth daily.  . Ascorbic Acid (VITAMIN C) 100 MG tablet Take by mouth daily.  Marland Kitchen aspirin 81 MG tablet Take 1 tablet (81 mg total) by mouth daily.  Marland Kitchen b complex vitamins tablet Take 1 tablet by mouth daily.  . cholecalciferol (VITAMIN D3) 25 MCG (1000 UNIT) tablet Take by mouth daily.  . Coenzyme Q10 (COQ10) 50 MG CAPS Take 50 mg by mouth daily.  . dorzolamide (TRUSOPT) 2 % ophthalmic solution Place 1 drop into both eyes 3 (three) times daily.  . dorzolamide-timolol (COSOPT) 22.3-6.8 MG/ML ophthalmic solution 1 drop at bedtime.  Marland Kitchen ELDERBERRY PO Take by mouth daily.  Marland Kitchen MAGNESIUM GLUCONATE PO Take by mouth daily.  . metoprolol succinate (TOPROL-XL) 25 MG 24 hr tablet Take 3 tablets (75 mg total) by mouth daily.  . nitroGLYCERIN (NITROSTAT) 0.4 MG SL tablet Place 1 tablet (0.4 mg total) under the tongue every 5 (five) minutes as needed. For chest pain  . ramipril (ALTACE) 10 MG capsule TAKE ONE CAPSULE BY MOUTH EVERY DAY (Patient taking differently: Take 10 mg by mouth daily. )  . ticagrelor (BRILINTA) 90 MG TABS tablet TAKE 1 TABLET BY MOUTH TWICE A DAY *SCHEDULE APPOINTMENT FOR MORE  REFILLS*  . traMADol (ULTRAM) 50 MG tablet Take 50 mg by mouth daily as needed (pain).   . bimatoprost (LUMIGAN) 0.01 % SOLN Place 1 drop into both eyes at bedtime.   . brimonidine (ALPHAGAN) 0.15 % ophthalmic solution Place 1 drop into the right eye 2 (two) times daily. (Patient not taking: Reported on 07/15/2019)  . Multiple Vitamins-Minerals (ADULT ONE DAILY GUMMIES) CHEW Chew 2 tablets by mouth daily.  . pantoprazole (PROTONIX) 40 MG tablet Take 1 tablet (40 mg total) by mouth daily. (Patient not taking: Reported on 07/15/2019)  . [DISCONTINUED] simvastatin (ZOCOR) 40 MG tablet Take 1 tablet (40 mg total) by mouth at bedtime.   No facility-administered encounter medications on file as of 07/15/2019.     Current Diagnosis/Assessment:  Goals Addressed            This Visit's Progress   . Pharmacy Care Plan       CARE PLAN ENTRY  Current Barriers:  . Chronic Disease Management support, education, and care coordination needs related to CAD, HTN, and HLD  Pharmacist Clinical Goal(s):  . Ensure safety, efficacy, and affordability of medications . Recommend maintaining BP <140/90 mmHg  . Goal Cholesterol: TC <200, LDL <100, TG <150 HDL >50 mg/dL  Interventions: . Comprehensive medication review performed. . Recommend following the Dash diet guidance of incorporating fruits, vegetables, whole-grains, low-fat dairy products, lean meats, legumes and nuts for optimal health.  Patient Self Care Activities:  . Recommend patient  continue doing a great job taking her medications.  . Recommend patient continue to eat sources of iron in diet.   Initial goal documentation        Hypertension   Office blood pressures are  BP Readings from Last 3 Encounters:  01/22/19 (!) 149/78    Patient has failed these meds in the past: non indicated Patient is currently controlled on the following medications: amlodipine, metoprolol succ 25 mg, ramipril 10 mg  Patient checks BP at home  infrequently  Patient home BP readings are ranging: in goal per patient  We discussed Patient and her daughter on the phone indicating good compliance with medication. Patient is working to improve diet and gain weight. Indicates that she eats eggs, beans and rice.   Plan  Continue current medications   Hyperlipidemia/CAD/Hx of TIA   Lipid Panel   Patient has failed these meds in past: simvastatin (stopped due to liver enzymes) Patient is currently controlled on the following medications: brilinta 90 mg tablet, nitroglycerin, coq10 50 mg, amlodipine 5 mg, aspirin 81 mg  We discussed:  Patient is working to gain weight and reports a good diet. She indicates she is not a big eater and snacks throughout the day.   Plan  Continue current medications   Anemia   Last CBC revealed mild anemia.   We discussed: Patient is working to incorporate iron sources in her diet. We discussed including foods rich in iron into her diet (meat and dark leafy green vegetables)    Plan  Continue Incorporating iron in diet.    Pain   Patient has failed these meds in past: n/a Patient is currently controlled on the following medications: tramadol prn  We discussed:  Patient indicates that her pain is well controlled. She does not report any somnolence with tramadol use. She reports rarely needing a tramadol.   Plan  Continue current medications    and  Glaucoma:    Patient has failed these meds in past: lumigan, alphagan, trusopt Patient is currently controlled on the following medications: Cosopt  We discussed:  Continuing to follow-up with Dr. Manuella Ghazi and using eye drops as directed.   Plan  Continue current medications   Health Maintenance   Patient is currently controlled on the following medications for general wellness and immune support:   - Vitamin B Complex  - Vitamin D   - Elderberry  - Magnesium   - Vitamin C  We discussed: Overall wellness.   Plan  Continue  current medications  Medication Management   Pt uses [facility] for all medications Weekly pill box filled by daughter. Pt endorses good compliance  We discussed:  Patient's medication routine.   Plan  Continuing to take medication as directed.      Follow up: 6 month

## 2019-07-20 ENCOUNTER — Other Ambulatory Visit: Payer: Self-pay

## 2019-07-20 DIAGNOSIS — I10 Essential (primary) hypertension: Secondary | ICD-10-CM

## 2019-07-20 DIAGNOSIS — E782 Mixed hyperlipidemia: Secondary | ICD-10-CM

## 2019-07-20 NOTE — Progress Notes (Signed)
CCM appointment was on 07/15/2019 with Sherre Poot, PharmD.

## 2019-07-20 NOTE — Progress Notes (Signed)
CCM appointment was 07/15/2019 with [name] PharmD.

## 2019-08-06 ENCOUNTER — Other Ambulatory Visit: Payer: Self-pay | Admitting: Legal Medicine

## 2019-09-02 DIAGNOSIS — H401123 Primary open-angle glaucoma, left eye, severe stage: Secondary | ICD-10-CM | POA: Diagnosis not present

## 2019-10-06 ENCOUNTER — Ambulatory Visit (INDEPENDENT_AMBULATORY_CARE_PROVIDER_SITE_OTHER): Payer: Medicare Other | Admitting: Legal Medicine

## 2019-10-06 ENCOUNTER — Other Ambulatory Visit: Payer: Self-pay

## 2019-10-06 ENCOUNTER — Encounter: Payer: Self-pay | Admitting: Legal Medicine

## 2019-10-06 VITALS — BP 98/58 | HR 80 | Temp 97.0°F | Resp 16 | Ht 60.0 in | Wt 94.8 lb

## 2019-10-06 DIAGNOSIS — E440 Moderate protein-calorie malnutrition: Secondary | ICD-10-CM

## 2019-10-06 DIAGNOSIS — I739 Peripheral vascular disease, unspecified: Secondary | ICD-10-CM

## 2019-10-06 DIAGNOSIS — I2510 Atherosclerotic heart disease of native coronary artery without angina pectoris: Secondary | ICD-10-CM | POA: Diagnosis not present

## 2019-10-06 DIAGNOSIS — E782 Mixed hyperlipidemia: Secondary | ICD-10-CM | POA: Diagnosis not present

## 2019-10-06 DIAGNOSIS — I10 Essential (primary) hypertension: Secondary | ICD-10-CM

## 2019-10-06 DIAGNOSIS — Z681 Body mass index (BMI) 19 or less, adult: Secondary | ICD-10-CM | POA: Insufficient documentation

## 2019-10-06 NOTE — Progress Notes (Signed)
Subjective:  Patient ID: [name], female    DOB: 01-04-1926  Age: 86 y.o. MRN: [id number]  Chief Complaint  Patient presents with  . Hyperlipidemia  . Hypertension  . Coronary Artery Disease    HPI: Chronic visit  Patient presents for follow up of hypertension.  Patient tolerating amlodipine well with side effects.  Patient was diagnosed with hypertension 2010 so has been treated for hypertension for 10 years.Patient is working on maintaining diet and exercise regimen and follows up as directed. Complication include CAD.  Patient presents with hyperlipidemia.  Compliance with treatment has been good; patient takes medicines as directed, maintains low cholesterol diet, follows up as directed, and maintains exercise regimen.  Patient is using CAD without problems.  CORONARY ARTERY DISEASE  Patient presents in follow up of CAD. Patient was diagnosed in 2010. The patient has no associated CHF. The patient is currently taking a beta blocker, statin, and aspirin. CAD was diagnosed 10 years ago.  Patient is having no angina. Patient has used no NTG.  Patient is followed by cardiology.  Patient had none . Last angiography was na, last echocardiogram 2018.   Current Outpatient Medications on File Prior to Visit  Medication Sig Dispense Refill  . amLODipine (NORVASC) 5 MG tablet Take 1 tablet (5 mg total) by mouth daily. 90 tablet 3  . Ascorbic Acid (VITAMIN C) 100 MG tablet Take by mouth daily.    Marland Kitchen aspirin 81 MG tablet Take 1 tablet (81 mg total) by mouth daily.    Marland Kitchen b complex vitamins tablet Take 1 tablet by mouth daily.    . bimatoprost (LUMIGAN) 0.01 % SOLN Place 1 drop into both eyes at bedtime.     . cholecalciferol (VITAMIN D3) 25 MCG (1000 UNIT) tablet Take by mouth daily.    . Coenzyme Q10 (COQ10) 50 MG CAPS Take 50 mg by mouth daily.    . dorzolamide (TRUSOPT) 2 % ophthalmic solution Place 1 drop into both eyes 3 (three) times daily.    . dorzolamide-timolol (COSOPT) 22.3-6.8  MG/ML ophthalmic solution 1 drop at bedtime.    Marland Kitchen ELDERBERRY PO Take by mouth daily.    Marland Kitchen MAGNESIUM GLUCONATE PO Take by mouth daily.    . metoprolol succinate (TOPROL-XL) 25 MG 24 hr tablet Take 3 tablets (75 mg total) by mouth daily. 90 tablet 9  . Multiple Vitamins-Minerals (ADULT ONE DAILY GUMMIES) CHEW Chew 2 tablets by mouth daily.    . nitroGLYCERIN (NITROSTAT) 0.4 MG SL tablet Place 1 tablet (0.4 mg total) under the tongue every 5 (five) minutes as needed. For chest pain 25 tablet 2  . ticagrelor (BRILINTA) 90 MG TABS tablet TAKE 1 TABLET BY MOUTH TWICE A DAY *SCHEDULE APPOINTMENT FOR MORE REFILLS* 180 tablet 3  . traMADol (ULTRAM) 50 MG tablet TAKE 2 TABLETS BY MOUTH EVERY DAY 60 tablet 2  . [DISCONTINUED] simvastatin (ZOCOR) 40 MG tablet Take 1 tablet (40 mg total) by mouth at bedtime. 30 tablet 0   No current facility-administered medications on file prior to visit.   Past Medical History:  Diagnosis Date  . Abscess 2003   I&D SCM abscess  . Acute ischemic colitis ([place]) 04/10/11   acute episode of ischemic colitis, with abd pain, CT evidence of transverse colitis, and blood streaked BM.  Resolved with conservative treatment.  . Arthritis    "hands, joints, knees" (07/19/2016)  . Bacteremia 2003   hx of staph bacteremia  . CAD (coronary artery disease) 2003, 2010  s/p stenting of RCA x2, s/p stent to OM;  LHC 04/2009 in the setting of a NSTEMI: EF 65%, RCA stent patent with 60% ISR proximal and 40% ISR mid, PLV proximal 50-60% and distal 50%, ostial circumflex 30-40%, proximal OM mild in-stent restenosis with a branch with 90+ percent ostial stenosis, proximal LAD 30-40%.  FFR of ostial circumflex:  0.96 - not flow limiting.  Medical therapy was continued.  . Chronic lower back pain    "not a big problem" (07/19/2016)  . Closed displaced fracture of distal phalanx of left middle finger 05/23/2017  . Degenerative disc disease   . GERD (gastroesophageal reflux disease)   . History of  medication noncompliance    concerning Plavix  . HTN (hypertension)    Echocardiogram 08/26/11: Mild focal basal septal hypertrophy, EF 05-39%, grade 2 diastolic dysfunction, mild MR.  There was a fluid-filled area likely in the location of the liver noted  . Hyperlipidemia   . Hyperparathyroidism 2003   parathyroid adenoma localized to the right inferior thyroid lobe region  . Hypothyroidism   . Left renal artery stenosis (HCC)    40%  . Lumbar stenosis with neurogenic claudication 2006   s/p decompression L2-L5  . Myocardial infarction [facility]) ~ 2013; 05/06/2016  . Osteoporosis 2009   diagnosed by bone density scan in 2009, on Vit D only, no bisphosphonate therapy  . Phlebitis    LLE  . PVD (peripheral vascular disease) (HCC)    s/p left fem-pop bypass graft  . TIA (transient ischemic attack)    carotid dopplers 5/13: no ICA stenosis   Past Surgical History:  Procedure Laterality Date  . AMPUTATION FINGER Left 05/24/2017   Procedure: AMPUTATION tip of long FINGER;  Surgeon: Dayna Barker, MD;  Location: [place];  Service: Plastics;  Laterality: Left;  . CARDIAC CATHETERIZATION N/A 05/06/2016   Procedure: Left Heart Cath and Coronary Angiography;  Surgeon: Lorretta Harp, MD;  Location: [facility];  Service: Cardiovascular;  Laterality: N/A;  . CARDIAC CATHETERIZATION N/A 05/06/2016   Procedure: Coronary Stent Intervention;  Surgeon: Lorretta Harp, MD;  Location: [facility];  Service: Cardiovascular;  Laterality: N/A;  RCA  . CATARACT EXTRACTION, BILATERAL Bilateral   . CORONARY ANGIOPLASTY WITH STENT PLACEMENT  09/2008  . CORONARY ANGIOPLASTY WITH STENT PLACEMENT  04/2001  . ENDOSCOPIC HEMILAMINOTOMY W/ DISCECTOMY LUMBAR  06/2004   L3, L4 lami; L2, L5 hemilami; decompresion L2-3, 3-4, 4-  . FEMORAL BYPASS  before 2003   LLE  . I & D EXTREMITY Left 05/24/2017   Procedure: IRRIGATION AND DEBRIDEMENT LEFT LONG FINGER;  Surgeon: Dayna Barker, MD;  Location: [facility];   Service: Plastics;  Laterality: Left;  . INCISION AND DRAINAGE ANTERIOR NECK  10/2001   absces sternoclavicular joint    Family History  Problem Relation Age of Onset  . Stroke Mother   . Stroke Father    Social History   Socioeconomic History  . Marital status: Widowed    Spouse name: Not on file  . Number of children: Not on file  . Years of education: Not on file  . Highest education level: Not on file  Occupational History  . Not on file  Tobacco Use  . Smoking status: Never Smoker  . Smokeless tobacco: Never Used  Substance and Sexual Activity  . Alcohol use: No  . Drug use: No  . Sexual activity: Never  Other Topics Concern  . Not on file  Social History  Narrative   Lives with daughter and granddaughter    PCP: Dr. Reinaldo Meeker in [place]         Social Determinants of Health   Financial Resource Strain:   . Difficulty of Paying Living Expenses:   Food Insecurity:   . Worried About Charity fundraiser in the Last Year:   . Arboriculturist in the Last Year:   Transportation Needs:   . Film/video editor (Medical):   Marland Kitchen Lack of Transportation (Non-Medical):   Physical Activity:   . Days of Exercise per Week:   . Minutes of Exercise per Session:   Stress:   . Feeling of Stress :   Social Connections:   . Frequency of Communication with Friends and Family:   . Frequency of Social Gatherings with Friends and Family:   . Attends Religious Services:   . Active Member of Clubs or Organizations:   . Attends Archivist Meetings:   Marland Kitchen Marital Status:     Review of Systems  Constitutional: Negative.   HENT: Negative.   Eyes: Negative.   Respiratory: Negative.   Cardiovascular: Negative.   Gastrointestinal: Negative.   Endocrine: Negative.   Genitourinary: Negative.   Musculoskeletal: Negative.   Psychiatric/Behavioral: Negative.      Objective:  BP (!) 98/58   Pulse 80   Temp (!) 97 F (36.1 C)   Resp 16   Ht 5' (1.524 m)   Wt 94  lb 12.8 oz (43 kg)   SpO2 91%   BMI 18.51 kg/m   BP/Weight 10/06/2019 01/22/2019 29/56/2130  Systolic BP 98 865 784  Diastolic BP 58 78 55  Wt. (Lbs) 94.8 92 -  BMI 18.51 17.38 -    Physical Exam Vitals reviewed.  Constitutional:      Appearance: Normal appearance.  HENT:     Head: Normocephalic and atraumatic.     Right Ear: Tympanic membrane normal.     Left Ear: Tympanic membrane normal.     Nose: Nose normal.     Mouth/Throat:     Mouth: Mucous membranes are dry.  Eyes:     Extraocular Movements: Extraocular movements intact.     Conjunctiva/sclera: Conjunctivae normal.     Pupils: Pupils are equal, round, and reactive to light.  Cardiovascular:     Rate and Rhythm: Normal rate and regular rhythm.     Pulses: Normal pulses.     Heart sounds: Normal heart sounds.  Pulmonary:     Effort: Pulmonary effort is normal.     Breath sounds: Normal breath sounds.  Abdominal:     General: Abdomen is flat. Bowel sounds are normal.     Palpations: Abdomen is soft.  Musculoskeletal:        General: Normal range of motion.     Cervical back: Normal range of motion and neck supple.  Skin:    General: Skin is warm and dry.     Capillary Refill: Capillary refill takes less than 2 seconds.  Neurological:     General: No focal deficit present.     Mental Status: She is alert and oriented to person, place, and time.  Psychiatric:        Mood and Affect: Mood normal.        Thought Content: Thought content normal.        Judgment: Judgment normal.     Diabetic Foot Exam - Simple   No data filed  Lab Results  Component Value Date   WBC 11.4 (H) 03/31/2018   HGB 9.6 (L) 03/31/2018   HCT 31.7 (L) 03/31/2018   PLT 133 (L) 03/31/2018   GLUCOSE 89 03/31/2018   CHOL 260 (H) 05/06/2016   TRIG 67 05/06/2016   HDL 70 05/06/2016   LDLCALC 177 (H) 05/06/2016   ALT 11 03/31/2018   AST 20 03/31/2018   NA 142 03/31/2018   K 4.1 03/31/2018   CL 113 (H) 03/31/2018    CREATININE 1.29 (H) 03/31/2018   BUN 20 03/31/2018   CO2 21 (L) 03/31/2018   TSH 0.597 07/19/2016   INR 1.09 06/25/2016   HGBA1C 5.9 (H) 08/26/2011      Assessment & Plan:   1. Mixed hyperlipidemia - Lipid panel AN INDIVIDUAL CARE PLAN for hyperlipidemia/ cholesterol was established and reinforced today.  The patient's status was assessed using clinical findings on exam, lab and other diagnostic tests. The patient's disease status was assessed based on evidence-based guidelines and found to be well controlled. MEDICATIONS were reviewed. SELF MANAGEMENT GOALS have been discussed and patient's success at attaining the goal of low cholesterol was assessed. RECOMMENDATION given include regular exercise 3 days a week and low cholesterol/low fat diet. CLINICAL SUMMARY including written plan to identify barriers unique to the patient due to social or economic  reasons was discussed.  2. Essential hypertension - CBC with Differential/Platelet - Comprehensive metabolic panel An individual hypertension care plan was established and reinforced today.  The patient's status was assessed using clinical findings on exam and labs or diagnostic tests. The patient's success at meeting treatment goals on disease specific evidence-based guidelines and found to be well controlled. SELF MANAGEMENT: The patient and I together assessed ways to personally work towards obtaining the recommended goals. RECOMMENDATIONS: avoid decongestants found in common cold remedies, decrease consumption of alcohol, perform routine monitoring of BP with home BP cuff, exercise, reduction of dietary salt, take medicines as prescribed, try not to miss doses and quit smoking.  Regular exercise and maintaining a healthy weight is needed.  Stress reduction may help. A CLINICAL SUMMARY including written plan identify barriers to care unique to individual due to social or financial issues.  We attempt to mutually creat solutions for  individual and family understanding.  3. Peripheral vascular disease ([place]) Patient is stable now and not having claudication  4. Coronary artery disease involving native coronary artery of native heart without angina pectoris An individual plan was formulated based on patient history and exam, labs and evidence based data. Patient has not had recent angina or nitroglycerin use. continue present treatment.  5. Adult BMI <19 kg/sq m Supplement nutrition with protein/calorie supplement with meals to improve nutritional status.  6. Malnutrition of moderate degree (HCC) Supplement nutrition with protein/calorie supplement with meals to improve nutritional status.    No orders of the defined types were placed in this encounter.   Orders Placed This Encounter  Procedures  . CBC with Differential/Platelet  . Comprehensive metabolic panel  . Lipid panel     Follow-up: Return in about 6 months (around 04/06/2020) for fasting.  An After Visit Summary was printed and given to the patient.  [place] [phone]

## 2019-10-07 LAB — CBC WITH DIFFERENTIAL/PLATELET
Basophils Absolute: 0.1 10*3/uL (ref 0.0–0.2)
Basos: 1 %
EOS (ABSOLUTE): 0.1 10*3/uL (ref 0.0–0.4)
Eos: 2 %
Hematocrit: 36.1 % (ref 34.0–46.6)
Hemoglobin: 11.9 g/dL (ref 11.1–15.9)
Immature Grans (Abs): 0 10*3/uL (ref 0.0–0.1)
Immature Granulocytes: 0 %
Lymphocytes Absolute: 1.8 10*3/uL (ref 0.7–3.1)
Lymphs: 28 %
MCH: 32.1 pg (ref 26.6–33.0)
MCHC: 33 g/dL (ref 31.5–35.7)
MCV: 97 fL (ref 79–97)
Monocytes Absolute: 0.6 10*3/uL (ref 0.1–0.9)
Monocytes: 9 %
Neutrophils Absolute: 4 10*3/uL (ref 1.4–7.0)
Neutrophils: 60 %
Platelets: 177 10*3/uL (ref 150–450)
RBC: 3.71 x10E6/uL — ABNORMAL LOW (ref 3.77–5.28)
RDW: 12.4 % (ref 11.7–15.4)
WBC: 6.7 10*3/uL (ref 3.4–10.8)

## 2019-10-07 LAB — COMPREHENSIVE METABOLIC PANEL
ALT: 11 IU/L (ref 0–32)
AST: 21 IU/L (ref 0–40)
Albumin/Globulin Ratio: 1.4 (ref 1.2–2.2)
Albumin: 4.3 g/dL (ref 3.5–4.6)
Alkaline Phosphatase: 152 IU/L — ABNORMAL HIGH (ref 48–121)
BUN/Creatinine Ratio: 22 (ref 12–28)
BUN: 32 mg/dL (ref 10–36)
Bilirubin Total: 0.2 mg/dL (ref 0.0–1.2)
CO2: 23 mmol/L (ref 20–29)
Calcium: 12.2 mg/dL — ABNORMAL HIGH (ref 8.7–10.3)
Chloride: 109 mmol/L — ABNORMAL HIGH (ref 96–106)
Creatinine, Ser: 1.46 mg/dL — ABNORMAL HIGH (ref 0.57–1.00)
GFR calc Af Amer: 36 mL/min/{1.73_m2} — ABNORMAL LOW (ref >59)
GFR calc non Af Amer: 31 mL/min/{1.73_m2} — ABNORMAL LOW (ref >59)
Globulin, Total: 3 g/dL (ref 1.5–4.5)
Glucose: 103 mg/dL — ABNORMAL HIGH (ref 65–99)
Potassium: 5.1 mmol/L (ref 3.5–5.2)
Sodium: 144 mmol/L (ref 134–144)
Total Protein: 7.3 g/dL (ref 6.0–8.5)

## 2019-10-07 LAB — CARDIOVASCULAR RISK ASSESSMENT

## 2019-10-07 LAB — LIPID PANEL
Chol/HDL Ratio: 4.5 ratio — ABNORMAL HIGH (ref 0.0–4.4)
Cholesterol, Total: 263 mg/dL — ABNORMAL HIGH (ref 100–199)
HDL: 59 mg/dL (ref >39)
LDL Chol Calc (NIH): 173 mg/dL — ABNORMAL HIGH (ref 0–99)
Triglycerides: 171 mg/dL — ABNORMAL HIGH (ref 0–149)
VLDL Cholesterol Cal: 31 mg/dL (ref 5–40)

## 2019-10-07 NOTE — Progress Notes (Signed)
CBC normal, glucose 103, kidney tests stable, liver tests ok, Cholesterol high- no changes due to age,  lp

## 2019-10-12 ENCOUNTER — Other Ambulatory Visit: Payer: Self-pay | Admitting: Legal Medicine

## 2019-11-13 DIAGNOSIS — Z23 Encounter for immunization: Secondary | ICD-10-CM | POA: Diagnosis not present

## 2019-12-04 DIAGNOSIS — Z23 Encounter for immunization: Secondary | ICD-10-CM | POA: Diagnosis not present

## 2019-12-08 ENCOUNTER — Other Ambulatory Visit: Payer: Self-pay | Admitting: Cardiology

## 2019-12-14 ENCOUNTER — Telehealth: Payer: Self-pay | Admitting: *Deleted

## 2019-12-14 NOTE — Telephone Encounter (Signed)
[name] stated, she will have her daughter call to schedule her appointment.

## 2020-01-10 ENCOUNTER — Other Ambulatory Visit: Payer: Self-pay | Admitting: Cardiology

## 2020-01-14 ENCOUNTER — Telehealth: Payer: Medicare Other

## 2020-01-20 ENCOUNTER — Telehealth: Payer: Self-pay | Admitting: Cardiology

## 2020-01-20 NOTE — Telephone Encounter (Signed)
*  STAT* If patient is at the pharmacy, call can be transferred to refill team.   1. Which medications need to be refilled? (please list name of each medication and dose if known)  pantoprazole (PROTONIX) 40 MG tablet   2. Which pharmacy/location (including street and city if local pharmacy) is medication to be sent to? [id number] - [place], [place]  3. Do they need a 30 day or 90 day supply? 30 days  Patient has appt with Dr. Percival Spanish 02/18/20. Medication was stopped by the patient's PCP. Daughter wants to know if the patient can have enough to last at least until she sees Dr. Percival Spanish

## 2020-01-21 MED ORDER — PANTOPRAZOLE SODIUM 40 MG PO TBEC
40.0000 mg | DELAYED_RELEASE_TABLET | Freq: Every day | ORAL | 1 refills | Status: DC
Start: 2020-01-21 — End: 2020-02-15

## 2020-01-21 NOTE — Telephone Encounter (Signed)
OK to fill one month but otherwise fill by PCP

## 2020-01-25 ENCOUNTER — Telehealth: Payer: Self-pay

## 2020-01-25 NOTE — Chronic Care Management (AMB) (Unsigned)
Chronic Care Management Pharmacy Assistant   Name: [name]  MRN: [id number] DOB: 1926/02/20  Reason for Encounter: Disease State/ Hypertension and Lipids Adherence Call.   Patient Questions:  1.  Have you seen any other providers since your last visit? Yes, OV- 09/02/19 Dr. Tama High (Ophthamology), OV - 10/06/19 Dr. Reinaldo Meeker (PCP).  2.  Any changes in your medicines or health? Yes, OV- 10/06/19 Dr. Reinaldo Meeker stopped Brimonidine 0.15%, Pantoprazole Sodium 40 Mg and, Ramipril 10 Mg.   PCP : Lillard Anes, MD  Allergies:   Allergies  Allergen Reactions  . Diphenhydramine Hcl Other (See Comments)    unknown  . Penicillins Other (See Comments)    unknown  . Shellfish Allergy Other (See Comments)    unknown    Medications: Outpatient Encounter Medications as of 01/25/2020  Medication Sig  . amLODipine (NORVASC) 5 MG tablet TAKE 1 TABLET BY MOUTH EVERY DAY  . Ascorbic Acid (VITAMIN C) 100 MG tablet Take by mouth daily.  Marland Kitchen aspirin 81 MG tablet Take 1 tablet (81 mg total) by mouth daily.  Marland Kitchen b complex vitamins tablet Take 1 tablet by mouth daily.  . bimatoprost (LUMIGAN) 0.01 % SOLN Place 1 drop into both eyes at bedtime.   Marland Kitchen BRILINTA 90 MG TABS tablet TAKE 1 TABLET BY MOUTH TWICE A DAY *SCHEDULE APPOINTMENT FOR MORE REFILLS*  . cholecalciferol (VITAMIN D3) 25 MCG (1000 UNIT) tablet Take by mouth daily.  . Coenzyme Q10 (COQ10) 50 MG CAPS Take 50 mg by mouth daily.  . dorzolamide (TRUSOPT) 2 % ophthalmic solution Place 1 drop into both eyes 3 (three) times daily.  . dorzolamide-timolol (COSOPT) 22.3-6.8 MG/ML ophthalmic solution 1 drop at bedtime.  Marland Kitchen ELDERBERRY PO Take by mouth daily.  Marland Kitchen MAGNESIUM GLUCONATE PO Take by mouth daily.  . metoprolol succinate (TOPROL-XL) 25 MG 24 hr tablet Take 3 tablets (75 mg total) by mouth daily.  . Multiple Vitamins-Minerals (ADULT ONE DAILY GUMMIES) CHEW Chew 2 tablets by mouth daily.  . nitroGLYCERIN (NITROSTAT)  0.4 MG SL tablet Place 1 tablet (0.4 mg total) under the tongue every 5 (five) minutes as needed. For chest pain  . pantoprazole (PROTONIX) 40 MG tablet Take 1 tablet (40 mg total) by mouth daily.  . ramipril (ALTACE) 10 MG capsule TAKE ONE CAPSULE BY MOUTH EVERY DAY  . traMADol (ULTRAM) 50 MG tablet TAKE 2 TABLETS BY MOUTH EVERY DAY  . [DISCONTINUED] simvastatin (ZOCOR) 40 MG tablet Take 1 tablet (40 mg total) by mouth at bedtime.   No facility-administered encounter medications on file as of 01/25/2020.    Current Diagnosis: Patient Active Problem List   Diagnosis Date Noted  . Adult BMI <19 kg/sq m 10/06/2019  . Malnutrition of moderate degree ([place]) 10/06/2019  . Dyslipidemia 01/21/2019  . Colitis, acute 03/30/2018  . History of ischemic colitis 03/30/2018  . Hypercalcemia 03/30/2018  . AKI (acute kidney injury) ([place]) 03/30/2018  . Finger infection   . Closed displaced fracture of distal phalanx of left middle finger 05/23/2017  . Chest pain at rest 07/21/2016  . Unstable angina ([place]) 07/19/2016  . Pancreatic mass   . Abnormal liver function test   . 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 History of TIA (transient ischemic attack)   . Depression 04/10/2011  . Peripheral vascular disease ([place]) 04/11/2008  . Hyperlipidemia   . Hypertension    Reviewed chart prior to disease state call. Spoke with patient regarding BP  Recent  Office Vitals: BP Readings from Last 3 Encounters:  10/06/19 (!) 98/58  01/22/19 (!) 149/78  03/31/18 (!) 109/55   Pulse Readings from Last 3 Encounters:  10/06/19 80  01/22/19 (!) 58  03/31/18 (!) 58    Wt Readings from Last 3 Encounters:  10/06/19 94 lb 12.8 oz (43 kg)  01/22/19 92 lb (41.7 kg)  03/30/18 95 lb (43.1 kg)     Kidney Function Lab Results  Component Value Date/Time   CREATININE 1.46 (H) 10/06/2019 03:20 PM   CREATININE 1.29 (H) 03/31/2018 06:55 AM   GFRNONAA 31 (L) 10/06/2019 03:20 PM   GFRAA 36 (L)  10/06/2019 03:20 PM    BMP Latest Ref Rng & Units 10/06/2019 03/31/2018 03/30/2018  Glucose 65 - 99 mg/dL 103(H) 89 160(H)  BUN 10 - 36 mg/dL 32 20 37(H)  Creatinine 0.57 - 1.00 mg/dL 1.46(H) 1.29(H) 1.65(H)  BUN/Creat Ratio 12 - 28 22 - -  Sodium 134 - 144 mmol/L 144 142 144  Potassium 3.5 - 5.2 mmol/L 5.1 4.1 4.9  Chloride 96 - 106 mmol/L 109(H) 113(H) 111  CO2 20 - 29 mmol/L 23 21(L) 20(L)  Calcium 8.7 - 10.3 mg/dL 12.2(H) 10.8(H) 11.5(H)    . Current antihypertensive regimen:   Ramipril 10 mg   Amlodipine 5 mg    Metoprolol succ 25 mg              Ramipril 10 mg   . How often are you checking your Blood Pressure?   .  Monitoring Frequency:  . Current home BP readings: They've   . What recent interventions/DTPs have been made by any provider to improve Blood Pressure control since last CPP Visit: ***  . Any recent hospitalizations or ED visits since last visit with CPP? {yes/no:20286}   . What diet changes have been made to improve Blood Pressure Control?  o *** . What exercise is being done to improve your Blood Pressure Control?  o ***  Adherence Review: Is the patient currently on ACE/ARB medication? {yes/no:20286} Does the patient have >5 day gap between last estimated fill dates? {yes/no:20286}  01/28/2020 Name: [name] MRN: [id number] DOB: 1925/08/10 [name] is a 86 y.o. year old female who is a primary care patient of Lillard Anes, MD.  Comprehensive medication review performed; Spoke to patient regarding cholesterol  Lipid Panel    Component Value Date/Time   CHOL 263 (H) 10/06/2019 1520   TRIG 171 (H) 10/06/2019 1520   HDL 59 10/06/2019 1520   LDLCALC 173 (H) 10/06/2019 1520    10-year ASCVD risk score: The ASCVD Risk score Mikey Bussing DC Jr., et al., 2013) failed to calculate for the following reasons:   The 2013 ASCVD risk score is only valid for ages 68 to 67   The patient has a prior MI or stroke diagnosis  . Current  antihyperlipidemic regimen:    brilinta 90 mg   nitroglycerin 0.4 mg    coq10 50 mg  amlodipine 5 mg   aspirin 81 mg . Previous antihyperlipidemic medications tried: simvastatin (stopped due to liver enzymes) . ASCVD risk enhancing conditions: age >56, DM, HTN, CKD, CHF, current smoker . What recent interventions/DTPs have been made by any provider to improve Cholesterol control since last CPP Visit: *** . Any recent hospitalizations or ED visits since last visit with CPP? {yes/no:20286} . What diet changes have been made to improve Cholesterol?  o *** . What exercise is being done to improve Cholesterol?  o ***  Adherence Review: Does the patient have >5 day gap between last estimated fill dates? {yes/no:20286}   Goals Addressed   None     Follow-Up:  Pharmacist Review   Sherre Poot CPP. Notified.  [place] Pharmacist Assistant  [phone]

## 2020-02-15 ENCOUNTER — Other Ambulatory Visit: Payer: Self-pay | Admitting: Cardiology

## 2020-02-17 DIAGNOSIS — I2510 Atherosclerotic heart disease of native coronary artery without angina pectoris: Secondary | ICD-10-CM | POA: Insufficient documentation

## 2020-02-17 NOTE — Progress Notes (Signed)
HPI The patient presents for followup of known coronary disease. She was in the hospital in Jan 2018 with NSTEMI. She had an acute inferior lateral STEMI.   This was felt to be secondary to RCA occlusion. She had stenting of distal RCA into PDA with DES. The remainder of RCA was treated with POBA. She does have residual disease in mid LAD up to 90%. This is a heavily calcified vessel. Per Dr. Martinique  the left main disease is significant.  The LAD disease is acomplex lesion and would require atherectomy. Given her age the decision was to manage medically but if she has recurrent angina it was thought that it would be reasonable to consider PCI of LAD.  She was started on a statin. She came back to the hospital with chest pain but this time was found to have elevated liver enzymes.  She was taken off statin.     Since I last saw her she has done well.  She lives by herself.  She does household chores such as making her bed. The patient denies any new symptoms such as chest discomfort, neck or arm discomfort. There has been no new shortness of breath, PND or orthopnea. There have been no reported palpitations, presyncope or syncope.    Allergies  Allergen Reactions  . Diphenhydramine Hcl Other (See Comments)    unknown  . Penicillins Other (See Comments)    unknown  . Shellfish Allergy Other (See Comments)    unknown    Current Outpatient Medications  Medication Sig Dispense Refill  . amLODipine (NORVASC) 5 MG tablet TAKE 1 TABLET BY MOUTH EVERY DAY 90 tablet 0  . Ascorbic Acid (VITAMIN C) 100 MG tablet Take by mouth daily.    Marland Kitchen aspirin 81 MG tablet Take 1 tablet (81 mg total) by mouth daily.    Marland Kitchen b complex vitamins tablet Take 1 tablet by mouth daily.    Marland Kitchen BRILINTA 90 MG TABS tablet TAKE 1 TABLET BY MOUTH TWICE A DAY *SCHEDULE APPOINTMENT FOR MORE REFILLS* 180 tablet 0  . cholecalciferol (VITAMIN D3) 25 MCG (1000 UNIT) tablet Take by mouth daily.    . Coenzyme Q10 (COQ10) 50 MG CAPS  Take 50 mg by mouth daily.    . dorzolamide-timolol (COSOPT) 22.3-6.8 MG/ML ophthalmic solution 1 drop at bedtime.    Marland Kitchen ELDERBERRY PO Take by mouth daily.    Marland Kitchen MAGNESIUM GLUCONATE PO Take by mouth daily.    . metoprolol succinate (TOPROL-XL) 25 MG 24 hr tablet Take 3 tablets (75 mg total) by mouth daily. 90 tablet 9  . Multiple Vitamins-Minerals (ADULT ONE DAILY GUMMIES) CHEW Chew 2 tablets by mouth daily.    . nitroGLYCERIN (NITROSTAT) 0.4 MG SL tablet Place 1 tablet (0.4 mg total) under the tongue every 5 (five) minutes as needed. For chest pain 25 tablet 2  . ramipril (ALTACE) 10 MG capsule TAKE ONE CAPSULE BY MOUTH EVERY DAY 90 capsule 2  . traMADol (ULTRAM) 50 MG tablet TAKE 2 TABLETS BY MOUTH EVERY DAY 60 tablet 2   No current facility-administered medications for this visit.    Past Medical History:  Diagnosis Date  . Abscess 2003   I&D SCM abscess  . Acute ischemic colitis ([place]) 04/10/11   acute episode of ischemic colitis, with abd pain, CT evidence of transverse colitis, and blood streaked BM.  Resolved with conservative treatment.  . Arthritis    "hands, joints, knees" (07/19/2016)  . Bacteremia 2003   hx  of staph bacteremia  . CAD (coronary artery disease) 2003, 2010   s/p stenting of RCA x2, s/p stent to OM;  LHC 04/2009 in the setting of a NSTEMI: EF 65%, RCA stent patent with 60% ISR proximal and 40% ISR mid, PLV proximal 50-60% and distal 50%, ostial circumflex 30-40%, proximal OM mild in-stent restenosis with a branch with 90+ percent ostial stenosis, proximal LAD 30-40%.  FFR of ostial circumflex:  0.96 - not flow limiting.  Medical therapy was continued.  . Chronic lower back pain    "not a big problem" (07/19/2016)  . Closed displaced fracture of distal phalanx of left middle finger 05/23/2017  . Degenerative disc disease   . GERD (gastroesophageal reflux disease)   . History of medication noncompliance    concerning Plavix  . HTN (hypertension)    Echocardiogram  08/26/11: Mild focal basal septal hypertrophy, EF 25-85%, grade 2 diastolic dysfunction, mild MR.  There was a fluid-filled area likely in the location of the liver noted  . Hyperlipidemia   . Hyperparathyroidism 2003   parathyroid adenoma localized to the right inferior thyroid lobe region  . Hypothyroidism   . Left renal artery stenosis (HCC)    40%  . Lumbar stenosis with neurogenic claudication 2006   s/p decompression L2-L5  . Myocardial infarction [facility]) ~ 2013; 05/06/2016  . Osteoporosis 2009   diagnosed by bone density scan in 2009, on Vit D only, no bisphosphonate therapy  . Phlebitis    LLE  . PVD (peripheral vascular disease) (HCC)    s/p left fem-pop bypass graft  . TIA (transient ischemic attack)    carotid dopplers 5/13: no ICA stenosis    Past Surgical History:  Procedure Laterality Date  . AMPUTATION FINGER Left 05/24/2017   Procedure: AMPUTATION tip of long FINGER;  Surgeon: Dayna Barker, MD;  Location: [place];  Service: Plastics;  Laterality: Left;  . CARDIAC CATHETERIZATION N/A 05/06/2016   Procedure: Left Heart Cath and Coronary Angiography;  Surgeon: Lorretta Harp, MD;  Location: [place] [facility];  Service: Cardiovascular;  Laterality: N/A;  . CARDIAC CATHETERIZATION N/A 05/06/2016   Procedure: Coronary Stent Intervention;  Surgeon: Lorretta Harp, MD;  Location: [place] [facility];  Service: Cardiovascular;  Laterality: N/A;  RCA  . CATARACT EXTRACTION, BILATERAL Bilateral   . CORONARY ANGIOPLASTY WITH STENT PLACEMENT  09/2008  . CORONARY ANGIOPLASTY WITH STENT PLACEMENT  04/2001  . ENDOSCOPIC HEMILAMINOTOMY W/ DISCECTOMY LUMBAR  06/2004   L3, L4 lami; L2, L5 hemilami; decompresion L2-3, 3-4, 4-  . FEMORAL BYPASS  before 2003   LLE  . I & D EXTREMITY Left 05/24/2017   Procedure: IRRIGATION AND DEBRIDEMENT LEFT LONG FINGER;  Surgeon: Dayna Barker, MD;  Location: [place];  Service: Plastics;  Laterality: Left;  . INCISION AND DRAINAGE ANTERIOR NECK  10/2001    absces sternoclavicular joint    ROS:    As stated in the HPI and negative for all other systems.   PHYSICAL EXAM BP (!) 158/81   Pulse (!) 55   Ht 5\' 4"  (1.626 m)   Wt 100 lb (45.4 kg)   SpO2 100%   BMI 17.16 kg/m   GENERAL:  Well appearing NECK:  No jugular venous distention, waveform within normal limits, carotid upstroke brisk and symmetric, no bruits, no thyromegaly LUNGS:  Clear to auscultation bilaterally CHEST:  Unremarkable HEART:  PMI not displaced or sustained,S1 and S2 within normal limits, no S3, no S4, no clicks, no rubs,  no murmurs ABD:  Flat, positive bowel sounds normal in frequency in pitch, no bruits, no rebound, no guarding, no midline pulsatile mass, no hepatomegaly, no splenomegaly EXT:  2 plus pulses throughout, no edema, no cyanosis no clubbing   EKG: Sinus rhythm, rate 55, axis within normal limits, intervals within normal limits, no acute ST-T wave changes.  02/18/2020  ASSESSMENT AND PLAN  CAD:    The patient has no new sypmtoms.  No further cardiovascular testing is indicated.  We will continue with aggressive risk reduction and meds as listed.I had long discussions about using the combination of Brilinta and aspirin.  This is preferred in her situation because of her severe disease.  She has no contraindication to dual antiplatelet therapy and has a stable hemoglobin.  I looked at these results earlier this year.  After further considering this I will continue dual antiplatelet therapy unless she has a contraindication in the future.   HTN:  The blood pressure is never elevated like this.  She typically low but she did not take her medication this morning.  No change in therapy.   DYSLIPIDEMIA:     She is not tolerant of statins.  No change in therapy.  POLYPHARMACY: Her family requested that she come off her pantoprazole to see if she still needs this.  I think this is very reasonable.

## 2020-02-18 ENCOUNTER — Other Ambulatory Visit: Payer: Self-pay

## 2020-02-18 ENCOUNTER — Encounter: Payer: Self-pay | Admitting: Cardiology

## 2020-02-18 ENCOUNTER — Ambulatory Visit (INDEPENDENT_AMBULATORY_CARE_PROVIDER_SITE_OTHER): Payer: Medicare Other | Admitting: Cardiology

## 2020-02-18 VITALS — BP 158/81 | HR 55 | Ht 64.0 in | Wt 100.0 lb

## 2020-02-18 DIAGNOSIS — I2510 Atherosclerotic heart disease of native coronary artery without angina pectoris: Secondary | ICD-10-CM | POA: Diagnosis not present

## 2020-02-18 DIAGNOSIS — I10 Essential (primary) hypertension: Secondary | ICD-10-CM

## 2020-02-18 DIAGNOSIS — E785 Hyperlipidemia, unspecified: Secondary | ICD-10-CM | POA: Diagnosis not present

## 2020-02-18 NOTE — Patient Instructions (Signed)
Medication Instructions:  STOP Pantoprazole *If you need a refill on your cardiac medications before your next appointment, please call your pharmacy*   Lab Work: None ordered If you have labs (blood work) drawn today and your tests are completely normal, you will receive your results only by: Marland Kitchen MyChart Message (if you have MyChart) OR . A paper copy in the mail If you have any lab test that is abnormal or we need to change your treatment, we will call you to review the results.   Testing/Procedures: None ordered   Follow-Up: At [facility], you and your health needs are our priority.  As part of our continuing mission to provide you with exceptional heart care, we have created designated Provider Care Teams.  These Care Teams include your primary Cardiologist (physician) and Advanced Practice Providers (APPs -  Physician Assistants and Nurse Practitioners) who all work together to provide you with the care you need, when you need it.  We recommend signing up for the patient portal called "MyChart".  Sign up information is provided on this After Visit Summary.  MyChart is used to connect with patients for Virtual Visits (Telemedicine).  Patients are able to view lab/test results, encounter notes, upcoming appointments, etc.  Non-urgent messages can be sent to your provider as well.   To learn more about what you can do with MyChart, go to [email or web address].    Your next appointment:   12 month(s)  The format for your next appointment:   In Person  Provider:   Minus Breeding, MD   Other Instructions None

## 2020-04-04 ENCOUNTER — Other Ambulatory Visit: Payer: Self-pay | Admitting: Legal Medicine

## 2020-04-04 ENCOUNTER — Other Ambulatory Visit: Payer: Self-pay

## 2020-04-04 ENCOUNTER — Other Ambulatory Visit: Payer: Self-pay | Admitting: Cardiology

## 2020-04-04 MED ORDER — TICAGRELOR 90 MG PO TABS: ORAL_TABLET | ORAL | 3 refills | Status: DC

## 2020-04-04 MED ORDER — METOPROLOL SUCCINATE ER 25 MG PO TB24: 75.0000 mg | ORAL_TABLET | Freq: Every day | ORAL | 3 refills | Status: DC

## 2020-04-04 NOTE — Telephone Encounter (Signed)
   *  STAT* If patient is at the pharmacy, call can be transferred to refill team.   1. Which medications need to be refilled? (please list name of each medication and dose if known) metoprolol succinate (TOPROL-XL) 25 MG 24 hr tablet  BRILINTA 90 MG TABS tablet    2. Which pharmacy/location (including street and city if local pharmacy) is medication to be sent to?[id number] - [place], [place]  3. Do they need a 30 day or 90 day supply? 90 days

## 2020-04-05 ENCOUNTER — Other Ambulatory Visit: Payer: Self-pay

## 2020-04-06 MED ORDER — TRAMADOL HCL 50 MG PO TABS: 100.0000 mg | ORAL_TABLET | Freq: Every day | ORAL | 2 refills | Status: DC

## 2020-04-10 ENCOUNTER — Ambulatory Visit: Payer: Medicare Other | Admitting: Legal Medicine

## 2020-05-04 ENCOUNTER — Encounter: Payer: Self-pay | Admitting: Legal Medicine

## 2020-05-04 ENCOUNTER — Other Ambulatory Visit: Payer: Self-pay

## 2020-05-04 ENCOUNTER — Ambulatory Visit (INDEPENDENT_AMBULATORY_CARE_PROVIDER_SITE_OTHER): Payer: Medicare Other | Admitting: Legal Medicine

## 2020-05-04 VITALS — BP 112/60 | HR 70 | Temp 97.2°F | Resp 16 | Ht 64.0 in | Wt 93.0 lb

## 2020-05-04 DIAGNOSIS — E782 Mixed hyperlipidemia: Secondary | ICD-10-CM

## 2020-05-04 DIAGNOSIS — E440 Moderate protein-calorie malnutrition: Secondary | ICD-10-CM | POA: Diagnosis not present

## 2020-05-04 DIAGNOSIS — I2510 Atherosclerotic heart disease of native coronary artery without angina pectoris: Secondary | ICD-10-CM

## 2020-05-04 DIAGNOSIS — I10 Essential (primary) hypertension: Secondary | ICD-10-CM

## 2020-05-04 DIAGNOSIS — R531 Weakness: Secondary | ICD-10-CM

## 2020-05-04 DIAGNOSIS — I739 Peripheral vascular disease, unspecified: Secondary | ICD-10-CM | POA: Diagnosis not present

## 2020-05-04 NOTE — Progress Notes (Signed)
Subjective:  Patient ID: [name], female    DOB: 05-12-1925  Age: 87 y.o. MRN: [id number]  Chief Complaint  Patient presents with  . Hypertension  . Hyperlipidemia    HPI: Chronic visit  Patient presents for follow up of hypertension.  Patient tolerating amlodipine, metoprolol, ramiril well with side effects.  Patient was diagnosed with hypertension 2010 so has been treated for hypertension for 10 years.Patient is working on maintaining diet and exercise regimen and follows up as directed. Complication include cad.  Patient presents with hyperlipidemia.  Compliance with treatment has been good; patient takes medicines as directed, maintains low cholesterol diet, follows up as directed, and maintains exercise regimen.  Patient is using none without problems.   Current Outpatient Medications on File Prior to Visit  Medication Sig Dispense Refill  . amLODipine (NORVASC) 5 MG tablet TAKE 1 TABLET BY MOUTH EVERY DAY 90 tablet 0  . Ascorbic Acid (VITAMIN C) 100 MG tablet Take by mouth daily.    Marland Kitchen aspirin 81 MG tablet Take 1 tablet (81 mg total) by mouth daily.    Marland Kitchen b complex vitamins tablet Take 1 tablet by mouth daily.    . cholecalciferol (VITAMIN D3) 25 MCG (1000 UNIT) tablet Take by mouth daily.    . Coenzyme Q10 (COQ10) 50 MG CAPS Take 50 mg by mouth daily.    . dorzolamide-timolol (COSOPT) 22.3-6.8 MG/ML ophthalmic solution 1 drop at bedtime.    Marland Kitchen ELDERBERRY PO Take by mouth daily.    Marland Kitchen MAGNESIUM GLUCONATE PO Take by mouth daily.    . metoprolol succinate (TOPROL-XL) 25 MG 24 hr tablet Take 3 tablets (75 mg total) by mouth daily. 270 tablet 3  . Multiple Vitamins-Minerals (ADULT ONE DAILY GUMMIES) CHEW Chew 2 tablets by mouth daily.    . nitroGLYCERIN (NITROSTAT) 0.4 MG SL tablet Place 1 tablet (0.4 mg total) under the tongue every 5 (five) minutes as needed. For chest pain 25 tablet 2  . ramipril (ALTACE) 10 MG capsule TAKE ONE CAPSULE BY MOUTH EVERY DAY 90 capsule 2  .  ticagrelor (BRILINTA) 90 MG TABS tablet TAKE 1 TABLET BY MOUTH TWICE A DAY 180 tablet 3  . traMADol (ULTRAM) 50 MG tablet Take 2 tablets (100 mg total) by mouth daily. 60 tablet 2  . [DISCONTINUED] simvastatin (ZOCOR) 40 MG tablet Take 1 tablet (40 mg total) by mouth at bedtime. 30 tablet 0   No current facility-administered medications on file prior to visit.   Past Medical History:  Diagnosis Date  . Abscess 2003   I&D SCM abscess  . Acute ischemic colitis ([place]) 04/10/11   acute episode of ischemic colitis, with abd pain, CT evidence of transverse colitis, and blood streaked BM.  Resolved with conservative treatment.  . Arthritis    "hands, joints, knees" (07/19/2016)  . Bacteremia 2003   hx of staph bacteremia  . CAD (coronary artery disease) 2003, 2010   s/p stenting of RCA x2, s/p stent to OM;  LHC 04/2009 in the setting of a NSTEMI: EF 65%, RCA stent patent with 60% ISR proximal and 40% ISR mid, PLV proximal 50-60% and distal 50%, ostial circumflex 30-40%, proximal OM mild in-stent restenosis with a branch with 90+ percent ostial stenosis, proximal LAD 30-40%.  FFR of ostial circumflex:  0.96 - not flow limiting.  Medical therapy was continued.  . Chronic lower back pain    "not a big problem" (07/19/2016)  . Closed displaced fracture of distal phalanx of left  middle finger 05/23/2017  . Degenerative disc disease   . GERD (gastroesophageal reflux disease)   . History of medication noncompliance    concerning Plavix  . HTN (hypertension)    Echocardiogram 08/26/11: Mild focal basal septal hypertrophy, EF 79-39%, grade 2 diastolic dysfunction, mild MR.  There was a fluid-filled area likely in the location of the liver noted  . Hyperlipidemia   . Hyperparathyroidism 2003   parathyroid adenoma localized to the right inferior thyroid lobe region  . Hypothyroidism   . Left renal artery stenosis (HCC)    40%  . Lumbar stenosis with neurogenic claudication 2006   s/p decompression L2-L5  .  Myocardial infarction [facility]) ~ 2013; 05/06/2016  . Osteoporosis 2009   diagnosed by bone density scan in 2009, on Vit D only, no bisphosphonate therapy  . Phlebitis    LLE  . PVD (peripheral vascular disease) (HCC)    s/p left fem-pop bypass graft  . TIA (transient ischemic attack)    carotid dopplers 5/13: no ICA stenosis   Past Surgical History:  Procedure Laterality Date  . AMPUTATION FINGER Left 05/24/2017   Procedure: AMPUTATION tip of long FINGER;  Surgeon: Dayna Barker, MD;  Location: [place];  Service: Plastics;  Laterality: Left;  . CARDIAC CATHETERIZATION N/A 05/06/2016   Procedure: Left Heart Cath and Coronary Angiography;  Surgeon: Lorretta Harp, MD;  Location: [place] [facility];  Service: Cardiovascular;  Laterality: N/A;  . CARDIAC CATHETERIZATION N/A 05/06/2016   Procedure: Coronary Stent Intervention;  Surgeon: Lorretta Harp, MD;  Location: [place] [facility];  Service: Cardiovascular;  Laterality: N/A;  RCA  . CATARACT EXTRACTION, BILATERAL Bilateral   . CORONARY ANGIOPLASTY WITH STENT PLACEMENT  09/2008  . CORONARY ANGIOPLASTY WITH STENT PLACEMENT  04/2001  . ENDOSCOPIC HEMILAMINOTOMY W/ DISCECTOMY LUMBAR  06/2004   L3, L4 lami; L2, L5 hemilami; decompresion L2-3, 3-4, 4-  . FEMORAL BYPASS  before 2003   LLE  . I & D EXTREMITY Left 05/24/2017   Procedure: IRRIGATION AND DEBRIDEMENT LEFT LONG FINGER;  Surgeon: Dayna Barker, MD;  Location: [place];  Service: Plastics;  Laterality: Left;  . INCISION AND DRAINAGE ANTERIOR NECK  10/2001   absces sternoclavicular joint    Family History  Problem Relation Age of Onset  . Stroke Mother   . Stroke Father    Social History   Socioeconomic History  . Marital status: Widowed    Spouse name: Not on file  . Number of children: Not on file  . Years of education: Not on file  . Highest education level: Not on file  Occupational History  . Not on file  Tobacco Use  . Smoking status: Never Smoker  . Smokeless tobacco: Never  Used  Substance and Sexual Activity  . Alcohol use: No  . Drug use: No  . Sexual activity: Never  Other Topics Concern  . Not on file  Social History Narrative   Lives with daughter and granddaughter    PCP: Dr. Reinaldo Meeker in [place]         Social Determinants of Health   Financial Resource Strain: Not on file  Food Insecurity: Not on file  Transportation Needs: Not on file  Physical Activity: Not on file  Stress: Not on file  Social Connections: Not on file    Review of Systems  Constitutional: Negative for activity change and appetite change.  HENT: Negative for congestion and sore throat.   Respiratory: Negative for apnea, chest  tightness and shortness of breath.   Cardiovascular: Negative for chest pain and leg swelling.  Gastrointestinal: Negative.  Negative for abdominal distention and abdominal pain.  Endocrine: Negative for polyuria.  Genitourinary: Negative for difficulty urinating, dyspareunia and urgency.  Musculoskeletal: Negative for arthralgias and back pain.  Skin: Negative.   Neurological: Negative.   Psychiatric/Behavioral: Positive for dysphoric mood.     Objective:  BP 112/60 (BP Location: Left Arm, Patient Position: Sitting, Cuff Size: Small)   Pulse 70   Temp (!) 97.2 F (36.2 C) (Temporal)   Resp 16   Ht 5\' 4"  (1.626 m)   Wt 93 lb (42.2 kg)   BMI 15.96 kg/m   BP/Weight 05/04/2020 02/18/2020 08/30/15  Systolic BP 494 496 98  Diastolic BP 60 81 58  Wt. (Lbs) 93 100 94.8  BMI 15.96 17.16 18.51    Physical Exam Vitals reviewed.  Constitutional:      General: She is not in acute distress.    Appearance: Normal appearance.  HENT:     Right Ear: Tympanic membrane, ear canal and external ear normal.     Left Ear: Tympanic membrane, ear canal and external ear normal.     Mouth/Throat:     Mouth: Mucous membranes are moist.     Pharynx: Oropharynx is clear.  Eyes:     Extraocular Movements: Extraocular movements intact.      Conjunctiva/sclera: Conjunctivae normal.     Pupils: Pupils are equal, round, and reactive to light.  Cardiovascular:     Rate and Rhythm: Normal rate and regular rhythm.     Pulses: Normal pulses.     Heart sounds: Normal heart sounds.  Pulmonary:     Effort: Pulmonary effort is normal. No respiratory distress.     Breath sounds: Normal breath sounds. No wheezing.  Abdominal:     General: Abdomen is flat. Bowel sounds are normal. There is no distension.     Tenderness: There is no abdominal tenderness.  Musculoskeletal:        General: No tenderness or signs of injury. Normal range of motion.     Comments: kyposcoliosis  Skin:    General: Skin is warm and dry.     Capillary Refill: Capillary refill takes less than 2 seconds.  Neurological:     General: No focal deficit present.     Mental Status: She is alert and oriented to person, place, and time.     Motor: Weakness present.  Psychiatric:        Mood and Affect: Mood normal.       Lab Results  Component Value Date   WBC 7.9 05/04/2020   HGB 11.8 05/04/2020   HCT 36.0 05/04/2020   PLT 198 05/04/2020   GLUCOSE 98 05/04/2020   CHOL 285 (H) 05/04/2020   TRIG 204 (H) 05/04/2020   HDL 62 05/04/2020   LDLCALC 185 (H) 05/04/2020   ALT 11 05/04/2020   AST 21 05/04/2020   NA 143 05/04/2020   K 5.3 (H) 05/04/2020   CL 108 (H) 05/04/2020   CREATININE 1.28 (H) 05/04/2020   BUN 27 05/04/2020   CO2 22 05/04/2020   TSH 0.495 05/04/2020   INR 1.09 06/25/2016   HGBA1C 5.9 (H) 08/26/2011      Assessment & Plan:   1. Mixed hyperlipidemia AN INDIVIDUAL CARE PLAN for hyperlipidemia/ cholesterol was established and reinforced today.  The patient's status was assessed using clinical findings on exam, lab and other diagnostic tests. The patient's disease  status was assessed based on evidence-based guidelines and found to be well controlled. MEDICATIONS were reviewed. SELF MANAGEMENT GOALS have been discussed and patient's  success at attaining the goal of low cholesterol was assessed. RECOMMENDATION given include regular exercise 3 days a week and low cholesterol/low fat diet. CLINICAL SUMMARY including written plan to identify barriers unique to the patient due to social or economic  reasons was discussed.  2. Essential hypertension An individual hypertension care plan was established and reinforced today.  The patient's status was assessed using clinical findings on exam and labs or diagnostic tests. The patient's success at meeting treatment goals on disease specific evidence-based guidelines and found to be well controlled. SELF MANAGEMENT: The patient and I together assessed ways to personally work towards obtaining the recommended goals. RECOMMENDATIONS: avoid decongestants found in common cold remedies, decrease consumption of alcohol, perform routine monitoring of BP with home BP cuff, exercise, reduction of dietary salt, take medicines as prescribed, try not to miss doses and quit smoking.  Regular exercise and maintaining a healthy weight is needed.  Stress reduction may help. A CLINICAL SUMMARY including written plan identify barriers to care unique to individual due to social or financial issues.  We attempt to mutually creat solutions for individual and family understanding.  3. Peripheral vascular disease (HCC) No further claudication and can walk well  4. Coronary artery disease involving native coronary artery of native heart without angina pectoris Patient's CAD was assessed using history and physical along with other information to maximize treatment.  Evidence based criteria was use in deciding proper management for this disease process.  Patient's CAD is under good control.therapy continue present treatment. 5. Malnutrition of moderate degree (HCC) Supplement nutrition with protein/calorie supplement with meals to improve nutritional status.      Orders Placed This Encounter  Procedures  .  CBC with Differential/Platelet  . Comprehensive metabolic panel  . Lipid panel  . TSH  . Cardiovascular Risk Assessment      I spent 30 minutes dedicated to the care of this patient on the date of this encounter to include face-to-face time with the patient, as well as: review old records  Follow-up: Return in about 6 months (around 11/01/2020).  An After Visit Summary was printed and given to the patient.  Reinaldo Meeker, MD [name] [phone]

## 2020-05-05 ENCOUNTER — Other Ambulatory Visit: Payer: Self-pay

## 2020-05-05 DIAGNOSIS — E875 Hyperkalemia: Secondary | ICD-10-CM

## 2020-05-05 LAB — LIPID PANEL
Chol/HDL Ratio: 4.6 ratio — ABNORMAL HIGH (ref 0.0–4.4)
Cholesterol, Total: 285 mg/dL — ABNORMAL HIGH (ref 100–199)
HDL: 62 mg/dL (ref >39)
LDL Chol Calc (NIH): 185 mg/dL — ABNORMAL HIGH (ref 0–99)
Triglycerides: 204 mg/dL — ABNORMAL HIGH (ref 0–149)
VLDL Cholesterol Cal: 38 mg/dL (ref 5–40)

## 2020-05-05 LAB — COMPREHENSIVE METABOLIC PANEL
ALT: 11 IU/L (ref 0–32)
AST: 21 IU/L (ref 0–40)
Albumin/Globulin Ratio: 1.3 (ref 1.2–2.2)
Albumin: 4.1 g/dL (ref 3.5–4.6)
Alkaline Phosphatase: 150 IU/L — ABNORMAL HIGH (ref 44–121)
BUN/Creatinine Ratio: 21 (ref 12–28)
BUN: 27 mg/dL (ref 10–36)
Bilirubin Total: <0.2 mg/dL (ref 0.0–1.2)
CO2: 22 mmol/L (ref 20–29)
Calcium: 12.5 mg/dL — ABNORMAL HIGH (ref 8.7–10.3)
Chloride: 108 mmol/L — ABNORMAL HIGH (ref 96–106)
Creatinine, Ser: 1.28 mg/dL — ABNORMAL HIGH (ref 0.57–1.00)
GFR calc Af Amer: 42 mL/min/{1.73_m2} — ABNORMAL LOW (ref >59)
GFR calc non Af Amer: 36 mL/min/{1.73_m2} — ABNORMAL LOW (ref >59)
Globulin, Total: 3.2 g/dL (ref 1.5–4.5)
Glucose: 98 mg/dL (ref 65–99)
Potassium: 5.3 mmol/L — ABNORMAL HIGH (ref 3.5–5.2)
Sodium: 143 mmol/L (ref 134–144)
Total Protein: 7.3 g/dL (ref 6.0–8.5)

## 2020-05-05 LAB — CBC WITH DIFFERENTIAL/PLATELET
Basophils Absolute: 0.1 10*3/uL (ref 0.0–0.2)
Basos: 1 %
EOS (ABSOLUTE): 0.4 10*3/uL (ref 0.0–0.4)
Eos: 5 %
Hematocrit: 36 % (ref 34.0–46.6)
Hemoglobin: 11.8 g/dL (ref 11.1–15.9)
Immature Grans (Abs): 0 10*3/uL (ref 0.0–0.1)
Immature Granulocytes: 0 %
Lymphocytes Absolute: 1.8 10*3/uL (ref 0.7–3.1)
Lymphs: 23 %
MCH: 31.2 pg (ref 26.6–33.0)
MCHC: 32.8 g/dL (ref 31.5–35.7)
MCV: 95 fL (ref 79–97)
Monocytes Absolute: 0.6 10*3/uL (ref 0.1–0.9)
Monocytes: 7 %
Neutrophils Absolute: 5.1 10*3/uL (ref 1.4–7.0)
Neutrophils: 64 %
Platelets: 198 10*3/uL (ref 150–450)
RBC: 3.78 x10E6/uL (ref 3.77–5.28)
RDW: 12.6 % (ref 11.7–15.4)
WBC: 7.9 10*3/uL (ref 3.4–10.8)

## 2020-05-05 LAB — TSH: TSH: 0.495 u[IU]/mL (ref 0.450–4.500)

## 2020-05-05 LAB — CARDIOVASCULAR RISK ASSESSMENT

## 2020-05-05 NOTE — Progress Notes (Signed)
No anemia, kidney tests stable, potassium 5.3 recheck in one week, calcium high get PTH level when draw potassium, liver tests OK, Cholesterol high but due to age- no changes, thyroid tests normal,  lp

## 2020-05-30 ENCOUNTER — Telehealth: Payer: Self-pay

## 2020-05-30 NOTE — Progress Notes (Signed)
Chronic Care Management Pharmacy Assistant   Name: [name]  MRN: [id number] DOB: Sep 01, 1925  Reason for Encounter: Disease State for general adherence   Patient Questions:  1.  Have you seen any other providers since your last visit?  05/04/20-PCP  2.  Any changes in your medicines or health? No    PCP : Lillard Anes, MD  Allergies:   Allergies  Allergen Reactions  . Diphenhydramine Hcl Other (See Comments)    unknown  . Penicillins Other (See Comments)    unknown  . Shellfish Allergy Other (See Comments)    unknown    Medications: Outpatient Encounter Medications as of 05/30/2020  Medication Sig  . amLODipine (NORVASC) 5 MG tablet TAKE 1 TABLET BY MOUTH EVERY DAY  . Ascorbic Acid (VITAMIN C) 100 MG tablet Take by mouth daily.  Marland Kitchen aspirin 81 MG tablet Take 1 tablet (81 mg total) by mouth daily.  Marland Kitchen b complex vitamins tablet Take 1 tablet by mouth daily.  . cholecalciferol (VITAMIN D3) 25 MCG (1000 UNIT) tablet Take by mouth daily.  . Coenzyme Q10 (COQ10) 50 MG CAPS Take 50 mg by mouth daily.  . dorzolamide-timolol (COSOPT) 22.3-6.8 MG/ML ophthalmic solution 1 drop at bedtime.  Marland Kitchen ELDERBERRY PO Take by mouth daily.  Marland Kitchen MAGNESIUM GLUCONATE PO Take by mouth daily.  . metoprolol succinate (TOPROL-XL) 25 MG 24 hr tablet Take 3 tablets (75 mg total) by mouth daily.  . Multiple Vitamins-Minerals (ADULT ONE DAILY GUMMIES) CHEW Chew 2 tablets by mouth daily.  . nitroGLYCERIN (NITROSTAT) 0.4 MG SL tablet Place 1 tablet (0.4 mg total) under the tongue every 5 (five) minutes as needed. For chest pain  . ramipril (ALTACE) 10 MG capsule TAKE ONE CAPSULE BY MOUTH EVERY DAY  . ticagrelor (BRILINTA) 90 MG TABS tablet TAKE 1 TABLET BY MOUTH TWICE A DAY  . traMADol (ULTRAM) 50 MG tablet Take 2 tablets (100 mg total) by mouth daily.  . [DISCONTINUED] simvastatin (ZOCOR) 40 MG tablet Take 1 tablet (40 mg total) by mouth at bedtime.   No facility-administered encounter  medications on file as of 05/30/2020.    Current Diagnosis: Patient Active Problem List   Diagnosis Date Noted  . Coronary artery disease involving native coronary artery of native heart without angina pectoris 02/17/2020  . Adult BMI <19 kg/sq m 10/06/2019  . Malnutrition of moderate degree ([place]) 10/06/2019  . Dyslipidemia 01/21/2019  . Colitis, acute 03/30/2018  . History of ischemic colitis 03/30/2018  . Hypercalcemia 03/30/2018  . AKI (acute kidney injury) ([place]) 03/30/2018  . Finger infection   . Closed displaced fracture of distal phalanx of left middle finger 05/23/2017  . Chest pain at rest 07/21/2016  . Unstable angina ([place]) 07/19/2016  . Pancreatic mass   . Abnormal liver function test   . 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 History of TIA (transient ischemic attack)   . Depression 04/10/2011  . Peripheral vascular disease ([place]) 04/11/2008  . Hyperlipidemia   . Hypertension    Spoke with patient, she is doing well overall.  She stated she usually takes her blood sugar after she eats.  She did not take it yesterday and has not taken it yet today.  She stated she is doing better, she is able to stay active and does her housework.   She is taking her medications as directed and is not having any side effects or trouble getting her medications from the pharmacy.   Per adherence  gap no AWV has been done.  Follow-Up:  Pharmacist Review  Donette Larry, CPP notified  Clarita Leber, [place] Pharmacist Assistant [phone]

## 2020-06-08 ENCOUNTER — Telehealth: Payer: Self-pay

## 2020-06-08 NOTE — Progress Notes (Signed)
    Chronic Care Management Pharmacy Assistant   Name: MAHAILA TISCHER  MRN: [id number] DOB: 20-Jul-1925  Reason for Encounter: Adherence review  Patient Questions:  1.  Have you seen any other providers since your last visit? No  2.  Any changes in your medicines or health? No    PCP : Lillard Anes, MD  Allergies:   Allergies  Allergen Reactions  . Diphenhydramine Hcl Other (See Comments)    unknown  . Penicillins Other (See Comments)    unknown  . Shellfish Allergy Other (See Comments)    unknown    Medications: Outpatient Encounter Medications as of 06/08/2020  Medication Sig  . amLODipine (NORVASC) 5 MG tablet TAKE 1 TABLET BY MOUTH EVERY DAY  . Ascorbic Acid (VITAMIN C) 100 MG tablet Take by mouth daily.  Marland Kitchen aspirin 81 MG tablet Take 1 tablet (81 mg total) by mouth daily.  Marland Kitchen b complex vitamins tablet Take 1 tablet by mouth daily.  . cholecalciferol (VITAMIN D3) 25 MCG (1000 UNIT) tablet Take by mouth daily.  . Coenzyme Q10 (COQ10) 50 MG CAPS Take 50 mg by mouth daily.  . dorzolamide-timolol (COSOPT) 22.3-6.8 MG/ML ophthalmic solution 1 drop at bedtime.  Marland Kitchen ELDERBERRY PO Take by mouth daily.  Marland Kitchen MAGNESIUM GLUCONATE PO Take by mouth daily.  . metoprolol succinate (TOPROL-XL) 25 MG 24 hr tablet Take 3 tablets (75 mg total) by mouth daily.  . Multiple Vitamins-Minerals (ADULT ONE DAILY GUMMIES) CHEW Chew 2 tablets by mouth daily.  . nitroGLYCERIN (NITROSTAT) 0.4 MG SL tablet Place 1 tablet (0.4 mg total) under the tongue every 5 (five) minutes as needed. For chest pain  . ramipril (ALTACE) 10 MG capsule TAKE ONE CAPSULE BY MOUTH EVERY DAY  . ticagrelor (BRILINTA) 90 MG TABS tablet TAKE 1 TABLET BY MOUTH TWICE A DAY  . traMADol (ULTRAM) 50 MG tablet Take 2 tablets (100 mg total) by mouth daily.  . [DISCONTINUED] simvastatin (ZOCOR) 40 MG tablet Take 1 tablet (40 mg total) by mouth at bedtime.   No facility-administered encounter medications on file as of 06/08/2020.     Current Diagnosis: Patient Active Problem List   Diagnosis Date Noted  . Coronary artery disease involving native coronary artery of native heart without angina pectoris 02/17/2020  . Adult BMI <19 kg/sq m 10/06/2019  . Malnutrition of moderate degree ([place]) 10/06/2019  . Dyslipidemia 01/21/2019  . Colitis, acute 03/30/2018  . History of ischemic colitis 03/30/2018  . Hypercalcemia 03/30/2018  . AKI (acute kidney injury) ([place]) 03/30/2018  . Finger infection   . Closed displaced fracture of distal phalanx of left middle finger 05/23/2017  . Chest pain at rest 07/21/2016  . Unstable angina ([place]) 07/19/2016  . Pancreatic mass   . Abnormal liver function test   . 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 History of TIA (transient ischemic attack)   . Depression 04/10/2011  . Peripheral vascular disease ([place]) 04/11/2008  . Hyperlipidemia   . Hypertension    Review of patients chart, the patient has her next appointment with Donette Larry, CPP on 07/25/20.  The patient has had no medication changes recently.   Follow-Up:  Pharmacist Review  Donette Larry, [place], [place] Pharmacist Assistant [phone]

## 2020-06-19 ENCOUNTER — Other Ambulatory Visit: Payer: Self-pay | Admitting: Legal Medicine

## 2020-06-26 ENCOUNTER — Other Ambulatory Visit: Payer: Self-pay

## 2020-06-26 MED ORDER — AMLODIPINE BESYLATE 5 MG PO TABS: 5.0000 mg | ORAL_TABLET | Freq: Every day | ORAL | 3 refills | Status: DC

## 2020-07-20 ENCOUNTER — Ambulatory Visit (INDEPENDENT_AMBULATORY_CARE_PROVIDER_SITE_OTHER): Payer: Medicare Other | Admitting: Podiatry

## 2020-07-20 ENCOUNTER — Other Ambulatory Visit: Payer: Self-pay

## 2020-07-20 ENCOUNTER — Encounter: Payer: Self-pay | Admitting: Podiatry

## 2020-07-20 DIAGNOSIS — I739 Peripheral vascular disease, unspecified: Secondary | ICD-10-CM

## 2020-07-20 DIAGNOSIS — M79674 Pain in right toe(s): Secondary | ICD-10-CM

## 2020-07-20 DIAGNOSIS — N179 Acute kidney failure, unspecified: Secondary | ICD-10-CM

## 2020-07-20 DIAGNOSIS — M79675 Pain in left toe(s): Secondary | ICD-10-CM

## 2020-07-20 DIAGNOSIS — B351 Tinea unguium: Secondary | ICD-10-CM | POA: Diagnosis not present

## 2020-07-20 NOTE — Addendum Note (Signed)
Addended by: Gardiner Barefoot on: 07/20/2020 11:50 AM   Modules accepted: Level of Service

## 2020-07-20 NOTE — Progress Notes (Signed)
This patient returns to my office for at risk foot care.  This patient requires this care by a professional since this patient will be at risk due to having acute kidney disease and PVD.  She presents to the office with her daughter.  This patient is unable to cut nails herself since the patient cannot reach her nails.These nails are painful walking and wearing shoes.  This patient presents for at risk foot care today.  General Appearance  Alert, conversant and in no acute stress.  Vascular  Dorsalis pedis and posterior tibial  pulses are not  palpable  bilaterally.  Capillary return is within normal limits  bilaterally. Temperature is within normal limits  bilaterally.  Neurologic  Senn-Weinstein monofilament wire test within normal limits  bilaterally. Muscle power within normal limits bilaterally.  Nails Thick disfigured discolored nails with subungual debris  from hallux to fifth toes bilaterally. No evidence of bacterial infection or drainage bilaterally.  Orthopedic  No limitations of motion  feet .  No crepitus or effusions noted.  No bony pathology or digital deformities noted.  HAV  B/L.    Skin  normotropic skin with no porokeratosis noted bilaterally.  No signs of infections or ulcers noted.     Onychomycosis  Pain in right toes  Pain in left toes  Consent was obtained for treatment procedures.   Mechanical debridement of nails 1-5  bilaterally performed with a nail nipper.  Filed with dremel without incident.    Return office visit   3 months                  Told patient to return for periodic foot care and evaluation due to potential at risk complications.   Gardiner Barefoot DPM

## 2020-07-27 ENCOUNTER — Other Ambulatory Visit: Payer: Self-pay

## 2020-07-27 ENCOUNTER — Ambulatory Visit (INDEPENDENT_AMBULATORY_CARE_PROVIDER_SITE_OTHER): Payer: Medicare Other

## 2020-07-27 DIAGNOSIS — I2510 Atherosclerotic heart disease of native coronary artery without angina pectoris: Secondary | ICD-10-CM

## 2020-07-27 DIAGNOSIS — E782 Mixed hyperlipidemia: Secondary | ICD-10-CM

## 2020-07-27 DIAGNOSIS — I10 Essential (primary) hypertension: Secondary | ICD-10-CM | POA: Diagnosis not present

## 2020-07-27 NOTE — Progress Notes (Signed)
Chronic Care Management Pharmacy Note  07/27/2020 Name:  [name] MRN:  [id number] DOB:  09/11/1925  Subjective: [name] is an 87 y.o. year old female who is a primary patient of Henrene Pastor, Zeb Comfort, MD.  The CCM team was consulted for assistance with disease management and care coordination needs.    Engaged with patient by telephone for follow up visit in response to provider referral for pharmacy case management and/or care coordination services.   Consent to Services:  The patient was given the following information about Chronic Care Management services today, agreed to services, and gave verbal consent: 1. CCM service includes personalized support from designated clinical staff supervised by the primary care provider, including individualized plan of care and coordination with other care providers 2. 24/7 contact phone numbers for assistance for urgent and routine care needs. 3. Service will only be billed when office clinical staff spend 20 minutes or more in a month to coordinate care. 4. Only one practitioner may furnish and bill the service in a calendar month. 5.The patient may stop CCM services at any time (effective at the end of the month) by phone call to the office staff. 6. The patient will be responsible for cost sharing (co-pay) of up to 20% of the service fee (after annual deductible is met). Patient agreed to services and consent obtained.  Patient Care Team: Lillard Anes, MD as PCP - General (Family Medicine) Minus Breeding, MD as PCP - Cardiology (Cardiology) Burnice Logan, [facility] as Pharmacist (Pharmacist)  Recent office visits: 05/04/2020 - No anemia, kidney tests stable, potassium 5.3 recheck in one week, calcium high get PTH level when draw potassium, liver tests OK, Cholesterol high but due to age- no changes, thyroid tests normal,  Recent consult visits: 07/20/2020 - podiatry - nail/foot care.  Hospital visits: None in previous 6  months  Objective:  Lab Results  Component Value Date   CREATININE 1.28 (H) 05/04/2020   BUN 27 05/04/2020   GFRNONAA 36 (L) 05/04/2020   GFRAA 42 (L) 05/04/2020   NA 143 05/04/2020   K 5.3 (H) 05/04/2020   CALCIUM 12.5 (H) 05/04/2020   CO2 22 05/04/2020   GLUCOSE 98 05/04/2020    Lab Results  Component Value Date/Time   HGBA1C 5.9 (H) 08/26/2011 06:25 AM    Last diabetic Eye exam: No results found for: HMDIABEYEEXA  Last diabetic Foot exam: No results found for: HMDIABFOOTEX   Lab Results  Component Value Date   CHOL 285 (H) 05/04/2020   HDL 62 05/04/2020   LDLCALC 185 (H) 05/04/2020   TRIG 204 (H) 05/04/2020   CHOLHDL 4.6 (H) 05/04/2020    Hepatic Function Latest Ref Rng & Units 05/04/2020 10/06/2019 03/31/2018  Total Protein 6.0 - 8.5 g/dL 7.3 7.3 5.7(L)  Albumin 3.5 - 4.6 g/dL 4.1 4.3 2.9(L)  AST 0 - 40 IU/L _0 ALT 0 - 32 IU/L _1 Alk Phosphatase 44 - 121 IU/L 150(H) 152(H) 85  Total Bilirubin 0.0 - 1.2 mg/dL <0.2 0.2 0.4  Bilirubin, Direct 0.1 - 0.5 mg/dL - - -    Lab Results  Component Value Date/Time   TSH 0.495 05/04/2020 12:00 AM   TSH 0.597 07/19/2016 09:13 PM   TSH 0.245 (L) 08/26/2011 06:25 AM    CBC Latest Ref Rng & Units 05/04/2020 10/06/2019 03/31/2018  WBC 3.4 - 10.8 x10E3/uL 7.9 6.7 11.4(H)  Hemoglobin 11.1 - 15.9 g/dL 11.8 11.9 9.6(L)  Hematocrit  34.0 - 46.6 % 36.0 36.1 31.7(L)  Platelets 150 - 450 x10E3/uL 198 177 133(L)    Lab Results  Component Value Date/Time   VD25OH 23.7 (L) 03/30/2018 03:26 PM    Clinical ASCVD: Yes  The ASCVD Risk score Mikey Bussing DC Jr., et al., 2013) failed to calculate for the following reasons:   The 2013 ASCVD risk score is only valid for ages 53 to 32   The patient has a prior MI or stroke diagnosis    Depression screen [facility] 2/9 10/06/2019  Decreased Interest 1  Down, Depressed, Hopeless 0  PHQ - 2 Score 1     Social History   Tobacco Use  Smoking Status Never Smoker  Smokeless Tobacco  Never Used   BP Readings from Last 3 Encounters:  07/20/20 139/63  05/04/20 112/60  02/18/20 (!) 158/81   Pulse Readings from Last 3 Encounters:  07/20/20 (!) 56  05/04/20 70  02/18/20 (!) 55   Wt Readings from Last 3 Encounters:  05/04/20 93 lb (42.2 kg)  02/18/20 100 lb (45.4 kg)  10/06/19 94 lb 12.8 oz (43 kg)   BMI Readings from Last 3 Encounters:  05/04/20 15.96 kg/m  02/18/20 17.16 kg/m  10/06/19 18.51 kg/m    Assessment/Interventions: Review of patient past medical history, allergies, medications, health status, including review of consultants reports, laboratory and other test data, was performed as part of comprehensive evaluation and provision of chronic care management services.   SDOH:  (Social Determinants of Health) assessments and interventions performed: Yes  SDOH Screenings   Alcohol Screen: Not on file  Depression (PHQ2-9): Low Risk   . PHQ-2 Score: 1  Financial Resource Strain: Not on file  Food Insecurity: No Food Insecurity  . Worried About Charity fundraiser in the Last Year: Never true  . Ran Out of Food in the Last Year: Never true  Housing: Low Risk   . Last Housing Risk Score: 0  Physical Activity: Not on file  Social Connections: Not on file  Stress: Not on file  Tobacco Use: Low Risk   . Smoking Tobacco Use: Never Smoker  . Smokeless Tobacco Use: Never Used  Transportation Needs: Not on file    CCM Care Plan  Allergies  Allergen Reactions  . Diphenhydramine Hcl Other (See Comments)    unknown  . Penicillins Other (See Comments)    unknown  . Shellfish Allergy Other (See Comments)    unknown    Medications Reviewed Today    Reviewed by Burnice Logan, [facility] (Pharmacist) on 07/27/20 at 1010  Med List Status: <None>  Medication Order Taking? Sig Documenting Provider Last Dose Status Informant  amLODipine (NORVASC) 5 MG tablet [id number] Yes Take 1 tablet (5 mg total) by mouth daily. Minus Breeding, MD Taking Active   Ascorbic  Acid (VITAMIN C) 100 MG tablet [id number] Yes Take by mouth daily. [provider] Taking Active Self  aspirin 81 MG tablet [id number] Yes Take 1 tablet (81 mg total) by mouth daily. Minus Breeding, MD Taking Active Child  b complex vitamins tablet [id number] Yes Take 1 tablet by mouth daily. [provider] Taking Active Self  cholecalciferol (VITAMIN D3) 25 MCG (1000 UNIT) tablet [id number] Yes Take by mouth daily. [provider] Taking Active Self  Coenzyme Q10 (COQ10) 50 MG CAPS [id number] Yes Take 50 mg by mouth daily. [provider] Taking Active Child  dorzolamide-timolol (COSOPT) 22.3-6.8 MG/ML ophthalmic solution [id number] Yes 1 drop at bedtime. [provider]  Taking Active Self  ELDERBERRY PO [id number] Yes Take by mouth daily. [provider] Taking Active Self  MAGNESIUM GLUCONATE PO [id number] Yes Take by mouth daily. [provider] Taking Active Self  metoprolol succinate (TOPROL-XL) 25 MG 24 hr tablet [id number] Yes Take 3 tablets (75 mg total) by mouth daily. Minus Breeding, MD Taking Active   Multiple Vitamins-Minerals (ADULT ONE DAILY GUMMIES) CHEW [id number] Yes Chew 2 tablets by mouth daily. [provider] Taking Active Child  nitroGLYCERIN (NITROSTAT) 0.4 MG SL tablet [id number] Yes Place 1 tablet (0.4 mg total) under the tongue every 5 (five) minutes as needed. For chest pain Minus Breeding, MD Taking Active Child  ramipril (ALTACE) 10 MG capsule [id number] Yes TAKE ONE CAPSULE BY MOUTH EVERY DAY Lillard Anes, MD Taking Active         Discontinued 06/20/11 1013 (Error)   ticagrelor (BRILINTA) 90 MG TABS tablet [id number] Yes TAKE 1 TABLET BY MOUTH TWICE A Kelton Pillar, MD Taking Active   traMADol (ULTRAM) 50 MG tablet [id number] Yes Take 2 tablets (100 mg total) by mouth daily. CoxElnita Maxwell, MD Taking Active           Patient Active Problem List   Diagnosis Date Noted  . Pain due to  onychomycosis of toenails of both feet 07/20/2020  . Coronary artery disease involving native coronary artery of native heart without angina pectoris 02/17/2020  . Adult BMI <19 kg/sq m 10/06/2019  . Malnutrition of moderate degree ([place]) 10/06/2019  . Dyslipidemia 01/21/2019  . Colitis, acute 03/30/2018  . History of ischemic colitis 03/30/2018  . Hypercalcemia 03/30/2018  . AKI (acute kidney injury) ([place]) 03/30/2018  . Finger infection   . Closed displaced fracture of distal phalanx of left middle finger 05/23/2017  . Chest pain at rest 07/21/2016  . Unstable angina ([place]) 07/19/2016  . Pancreatic mass   . Abnormal liver function test   . 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 History of TIA (transient ischemic attack)   . Depression 04/10/2011  . Peripheral vascular disease ([place]) 04/11/2008  . Hyperlipidemia   . Hypertension     Immunization History  Administered Date(s) Administered  . Tdap 08/26/2011    Conditions to be addressed/monitored:  Hypertension and Hyperlipidemia  Care Plan : [place]  Updates made by Burnice Logan, RPH since 07/27/2020 12:00 AM    Problem: htn, hld, cad   Priority: High  Onset Date: 07/27/2020    Long-Range Goal: Disease State Management   Start Date: 07/27/2020  Expected End Date: 07/27/2021  This Visit's Progress: On track  Priority: High  Note:   Current Barriers:  . Unable to independently monitor therapeutic efficacy  Pharmacist Clinical Goal(s):  Marland Kitchen Patient will achieve adherence to monitoring guidelines and medication adherence to achieve therapeutic efficacy through collaboration with PharmD and provider.   Interventions: . 1:1 collaboration with Lillard Anes, MD regarding development and update of comprehensive plan of care as evidenced by provider attestation and co-signature . Inter-disciplinary care team collaboration (see longitudinal plan of care) . Comprehensive medication review  performed; medication list updated in electronic medical record  Hypertension (BP goal <130/80) -Controlled -Current treatment: . amlodipine 5 mg daily . Metoprolol succinate 25 mg 3 tablets daily  . Ramipril 10 gm daily  -Medications previously tried: none reported  -Current home readings: well controlled per patient. Daughter/granddaughter check and state it is at goal.   -Current dietary habits:  drinking water. States has  a good appetite.  -Current exercise habits: walks about house -Denies hypotensive/hypertensive symptoms -Educated on BP goals and benefits of medications for prevention of heart attack, stroke and kidney damage; Daily salt intake goal < 2300 mg; Importance of home blood pressure monitoring; Symptoms of hypotension and importance of maintaining adequate hydration; -Counseled to monitor BP at home weekly, document, and provide log at future appointments -Counseled on diet and exercise extensively Recommended to continue current medication. Discussed contacting office if blood pressure is low or symptoms of dizziness.   Hyperlipidemia: (LDL goal < 55) -Not ideally controlled -Current treatment: . diet/lifestyle  -Medications previously tried: statin  -Current dietary patterns: states she has a good appetite -Current exercise habits: active around house -Educated on Cholesterol goals;  Importance of limiting foods high in cholesterol; -Counseled on diet and exercise extensively Recommended heart-healthy diet with fruits, vegetables and lean meat.    Patient Goals/Self-Care Activities . Patient will:  - take medications as prescribed focus on medication adherence by using pill box  Follow Up Plan: Telephone follow up appointment with care management team member scheduled for: 06/2021      Medication Assistance: None required.  Patient affirms current coverage meets needs.  Patient's preferred pharmacy is:  [id number] [id number]: [phone]: [phone] Uses pill box? Yes Pt endorses good compliance  We discussed: Current pharmacy is preferred with insurance plan and patient is satisfied with pharmacy services Patient decided to: Continue current medication management strategy  Care Plan and Follow Up Patient Decision:  Patient agrees to Care Plan and Follow-up.  Plan: Telephone follow up appointment with care management team member scheduled for:  06/2021

## 2020-07-27 NOTE — Patient Instructions (Addendum)
Visit Information  Goals Addressed            This Visit's Progress   . Learn More About My Health       Timeframe:  Long-Range Goal Priority:  High Start Date:                             Expected End Date:                        Follow Up Date 06/2021    - tell my story and reason for my visit - repeat what I heard to make sure I understand - bring a list of my medicines to the visit    Why is this important?    The best way to learn about your health and care is by talking to the doctor and nurse.   They will answer your questions and give you information in the way that you like best.    Notes:     Marland Kitchen Manage My Medicine       Timeframe:  Long-Range Goal Priority:  High Start Date:                             Expected End Date:                       Follow Up Date 06/2021    - call for medicine refill 2 or 3 days before it runs out - keep a list of all the medicines I take; vitamins and herbals too - use a pillbox to sort medicine    Why is this important?   . These steps will help you keep on track with your medicines.   Notes:     . Track and Manage My Blood Pressure-Hypertension       Timeframe:  Long-Range Goal Priority:  High Start Date:                             Expected End Date:                       Follow Up Date 06/2021    - check blood pressure weekly - write blood pressure results in a log or diary    Why is this important?    You won't feel high blood pressure, but it can still hurt your blood vessels.   High blood pressure can cause heart or kidney problems. It can also cause a stroke.   Making lifestyle changes like losing a little weight or eating less salt will help.   Checking your blood pressure at home and at different times of the day can help to control blood pressure.   If the doctor prescribes medicine remember to take it the way the doctor ordered.   Call the office if you cannot afford the medicine or if there are  questions about it.     Notes:       Patient Care Plan: CCM Pharmacy Care Plan    Problem Identified: htn, hld, cad   Priority: High  Onset Date: 07/27/2020    Long-Range Goal: Disease State Management   Start Date: 07/27/2020  Expected End Date: 07/27/2021  This Visit's Progress: On track  Priority: High  Note:  Current Barriers:  . Unable to independently monitor therapeutic efficacy  Pharmacist Clinical Goal(s):  Marland Kitchen Patient will achieve adherence to monitoring guidelines and medication adherence to achieve therapeutic efficacy through collaboration with PharmD and provider.   Interventions: . 1:1 collaboration with Lillard Anes, MD regarding development and update of comprehensive plan of care as evidenced by provider attestation and co-signature . Inter-disciplinary care team collaboration (see longitudinal plan of care) . Comprehensive medication review performed; medication list updated in electronic medical record  Hypertension (BP goal <130/80) -Controlled -Current treatment: . amlodipine 5 mg daily . Metoprolol succinate 25 mg 3 tablets daily  . Ramipril 10 gm daily  -Medications previously tried: none reported  -Current home readings: well controlled per patient. Daughter/granddaughter check and state it is at goal.   -Current dietary habits:  drinking water. States has a good appetite.  -Current exercise habits: walks about house -Denies hypotensive/hypertensive symptoms -Educated on BP goals and benefits of medications for prevention of heart attack, stroke and kidney damage; Daily salt intake goal < 2300 mg; Importance of home blood pressure monitoring; Symptoms of hypotension and importance of maintaining adequate hydration; -Counseled to monitor BP at home weekly, document, and provide log at future appointments -Counseled on diet and exercise extensively Recommended to continue current medication. Discussed contacting office if blood pressure is  low or symptoms of dizziness.   Hyperlipidemia: (LDL goal < 55) -Not ideally controlled -Current treatment: . diet/lifestyle  -Medications previously tried: statin  -Current dietary patterns: states she has a good appetite -Current exercise habits: active around house -Educated on Cholesterol goals;  Importance of limiting foods high in cholesterol; -Counseled on diet and exercise extensively Recommended heart-healthy diet with fruits, vegetables and lean meat.    Patient Goals/Self-Care Activities . Patient will:  - take medications as prescribed focus on medication adherence by using pill box  Follow Up Plan: Telephone follow up appointment with care management team member scheduled for: 06/2021      The patient verbalized understanding of instructions, educational materials, and care plan provided today and declined offer to receive copy of patient instructions, educational materials, and care plan.  Telephone follow up appointment with pharmacy team member scheduled for: 06/2021  Burnice Logan, [facility]  [email or web address]">  DASH Eating Plan DASH stands for Dietary Approaches to Stop Hypertension. The DASH eating plan is a healthy eating plan that has been shown to:  Reduce high blood pressure (hypertension).  Reduce your risk for type 2 diabetes, heart disease, and stroke.  Help with weight loss. What are tips for following this plan? Reading food labels  Check food labels for the amount of salt (sodium) per serving. Choose foods with less than 5 percent of the Daily Value of sodium. Generally, foods with less than 300 milligrams (mg) of sodium per serving fit into this eating plan.  To find whole grains, look for the word "whole" as the first word in the ingredient list. Shopping  Buy products labeled as "low-sodium" or "no salt added."  Buy fresh foods. Avoid canned foods and pre-made or frozen meals. Cooking  Avoid  adding salt when cooking. Use salt-free seasonings or herbs instead of table salt or sea salt. Check with your health care provider or pharmacist before using salt substitutes.  Do not fry foods. Cook foods using healthy methods such as baking, boiling, grilling, roasting, and broiling instead.  Cook with heart-healthy oils, such as olive, canola, avocado, soybean, or sunflower oil. Meal planning  Eat a balanced diet  that includes: ? 4 or more servings of fruits and 4 or more servings of vegetables each day. Try to fill one-half of your plate with fruits and vegetables. ? 6-8 servings of whole grains each day. ? Less than 6 oz (170 g) of lean meat, poultry, or fish each day. A 3-oz (85-g) serving of meat is about the same size as a deck of cards. One egg equals 1 oz (28 g). ? 2-3 servings of low-fat dairy each day. One serving is 1 cup (237 mL). ? 1 serving of nuts, seeds, or beans 5 times each week. ? 2-3 servings of heart-healthy fats. Healthy fats called omega-3 fatty acids are found in foods such as walnuts, flaxseeds, fortified milks, and eggs. These fats are also found in cold-water fish, such as sardines, salmon, and mackerel.  Limit how much you eat of: ? Canned or prepackaged foods. ? Food that is high in trans fat, such as some fried foods. ? Food that is high in saturated fat, such as fatty meat. ? Desserts and other sweets, sugary drinks, and other foods with added sugar. ? Full-fat dairy products.  Do not salt foods before eating.  Do not eat more than 4 egg yolks a week.  Try to eat at least 2 vegetarian meals a week.  Eat more home-cooked food and less restaurant, buffet, and fast food.   Lifestyle  When eating at a restaurant, ask that your food be prepared with less salt or no salt, if possible.  If you drink alcohol: ? Limit how much you use to:  0-1 drink a day for women who are not pregnant.  0-2 drinks a day for men. ? Be aware of how much alcohol is in  your drink. In the [place] one drink equals one 12 oz bottle of beer (355 mL), one 5 oz glass of wine (148 mL), or one 1 oz glass of hard liquor (44 mL). General information  Avoid eating more than 2,300 mg of salt a day. If you have hypertension, you may need to reduce your sodium intake to 1,500 mg a day.  Work with your health care provider to maintain a healthy body weight or to lose weight. Ask what an ideal weight is for you.  Get at least 30 minutes of exercise that causes your heart to beat faster (aerobic exercise) most days of the week. Activities may include walking, swimming, or biking.  Work with your health care provider or dietitian to adjust your eating plan to your individual calorie needs. What foods should I eat? Fruits All fresh, dried, or frozen fruit. Canned fruit in natural juice (without added sugar). Vegetables Fresh or frozen vegetables (raw, steamed, roasted, or grilled). Low-sodium or reduced-sodium tomato and vegetable juice. Low-sodium or reduced-sodium tomato sauce and tomato paste. Low-sodium or reduced-sodium canned vegetables. Grains Whole-grain or whole-wheat bread. Whole-grain or whole-wheat pasta. Abbe Bula rice. [place] Morrow. Bulgur. Whole-grain and low-sodium cereals. Pita bread. Low-fat, low-sodium crackers. Whole-wheat flour tortillas. Meats and other proteins Skinless chicken or [place]. Ground chicken or [place]. Pork with fat trimmed off. Fish and seafood. Egg whites. Dried beans, peas, or lentils. Unsalted nuts, nut butters, and seeds. Unsalted canned beans. Lean cuts of beef with fat trimmed off. Low-sodium, lean precooked or cured meat, such as sausages or meat loaves. Dairy Low-fat (1%) or fat-free (skim) milk. Reduced-fat, low-fat, or fat-free cheeses. Nonfat, low-sodium ricotta or cottage cheese. Low-fat or nonfat yogurt. Low-fat, low-sodium cheese. Fats and oils Soft margarine without trans fats.  Vegetable oil. Reduced-fat, low-fat, or light  mayonnaise and salad dressings (reduced-sodium). Canola, safflower, olive, avocado, soybean, and sunflower oils. Avocado. Seasonings and condiments Herbs. Spices. Seasoning mixes without salt. Other foods Unsalted popcorn and pretzels. Fat-free sweets. The items listed above may not be a complete list of foods and beverages you can eat. Contact a dietitian for more information. What foods should I avoid? Fruits Canned fruit in a light or heavy syrup. Fried fruit. Fruit in cream or butter sauce. Vegetables Creamed or fried vegetables. Vegetables in a cheese sauce. Regular canned vegetables (not low-sodium or reduced-sodium). Regular canned tomato sauce and paste (not low-sodium or reduced-sodium). Regular tomato and vegetable juice (not low-sodium or reduced-sodium). Angie Fava. Olives. Grains Baked goods made with fat, such as croissants, muffins, or some breads. Dry pasta or rice meal packs. Meats and other proteins Fatty cuts of meat. Ribs. Fried meat. Berniece Salines. Bologna, salami, and other precooked or cured meats, such as sausages or meat loaves. Fat from the back of a pig (fatback). Bratwurst. Salted nuts and seeds. Canned beans with added salt. Canned or smoked fish. Whole eggs or egg yolks. Chicken or [place] with skin. Dairy Whole or 2% milk, cream, and half-and-half. Whole or full-fat cream cheese. Whole-fat or sweetened yogurt. Full-fat cheese. Nondairy creamers. Whipped toppings. Processed cheese and cheese spreads. Fats and oils Butter. Stick margarine. Lard. Shortening. Ghee. Bacon fat. Tropical oils, such as coconut, palm kernel, or palm oil. Seasonings and condiments Onion salt, garlic salt, seasoned salt, table salt, and sea salt. Worcestershire sauce. Tartar sauce. Barbecue sauce. Teriyaki sauce. Soy sauce, including reduced-sodium. Steak sauce. Canned and packaged gravies. Fish sauce. Oyster sauce. Cocktail sauce. Store-bought horseradish. Ketchup. Mustard. Meat flavorings and  tenderizers. Bouillon cubes. Hot sauces. Pre-made or packaged marinades. Pre-made or packaged taco seasonings. Relishes. Regular salad dressings. Other foods Salted popcorn and pretzels. The items listed above may not be a complete list of foods and beverages you should avoid. Contact a dietitian for more information. Where to find more information  National Heart, Lung, and Blood Institute: https://wilson-eaton.com/  American Heart Association: www.heart.org  Academy of Nutrition and Dietetics: www.eatright.[place]: www.kidney.org Summary  The DASH eating plan is a healthy eating plan that has been shown to reduce high blood pressure (hypertension). It may also reduce your risk for type 2 diabetes, heart disease, and stroke.  When on the DASH eating plan, aim to eat more fresh fruits and vegetables, whole grains, lean proteins, low-fat dairy, and heart-healthy fats.  With the DASH eating plan, you should limit salt (sodium) intake to 2,300 mg a day. If you have hypertension, you may need to reduce your sodium intake to 1,500 mg a day.  Work with your health care provider or dietitian to adjust your eating plan to your individual calorie needs. This information is not intended to replace advice given to you by your health care provider. Make sure you discuss any questions you have with your health care provider. Document Revised: 03/05/2019 Document Reviewed: 03/05/2019 Elsevier Patient Education  2021 [employer].

## 2020-09-29 DIAGNOSIS — H401123 Primary open-angle glaucoma, left eye, severe stage: Secondary | ICD-10-CM | POA: Diagnosis not present

## 2020-10-13 ENCOUNTER — Other Ambulatory Visit: Payer: Self-pay | Admitting: Family Medicine

## 2020-10-19 ENCOUNTER — Ambulatory Visit: Payer: Medicare Other | Admitting: Podiatry

## 2020-10-24 ENCOUNTER — Other Ambulatory Visit: Payer: Medicare Other

## 2020-10-24 DIAGNOSIS — E875 Hyperkalemia: Secondary | ICD-10-CM | POA: Diagnosis not present

## 2020-10-25 ENCOUNTER — Encounter: Payer: Self-pay | Admitting: Legal Medicine

## 2020-10-25 DIAGNOSIS — E211 Secondary hyperparathyroidism, not elsewhere classified: Secondary | ICD-10-CM | POA: Insufficient documentation

## 2020-10-25 LAB — PARATHYROID HORMONE, INTACT (NO CA): PTH: 142 pg/mL — ABNORMAL HIGH (ref 15–65)

## 2020-10-25 LAB — POTASSIUM: Potassium: 5.8 mmol/L — ABNORMAL HIGH (ref 3.5–5.2)

## 2020-10-25 NOTE — Progress Notes (Signed)
PTH level 142 high, she has hyperparathyroidism, potassium 5.8 high- do not take any potassium supplements, hold avapro for 2 weeks, recheck potassium in 2 weeks lp

## 2020-11-02 ENCOUNTER — Ambulatory Visit: Payer: Medicare Other | Admitting: Podiatry

## 2020-12-05 ENCOUNTER — Ambulatory Visit (INDEPENDENT_AMBULATORY_CARE_PROVIDER_SITE_OTHER): Payer: Medicare Other | Admitting: Legal Medicine

## 2020-12-05 ENCOUNTER — Other Ambulatory Visit: Payer: Self-pay

## 2020-12-05 ENCOUNTER — Encounter: Payer: Self-pay | Admitting: Legal Medicine

## 2020-12-05 VITALS — BP 110/80 | HR 60 | Temp 97.7°F | Ht 64.0 in | Wt 99.6 lb

## 2020-12-05 DIAGNOSIS — E782 Mixed hyperlipidemia: Secondary | ICD-10-CM

## 2020-12-05 DIAGNOSIS — E440 Moderate protein-calorie malnutrition: Secondary | ICD-10-CM | POA: Diagnosis not present

## 2020-12-05 DIAGNOSIS — Z681 Body mass index (BMI) 19 or less, adult: Secondary | ICD-10-CM

## 2020-12-05 DIAGNOSIS — I10 Essential (primary) hypertension: Secondary | ICD-10-CM

## 2020-12-05 DIAGNOSIS — I739 Peripheral vascular disease, unspecified: Secondary | ICD-10-CM

## 2020-12-05 DIAGNOSIS — I200 Unstable angina: Secondary | ICD-10-CM | POA: Diagnosis not present

## 2020-12-05 MED ORDER — NITROGLYCERIN 0.4 MG SL SUBL: 0.4000 mg | SUBLINGUAL_TABLET | SUBLINGUAL | 2 refills | Status: DC | PRN

## 2020-12-05 NOTE — Progress Notes (Signed)
Established Patient Office Visit  Subjective:  Patient ID: [name], female    DOB: 03/24/1926  Age: 87 y.o. MRN: [id number]  CC:  Chief Complaint  Patient presents with   Medication Problem    HPI [name] presents for Chronic visit  Patient presents for follow up of hypertension.  Patient tolerating amlodipine, metoprolol, atace well with side effects.  Patient was diagnosed with hypertension 2010 so has been treated for hypertension for 10 years.Patient is working on maintaining diet and exercise regimen and follows up as directed. Complication include cad  . CORONARY ARTERY DISEASE  Patient presents in follow up of CAD. Patient was diagnosed in 2010. The patient has no associated CHF. The patient is currently taking a beta blocker, statin, and aspirin. CAD was diagnosed 10 years ago.  Patient is having no angina. Patient has used no NTG.  Patient is followed by cardiology.  Patient had none . Last angiography was none, last echocardiogram none  Patient presents with hyperlipidemia.  Compliance with treatment has been good; patient takes medicines as directed, maintains low cholesterol diet, follows up as directed, and maintains exercise regimen.  Patient is using none without problems. .     Past Medical History:  Diagnosis Date   Abscess 2003   I&D SCM abscess   Acute ischemic colitis ([place]) 04/10/11   acute episode of ischemic colitis, with abd pain, CT evidence of transverse colitis, and blood streaked BM.  Resolved with conservative treatment.   Arthritis    "hands, joints, knees" (07/19/2016)   Bacteremia 2003   hx of staph bacteremia   CAD (coronary artery disease) 2003, 2010   s/p stenting of RCA x2, s/p stent to OM;  LHC 04/2009 in the setting of a NSTEMI: EF 65%, RCA stent patent with 60% ISR proximal and 40% ISR mid, PLV proximal 50-60% and distal 50%, ostial circumflex 30-40%, proximal OM mild in-stent restenosis with a branch with 90+ percent ostial  stenosis, proximal LAD 30-40%.  FFR of ostial circumflex:  0.96 - not flow limiting.  Medical therapy was continued.   Chronic lower back pain    "not a big problem" (07/19/2016)   Closed displaced fracture of distal phalanx of left middle finger 05/23/2017   Degenerative disc disease    GERD (gastroesophageal reflux disease)    History of medication noncompliance    concerning Plavix   HTN (hypertension)    Echocardiogram 08/26/11: Mild focal basal septal hypertrophy, EF 82-42%, grade 2 diastolic dysfunction, mild MR.  There was a fluid-filled area likely in the location of the liver noted   Hyperlipidemia    Hyperparathyroidism 2003   parathyroid adenoma localized to the right inferior thyroid lobe region   Hypothyroidism    Left renal artery stenosis (HCC)    40%   Lumbar stenosis with neurogenic claudication 2006   s/p decompression L2-L5   Myocardial infarction ([place]) ~ 2013; 05/06/2016   Osteoporosis 2009   diagnosed by bone density scan in 2009, on Vit D only, no bisphosphonate therapy   Phlebitis    LLE   PVD (peripheral vascular disease) (HCC)    s/p left fem-pop bypass graft   TIA (transient ischemic attack)    carotid dopplers 5/13: no ICA stenosis    Past Surgical History:  Procedure Laterality Date   AMPUTATION FINGER Left 05/24/2017   Procedure: AMPUTATION tip of long FINGER;  Surgeon: Cindy Barker, MD;  Location: [facility];  Service: Plastics;  Laterality: Left;  CARDIAC CATHETERIZATION N/A 05/06/2016   Procedure: Left Heart Cath and Coronary Angiography;  Surgeon: Cindy Harp, MD;  Location: [facility];  Service: Cardiovascular;  Laterality: N/A;   CARDIAC CATHETERIZATION N/A 05/06/2016   Procedure: Coronary Stent Intervention;  Surgeon: Cindy Harp, MD;  Location: [facility];  Service: Cardiovascular;  Laterality: N/A;  RCA   CATARACT EXTRACTION, BILATERAL Bilateral    CORONARY ANGIOPLASTY WITH STENT PLACEMENT  09/2008   CORONARY ANGIOPLASTY WITH  STENT PLACEMENT  04/2001   ENDOSCOPIC HEMILAMINOTOMY W/ DISCECTOMY LUMBAR  06/2004   L3, L4 lami; L2, L5 hemilami; decompresion L2-3, 3-4, 4-   FEMORAL BYPASS  before 2003   LLE   I & D EXTREMITY Left 05/24/2017   Procedure: IRRIGATION AND DEBRIDEMENT LEFT LONG FINGER;  Surgeon: Cindy Barker, MD;  Location: [facility];  Service: Plastics;  Laterality: Left;   INCISION AND DRAINAGE ANTERIOR NECK  10/2001   absces sternoclavicular joint    Family History  Problem Relation Age of Onset   Stroke Mother    Stroke Father     Social History   Socioeconomic History   Marital status: Widowed    Spouse name: Not on file   Number of children: Not on file   Years of education: Not on file   Highest education level: Not on file  Occupational History   Not on file  Tobacco Use   Smoking status: Never   Smokeless tobacco: Never  Substance and Sexual Activity   Alcohol use: No   Drug use: No   Sexual activity: Never  Other Topics Concern   Not on file  Social History Narrative   Lives with daughter and granddaughter    PCP: Dr. Reinaldo [name] in [place] Determinants of Health   Financial Resource Strain: Not on file  Food Insecurity: No Food Insecurity   Worried About Charity fundraiser in the Last Year: Never true   Arboriculturist in the Last Year: Never true  Transportation Needs: Not on file  Physical Activity: Not on file  Stress: Not on file  Social Connections: Not on file  Intimate Partner Violence: Not on file    Outpatient Medications Prior to Visit  Medication Sig Dispense Refill   amLODipine (NORVASC) 5 MG tablet Take 1 tablet (5 mg total) by mouth daily. 90 tablet 3   Ascorbic Acid (VITAMIN C) 100 MG tablet Take by mouth daily.     b complex vitamins tablet Take 1 tablet by mouth daily.     cholecalciferol (VITAMIN D3) 25 MCG (1000 UNIT) tablet Take by mouth daily.     Coenzyme Q10 (COQ10) 50 MG CAPS Take 50 mg by mouth daily.      dorzolamide-timolol (COSOPT) 22.3-6.8 MG/ML ophthalmic solution 1 drop at bedtime.     ELDERBERRY PO Take by mouth daily.     MAGNESIUM GLUCONATE PO Take by mouth daily.     metoprolol succinate (TOPROL-XL) 25 MG 24 hr tablet Take 3 tablets (75 mg total) by mouth daily. 270 tablet 3   Multiple Vitamins-Minerals (ADULT ONE DAILY GUMMIES) CHEW Chew 2 tablets by mouth daily.     ramipril (ALTACE) 10 MG capsule TAKE ONE CAPSULE BY MOUTH EVERY DAY 90 capsule 2   ticagrelor (BRILINTA) 90 MG TABS tablet TAKE 1 TABLET BY MOUTH TWICE A DAY 180 tablet 3   traMADol (ULTRAM) 50 MG tablet TAKE 2 TABLETS BY  MOUTH EVERY DAY 60 tablet 0   aspirin 81 MG tablet Take 1 tablet (81 mg total) by mouth daily.     nitroGLYCERIN (NITROSTAT) 0.4 MG SL tablet Place 1 tablet (0.4 mg total) under the tongue every 5 (five) minutes as needed. For chest pain 25 tablet 2   No facility-administered medications prior to visit.    Allergies  Allergen Reactions   Diphenhydramine Hcl Other (See Comments)    unknown   Penicillins Other (See Comments)    unknown   Shellfish Allergy Other (See Comments)    unknown    ROS Review of Systems  Constitutional:  Negative for activity change and appetite change.  HENT:  Negative for congestion.   Eyes:  Negative for visual disturbance.  Respiratory:  Negative for chest tightness and shortness of breath.   Cardiovascular:  Negative for chest pain, palpitations and leg swelling.  Gastrointestinal:  Negative for abdominal distention and abdominal pain.  Endocrine: Negative for polyuria.  Genitourinary:  Negative for difficulty urinating and dysuria.  Musculoskeletal:  Negative for arthralgias and back pain.  Skin: Negative.   Neurological: Negative.   Psychiatric/Behavioral: Negative.       Objective:    Physical Exam Vitals reviewed.  Constitutional:      Appearance: Normal appearance.  HENT:     Right Ear: Tympanic membrane normal.     Left Ear: Tympanic membrane  normal.     Nose: Nose normal.     Mouth/Throat:     Mouth: Mucous membranes are moist.     Pharynx: Oropharynx is clear.  Eyes:     Extraocular Movements: Extraocular movements intact.     Conjunctiva/sclera: Conjunctivae normal.     Pupils: Pupils are equal, round, and reactive to light.  Cardiovascular:     Rate and Rhythm: Normal rate and regular rhythm.     Pulses: Normal pulses.     Heart sounds: No murmur heard.   No gallop.  Pulmonary:     Effort: Pulmonary effort is normal. No respiratory distress.     Breath sounds: Normal breath sounds. No wheezing.  Abdominal:     General: Abdomen is flat. Bowel sounds are normal. There is no distension.     Palpations: Abdomen is soft.     Tenderness: There is no abdominal tenderness.  Musculoskeletal:        General: Normal range of motion.     Cervical back: Normal range of motion and neck supple.     Right lower leg: No edema.     Left lower leg: No edema.  Skin:    General: Skin is warm.     Capillary Refill: Capillary refill takes less than 2 seconds.  Neurological:     General: No focal deficit present.     Mental Status: She is alert and oriented to person, place, and time. Mental status is at baseline.  Psychiatric:        Mood and Affect: Mood normal.        Behavior: Behavior normal.        Thought Content: Thought content normal.        Judgment: Judgment normal.    BP 110/80   Pulse 60   Temp 97.7 F (36.5 C)   Ht 5\' 4"  (1.626 m)   Wt 99 lb 9.6 oz (45.2 kg)   BMI 17.10 kg/m  Wt Readings from Last 3 Encounters:  12/05/20 99 lb 9.6 oz (45.2 kg)  05/04/20 93 lb (42.2 kg)  02/18/20 100 lb (45.4 kg)     Health Maintenance Due  Topic Date Due   Zoster Vaccines- Shingrix (1 of 2) Never done   PNA vac Low Risk Adult (1 of 2 - PCV13) Never done   COVID-19 Vaccine (3 - Booster for Pfizer series) 05/05/2020   INFLUENZA VACCINE  11/13/2020    There are no preventive care reminders to display for this  patient.  Lab Results  Component Value Date   TSH 0.495 05/04/2020   Lab Results  Component Value Date   WBC 7.9 05/04/2020   HGB 11.8 05/04/2020   HCT 36.0 05/04/2020   MCV 95 05/04/2020   PLT 198 05/04/2020   Lab Results  Component Value Date   NA 143 05/04/2020   K 5.8 (H) 10/24/2020   CO2 22 05/04/2020   GLUCOSE 98 05/04/2020   BUN 27 05/04/2020   CREATININE 1.28 (H) 05/04/2020   BILITOT <0.2 05/04/2020   ALKPHOS 150 (H) 05/04/2020   AST 21 05/04/2020   ALT 11 05/04/2020   PROT 7.3 05/04/2020   ALBUMIN 4.1 05/04/2020   CALCIUM 12.5 (H) 05/04/2020   ANIONGAP 8 03/31/2018   Lab Results  Component Value Date   CHOL 285 (H) 05/04/2020   Lab Results  Component Value Date   HDL 62 05/04/2020   Lab Results  Component Value Date   LDLCALC 185 (H) 05/04/2020   Lab Results  Component Value Date   TRIG 204 (H) 05/04/2020   Lab Results  Component Value Date   CHOLHDL 4.6 (H) 05/04/2020   Lab Results  Component Value Date   HGBA1C 5.9 (H) 08/26/2011      Assessment & Plan:   Problem List Items Addressed This Visit       Cardiovascular and Mediastinum   Hypertension - Primary (Chronic)   Relevant Medications   nitroGLYCERIN (NITROSTAT) 0.4 MG SL tablet   Other Relevant Orders   CBC with Differential/Platelet   Comprehensive metabolic panel An individual hypertension care plan was established and reinforced today.  The patient's status was assessed using clinical findings on exam and labs or diagnostic tests. The patient's success at meeting treatment goals on disease specific evidence-based guidelines and found to be well controlled. SELF MANAGEMENT: The patient and I together assessed ways to personally work towards obtaining the recommended goals. RECOMMENDATIONS: avoid decongestants found in common cold remedies, decrease consumption of alcohol, perform routine monitoring of BP with home BP cuff, exercise, reduction of dietary salt, take medicines as  prescribed, try not to miss doses and quit smoking.  Regular exercise and maintaining a healthy weight is needed.  Stress reduction may help. A CLINICAL SUMMARY including written plan identify barriers to care unique to individual due to social or financial issues.  We attempt to mutually creat solutions for individual and family understanding.     Peripheral vascular disease (HCC) (Chronic)   Relevant Medications   nitroGLYCERIN (NITROSTAT) 0.4 MG SL tablet Patient is not having andy claudication    Unstable angina (HCC)   Relevant Medications   nitroGLYCERIN (NITROSTAT) 0.4 MG SL tablet An individual plan was formulated based on patient history and exam, labs and evidence based data. Patient is not had recent angina or nitroglycerin use. continue present treatment.      Other   Hyperlipidemia (Chronic)   Relevant Medications   nitroGLYCERIN (NITROSTAT) 0.4 MG SL tablet   Other Relevant Orders   Lipid panel AN INDIVIDUAL CARE PLAN for hyperlipidemia/ cholesterol was established and  reinforced today.  The patient's status was assessed using clinical findings on exam, lab and other diagnostic tests. The patient's disease status was assessed based on evidence-based guidelines and found to be fair controlled. MEDICATIONS were reviewed. SELF MANAGEMENT GOALS have been discussed and patient's success at attaining the goal of low cholesterol was assessed. RECOMMENDATION given include regular exercise 3 days a week and low cholesterol/low fat diet. CLINICAL SUMMARY including written plan to identify barriers unique to the patient due to social or economic  reasons was discussed.     Adult BMI <19 kg/sq m Patient is gaining some weight back    Malnutrition of moderate degree ([place]) Supplement nutrition with protein/calorie supplement with meals to improve nutritional status.     Meds ordered this encounter  Medications   nitroGLYCERIN (NITROSTAT) 0.4 MG SL tablet    Sig: Place 1 tablet (0.4  mg total) under the tongue every 5 (five) minutes as needed. For chest pain    Dispense:  25 tablet    Refill:  2  30 minute visit with review of old records  Follow-up: Return in about 6 months (around 06/07/2021) for fasting.    Cindy Meeker, MD

## 2020-12-06 ENCOUNTER — Encounter: Payer: Self-pay | Admitting: Cardiology

## 2021-02-13 ENCOUNTER — Other Ambulatory Visit: Payer: Self-pay | Admitting: Family Medicine

## 2021-02-13 ENCOUNTER — Other Ambulatory Visit: Payer: Self-pay | Admitting: Cardiology

## 2021-02-13 NOTE — Telephone Encounter (Signed)
Your patient. KC °

## 2021-02-15 ENCOUNTER — Ambulatory Visit: Payer: Medicare Other | Admitting: Cardiology

## 2021-02-26 ENCOUNTER — Telehealth: Payer: Self-pay

## 2021-02-26 NOTE — Telephone Encounter (Signed)
Felecia Martin calling for pt. States pt's BP has been 143/86. In August per Felecia pt was told to stop amlodipine. Denies headache, dizziness. Does not know what pt should do. Amlodipine still on current med list.    Felecia: [phone]  Harrell Lark 02/26/21 3:35 PM

## 2021-02-27 MED ORDER — AMLODIPINE BESYLATE 5 MG PO TABS: 5.0000 mg | ORAL_TABLET | Freq: Every day | ORAL | 1 refills | Status: DC

## 2021-02-27 NOTE — Telephone Encounter (Signed)
Returned call to [employer]. She VU and requested script be sent to [facility] in [place]. Amlodipine 5 mg once daily sent.  Harrell Lark 02/27/21 8:39 AM

## 2021-03-13 ENCOUNTER — Ambulatory Visit: Payer: Medicare Other | Admitting: Cardiology

## 2021-03-15 NOTE — Progress Notes (Signed)
Cardiology Office Note   Date:  03/16/2021   ID:  [name], DOB [birth year], MRN [id number]  PCP:  Lillard Anes, MD  Cardiologist:   Minus Breeding, MD   Chief Complaint  Patient presents with   Coronary Artery Disease       History of Present Illness: [name] is a 87 y.o. female who presents  for followup of known coronary disease. She was in the hospital in Jan 2018 with NSTEMI. She had an acute inferior lateral STEMI.   This was felt to be secondary to RCA occlusion. She had stenting of distal RCA into PDA with DES. The remainder of RCA was treated with POBA. She does have residual disease in mid LAD up to 90%. This is a heavily calcified vessel. Per Dr. Martinique  the left main disease is significant.  The LAD disease is a complex lesion and would require atherectomy. Given her age the decision was to manage medically but if she has recurrent angina it was thought that it would be reasonable to consider PCI of LAD.  She was started on a statin. She came back to the hospital with chest pain but this time was found to have elevated liver enzymes.  She was taken off statin.     Since I last saw her she has done well.  She gets around with a wheelchair if she is going long distances but she can ambulate in the house and do the dishes.  She can go out to do some shopping. The patient denies any new symptoms such as chest discomfort, neck or arm discomfort. There has been no new shortness of breath, PND or orthopnea. There have been no reported palpitations, presyncope or syncope.     Past Medical History:  Diagnosis Date   Abscess 2003   I&D SCM abscess   Acute ischemic colitis ([place]) 04/10/11   acute episode of ischemic colitis, with abd pain, CT evidence of transverse colitis, and blood streaked BM.  Resolved with conservative treatment.   Arthritis    "hands, joints, knees" (07/19/2016)   Bacteremia 2003   hx of staph bacteremia   CAD (coronary artery disease)  2003, 2010   s/p stenting of RCA x2, s/p stent to OM;  LHC 04/2009 in the setting of a NSTEMI: EF 65%, RCA stent patent with 60% ISR proximal and 40% ISR mid, PLV proximal 50-60% and distal 50%, ostial circumflex 30-40%, proximal OM mild in-stent restenosis with a branch with 90+ percent ostial stenosis, proximal LAD 30-40%.  FFR of ostial circumflex:  0.96 - not flow limiting.  Medical therapy was continued.   Chronic lower back pain    "not a big problem" (07/19/2016)   Closed displaced fracture of distal phalanx of left middle finger 05/23/2017   Degenerative disc disease    GERD (gastroesophageal reflux disease)    History of medication noncompliance    concerning Plavix   HTN (hypertension)    Echocardiogram 08/26/11: Mild focal basal septal hypertrophy, EF 35-32%, grade 2 diastolic dysfunction, mild MR.  There was a fluid-filled area likely in the location of the liver noted   Hyperlipidemia    Hyperparathyroidism 2003   parathyroid adenoma localized to the right inferior thyroid lobe region   Hypothyroidism    Left renal artery stenosis (HCC)    40%   Lumbar stenosis with neurogenic claudication 2006   s/p decompression L2-L5   Myocardial infarction [facility]) ~ 2013; 05/06/2016   Osteoporosis 2009  diagnosed by bone density scan in 2009, on Vit D only, no bisphosphonate therapy   Phlebitis    LLE   PVD (peripheral vascular disease) (HCC)    s/p left fem-pop bypass graft   TIA (transient ischemic attack)    carotid dopplers 5/13: no ICA stenosis    Past Surgical History:  Procedure Laterality Date   AMPUTATION FINGER Left 05/24/2017   Procedure: AMPUTATION tip of long FINGER;  Surgeon: Dayna Barker, MD;  Location: [place];  Service: Plastics;  Laterality: Left;   CARDIAC CATHETERIZATION N/A 05/06/2016   Procedure: Left Heart Cath and Coronary Angiography;  Surgeon: Lorretta Harp, MD;  Location: [place] [facility];  Service: Cardiovascular;  Laterality: N/A;   CARDIAC CATHETERIZATION  N/A 05/06/2016   Procedure: Coronary Stent Intervention;  Surgeon: Lorretta Harp, MD;  Location: [place] [facility];  Service: Cardiovascular;  Laterality: N/A;  RCA   CATARACT EXTRACTION, BILATERAL Bilateral    CORONARY ANGIOPLASTY WITH STENT PLACEMENT  09/2008   CORONARY ANGIOPLASTY WITH STENT PLACEMENT  04/2001   ENDOSCOPIC HEMILAMINOTOMY W/ DISCECTOMY LUMBAR  06/2004   L3, L4 lami; L2, L5 hemilami; decompresion L2-3, 3-4, 4-   FEMORAL BYPASS  before 2003   LLE   I & D EXTREMITY Left 05/24/2017   Procedure: IRRIGATION AND DEBRIDEMENT LEFT LONG FINGER;  Surgeon: Dayna Barker, MD;  Location: [place];  Service: Plastics;  Laterality: Left;   INCISION AND DRAINAGE ANTERIOR NECK  10/2001   absces sternoclavicular joint     Current Outpatient Medications  Medication Sig Dispense Refill   aspirin EC 81 MG tablet Take 1 tablet (81 mg total) by mouth daily. Swallow whole. 90 tablet 3   amLODipine (NORVASC) 5 MG tablet Take 1 tablet (5 mg total) by mouth daily. 90 tablet 1   Ascorbic Acid (VITAMIN C) 100 MG tablet Take by mouth daily.     b complex vitamins tablet Take 1 tablet by mouth daily.     cholecalciferol (VITAMIN D3) 25 MCG (1000 UNIT) tablet Take by mouth daily.     Coenzyme Q10 (COQ10) 50 MG CAPS Take 50 mg by mouth daily.     dorzolamide-timolol (COSOPT) 22.3-6.8 MG/ML ophthalmic solution 1 drop at bedtime.     ELDERBERRY PO Take by mouth daily.     MAGNESIUM GLUCONATE PO Take by mouth daily.     metoprolol succinate (TOPROL-XL) 25 MG 24 hr tablet Take 3 tablets (75 mg total) by mouth daily. 270 tablet 3   Multiple Vitamins-Minerals (ADULT ONE DAILY GUMMIES) CHEW Chew 2 tablets by mouth daily.     nitroGLYCERIN (NITROSTAT) 0.4 MG SL tablet Place 1 tablet (0.4 mg total) under the tongue every 5 (five) minutes as needed. For chest pain 25 tablet 2   ramipril (ALTACE) 10 MG capsule TAKE ONE CAPSULE BY MOUTH EVERY DAY 90 capsule 2   ticagrelor (BRILINTA) 90 MG TABS tablet TAKE 1 TABLET  BY MOUTH TWICE A DAY 180 tablet 0   traMADol (ULTRAM) 50 MG tablet TAKE 2 TABLETS BY MOUTH EVERY DAY 60 tablet 0   No current facility-administered medications for this visit.    Allergies:   Diphenhydramine hcl, Penicillins, and Shellfish allergy    ROS:  Please see the history of present illness.   Otherwise, review of systems are positive for none.   All other systems are reviewed and negative.    PHYSICAL EXAM: VS:  BP 119/74   Pulse (!) 57   Ht 5\' 4"  (1.626  m)   Wt 99 lb 9.6 oz (45.2 kg)   SpO2 98%   BMI 17.10 kg/m  , BMI Body mass index is 17.1 kg/m. GENERAL:  Well appearing NECK:  No jugular venous distention, waveform within normal limits, carotid upstroke brisk and symmetric, no bruits, no thyromegaly LUNGS:  Clear to auscultation bilaterally CHEST:  Unremarkable HEART:  PMI not displaced or sustained,S1 and S2 within normal limits, no S3, no S4, no clicks, no rubs, no murmurs ABD:  Flat, positive bowel sounds normal in frequency in pitch, no bruits, no rebound, no guarding, no midline pulsatile mass, no hepatomegaly, no splenomegaly EXT:  2 plus pulses throughout, no edema, no cyanosis no clubbing   EKG:  EKG is ordered today. The ekg ordered today demonstrates sinus rhythm, rate 57 left axis deviation, no acute ST-T wave advised.   Recent Labs: 05/04/2020: ALT 11; BUN 27; Creatinine, Ser 1.28; Hemoglobin 11.8; Platelets 198; Sodium 143; TSH 0.495 10/24/2020: Potassium 5.8    Lipid Panel    Component Value Date/Time   CHOL 285 (H) 05/04/2020 0000   TRIG 204 (H) 05/04/2020 0000   HDL 62 05/04/2020 0000   CHOLHDL 4.6 (H) 05/04/2020 0000   CHOLHDL 3.7 05/06/2016 1940   VLDL 13 05/06/2016 1940   LDLCALC 185 (H) 05/04/2020 0000      Wt Readings from Last 3 Encounters:  03/16/21 99 lb 9.6 oz (45.2 kg)  12/05/20 99 lb 9.6 oz (45.2 kg)  05/04/20 93 lb (42.2 kg)      Other studies Reviewed: Additional studies/ records that were reviewed today include:  Labs. Review of the above records demonstrates:  Please see elsewhere in the note.     ASSESSMENT AND PLAN:   CAD:  The patient has no new sypmtoms.  We had long discussions before about the benefits and risks of DAPT.  I think given her severe disease we are managing medically and the fact that she has had no problems with dual antiplatelet therapy we will continue this.    HTN:  The blood pressure is at target.  No change in therapy.    DYSLIPIDEMIA:     She is not tolerant of statins.  We talked about a PCSK9 inhibitor but she really would not be interested in this.    Current medicines are reviewed at length with the patient today.  The patient does not have concerns regarding medicines.  The following changes have been made:  no change  Labs/ tests ordered today include: None  Orders Placed This Encounter  Procedures   EKG 12-Lead      Disposition:   FU with me in 12 months.     Signed, Minus Breeding, MD  03/16/2021 4:49 PM    [place]

## 2021-03-16 ENCOUNTER — Other Ambulatory Visit: Payer: Self-pay

## 2021-03-16 ENCOUNTER — Encounter: Payer: Self-pay | Admitting: Cardiology

## 2021-03-16 ENCOUNTER — Ambulatory Visit (INDEPENDENT_AMBULATORY_CARE_PROVIDER_SITE_OTHER): Payer: Medicare Other | Admitting: Cardiology

## 2021-03-16 VITALS — BP 119/74 | HR 57 | Ht 64.0 in | Wt 99.6 lb

## 2021-03-16 DIAGNOSIS — I200 Unstable angina: Secondary | ICD-10-CM

## 2021-03-16 DIAGNOSIS — I2510 Atherosclerotic heart disease of native coronary artery without angina pectoris: Secondary | ICD-10-CM

## 2021-03-16 DIAGNOSIS — E785 Hyperlipidemia, unspecified: Secondary | ICD-10-CM | POA: Diagnosis not present

## 2021-03-16 DIAGNOSIS — I10 Essential (primary) hypertension: Secondary | ICD-10-CM | POA: Diagnosis not present

## 2021-03-16 MED ORDER — ASPIRIN EC 81 MG PO TBEC: 81.0000 mg | DELAYED_RELEASE_TABLET | Freq: Every day | ORAL | 3 refills | Status: AC

## 2021-03-16 NOTE — Patient Instructions (Addendum)
Medication Instructions:  °Your Physician recommend you continue on your current medication as directed.   ° °*If you need a refill on your cardiac medications before your next appointment, please call your pharmacy* ° °Follow-Up: °At [facility], you and your health needs are our priority.  As part of our continuing mission to provide you with exceptional heart care, we have created designated Provider Care Teams.  These Care Teams include your primary Cardiologist (physician) and Advanced Practice Providers (APPs -  Physician Assistants and Nurse Practitioners) who all work together to provide you with the care you need, when you need it. ° °We recommend signing up for the patient portal called "MyChart".  Sign up information is provided on this After Visit Summary.  MyChart is used to connect with patients for Virtual Visits (Telemedicine).  Patients are able to view lab/test results, encounter notes, upcoming appointments, etc.  Non-urgent messages can be sent to your provider as well.   °To learn more about what you can do with MyChart, go to [email or web address].   ° °Your next appointment:   °12 month(s) ° °The format for your next appointment:   °In Person ° °Provider:   °James Hochrein, MD  ° ° ° °

## 2021-04-05 ENCOUNTER — Other Ambulatory Visit: Payer: Self-pay | Admitting: Cardiology

## 2021-04-11 DIAGNOSIS — Z20822 Contact with and (suspected) exposure to covid-19: Secondary | ICD-10-CM | POA: Diagnosis not present

## 2021-04-25 ENCOUNTER — Other Ambulatory Visit: Payer: Self-pay | Admitting: Cardiology

## 2021-06-30 ENCOUNTER — Other Ambulatory Visit: Payer: Self-pay | Admitting: Legal Medicine

## 2021-07-02 ENCOUNTER — Telehealth: Payer: Self-pay

## 2021-07-02 NOTE — Progress Notes (Signed)
Called to remind pt of upcoming appt with CPP but no answer and VM was too full. ? ?Elray Mcgregor, CMA ?Clinical Pharmacist Assistant  ?[phone]  ?

## 2021-07-04 ENCOUNTER — Other Ambulatory Visit: Payer: Self-pay

## 2021-07-04 ENCOUNTER — Ambulatory Visit (INDEPENDENT_AMBULATORY_CARE_PROVIDER_SITE_OTHER): Payer: Medicare Other

## 2021-07-04 DIAGNOSIS — I739 Peripheral vascular disease, unspecified: Secondary | ICD-10-CM

## 2021-07-04 DIAGNOSIS — I10 Essential (primary) hypertension: Secondary | ICD-10-CM

## 2021-07-04 NOTE — Progress Notes (Signed)
? ?Chronic Care Management ?Pharmacy Note ? ?07/04/2021 ?Name:  [name] MRN:  [id number] DOB:  27-Jul-1925 ? ? ?Recommendations: ?-Recommend changing Metoprolol Succinate 66m take 3QD to Metoprolol Succ 212mQD + Metoprolol Succ 5064mD ? ? ?Subjective: ?[name] an [age]. year old female who is a primary patient of PerHenrene PastorawZeb [name].  The CCM team was consulted for assistance with disease management and care coordination needs.   ? ?Engaged with patient by telephone for follow up visit in response to provider referral for pharmacy case management and/or care coordination services.  ? ?Consent to Services:  ?The patient was given the following information about Chronic Care Management services today, agreed to services, and gave verbal consent: 1. CCM service includes personalized support from designated clinical staff supervised by the primary care provider, including individualized plan of care and coordination with other care providers 2. 24/7 contact phone numbers for assistance for urgent and routine care needs. 3. Service will only be billed when office clinical staff spend 20 minutes or more in a month to coordinate care. 4. Only one practitioner may furnish and bill the service in a calendar month. 5.The patient may stop CCM services at any time (effective at the end of the month) by phone call to the office staff. 6. The patient will be responsible for cost sharing (co-pay) of up to 20% of the service fee (after annual deductible is met). Patient agreed to services and consent obtained. ? ?Patient Care Team: ?PerLillard AnesD as PCP - General (Family Medicine) ?HocMinus BreedingD as PCP - Cardiology (Cardiology) ?[facility]) ? ?Recent office visits: ?05/04/2020 - No anemia, kidney tests stable, potassium 5.3 recheck in one week, calcium high get PTH level when draw potassium, liver tests OK, Cholesterol high but due to age- no changes, thyroid  tests normal,  ?Recent consult visits: ?07/20/2020 - podiatry - nail/foot care.  ?Hospital visits: ?None in previous 6 months ? ?Objective: ? ?Lab Results  ?Component Value Date  ? CREATININE 1.28 (H) 05/04/2020  ? BUN 27 05/04/2020  ? GFRNONAA 36 (L) 05/04/2020  ? GFRAA 42 (L) 05/04/2020  ? NA 143 05/04/2020  ? K 5.8 (H) 10/24/2020  ? CALCIUM 12.5 (H) 05/04/2020  ? CO2 22 05/04/2020  ? GLUCOSE 98 05/04/2020  ? ? ?Lab Results  ?Component Value Date/Time  ? HGBA1C 5.9 (H) 08/26/2011 06:25 AM  ?  ?Last diabetic Eye exam: No results found for: HMDIABEYEEXA  ?Last diabetic Foot exam: No results found for: HMDIABFOOTEX  ? ?Lab Results  ?Component Value Date  ? CHOL 285 (H) 05/04/2020  ? HDL 62 05/04/2020  ? LDL[place]5 (H) 05/04/2020  ? TRIG 204 (H) 05/04/2020  ? CHOLHDL 4.6 (H) 05/04/2020  ? ? ? ?  Latest Ref Rng & Units 05/04/2020  ? 12:00 AM 10/06/2019  ?  3:20 PM 03/31/2018  ?  6:55 AM  ?Hepatic Function  ?Total Protein 6.0 - 8.5 g/dL 7.3   7.3   5.7    ?Albumin 3.5 - 4.6 g/dL 4.1   4.3   2.9    ?AST 0 - 40 IU/L 21   21   20     ?ALT 0 - 32 IU/L 11   11   11     ?Alk Phosphatase 44 - 121 IU/L 150   152   85    ?Total Bilirubin 0.0 - 1.2 mg/dL <0.2   0.2   0.4    ? ? ?  Lab Results  ?Component Value Date/Time  ? TSH 0.495 05/04/2020 12:00 AM  ? TSH 0.597 07/19/2016 09:13 PM  ? TSH 0.245 (L) 08/26/2011 06:25 AM  ? ? ? ?  Latest Ref Rng & Units 05/04/2020  ? 12:00 AM 10/06/2019  ?  3:20 PM 03/31/2018  ?  6:55 AM  ?CBC  ?WBC 3.4 - 10.8 x10E3/uL 7.9   6.7   11.4    ?Hemoglobin 11.1 - 15.9 g/dL 11.8   11.9   9.6    ?Hematocrit 34.0 - 46.6 % 36.0   36.1   31.7    ?Platelets 150 - 450 x10E3/uL 198   177   133    ? ? ?Lab Results  ?Component Value Date/Time  ? VD25OH 23.7 (L) 03/30/2018 03:26 PM  ? ? ?Clinical ASCVD: Yes  ?The ASCVD Risk score (Arnett DK, et al., 2019) failed to calculate for the following reasons: ?  The 2019 ASCVD risk score is only valid for ages 5 to 64 ?  The patient has a prior MI or stroke diagnosis   ? ? ?   12/05/2020  ?  3:02 PM 10/06/2019  ?  2:57 PM  ?Depression screen PHQ 2/9  ?Decreased Interest 0 1  ?Down, Depressed, Hopeless 0 0  ?PHQ - 2 Score 0 1  ?  ? ?Social History  ? ?Tobacco Use  ?Smoking Status Never  ?Smokeless Tobacco Never  ? ?BP Readings from Last 3 Encounters:  ?03/16/21 119/74  ?12/05/20 110/80  ?07/20/20 139/63  ? ?Pulse Readings from Last 3 Encounters:  ?03/16/21 (!) 57  ?12/05/20 60  ?07/20/20 (!) 56  ? ?Wt Readings from Last 3 Encounters:  ?03/16/21 99 lb 9.6 oz (45.2 kg)  ?12/05/20 99 lb 9.6 oz (45.2 kg)  ?05/04/20 93 lb (42.2 kg)  ? ?BMI Readings from Last 3 Encounters:  ?03/16/21 17.10 kg/m?  ?12/05/20 17.10 kg/m?  ?05/04/20 15.96 kg/m?  ? ? ?Assessment/Interventions: Review of patient past medical history, allergies, medications, health status, including review of consultants reports, laboratory and other test data, was performed as part of comprehensive evaluation and provision of chronic care management services.  ? ?SDOH:  (Social Determinants of Health) assessments and interventions performed: Yes ?SDOH Interventions   ? ?Flowsheet Row Most Recent Value  ?SDOH Interventions   ?Financial Strain Interventions Intervention Not Indicated  ? ?  ? ?SDOH Screenings  ? ?Alcohol Screen: Not on file  ?Depression (PHQ2-9): Low Risk   ? PHQ-2 Score: 0  ?Financial Resource Strain: Low Risk   ? Difficulty of Paying Living Expenses: Not hard at all  ?Food Insecurity: No Food Insecurity  ? Worried About Charity fundraiser in the Last Year: Never true  ? Ran Out of Food in the Last Year: Never true  ?Housing: Low Risk   ? Last Housing Risk Score: 0  ?Physical Activity: Not on file  ?Social Connections: Not on file  ?Stress: Not on file  ?Tobacco Use: Low Risk   ? Smoking Tobacco Use: Never  ? Smokeless Tobacco Use: Never  ? Passive Exposure: Not on file  ?Transportation Needs: Not on file  ? ? ?[place] ? ?Allergies  ?Allergen Reactions  ? Diphenhydramine Hcl Other (See Comments)  ?  unknown  ?  Penicillins Other (See Comments)  ?  unknown  ? Shellfish Allergy Other (See Comments)  ?  unknown  ? ? ?Medications Reviewed Today   ? ? Reviewed by Minus Breeding, MD (Physician) on 03/16/21 at  [place] List Status: <None>  ? ?Medication Order Taking? Sig Documenting Provider Last Dose Status Informant  ?amLODipine (NORVASC) 5 MG tablet [id number]  Take 1 tablet (5 mg total) by mouth daily. Lillard Anes, MD  Active   ?Ascorbic Acid (VITAMIN C) 100 MG tablet [id number] No Take by mouth daily. [provider] Taking Active Self  ?aspirin EC 81 MG tablet [id number] Yes Take 1 tablet (81 mg total) by mouth daily. Swallow whole. Minus Breeding, MD  Active   ?b complex vitamins tablet [id number] No Take 1 tablet by mouth daily. [provider] Taking Active Self  ?cholecalciferol (VITAMIN D3) 25 MCG (1000 UNIT) tablet [id number] No Take by mouth daily. [provider] Taking Active Self  ?Coenzyme Q10 (COQ10) 50 MG CAPS [id number] No Take 50 mg by mouth daily. [provider] Taking Active Child  ?dorzolamide-timolol (COSOPT) 22.3-6.8 MG/ML ophthalmic solution [id number] No 1 drop at bedtime. [provider] Taking Active Self  ?ELDERBERRY PO [id number] No Take by mouth daily. [provider] Taking Active Self  ?MAGNESIUM GLUCONATE PO [id number] No Take by mouth daily. [provider] Taking Active Self  ?metoprolol succinate (TOPROL-XL) 25 MG 24 hr tablet [id number] No Take 3 tablets (75 mg total) by mouth daily. Minus Breeding, MD Taking Active   ?Multiple Vitamins-Minerals (ADULT ONE DAILY GUMMIES) CHEW [id number] No Chew 2 tablets by mouth daily. [provider] Taking Active Child  ?nitroGLYCERIN (NITROSTAT) 0.4 MG SL tablet [id number]  Place 1 tablet (0.4 mg total) under the tongue every 5 (five) minutes as needed. For chest pain Lillard Anes, MD  Active   ?ramipril (ALTACE) 10 MG capsule [id number] No TAKE ONE CAPSULE BY MOUTH  EVERY DAY Lillard Anes, MD Taking Active   ?Discontinued 06/20/11 1013 (Error)   ?ticagrelor (BRILINTA) 90 MG TABS tablet [id number]  TAKE 1 TABLET BY MOUTH TWICE A DAY Minus Breeding, MD  Active   ?tr

## 2021-07-04 NOTE — Patient Instructions (Signed)
Visit Information ? ? Goals Addressed   ?None ?  ? ?Patient Care Plan: Tool  ?  ? ?Problem Identified: htn, hld, cad   ?Priority: High  ?Onset Date: 07/27/2020  ?  ? ?Long-Range Goal: Disease State Management   ?Start Date: 07/27/2020  ?Expected End Date: 07/27/2021  ?Recent Progress: On track  ?Priority: High  ?Note:   ?Current Barriers:  ?Unable to independently monitor therapeutic efficacy ? ?Pharmacist Clinical Goal(s):  ?Patient will achieve adherence to monitoring guidelines and medication adherence to achieve therapeutic efficacy through collaboration with PharmD and provider.  ? ?Interventions: ?1:1 collaboration with Lillard Anes, MD regarding development and update of comprehensive plan of care as evidenced by provider attestation and co-signature ?Inter-disciplinary care team collaboration (see longitudinal plan of care) ?Comprehensive medication review performed; medication list updated in electronic medical record ? ?Hypertension (BP goal <130/80) ?BP Readings from Last 3 Encounters:  ?03/16/21 119/74  ?12/05/20 110/80  ?07/20/20 139/63  ?-Controlled ?-Current treatment: ?amlodipine 5 mg daily Appropriate, Effective, Safe, Accessible ?Metoprolol succinate 25 mg 3 tablets daily Appropriate, Effective, Safe, Accessible ?Ramipril 10 gm daily Appropriate, Effective, Safe, Accessible ?-Medications previously tried: none reported  ?-Current home readings: well controlled per patient. Daughter/granddaughter check and state it is at goal.   ?-Current dietary habits:  drinking water. States has a good appetite.  ?-Current exercise habits: walks about house ?-Denies hypotensive/hypertensive symptoms ?-Educated on BP goals and benefits of medications for prevention of heart attack, stroke and kidney damage; ?Daily salt intake goal < 2300 mg; ?Importance of home blood pressure monitoring; ?Symptoms of hypotension and importance of maintaining adequate hydration; ?-Counseled to monitor BP at  home weekly, document, and provide log at future appointments ?-Counseled on diet and exercise extensively ?March 2023: Recommend Metoprolol Succ 50 + 25 vs 3x'25mg'$ .  ? ?CAD: (LDL goal < 55) ?-Controlled ?-Current treatment: ?ASA '81mg'$  Appropriate, Effective, Safe, Accessible ?Brilinta '90mg'$  Appropriate, Effective, Safe, Accessible ?-Medications previously tried: statin  ?04/05/21: Note by Minus Breeding, "We had long discussions before about the benefits and risks of DAPT.  I think given her severe disease we are managing medically and the fact that she has had no problems with dual antiplatelet therapy we will continue this." ?-Current dietary patterns: states she has a good appetite ?-Current exercise habits: active around house ?-Educated on Cholesterol goals;  ?Importance of limiting foods high in cholesterol; ?-Counseled on diet and exercise extensively ?Recommend continue current regimen ? ? ?Patient Goals/Self-Care Activities ?Patient will:  ?- take medications as prescribed ?focus on medication adherence by using pill box ? ?Follow Up Plan: Telephone follow up appointment with care management team member scheduled for: 06/2022 ? ?[place] Constable, Pharm.D. - [phone] ? ?  ? ? ?[name] was given information about Chronic Care Management services today including:  ?CCM service includes personalized support from designated clinical staff supervised by her physician, including individualized plan of care and coordination with other care providers ?24/7 contact phone numbers for assistance for urgent and routine care needs. ?Standard insurance, coinsurance, copays and deductibles apply for chronic care management only during months in which we provide at least 20 minutes of these services. Most insurances cover these services at 100%, however patients may be responsible for any copay, coinsurance and/or deductible if applicable. This service may help you avoid the need for more expensive face-to-face  services. ?Only one practitioner may furnish and bill the service in a calendar month. ?The patient may stop CCM services at any time (effective  at the end of the month) by phone call to the office staff. ? ?Patient agreed to services and verbal consent obtained.  ? ?The patient verbalized understanding of instructions, educational materials, and care plan provided today and declined offer to receive copy of patient instructions, educational materials, and care plan.  ?The pharmacy team will reach out to the patient again over the next 60 days.  ? ?[name], [name] ? ?

## 2021-07-13 DIAGNOSIS — I739 Peripheral vascular disease, unspecified: Secondary | ICD-10-CM | POA: Diagnosis not present

## 2021-07-13 DIAGNOSIS — I10 Essential (primary) hypertension: Secondary | ICD-10-CM

## 2021-07-26 ENCOUNTER — Other Ambulatory Visit: Payer: Self-pay | Admitting: Legal Medicine

## 2021-08-01 ENCOUNTER — Emergency Department (HOSPITAL_COMMUNITY)
Admission: EM | Admit: 2021-08-01 | Discharge: 2021-08-01 | Disposition: A | Discharge status: Home / Self Care | Payer: Medicare Other | Attending: Emergency Medicine | Admitting: Emergency Medicine

## 2021-08-01 ENCOUNTER — Encounter (HOSPITAL_COMMUNITY): Payer: Self-pay

## 2021-08-01 ENCOUNTER — Emergency Department (HOSPITAL_COMMUNITY): Payer: Medicare Other

## 2021-08-01 ENCOUNTER — Other Ambulatory Visit: Payer: Self-pay

## 2021-08-01 DIAGNOSIS — R1013 Epigastric pain: Secondary | ICD-10-CM | POA: Diagnosis not present

## 2021-08-01 DIAGNOSIS — R079 Chest pain, unspecified: Secondary | ICD-10-CM | POA: Diagnosis not present

## 2021-08-01 DIAGNOSIS — I2510 Atherosclerotic heart disease of native coronary artery without angina pectoris: Secondary | ICD-10-CM | POA: Insufficient documentation

## 2021-08-01 DIAGNOSIS — R531 Weakness: Secondary | ICD-10-CM | POA: Insufficient documentation

## 2021-08-01 DIAGNOSIS — Z7982 Long term (current) use of aspirin: Secondary | ICD-10-CM | POA: Diagnosis not present

## 2021-08-01 DIAGNOSIS — E875 Hyperkalemia: Secondary | ICD-10-CM | POA: Diagnosis not present

## 2021-08-01 DIAGNOSIS — R0789 Other chest pain: Secondary | ICD-10-CM | POA: Diagnosis not present

## 2021-08-01 DIAGNOSIS — Z20822 Contact with and (suspected) exposure to covid-19: Secondary | ICD-10-CM | POA: Insufficient documentation

## 2021-08-01 LAB — HEPATIC FUNCTION PANEL
ALT: 12 U/L (ref 0–44)
AST: 17 U/L (ref 15–41)
Albumin: 3.5 g/dL (ref 3.5–5.0)
Alkaline Phosphatase: 125 U/L (ref 38–126)
Bilirubin, Direct: <0.1 mg/dL (ref 0.0–0.2)
Total Bilirubin: 0.3 mg/dL (ref 0.3–1.2)
Total Protein: 6.4 g/dL — ABNORMAL LOW (ref 6.5–8.1)

## 2021-08-01 LAB — BASIC METABOLIC PANEL
Anion gap: 4 — ABNORMAL LOW (ref 5–15)
BUN: 39 mg/dL — ABNORMAL HIGH (ref 8–23)
CO2: 22 mmol/L (ref 22–32)
Calcium: 12 mg/dL — ABNORMAL HIGH (ref 8.9–10.3)
Chloride: 116 mmol/L — ABNORMAL HIGH (ref 98–111)
Creatinine, Ser: 1.54 mg/dL — ABNORMAL HIGH (ref 0.44–1.00)
GFR, Estimated: 31 mL/min — ABNORMAL LOW (ref >60)
Glucose, Bld: 99 mg/dL (ref 70–99)
Potassium: 5.4 mmol/L — ABNORMAL HIGH (ref 3.5–5.1)
Sodium: 142 mmol/L (ref 135–145)

## 2021-08-01 LAB — CBC
HCT: 36.8 % (ref 36.0–46.0)
Hemoglobin: 11.4 g/dL — ABNORMAL LOW (ref 12.0–15.0)
MCH: 32.3 pg (ref 26.0–34.0)
MCHC: 31 g/dL (ref 30.0–36.0)
MCV: 104.2 fL — ABNORMAL HIGH (ref 80.0–100.0)
Platelets: 175 10*3/uL (ref 150–400)
RBC: 3.53 MIL/uL — ABNORMAL LOW (ref 3.87–5.11)
RDW: 13.6 % (ref 11.5–15.5)
WBC: 7.1 10*3/uL (ref 4.0–10.5)
nRBC: 0 % (ref 0.0–0.2)

## 2021-08-01 LAB — TROPONIN I (HIGH SENSITIVITY)
Troponin I (High Sensitivity): 83 ng/L — ABNORMAL HIGH (ref <18)
Troponin I (High Sensitivity): 77 ng/L — ABNORMAL HIGH (ref <18)

## 2021-08-01 LAB — RESP PANEL BY RT-PCR (FLU A&B, COVID) ARPGX2
Influenza A by PCR: NEGATIVE
Influenza B by PCR: NEGATIVE
SARS Coronavirus 2 by RT PCR: NEGATIVE

## 2021-08-01 LAB — LIPASE, BLOOD: Lipase: 24 U/L (ref 11–51)

## 2021-08-01 MED ORDER — SODIUM CHLORIDE 0.9 % IV BOLUS
1000.0000 mL | Freq: Once | INTRAVENOUS | Status: AC
  Administered 2021-08-01: 1000 mL via INTRAVENOUS

## 2021-08-01 NOTE — ED Provider Triage Note (Signed)
Emergency Medicine Provider Triage Evaluation Note ? ?[name] , a 88 y.o. female  was evaluated in triage.  Pt complains of general weakness.  Patient states that for the past 2 weeks she "just has not quite felt well".  When questioned further the patient states that she feels weak.  She states that her appetite has been normal, denies chest pain, denies shortness of breath, denies abdominal pain.  Endorses generalized weakness.  No loss of consciousness, no falls ? ?Review of Systems  ?Positive: Generalized weakness ?Negative: Chest pain, shortness of breath ? ?Physical Exam  ?BP 132/77 (BP Location: Right Arm)   Pulse 78   Temp 98.7 ?F (37.1 ?C) (Oral)   Resp 16   Ht '5\' 4"'$  (1.626 m)   Wt 44.9 kg   SpO2 99%   BMI 16.99 kg/m?  ?Gen:   Awake, no distress   ?Resp:  Normal effort  ?MSK:   Moves extremities without difficulty  ?Other:   ? ?Medical Decision Making  ?Medically screening exam initiated at 2:20 PM.  Appropriate orders placed.  [name] was informed that the remainder of the evaluation will be completed by another provider, this initial triage assessment does not replace that evaluation, and the importance of remaining in the ED until their evaluation is complete. ? ? ?  ?Dorothyann Peng, PA-C ?08/01/21 1421 ? ?

## 2021-08-01 NOTE — ED Triage Notes (Addendum)
Per PT[facility], Pt, from home, c/o epigastic pain x 2-3 days.  Pain increases w/ palpation. Denies n/v/d.  ?

## 2021-08-01 NOTE — Discharge Instructions (Signed)
Please call to follow-up with your cardiologist for these episodes of chest pain.  He can continue to use the nitroglycerin as it is prescribed at home, taking up to 3 doses 5 minutes apart for chest pain.  If your pain is not completely relieved with the nitroglycerin, or if you are beginning to feel lightheaded or dizzy or short of breath, call 911 and come back to the hospital ?

## 2021-08-01 NOTE — ED Provider Notes (Signed)
?[facility] DEPT ?Provider Note ? ? ?CSN: [id number] ?Arrival date & time: 08/01/21  1345 ? ?  ? ?History ? ?Chief Complaint  ?Patient presents with  ? Epigatric Pain   ? ? ?[name] is a 88 y.o. female presenting from home with complaint of weakness and chest discomfort .  Patient has no acute complaints, says she feels well and is asymptomatic.  Her daughter at bedside reports that she advised the patient to come into the hospital because the patient was having recurring episodes of chest pain this week.  Patient does have a coronary history as noted below.  Her daughter reports the patient was complaining of chest pain on and off all week, including when completely at rest, and also after eating.  The chest pain does tend to resolve and go away completely with sublingual nitroglycerin.  The patient is currently pain-free ? ?She denies nausea, vomiting, diarrhea or constipation.  Denies head injury. ? ?No prior history of that she has a history of hypertension and high cholesterol ? ?Hx of CAD, last LHC in Jan 2018, with PCI of RCA for inferior STEMI at the time ?Ost LM to LM lesion, 60 %stenosed. ?Prox Cx to Mid Cx lesion, 0 %stenosed. ?Mid LAD lesion, 90 %stenosed. ?Ost RCA to Dist RCA lesion, 100 %stenosed. ?Dist RCA lesion, 95 %stenosed. ?Post intervention, there is a 0% residual stenosis. ?A stent was successfully placed. ?There is mild left ventricular systolic dysfunction. ?LV end diastolic pressure is mildly elevated. ?The left ventricular ejection fraction is 50-55% by visual estimate. ? ?HPI ? ?  ? ?Home Medications ?Prior to Admission medications   ?Medication Sig Start Date End Date Taking? Authorizing Provider  ?amLODipine (NORVASC) 5 MG tablet Take 1 tablet (5 mg total) by mouth daily. 02/27/21   Lillard Anes, MD  ?Ascorbic Acid (VITAMIN C) 100 MG tablet Take by mouth daily.    [provider]  ?aspirin EC 81 MG tablet Take 1 tablet (81 mg total)  by mouth daily. Swallow whole. 03/16/21   Minus Breeding, MD  ?b complex vitamins tablet Take 1 tablet by mouth daily.    [provider]  ?BRILINTA 90 MG TABS tablet TAKE 1 TABLET BY MOUTH TWICE A DAY 04/26/21   Minus Breeding, MD  ?cholecalciferol (VITAMIN D3) 25 MCG (1000 UNIT) tablet Take by mouth daily.    [provider]  ?Coenzyme Q10 (COQ10) 50 MG CAPS Take 50 mg by mouth daily.    [provider]  ?dorzolamide-timolol (COSOPT) 22.3-6.8 MG/ML ophthalmic solution 1 drop at bedtime.    [provider]  ?ELDERBERRY PO Take by mouth daily.    [provider]  ?MAGNESIUM GLUCONATE PO Take by mouth daily.    [provider]  ?metoprolol succinate (TOPROL-XL) 25 MG 24 hr tablet TAKE 3 TABLETS BY MOUTH DAILY 04/05/21   Minus Breeding, MD  ?Multiple Vitamins-Minerals (ADULT ONE DAILY GUMMIES) CHEW Chew 2 tablets by mouth daily.    [provider]  ?nitroGLYCERIN (NITROSTAT) 0.4 MG SL tablet Place 1 tablet (0.4 mg total) under the tongue every 5 (five) minutes as needed. For chest pain 12/05/20   Lillard Anes, MD  ?ramipril (ALTACE) 10 MG capsule TAKE 1 CAPSULE BY MOUTH EVERY DAY 07/26/21   Lillard Anes, MD  ?traMADol Veatrice [place]bon) 50 MG tablet TAKE 2 TABLETS BY MOUTH EVERY DAY 07/01/21   Lillard Anes, MD  ?simvastatin (ZOCOR) 40 MG tablet Take 1 tablet (40 mg  total) by mouth at bedtime. 04/16/11 06/20/11  Hester Mates, MD  ?   ? ?Allergies    ?Diphenhydramine hcl, Penicillins, and Shellfish allergy   ? ?Review of Systems   ?Review of Systems ? ?Physical Exam ?Updated Vital Signs ?BP 117/80 (BP Location: Right Arm)   Pulse 86   Temp 98.6 ?F (37 ?C) (Oral)   Resp 18   Ht '5\' 4"'$  (1.626 m)   Wt 44.9 kg   SpO2 98%   BMI 16.99 kg/m?  ?Physical Exam ?Constitutional:   ?   General: She is not in acute distress. ?HENT:  ?   Head: Normocephalic and atraumatic.  ?Eyes:  ?   Conjunctiva/sclera: Conjunctivae normal.  ?   Pupils: Pupils are  equal, round, and reactive to light.  ?Cardiovascular:  ?   Rate and Rhythm: Normal rate and regular rhythm.  ?Pulmonary:  ?   Effort: Pulmonary effort is normal. No respiratory distress.  ?Abdominal:  ?   General: There is no distension.  ?   Tenderness: There is no abdominal tenderness.  ?Skin: ?   General: Skin is warm and dry.  ?Neurological:  ?   General: No focal deficit present.  ?   Mental Status: She is alert. Mental status is at baseline.  ?Psychiatric:     ?   Mood and Affect: Mood normal.     ?   Behavior: Behavior normal.  ? ? ?ED Results / Procedures / Treatments   ?Labs ?(all labs ordered are listed, but only abnormal results are displayed) ?Labs Reviewed  ?BASIC METABOLIC PANEL - Abnormal; Notable for the following components:  ?    Result Value  ? Potassium 5.4 (*)   ? Chloride 116 (*)   ? BUN 39 (*)   ? Creatinine, Ser 1.54 (*)   ? Calcium 12.0 (*)   ? GFR, Estimated 31 (*)   ? Anion gap 4 (*)   ? All other components within normal limits  ?CBC - Abnormal; Notable for the following components:  ? RBC 3.53 (*)   ? Hemoglobin 11.4 (*)   ? MCV 104.2 (*)   ? All other components within normal limits  ?HEPATIC FUNCTION PANEL - Abnormal; Notable for the following components:  ? Total Protein 6.4 (*)   ? All other components within normal limits  ?TROPONIN I (HIGH SENSITIVITY) - Abnormal; Notable for the following components:  ? Troponin I (High Sensitivity) 83 (*)   ? All other components within normal limits  ?TROPONIN I (HIGH SENSITIVITY) - Abnormal; Notable for the following components:  ? Troponin I (High Sensitivity) 77 (*)   ? All other components within normal limits  ?RESP PANEL BY RT-PCR (FLU A&B, COVID) ARPGX2  ?LIPASE, BLOOD  ? ? ?EKG ?EKG Interpretation ? ?Date/Time:  Wednesday August 01 2021 18:04:19 EDT ?Ventricular Rate:  61 ?PR Interval:  200 ?QRS Duration: 90 ?QT Interval:  392 ?QTC Calculation: 395 ?R Axis:   -5 ?Text Interpretation: Sinus rhythm Low voltage, precordial leads Confirmed  by Octaviano Glow [phone]) on 08/01/2021 6:06:45 PM ? ?Radiology ?DG Chest 2 View ? ?Result Date: 08/01/2021 ?CLINICAL DATA:  Chest pain EXAM: CHEST - 2 VIEW COMPARISON:  07/19/2016 FINDINGS: No focal consolidation. No pleural effusion or pneumothorax. Heart and mediastinal contours are unremarkable. Coronary artery atherosclerosis. No acute osseous abnormality. S-shaped scoliosis of the thoracolumbar spine. Severe osteoarthritis of the glenohumeral joints. IMPRESSION: 1. No active cardiopulmonary disease. Electronically Signed   By: [place] Service.D.  On: 08/01/2021 14:16   ? ?Procedures ?Procedures  ? ? ?Medications Ordered in ED ?Medications  ?sodium chloride 0.9 % bolus 1,000 mL (0 mLs Intravenous Stopped 08/01/21 2012)  ? ? ?ED Course/ Medical Decision Making/ A&P ?  ?                        ?Medical Decision Making ?Amount and/or Complexity of Data Reviewed ?Labs: ordered. ?Radiology: ordered. ? ? ?This patient presents to the ED with concern for generalized weakness. This involves an extensive number of treatment options, and is a complaint that carries with it a high risk of complications and morbidity.  The differential diagnosis includes infection vs anemia vs dehydration vs atypical ACS (no chest pain) vs other ? ?Co-morbidities that complicate the patient evaluation: age, hx of coronary disease ? ?Additional history obtained from daughter at bedside ? ?External records from outside source obtained and reviewed including LHC report as shown above ? ?I ordered and personally interpreted labs.  The pertinent results include:  trop 83 -> 77.  No acute anemia.  No leukocytosis.  There is some mild hyperkalemia in the setting of possible mild dehydration ?Bmp shows a little bit of dehydration with elevated BUN and creatinine and mildly elevated potassium.  We will give her a 1L IVF which may help with this ? ?No significant anemia or leukocytosis ? ?I ordered imaging studies including dg chest ?I independently  visualized and interpreted imaging which showed no focal PNA or PTX ?I agree with the radiologist interpretation ? ?The patient was maintained on a cardiac monitor.  I personally viewed and interpreted the cardi

## 2021-08-03 ENCOUNTER — Encounter: Payer: Self-pay | Admitting: Legal Medicine

## 2021-08-03 ENCOUNTER — Ambulatory Visit (INDEPENDENT_AMBULATORY_CARE_PROVIDER_SITE_OTHER): Payer: Medicare Other | Admitting: Legal Medicine

## 2021-08-03 VITALS — BP 156/84 | HR 85 | Temp 97.7°F | Resp 15 | Ht 64.0 in | Wt 98.0 lb

## 2021-08-03 DIAGNOSIS — I110 Hypertensive heart disease with heart failure: Secondary | ICD-10-CM | POA: Insufficient documentation

## 2021-08-03 DIAGNOSIS — M86142 Other acute osteomyelitis, left hand: Secondary | ICD-10-CM | POA: Diagnosis not present

## 2021-08-03 DIAGNOSIS — E782 Mixed hyperlipidemia: Secondary | ICD-10-CM

## 2021-08-03 DIAGNOSIS — E440 Moderate protein-calorie malnutrition: Secondary | ICD-10-CM | POA: Diagnosis not present

## 2021-08-03 DIAGNOSIS — E211 Secondary hyperparathyroidism, not elsewhere classified: Secondary | ICD-10-CM | POA: Diagnosis not present

## 2021-08-03 DIAGNOSIS — E213 Hyperparathyroidism, unspecified: Secondary | ICD-10-CM | POA: Diagnosis not present

## 2021-08-03 DIAGNOSIS — I2510 Atherosclerotic heart disease of native coronary artery without angina pectoris: Secondary | ICD-10-CM

## 2021-08-03 DIAGNOSIS — Z8673 Personal history of transient ischemic attack (TIA), and cerebral infarction without residual deficits: Secondary | ICD-10-CM | POA: Diagnosis not present

## 2021-08-03 DIAGNOSIS — I25119 Atherosclerotic heart disease of native coronary artery with unspecified angina pectoris: Secondary | ICD-10-CM

## 2021-08-03 DIAGNOSIS — I739 Peripheral vascular disease, unspecified: Secondary | ICD-10-CM | POA: Diagnosis not present

## 2021-08-03 DIAGNOSIS — K219 Gastro-esophageal reflux disease without esophagitis: Secondary | ICD-10-CM | POA: Diagnosis not present

## 2021-08-03 DIAGNOSIS — R079 Chest pain, unspecified: Secondary | ICD-10-CM

## 2021-08-03 MED ORDER — FAMOTIDINE 40 MG PO TABS: 40.0000 mg | ORAL_TABLET | Freq: Every day | ORAL | 2 refills | Status: DC

## 2021-08-03 NOTE — Progress Notes (Signed)
? ?Subjective:  ?Patient ID: [name], female    DOB: January 22, 1926  Age: 88 y.o. MRN: [id number] ? ?Chief Complaint  ?Patient presents with  ? Follow-up  ? Abdominal Pain  ? ? ?HPI ? Patient was seen on 08/01/2021 for chest pain. They did 2 EKG and they were normal. They did not give her any medication. This pain has been comes and goes for 6 days. Her pain was relieved by NTG and she was mildly dehydrated and treated with IV fluids.She is on dual therapy. Tums helps her pains. ?Current Outpatient Medications on File Prior to Visit  ?Medication Sig Dispense Refill  ? amLODipine (NORVASC) 5 MG tablet Take 1 tablet (5 mg total) by mouth daily. 90 tablet 1  ? Ascorbic Acid (VITAMIN C) 100 MG tablet Take by mouth daily.    ? aspirin EC 81 MG tablet Take 1 tablet (81 mg total) by mouth daily. Swallow whole. 90 tablet 3  ? b complex vitamins tablet Take 1 tablet by mouth daily.    ? BRILINTA 90 MG TABS tablet TAKE 1 TABLET BY MOUTH TWICE A DAY 180 tablet 0  ? cholecalciferol (VITAMIN D3) 25 MCG (1000 UNIT) tablet Take by mouth daily.    ? Coenzyme Q10 (COQ10) 50 MG CAPS Take 50 mg by mouth daily.    ? dorzolamide-timolol (COSOPT) 22.3-6.8 MG/ML ophthalmic solution 1 drop at bedtime.    ? ELDERBERRY PO Take by mouth daily.    ? MAGNESIUM GLUCONATE PO Take by mouth daily.    ? metoprolol succinate (TOPROL-XL) 25 MG 24 hr tablet TAKE 3 TABLETS BY MOUTH DAILY 270 tablet 3  ? Multiple Vitamins-Minerals (ADULT ONE DAILY GUMMIES) CHEW Chew 2 tablets by mouth daily.    ? nitroGLYCERIN (NITROSTAT) 0.4 MG SL tablet Place 1 tablet (0.4 mg total) under the tongue every 5 (five) minutes as needed. For chest pain 25 tablet 2  ? ramipril (ALTACE) 10 MG capsule TAKE 1 CAPSULE BY MOUTH EVERY DAY 90 capsule 2  ? traMADol (ULTRAM) 50 MG tablet TAKE 2 TABLETS BY MOUTH EVERY DAY 60 tablet 3  ? [DISCONTINUED] simvastatin (ZOCOR) 40 MG tablet Take 1 tablet (40 mg total) by mouth at bedtime. 30 tablet 0  ? ?No current facility-administered  medications on file prior to visit.  ? ?Past Medical History:  ?Diagnosis Date  ? Abscess 2003  ? I&D SCM abscess  ? Acute ischemic colitis ([place]) 04/10/11  ? acute episode of ischemic colitis, with abd pain, CT evidence of transverse colitis, and blood streaked BM.  Resolved with conservative treatment.  ? Arthritis   ? "hands, joints, knees" (07/19/2016)  ? Bacteremia 2003  ? hx of staph bacteremia  ? CAD (coronary artery disease) 2003, 2010  ? s/p stenting of RCA x2, s/p stent to OM;  LHC 04/2009 in the setting of a NSTEMI: EF 65%, RCA stent patent with 60% ISR proximal and 40% ISR mid, PLV proximal 50-60% and distal 50%, ostial circumflex 30-40%, proximal OM mild in-stent restenosis with a branch with 90+ percent ostial stenosis, proximal LAD 30-40%.  FFR of ostial circumflex:  0.96 - not flow limiting.  Medical therapy was continued.  ? Chronic lower back pain   ? "not a big problem" (07/19/2016)  ? Closed displaced fracture of distal phalanx of left middle finger 05/23/2017  ? Degenerative disc disease   ? GERD (gastroesophageal reflux disease)   ? History of medication noncompliance   ? concerning Plavix  ? HTN (hypertension)   ?  Echocardiogram 08/26/11: Mild focal basal septal hypertrophy, EF 94-76%, grade 2 diastolic dysfunction, mild MR.  There was a fluid-filled area likely in the location of the liver noted  ? Hyperlipidemia   ? Hyperparathyroidism 2003  ? parathyroid adenoma localized to the right inferior thyroid lobe region  ? Hypothyroidism   ? Left renal artery stenosis (HCC)   ? 40%  ? Lumbar stenosis with neurogenic claudication 2006  ? s/p decompression L2-L5  ? Myocardial infarction [facility]) ~ 2013; 05/06/2016  ? Osteoporosis 2009  ? diagnosed by bone density scan in 2009, on Vit D only, no bisphosphonate therapy  ? Phlebitis   ? LLE  ? PVD (peripheral vascular disease) ([place])   ? s/p left fem-pop bypass graft  ? TIA (transient ischemic attack)   ? carotid dopplers 5/13: no ICA stenosis  ? ?Past Surgical  History:  ?Procedure Laterality Date  ? AMPUTATION FINGER Left 05/24/2017  ? Procedure: AMPUTATION tip of long FINGER;  Surgeon: Dayna Barker, MD;  Location: [place];  Service: Plastics;  Laterality: Left;  ? CARDIAC CATHETERIZATION N/A 05/06/2016  ? Procedure: Left Heart Cath and Coronary Angiography;  Surgeon: Lorretta Harp, MD;  Location: [place] [facility];  Service: Cardiovascular;  Laterality: N/A;  ? CARDIAC CATHETERIZATION N/A 05/06/2016  ? Procedure: Coronary Stent Intervention;  Surgeon: Lorretta Harp, MD;  Location: [place] [facility];  Service: Cardiovascular;  Laterality: N/A;  RCA  ? CATARACT EXTRACTION, BILATERAL Bilateral   ? CORONARY ANGIOPLASTY WITH STENT PLACEMENT  09/2008  ? CORONARY ANGIOPLASTY WITH STENT PLACEMENT  04/2001  ? ENDOSCOPIC HEMILAMINOTOMY W/ DISCECTOMY LUMBAR  06/2004  ? L3, L4 lami; L2, L5 hemilami; decompresion L2-3, 3-4, 4-  ? FEMORAL BYPASS  before 2003  ? LLE  ? I & D EXTREMITY Left 05/24/2017  ? Procedure: IRRIGATION AND DEBRIDEMENT LEFT LONG FINGER;  Surgeon: Dayna Barker, MD;  Location: [place];  Service: Plastics;  Laterality: Left;  ? INCISION AND DRAINAGE ANTERIOR NECK  10/2001  ? absces sternoclavicular joint  ?  ?Family History  ?Problem Relation Age of Onset  ? Stroke Mother   ? Stroke Father   ? ?Social History  ? ?Socioeconomic History  ? Marital status: Widowed  ?  Spouse name: Not on file  ? Number of children: Not on file  ? Years of education: Not on file  ? Highest education level: Not on file  ?Occupational History  ? Not on file  ?Tobacco Use  ? Smoking status: Never  ? Smokeless tobacco: Never  ?Substance and Sexual Activity  ? Alcohol use: No  ? Drug use: No  ? Sexual activity: Never  ?Other Topics Concern  ? Not on file  ?Social History Narrative  ? Lives with daughter and granddaughter   ? PCP: Dr. Reinaldo Meeker in [place]  ?   ?   ? ?Social Determinants of Health  ? ?Financial Resource Strain: Low Risk   ? Difficulty of Paying Living Expenses: Not hard at  all  ?Food Insecurity: Not on file  ?Transportation Needs: Not on file  ?Physical Activity: Not on file  ?Stress: Not on file  ?Social Connections: Not on file  ? ? ?Review of Systems  ?Constitutional:  Negative for chills, fatigue and fever.  ?HENT:  Negative for congestion, ear pain and sore throat.   ?Respiratory:  Negative for cough and shortness of breath.   ?Cardiovascular:  Positive for chest pain. Negative for palpitations.  ?Gastrointestinal:  Positive for abdominal pain (  epigastric pain). Negative for constipation, diarrhea, nausea and vomiting.  ?Endocrine: Negative for polydipsia, polyphagia and polyuria.  ?Genitourinary:  Negative for difficulty urinating and dysuria.  ?Musculoskeletal:  Negative for arthralgias, back pain and myalgias.  ?Skin:  Negative for rash.  ?Neurological:  Negative for headaches.  ?Psychiatric/Behavioral:  Negative for dysphoric mood. The patient is not nervous/anxious.   ? ? ?Objective:  ?BP (!) 156/84   Pulse 85   Temp 97.7 ?F (36.5 ?C)   Resp 15   Ht '5\' 4"'$  (1.626 m)   Wt 98 lb (44.5 kg)   SpO2 100%   BMI 16.82 kg/m?  ? ? ?  08/03/2021  ? 11:11 AM 08/01/2021  ?  8:12 PM 08/01/2021  ?  7:45 PM  ?BP/Weight  ?Systolic BP 427 062 376  ?Diastolic BP 84 80 73  ?Wt. (Lbs) 98    ?BMI 16.82 kg/m2    ? ? ?Physical Exam ?Vitals reviewed.  ?Constitutional:   ?   General: She is not in acute distress. ?   Appearance: She is well-developed.  ?HENT:  ?   Right Ear: Tympanic membrane normal.  ?   Left Ear: Tympanic membrane normal.  ?   Mouth/Throat:  ?   Mouth: Mucous membranes are moist.  ?Eyes:  ?   Extraocular Movements: Extraocular movements intact.  ?   Conjunctiva/sclera: Conjunctivae normal.  ?   Pupils: Pupils are equal, round, and reactive to light.  ?Cardiovascular:  ?   Rate and Rhythm: Normal rate and regular rhythm.  ?   Pulses: Normal pulses.  ?   Heart sounds: Normal heart sounds. No murmur heard. ?  No gallop.  ?Pulmonary:  ?   Effort: Pulmonary effort is normal. No  respiratory distress.  ?   Breath sounds: Normal breath sounds. No wheezing.  ?Abdominal:  ?   General: Abdomen is flat. Bowel sounds are normal. There is no distension.  ?   Palpations: Abdomen is soft.  ?   [place]

## 2021-08-04 LAB — CBC WITH DIFFERENTIAL/PLATELET
Basophils Absolute: 0.1 10*3/uL (ref 0.0–0.2)
Basos: 1 %
EOS (ABSOLUTE): 0.6 10*3/uL — ABNORMAL HIGH (ref 0.0–0.4)
Eos: 8 %
Hematocrit: 35.4 % (ref 34.0–46.6)
Hemoglobin: 11.4 g/dL (ref 11.1–15.9)
Immature Grans (Abs): 0 10*3/uL (ref 0.0–0.1)
Immature Granulocytes: 0 %
Lymphocytes Absolute: 2 10*3/uL (ref 0.7–3.1)
Lymphs: 25 %
MCH: 31.7 pg (ref 26.6–33.0)
MCHC: 32.2 g/dL (ref 31.5–35.7)
MCV: 98 fL — ABNORMAL HIGH (ref 79–97)
Monocytes Absolute: 0.6 10*3/uL (ref 0.1–0.9)
Monocytes: 8 %
Neutrophils Absolute: 4.6 10*3/uL (ref 1.4–7.0)
Neutrophils: 58 %
Platelets: 176 10*3/uL (ref 150–450)
RBC: 3.6 x10E6/uL — ABNORMAL LOW (ref 3.77–5.28)
RDW: 12.8 % (ref 11.7–15.4)
WBC: 7.8 10*3/uL (ref 3.4–10.8)

## 2021-08-04 LAB — COMPREHENSIVE METABOLIC PANEL
ALT: 11 IU/L (ref 0–32)
AST: 19 IU/L (ref 0–40)
Albumin/Globulin Ratio: 1.5 (ref 1.2–2.2)
Albumin: 4.3 g/dL (ref 3.5–4.6)
Alkaline Phosphatase: 165 IU/L — ABNORMAL HIGH (ref 44–121)
BUN/Creatinine Ratio: 16 (ref 12–28)
BUN: 24 mg/dL (ref 10–36)
Bilirubin Total: 0.2 mg/dL (ref 0.0–1.2)
CO2: 19 mmol/L — ABNORMAL LOW (ref 20–29)
Calcium: 13 mg/dL — ABNORMAL HIGH (ref 8.7–10.3)
Chloride: 110 mmol/L — ABNORMAL HIGH (ref 96–106)
Creatinine, Ser: 1.5 mg/dL — ABNORMAL HIGH (ref 0.57–1.00)
Globulin, Total: 2.9 g/dL (ref 1.5–4.5)
Glucose: 100 mg/dL — ABNORMAL HIGH (ref 70–99)
Potassium: 5.4 mmol/L — ABNORMAL HIGH (ref 3.5–5.2)
Sodium: 142 mmol/L (ref 134–144)
Total Protein: 7.2 g/dL (ref 6.0–8.5)
eGFR: 32 mL/min/{1.73_m2} — ABNORMAL LOW (ref >59)

## 2021-08-04 LAB — LIPID PANEL
Chol/HDL Ratio: 4.6 ratio — ABNORMAL HIGH (ref 0.0–4.4)
Cholesterol, Total: 290 mg/dL — ABNORMAL HIGH (ref 100–199)
HDL: 63 mg/dL (ref >39)
LDL Chol Calc (NIH): 197 mg/dL — ABNORMAL HIGH (ref 0–99)
Triglycerides: 162 mg/dL — ABNORMAL HIGH (ref 0–149)
VLDL Cholesterol Cal: 30 mg/dL (ref 5–40)

## 2021-08-04 LAB — CARDIOVASCULAR RISK ASSESSMENT

## 2021-08-05 NOTE — Progress Notes (Signed)
Glucose 100, kidney still stage 4, potassium high 5.4 do not take any potassium supplements, needs nephrology consult if she has not yet had one, calcium high 13.0 consider endocrinologist for hyperparathyroidism, liver tests ok, triglycerides 162, LDL cholesterol high 197, CBC ok ?lp

## 2021-08-07 ENCOUNTER — Telehealth: Payer: Self-pay | Admitting: Legal Medicine

## 2021-08-07 NOTE — Telephone Encounter (Signed)
I called [name] granddaughter who is wondering why we just found out how bad her kidneys were I have reviewed reviewed the records for the last 3 years and I noted that the EGFR was not recorded from [facility] until this last time although I have ordered the same labs for years.  Her creatinine levels have remained stable for at least the last year with EGFR GFR showed that she really was worse than I thought.  I still recommend a renal consult. ?lp ?

## 2021-08-09 ENCOUNTER — Other Ambulatory Visit: Payer: Self-pay

## 2021-08-09 DIAGNOSIS — N1832 Chronic kidney disease, stage 3b: Secondary | ICD-10-CM

## 2021-08-09 DIAGNOSIS — E213 Hyperparathyroidism, unspecified: Secondary | ICD-10-CM | POA: Insufficient documentation

## 2021-08-09 DIAGNOSIS — E210 Primary hyperparathyroidism: Secondary | ICD-10-CM | POA: Insufficient documentation

## 2021-08-11 ENCOUNTER — Other Ambulatory Visit: Payer: Self-pay | Admitting: Cardiology

## 2021-08-22 ENCOUNTER — Ambulatory Visit (INDEPENDENT_AMBULATORY_CARE_PROVIDER_SITE_OTHER): Payer: Medicare Other

## 2021-08-22 DIAGNOSIS — Z0000 Encounter for general adult medical examination without abnormal findings: Secondary | ICD-10-CM

## 2021-08-22 NOTE — Progress Notes (Signed)
? ?Subjective:  ? [name] is a 88 y.o. female who presents for an Initial Medicare Annual Wellness Visit. ? ?I connected with  [name] on 08/22/21 by a audio enabled telemedicine application and verified that I am speaking with the correct person using two identifiers. ? ?Patient Location: Home ? ?Provider Location: Home Office ? ?I discussed the limitations of evaluation and management by telemedicine. The patient expressed understanding and agreed to proceed.  ? ?  ? ?   ?Objective:  ?  ?There were no vitals filed for this visit. ?There is no height or weight on file to calculate BMI. ? ? ?  08/01/2021  ?  2:04 PM 03/30/2018  ?  4:26 PM 03/30/2018  ?  4:22 PM 05/23/2017  ? 12:49 PM 07/19/2016  ?  9:18 PM 07/19/2016  ?  5:23 PM 06/22/2016  ?  8:17 PM  ?Advanced Directives  ?Does Patient Have a Medical Advance Directive? No Yes Yes Yes No No Yes  ?Type of Armed forces technical officer of [employer] Power of [place] will  ?Does patient want to make changes to medical advance directive?  No - Patient declined     No - Patient declined  ?Copy of [place] in Chart?  No - copy requested No - copy requested      ?Would patient like information on creating a medical advance directive? No - Patient declined No - Patient declined No - Patient declined  Yes (Inpatient - patient defers creating a medical advance directive at this time)    ? ? ?Current Medications (verified) ?Outpatient Encounter Medications as of 08/22/2021  ?Medication Sig  ? amLODipine (NORVASC) 5 MG tablet Take 1 tablet (5 mg total) by mouth daily.  ? Ascorbic Acid (VITAMIN C) 100 MG tablet Take by mouth daily.  ? aspirin EC 81 MG tablet Take 1 tablet (81 mg total) by mouth daily. Swallow whole.  ? b complex vitamins tablet Take 1 tablet by mouth daily.  ? cholecalciferol (VITAMIN D3) 25 MCG (1000 UNIT) tablet Take by mouth daily.  ? Coenzyme Q10 (COQ10) 50 MG CAPS Take 50 mg by  mouth daily.  ? dorzolamide-timolol (COSOPT) 22.3-6.8 MG/ML ophthalmic solution 1 drop at bedtime.  ? ELDERBERRY PO Take by mouth daily.  ? famotidine (PEPCID) 40 MG tablet Take 1 tablet (40 mg total) by mouth daily.  ? MAGNESIUM GLUCONATE PO Take by mouth daily.  ? metoprolol succinate (TOPROL-XL) 25 MG 24 hr tablet TAKE 3 TABLETS BY MOUTH DAILY  ? Multiple Vitamins-Minerals (ADULT ONE DAILY GUMMIES) CHEW Chew 2 tablets by mouth daily.  ? nitroGLYCERIN (NITROSTAT) 0.4 MG SL tablet Place 1 tablet (0.4 mg total) under the tongue every 5 (five) minutes as needed. For chest pain  ? ramipril (ALTACE) 10 MG capsule TAKE 1 CAPSULE BY MOUTH EVERY DAY  ? ticagrelor (BRILINTA) 90 MG TABS tablet TAKE 1 TABLET BY MOUTH TWICE A DAY  ? traMADol (ULTRAM) 50 MG tablet TAKE 2 TABLETS BY MOUTH EVERY DAY  ? [DISCONTINUED] simvastatin (ZOCOR) 40 MG tablet Take 1 tablet (40 mg total) by mouth at bedtime.  ? ?No facility-administered encounter medications on file as of 08/22/2021.  ? ? ?Allergies (verified) ?Diphenhydramine hcl, Penicillins, and Shellfish allergy  ? ?History: ?Past Medical History:  ?Diagnosis Date  ? Abscess 2003  ? I&D SCM abscess  ? Acute ischemic colitis ([place]) 04/10/11  ? acute episode of ischemic colitis,  with abd pain, CT evidence of transverse colitis, and blood streaked BM.  Resolved with conservative treatment.  ? Arthritis   ? "hands, joints, knees" (07/19/2016)  ? Bacteremia 2003  ? hx of staph bacteremia  ? CAD (coronary artery disease) 2003, 2010  ? s/p stenting of RCA x2, s/p stent to OM;  LHC 04/2009 in the setting of a NSTEMI: EF 65%, RCA stent patent with 60% ISR proximal and 40% ISR mid, PLV proximal 50-60% and distal 50%, ostial circumflex 30-40%, proximal OM mild in-stent restenosis with a branch with 90+ percent ostial stenosis, proximal LAD 30-40%.  FFR of ostial circumflex:  0.96 - not flow limiting.  Medical therapy was continued.  ? Chronic lower back pain   ? "not a big problem" (07/19/2016)  ?  Closed displaced fracture of distal phalanx of left middle finger 05/23/2017  ? Degenerative disc disease   ? GERD (gastroesophageal reflux disease)   ? History of medication noncompliance   ? concerning Plavix  ? HTN (hypertension)   ? Echocardiogram 08/26/11: Mild focal basal septal hypertrophy, EF 18-56%, grade 2 diastolic dysfunction, mild MR.  There was a fluid-filled area likely in the location of the liver noted  ? Hyperlipidemia   ? Hyperparathyroidism 2003  ? parathyroid adenoma localized to the right inferior thyroid lobe region  ? Hypothyroidism   ? Left renal artery stenosis (HCC)   ? 40%  ? Lumbar stenosis with neurogenic claudication 2006  ? s/p decompression L2-L5  ? Myocardial infarction [facility]) ~ 2013; 05/06/2016  ? Osteoporosis 2009  ? diagnosed by bone density scan in 2009, on Vit D only, no bisphosphonate therapy  ? Phlebitis   ? LLE  ? PVD (peripheral vascular disease) ([place])   ? s/p left fem-pop bypass graft  ? TIA (transient ischemic attack)   ? carotid dopplers 5/13: no ICA stenosis  ? ?Past Surgical History:  ?Procedure Laterality Date  ? AMPUTATION FINGER Left 05/24/2017  ? Procedure: AMPUTATION tip of long FINGER;  Surgeon: Dayna Barker, MD;  Location: [place];  Service: Plastics;  Laterality: Left;  ? CARDIAC CATHETERIZATION N/A 05/06/2016  ? Procedure: Left Heart Cath and Coronary Angiography;  Surgeon: Lorretta Harp, MD;  Location: [place] [facility];  Service: Cardiovascular;  Laterality: N/A;  ? CARDIAC CATHETERIZATION N/A 05/06/2016  ? Procedure: Coronary Stent Intervention;  Surgeon: Lorretta Harp, MD;  Location: [place] [facility];  Service: Cardiovascular;  Laterality: N/A;  RCA  ? CATARACT EXTRACTION, BILATERAL Bilateral   ? CORONARY ANGIOPLASTY WITH STENT PLACEMENT  09/2008  ? CORONARY ANGIOPLASTY WITH STENT PLACEMENT  04/2001  ? ENDOSCOPIC HEMILAMINOTOMY W/ DISCECTOMY LUMBAR  06/2004  ? L3, L4 lami; L2, L5 hemilami; decompresion L2-3, 3-4, 4-  ? FEMORAL BYPASS  before 2003  ? LLE  ? I  & D EXTREMITY Left 05/24/2017  ? Procedure: IRRIGATION AND DEBRIDEMENT LEFT LONG FINGER;  Surgeon: Dayna Barker, MD;  Location: [place];  Service: Plastics;  Laterality: Left;  ? INCISION AND DRAINAGE ANTERIOR NECK  10/2001  ? absces sternoclavicular joint  ? ?Family History  ?Problem Relation Age of Onset  ? Stroke Mother   ? Stroke Father   ? ?Social History  ? ?Socioeconomic History  ? Marital status: Widowed  ?  Spouse name: Not on file  ? Number of children: Not on file  ? Years of education: Not on file  ? Highest education level: Not on file  ?Occupational History  ? Not on file  ?  Tobacco Use  ? Smoking status: Never  ? Smokeless tobacco: Never  ?Substance and Sexual Activity  ? Alcohol use: No  ? Drug use: No  ? Sexual activity: Never  ?Other Topics Concern  ? Not on file  ?Social History Narrative  ? Lives with daughter and granddaughter   ? PCP: Dr. Reinaldo Meeker in [place]  ?   ?   ? ?Social Determinants of Health  ? ?Financial Resource Strain: Low Risk   ? Difficulty of Paying Living Expenses: Not hard at all  ?Food Insecurity: Not on file  ?Transportation Needs: Not on file  ?Physical Activity: Not on file  ?Stress: Not on file  ?Social Connections: Not on file  ? ? ?Tobacco Counseling ?Counseling given: Not Answered ? ? ?Clinical Intake: ? ?  ? ?  ? ?  ? ?  ? ?  ? ?Diabetic?No ? ?  ? ?  ? ? ?Activities of Daily Living ? ?  12/05/2020  ?  3:02 PM  ?In your present state of health, do you have any difficulty performing the following activities:  ?Hearing? 0  ?Vision? 0  ?Difficulty concentrating or making decisions? 1  ?Walking or climbing stairs? 0  ?Dressing or bathing? 0  ?Doing errands, shopping? 1  ? ? ?Patient Care Team: ?Lillard Anes, MD as PCP - General (Family Medicine) ?Minus Breeding, MD as PCP - Cardiology (Cardiology) ?Lane Hacker, [facility] (Pharmacist) ? ?Indicate any recent Medical Services you may have received from other than [facility] providers in the past year (date may be  approximate). ? ?   ?Assessment:  ? This is a routine wellness examination for [facility]. ? ?Hearing/Vision screen ?No results found. ? ?Dietary issues and exercise activities discussed: ?  ? ? Goals Addressed   ?No

## 2021-10-03 DIAGNOSIS — H401133 Primary open-angle glaucoma, bilateral, severe stage: Secondary | ICD-10-CM | POA: Diagnosis not present

## 2021-10-17 ENCOUNTER — Ambulatory Visit (INDEPENDENT_AMBULATORY_CARE_PROVIDER_SITE_OTHER): Payer: Medicare Other | Admitting: Podiatry

## 2021-10-17 DIAGNOSIS — M79675 Pain in left toe(s): Secondary | ICD-10-CM | POA: Diagnosis not present

## 2021-10-17 DIAGNOSIS — B351 Tinea unguium: Secondary | ICD-10-CM | POA: Diagnosis not present

## 2021-10-17 DIAGNOSIS — M79674 Pain in right toe(s): Secondary | ICD-10-CM | POA: Diagnosis not present

## 2021-10-18 ENCOUNTER — Encounter: Payer: Self-pay | Admitting: Podiatry

## 2021-10-18 NOTE — Progress Notes (Signed)
  Subjective:  Patient ID: [name], female    DOB: August 07, 1925,  MRN: [id number]  Chief Complaint  Patient presents with   Nail Problem    Thick painful toenails, overdue follow up     88 y.o. female presents with the above complaint. History confirmed with patient.  She has not been seen in some time the nails are very thickened and elongated they have been unable to cut them they are causing pain in her shoes  Objective:  Physical Exam: warm, good capillary refill, no trophic changes or ulcerative lesions, normal DP and PT pulses, and normal sensory exam. Left Foot: dystrophic yellowed discolored nail plates with subungual debris Right Foot: dystrophic yellowed discolored nail plates with subungual debris  Assessment:   1. Pain due to onychomycosis of toenails of both feet      Plan:  Patient was evaluated and treated and all questions answered.  Discussed the etiology and treatment options for the condition in detail with the patient. Educated patient on the topical and oral treatment options for mycotic nails. Recommended debridement of the nails today. Sharp and mechanical debridement performed of all painful and mycotic nails today. Nails debrided in length and thickness using a nail nipper to level of comfort. Discussed treatment options including appropriate shoe gear. Follow up as needed for painful nails.    Return in about 3 months (around 01/17/2022) for painful thickened nails.

## 2021-11-12 DIAGNOSIS — M810 Age-related osteoporosis without current pathological fracture: Secondary | ICD-10-CM | POA: Diagnosis not present

## 2021-11-12 DIAGNOSIS — E210 Primary hyperparathyroidism: Secondary | ICD-10-CM | POA: Diagnosis not present

## 2021-11-13 ENCOUNTER — Encounter: Payer: Self-pay | Admitting: Legal Medicine

## 2021-11-13 ENCOUNTER — Ambulatory Visit (INDEPENDENT_AMBULATORY_CARE_PROVIDER_SITE_OTHER): Payer: Medicare Other | Admitting: Legal Medicine

## 2021-11-13 VITALS — BP 136/68 | HR 58 | Temp 98.0°F | Resp 14 | Ht 64.0 in | Wt 101.0 lb

## 2021-11-13 DIAGNOSIS — Z8673 Personal history of transient ischemic attack (TIA), and cerebral infarction without residual deficits: Secondary | ICD-10-CM | POA: Diagnosis not present

## 2021-11-13 DIAGNOSIS — E860 Dehydration: Secondary | ICD-10-CM | POA: Diagnosis not present

## 2021-11-13 DIAGNOSIS — G301 Alzheimer's disease with late onset: Secondary | ICD-10-CM | POA: Diagnosis not present

## 2021-11-13 DIAGNOSIS — Z681 Body mass index (BMI) 19 or less, adult: Secondary | ICD-10-CM | POA: Diagnosis not present

## 2021-11-13 DIAGNOSIS — F02B0 Dementia in other diseases classified elsewhere, moderate, without behavioral disturbance, psychotic disturbance, mood disturbance, and anxiety: Secondary | ICD-10-CM | POA: Diagnosis not present

## 2021-11-13 MED ORDER — TRIAMCINOLONE ACETONIDE 0.1 % EX CREA: 1.0000 | TOPICAL_CREAM | Freq: Two times a day (BID) | CUTANEOUS | 1 refills | Status: DC

## 2021-11-13 NOTE — Progress Notes (Signed)
Subjective:  Patient ID: [name], female    DOB: [birth year]  Age: 88 y.o. MRN: [id number]  Chief Complaint  Patient presents with   Dementia   Hypertension    HPI: patient is tired and saw endocrine yesterday, she was dehydrated, stopped simvastatin. 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  She is tired but is eating.  She needs home health and possible aide. Calcium was > 13 yesterday- I awake note and lab from [facility].   Current Outpatient Medications on File Prior to Visit  Medication Sig Dispense Refill   amLODipine (NORVASC) 5 MG tablet Take 1 tablet (5 mg total) by mouth daily. 90 tablet 1   Ascorbic Acid (VITAMIN C) 100 MG tablet Take by mouth daily.     aspirin EC 81 MG tablet Take 1 tablet (81 mg total) by mouth daily. Swallow whole. 90 tablet 3   b complex vitamins tablet Take 1 tablet by mouth daily.     cholecalciferol (VITAMIN D3) 25 MCG (1000 UNIT) tablet Take by mouth daily.     Coenzyme Q10 (COQ10) 50 MG CAPS Take 50 mg by mouth daily.     dorzolamide-timolol (COSOPT) 22.3-6.8 MG/ML ophthalmic solution 1 drop at bedtime.     ELDERBERRY PO Take by mouth daily.     famotidine (PEPCID) 40 MG tablet Take 1 tablet (40 mg total) by mouth daily. 90 tablet 2   MAGNESIUM GLUCONATE PO Take by mouth daily.     nitroGLYCERIN (NITROSTAT) 0.4 MG SL tablet Place 1 tablet (0.4 mg total) under the tongue every 5 (five) minutes as needed. For chest pain 25 tablet 2   ramipril (ALTACE) 10 MG capsule TAKE 1 CAPSULE BY MOUTH EVERY DAY 90 capsule 2   ticagrelor (BRILINTA) 90 MG TABS tablet TAKE 1 TABLET BY MOUTH TWICE A DAY 180 tablet 3   traMADol (ULTRAM) 50 MG tablet TAKE 2 TABLETS BY MOUTH EVERY DAY 60 tablet 3   metoprolol succinate (TOPROL-XL) 25 MG 24 hr tablet TAKE 3 TABLETS BY MOUTH DAILY 270 tablet 3   [DISCONTINUED] simvastatin (ZOCOR) 40 MG tablet Take 1 tablet (40 mg total) by mouth at bedtime. 30 tablet 0   No current facility-administered medications on file prior to  visit.   Past Medical History:  Diagnosis Date   Abscess 2003   I&D SCM abscess   Acute ischemic colitis ([place]) 04/10/11   acute episode of ischemic colitis, with abd pain, CT evidence of transverse colitis, and blood streaked BM.  Resolved with conservative treatment.   Arthritis    "hands, joints, knees" (07/19/2016)   Bacteremia 2003   hx of staph bacteremia   CAD (coronary artery disease) 2003, 2010   s/p stenting of RCA x2, s/p stent to OM;  LHC 04/2009 in the setting of a NSTEMI: EF 65%, RCA stent patent with 60% ISR proximal and 40% ISR mid, PLV proximal 50-60% and distal 50%, ostial circumflex 30-40%, proximal OM mild in-stent restenosis with a branch with 90+ percent ostial stenosis, proximal LAD 30-40%.  FFR of ostial circumflex:  0.96 - not flow limiting.  Medical therapy was continued.   Chronic lower back pain    "not a big problem" (07/19/2016)   Closed displaced fracture of distal phalanx of left middle finger 05/23/2017   Degenerative disc disease    GERD (gastroesophageal reflux disease)    History of medication noncompliance    concerning Plavix   HTN (hypertension)    Echocardiogram 08/26/11: Mild focal basal  septal hypertrophy, EF 06-26%, grade 2 diastolic dysfunction, mild MR.  There was a fluid-filled area likely in the location of the liver noted   Hyperlipidemia    Hyperparathyroidism 2003   parathyroid adenoma localized to the right inferior thyroid lobe region   Hypothyroidism    Left renal artery stenosis (HCC)    40%   Lumbar stenosis with neurogenic claudication 2006   s/p decompression L2-L5   Myocardial infarction ([place]) ~ 2013; 05/06/2016   Osteoporosis 2009   diagnosed by bone density scan in 2009, on Vit D only, no bisphosphonate therapy   Phlebitis    LLE   PVD (peripheral vascular disease) (HCC)    s/p left fem-pop bypass graft   TIA (transient ischemic attack)    carotid dopplers 5/13: no ICA stenosis   Past Surgical History:  Procedure Laterality  Date   AMPUTATION FINGER Left 05/24/2017   Procedure: AMPUTATION tip of long FINGER;  Surgeon: Dayna Barker, MD;  Location: [facility];  Service: Plastics;  Laterality: Left;   CARDIAC CATHETERIZATION N/A 05/06/2016   Procedure: Left Heart Cath and Coronary Angiography;  Surgeon: Lorretta Harp, MD;  Location: [facility];  Service: Cardiovascular;  Laterality: N/A;   CARDIAC CATHETERIZATION N/A 05/06/2016   Procedure: Coronary Stent Intervention;  Surgeon: Lorretta Harp, MD;  Location: [facility];  Service: Cardiovascular;  Laterality: N/A;  RCA   CATARACT EXTRACTION, BILATERAL Bilateral    CORONARY ANGIOPLASTY WITH STENT PLACEMENT  09/2008   CORONARY ANGIOPLASTY WITH STENT PLACEMENT  04/2001   ENDOSCOPIC HEMILAMINOTOMY W/ DISCECTOMY LUMBAR  06/2004   L3, L4 lami; L2, L5 hemilami; decompresion L2-3, 3-4, 4-   FEMORAL BYPASS  before 2003   LLE   I & D EXTREMITY Left 05/24/2017   Procedure: IRRIGATION AND DEBRIDEMENT LEFT LONG FINGER;  Surgeon: Dayna Barker, MD;  Location: [facility];  Service: Plastics;  Laterality: Left;   INCISION AND DRAINAGE ANTERIOR NECK  10/2001   absces sternoclavicular joint    Family History  Problem Relation Age of Onset   Stroke Mother    Stroke Father    Social History   Socioeconomic History   Marital status: Widowed    Spouse name: Not on file   Number of children: Not on file   Years of education: Not on file   Highest education level: Not on file  Occupational History   Not on file  Tobacco Use   Smoking status: Never   Smokeless tobacco: Never  Vaping Use   Vaping Use: Never used  Substance and Sexual Activity   Alcohol use: No   Drug use: No   Sexual activity: Never  Other Topics Concern   Not on file  Social History Narrative   Lives with daughter and granddaughter    PCP: Dr. Reinaldo Meeker in [place] Determinants of Health   Financial Resource Strain: Low Risk  (07/04/2021)   Overall Financial Resource  Strain (CARDIA)    Difficulty of Paying Living Expenses: Not hard at all  Food Insecurity: No Food Insecurity (08/22/2021)   Hunger Vital Sign    Worried About Running Out of Food in the Last Year: Never true    [place] in the Last Year: Never true  Transportation Needs: No Transportation Needs (08/22/2021)   PRAPARE - Hydrologist (Medical): No    Lack of Transportation (Non-Medical): No  Physical Activity:  Insufficiently Active (08/22/2021)   Exercise Vital Sign    Days of Exercise per Week: 2 days    Minutes of Exercise per Session: 20 min  Stress: No Stress Concern Present (08/22/2021)   [place]    Feeling of Stress : Not at all  Social Connections: Moderately Isolated (08/22/2021)   Social Connection and Isolation Panel [NHANES]    Frequency of Communication with Friends and Family: More than three times a week    Frequency of Social Gatherings with Friends and Family: More than three times a week    Attends Religious Services: More than 4 times per year    Active Member of [employer] or Organizations: No    Attends Archivist Meetings: Never    Marital Status: Widowed    Review of Systems  Constitutional:  Positive for fatigue. Negative for chills and fever.  HENT:  Negative for congestion, ear pain and sore throat.   Eyes:  Negative for visual disturbance.  Respiratory:  Negative for cough and shortness of breath.   Cardiovascular:  Negative for chest pain and palpitations.  Gastrointestinal:  Negative for abdominal pain, constipation, diarrhea, nausea and vomiting.  Endocrine: Negative for polydipsia, polyphagia and polyuria.  Genitourinary:  Negative for difficulty urinating and dysuria.  Musculoskeletal:  Negative for arthralgias, back pain and myalgias.  Skin:  Negative for rash.  Neurological:  Negative for dizziness, light-headedness and headaches.   Psychiatric/Behavioral:  Negative for dysphoric mood. The patient is not nervous/anxious.      Objective:  BP 136/68   Pulse (!) 58   Temp 98 F (36.7 C)   Resp 14   Ht '5\' 4"'$  (1.626 m)   Wt 101 lb (45.8 kg)   SpO2 97%   BMI 17.34 kg/m      11/13/2021   11:43 AM 08/03/2021   11:11 AM 08/01/2021    8:12 PM  BP/Weight  Systolic BP 478 295 621  Diastolic BP 68 84 80  Wt. (Lbs) 101 98   BMI 17.34 kg/m2 16.82 kg/m2     Physical Exam Vitals reviewed.  Constitutional:      General: She is not in acute distress.    Appearance: Normal appearance.  HENT:     Head: Normocephalic.     Right Ear: Tympanic membrane normal.     Left Ear: Tympanic membrane normal.     Nose: Nose normal.     Mouth/Throat:     Mouth: Mucous membranes are moist.     Pharynx: Oropharynx is clear.  Eyes:     Extraocular Movements: Extraocular movements intact.     Conjunctiva/sclera: Conjunctivae normal.     Pupils: Pupils are equal, round, and reactive to light.  Cardiovascular:     Rate and Rhythm: Normal rate and regular rhythm.     Pulses: Normal pulses.     Heart sounds: Normal heart sounds. No murmur heard.    No gallop.  Pulmonary:     Effort: Pulmonary effort is normal. No respiratory distress.     Breath sounds: Normal breath sounds. No wheezing.  Abdominal:     General: Abdomen is flat. Bowel sounds are normal. There is no distension.     Palpations: Abdomen is soft.     Tenderness: There is no abdominal tenderness.  Musculoskeletal:        General: Normal range of motion.     Cervical back: Normal range of motion and neck supple.  Skin:  General: Skin is warm.     Capillary Refill: Capillary refill takes less than 2 seconds.  Neurological:     General: No focal deficit present.     Mental Status: She is alert and oriented to person, place, and time. Mental status is at baseline.     Gait: Gait normal.     Deep Tendon Reflexes: Reflexes normal.  Psychiatric:        Mood and  Affect: Mood normal.        Lab Results  Component Value Date   WBC 7.8 08/03/2021   HGB 11.4 08/03/2021   HCT 35.4 08/03/2021   PLT 176 08/03/2021   GLUCOSE 100 (H) 08/03/2021   CHOL 290 (H) 08/03/2021   TRIG 162 (H) 08/03/2021   HDL 63 08/03/2021   LDLCALC 197 (H) 08/03/2021   ALT 11 08/03/2021   AST 19 08/03/2021   NA 142 08/03/2021   K 5.4 (H) 08/03/2021   CL 110 (H) 08/03/2021   CREATININE 1.50 (H) 08/03/2021   BUN 24 08/03/2021   CO2 19 (L) 08/03/2021   TSH 0.495 05/04/2020   INR 1.09 06/25/2016   HGBA1C 5.9 (H) 08/26/2011      Assessment & Plan:   Problem List Items Addressed This Visit       Other   History of TIA (transient ischemic attack) (Chronic) No further TIAs, she is awake and alert.    Adult BMI <19 kg/sq m - Primary Low body weight    Dehydration Patient not drinking fluids well in this 90+ temperature.  She saw endocrine who felt she was dehydrated and had high calcium.  We will set up for 1 Liter IV fluids with outpatient at [facility].  .       Follow-up: Return in about 2 weeks (around 11/27/2021).  An After Visit Summary was printed and given to the patient.  Reinaldo Meeker, MD [name] [phone]

## 2021-11-13 NOTE — Addendum Note (Signed)
Addended by: Thompson Caul I on: 11/13/2021 04:45 PM   Modules accepted: Orders

## 2021-11-14 ENCOUNTER — Ambulatory Visit: Payer: Medicare Other | Admitting: Legal Medicine

## 2021-11-14 DIAGNOSIS — E860 Dehydration: Secondary | ICD-10-CM | POA: Diagnosis not present

## 2021-11-14 LAB — COMPREHENSIVE METABOLIC PANEL: Calcium: 11.6 — AB (ref 8.7–10.7)

## 2021-11-16 ENCOUNTER — Encounter: Payer: Self-pay | Admitting: Legal Medicine

## 2021-11-20 DIAGNOSIS — E210 Primary hyperparathyroidism: Secondary | ICD-10-CM | POA: Diagnosis not present

## 2021-11-28 ENCOUNTER — Ambulatory Visit (INDEPENDENT_AMBULATORY_CARE_PROVIDER_SITE_OTHER): Payer: Medicare Other | Admitting: Legal Medicine

## 2021-11-28 ENCOUNTER — Encounter: Payer: Self-pay | Admitting: Legal Medicine

## 2021-11-28 VITALS — BP 130/64 | HR 70 | Temp 97.8°F | Resp 15 | Ht 62.0 in | Wt 103.0 lb

## 2021-11-28 DIAGNOSIS — G301 Alzheimer's disease with late onset: Secondary | ICD-10-CM

## 2021-11-28 DIAGNOSIS — Z681 Body mass index (BMI) 19 or less, adult: Secondary | ICD-10-CM

## 2021-11-28 DIAGNOSIS — E860 Dehydration: Secondary | ICD-10-CM

## 2021-11-28 DIAGNOSIS — F02B0 Dementia in other diseases classified elsewhere, moderate, without behavioral disturbance, psychotic disturbance, mood disturbance, and anxiety: Secondary | ICD-10-CM

## 2021-11-28 LAB — COMPREHENSIVE METABOLIC PANEL
ALT: 11 IU/L (ref 0–32)
AST: 20 IU/L (ref 0–40)
Albumin/Globulin Ratio: 1.5 (ref 1.2–2.2)
Albumin: 4.3 g/dL (ref 3.6–4.6)
Alkaline Phosphatase: 196 IU/L — ABNORMAL HIGH (ref 44–121)
BUN/Creatinine Ratio: 11 — ABNORMAL LOW (ref 12–28)
BUN: 19 mg/dL (ref 10–36)
Bilirubin Total: <0.2 mg/dL (ref 0.0–1.2)
CO2: 21 mmol/L (ref 20–29)
Calcium: 12 mg/dL — ABNORMAL HIGH (ref 8.7–10.3)
Chloride: 110 mmol/L — ABNORMAL HIGH (ref 96–106)
Creatinine, Ser: 1.8 mg/dL — ABNORMAL HIGH (ref 0.57–1.00)
Globulin, Total: 2.9 g/dL (ref 1.5–4.5)
Glucose: 94 mg/dL (ref 70–99)
Potassium: 6.1 mmol/L (ref 3.5–5.2)
Sodium: 141 mmol/L (ref 134–144)
Total Protein: 7.2 g/dL (ref 6.0–8.5)
eGFR: 26 mL/min/{1.73_m2} — ABNORMAL LOW (ref >59)

## 2021-11-28 NOTE — Progress Notes (Signed)
Subjective:  Patient ID: [name], female    DOB: [birth year]  Age: 88 y.o. MRN: [id number]  Chief Complaint  Patient presents with   Dementia   Dehydration    HPI: follow up  Patient was seen on 11/13/2021 for dementia and dehydration. She had IV fluids and recheck Calcium. She is feeling better, eating better  and they are pushing fluids. She has improved greatly, no further falls, She is happy.  Stronger and doing home exercises.  Current Outpatient Medications on File Prior to Visit  Medication Sig Dispense Refill   amLODipine (NORVASC) 5 MG tablet Take 1 tablet (5 mg total) by mouth daily. 90 tablet 1   Ascorbic Acid (VITAMIN C) 100 MG tablet Take by mouth daily.     aspirin EC 81 MG tablet Take 1 tablet (81 mg total) by mouth daily. Swallow whole. 90 tablet 3   b complex vitamins tablet Take 1 tablet by mouth daily.     brimonidine (ALPHAGAN P) 0.1 % SOLN 1 drop into affected eye     cholecalciferol (VITAMIN D3) 25 MCG (1000 UNIT) tablet Take by mouth daily.     Coenzyme Q10 (COQ10) 50 MG CAPS Take 50 mg by mouth daily.     dorzolamide-timolol (COSOPT) 22.3-6.8 MG/ML ophthalmic solution 1 drop at bedtime.     ELDERBERRY PO Take by mouth daily.     famotidine (PEPCID) 40 MG tablet Take 1 tablet (40 mg total) by mouth daily. 90 tablet 2   MAGNESIUM GLUCONATE PO Take by mouth daily.     metoprolol succinate (TOPROL-XL) 25 MG 24 hr tablet TAKE 3 TABLETS BY MOUTH DAILY 270 tablet 3   nitroGLYCERIN (NITROSTAT) 0.4 MG SL tablet Place 1 tablet (0.4 mg total) under the tongue every 5 (five) minutes as needed. For chest pain 25 tablet 2   ramipril (ALTACE) 10 MG capsule TAKE 1 CAPSULE BY MOUTH EVERY DAY 90 capsule 2   ticagrelor (BRILINTA) 90 MG TABS tablet TAKE 1 TABLET BY MOUTH TWICE A DAY 180 tablet 3   traMADol (ULTRAM) 50 MG tablet TAKE 2 TABLETS BY MOUTH EVERY DAY 60 tablet 3   triamcinolone cream (KENALOG) 0.1 % Apply 1 Application topically 2 (two) times daily. Both hands 45  g 1   [DISCONTINUED] simvastatin (ZOCOR) 40 MG tablet Take 1 tablet (40 mg total) by mouth at bedtime. 30 tablet 0   No current facility-administered medications on file prior to visit.   Past Medical History:  Diagnosis Date   Abscess 2003   I&D SCM abscess   Acute ischemic colitis ([place]) 04/10/11   acute episode of ischemic colitis, with abd pain, CT evidence of transverse colitis, and blood streaked BM.  Resolved with conservative treatment.   Arthritis    "hands, joints, knees" (07/19/2016)   Bacteremia 2003   hx of staph bacteremia   CAD (coronary artery disease) 2003, 2010   s/p stenting of RCA x2, s/p stent to OM;  LHC 04/2009 in the setting of a NSTEMI: EF 65%, RCA stent patent with 60% ISR proximal and 40% ISR mid, PLV proximal 50-60% and distal 50%, ostial circumflex 30-40%, proximal OM mild in-stent restenosis with a branch with 90+ percent ostial stenosis, proximal LAD 30-40%.  FFR of ostial circumflex:  0.96 - not flow limiting.  Medical therapy was continued.   Chronic lower back pain    "not a big problem" (07/19/2016)   Closed displaced fracture of distal phalanx of left middle finger 05/23/2017  Degenerative disc disease    GERD (gastroesophageal reflux disease)    History of medication noncompliance    concerning Plavix   HTN (hypertension)    Echocardiogram 08/26/11: Mild focal basal septal hypertrophy, EF 95-09%, grade 2 diastolic dysfunction, mild MR.  There was a fluid-filled area likely in the location of the liver noted   Hyperlipidemia    Hyperparathyroidism 2003   parathyroid adenoma localized to the right inferior thyroid lobe region   Hypothyroidism    Left renal artery stenosis (HCC)    40%   Lumbar stenosis with neurogenic claudication 2006   s/p decompression L2-L5   Myocardial infarction ([place]) ~ 2013; 05/06/2016   Osteoporosis 2009   diagnosed by bone density scan in 2009, on Vit D only, no bisphosphonate therapy   Phlebitis    LLE   PVD (peripheral  vascular disease) (HCC)    s/p left fem-pop bypass graft   TIA (transient ischemic attack)    carotid dopplers 5/13: no ICA stenosis   Past Surgical History:  Procedure Laterality Date   AMPUTATION FINGER Left 05/24/2017   Procedure: AMPUTATION tip of long FINGER;  Surgeon: Dayna Barker, MD;  Location: [facility];  Service: Plastics;  Laterality: Left;   CARDIAC CATHETERIZATION N/A 05/06/2016   Procedure: Left Heart Cath and Coronary Angiography;  Surgeon: Lorretta Harp, MD;  Location: [facility];  Service: Cardiovascular;  Laterality: N/A;   CARDIAC CATHETERIZATION N/A 05/06/2016   Procedure: Coronary Stent Intervention;  Surgeon: Lorretta Harp, MD;  Location: [facility];  Service: Cardiovascular;  Laterality: N/A;  RCA   CATARACT EXTRACTION, BILATERAL Bilateral    CORONARY ANGIOPLASTY WITH STENT PLACEMENT  09/2008   CORONARY ANGIOPLASTY WITH STENT PLACEMENT  04/2001   ENDOSCOPIC HEMILAMINOTOMY W/ DISCECTOMY LUMBAR  06/2004   L3, L4 lami; L2, L5 hemilami; decompresion L2-3, 3-4, 4-   FEMORAL BYPASS  before 2003   LLE   I & D EXTREMITY Left 05/24/2017   Procedure: IRRIGATION AND DEBRIDEMENT LEFT LONG FINGER;  Surgeon: Dayna Barker, MD;  Location: [facility];  Service: Plastics;  Laterality: Left;   INCISION AND DRAINAGE ANTERIOR NECK  10/2001   absces sternoclavicular joint    Family History  Problem Relation Age of Onset   Stroke Mother    Stroke Father    Social History   Socioeconomic History   Marital status: Widowed    Spouse name: Not on file   Number of children: Not on file   Years of education: Not on file   Highest education level: Not on file  Occupational History   Not on file  Tobacco Use   Smoking status: Never   Smokeless tobacco: Never  Vaping Use   Vaping Use: Never used  Substance and Sexual Activity   Alcohol use: No   Drug use: No   Sexual activity: Never  Other Topics Concern   Not on file  Social History Narrative   Lives with daughter  and granddaughter    PCP: Dr. Reinaldo Meeker in [place] Determinants of Health   Financial Resource Strain: Low Risk  (07/04/2021)   Overall Financial Resource Strain (CARDIA)    Difficulty of Paying Living Expenses: Not hard at all  Food Insecurity: No Food Insecurity (08/22/2021)   Hunger Vital Sign    Worried About Running Out of Food in the Last Year: Never true    [place] in the Last  Year: Never true  Transportation Needs: No Transportation Needs (08/22/2021)   PRAPARE - Hydrologist (Medical): No    Lack of Transportation (Non-Medical): No  Physical Activity: Insufficiently Active (08/22/2021)   Exercise Vital Sign    Days of Exercise per Week: 2 days    Minutes of Exercise per Session: 20 min  Stress: No Stress Concern Present (08/22/2021)   [place]    Feeling of Stress : Not at all  Social Connections: Moderately Isolated (08/22/2021)   Social Connection and Isolation Panel [NHANES]    Frequency of Communication with Friends and Family: More than three times a week    Frequency of Social Gatherings with Friends and Family: More than three times a week    Attends Religious Services: More than 4 times per year    Active Member of [employer] or Organizations: No    Attends Archivist Meetings: Never    Marital Status: Widowed    Review of Systems  Constitutional: Negative.  Negative for activity change and appetite change.  HENT:  Negative for congestion and dental problem.   Eyes:  Negative for visual disturbance.  Respiratory:  Negative for chest tightness and shortness of breath.   Cardiovascular:  Negative for chest pain, palpitations and leg swelling.  Gastrointestinal:  Negative for abdominal distention and abdominal pain.  Genitourinary: Negative.  Negative for difficulty urinating.  Musculoskeletal:  Negative for arthralgias and back pain.   Skin: Negative.   Neurological: Negative.   Psychiatric/Behavioral: Negative.       Objective:  BP 130/64   Pulse 70   Temp 97.8 F (36.6 C)   Resp 15   Ht '5\' 2"'$  (1.575 m)   Wt 103 lb (46.7 kg)   BMI 18.84 kg/m      11/28/2021    1:45 PM 11/13/2021   11:43 AM 08/03/2021   11:11 AM  BP/Weight  Systolic BP 366 440 347  Diastolic BP 64 68 84  Wt. (Lbs) 103 101 98  BMI 18.84 kg/m2 17.34 kg/m2 16.82 kg/m2    Physical Exam Vitals reviewed.  Constitutional:      Appearance: Normal appearance.  HENT:     Head: Normocephalic.     Right Ear: Tympanic membrane normal.     Left Ear: Tympanic membrane normal.     Nose: Nose normal.  Eyes:     Extraocular Movements: Extraocular movements intact.     Conjunctiva/sclera: Conjunctivae normal.     Pupils: Pupils are equal, round, and reactive to light.  Cardiovascular:     Rate and Rhythm: Normal rate and regular rhythm.     Pulses: Normal pulses.     Heart sounds: Normal heart sounds. No murmur heard.    No gallop.  Pulmonary:     Effort: Pulmonary effort is normal. No respiratory distress.     Breath sounds: Normal breath sounds. No wheezing.  Abdominal:     General: Abdomen is flat. Bowel sounds are normal. There is no distension.     Tenderness: There is no abdominal tenderness.  Musculoskeletal:        General: Normal range of motion.     Cervical back: Normal range of motion and neck supple.     Right lower leg: No edema.     Left lower leg: No edema.  Skin:    General: Skin is warm.     Capillary Refill: Capillary refill takes less than  2 seconds.  Neurological:     General: No focal deficit present.     Mental Status: She is alert and oriented to person, place, and time.     Motor: Weakness present.     Comments: Weakness improved, no orthostatic hypotension  Psychiatric:        Mood and Affect: Mood normal.        Thought Content: Thought content normal.         Lab Results  Component Value Date   WBC  7.8 08/03/2021   HGB 11.4 08/03/2021   HCT 35.4 08/03/2021   PLT 176 08/03/2021   GLUCOSE 94 11/28/2021   CHOL 290 (H) 08/03/2021   TRIG 162 (H) 08/03/2021   HDL 63 08/03/2021   LDLCALC 197 (H) 08/03/2021   ALT 11 11/28/2021   AST 20 11/28/2021   NA 141 11/28/2021   K 6.1 (HH) 11/28/2021   CL 110 (H) 11/28/2021   CREATININE 1.80 (H) 11/28/2021   BUN 19 11/28/2021   CO2 21 11/28/2021   TSH 0.495 05/04/2020   INR 1.09 06/25/2016   HGBA1C 5.9 (H) 08/26/2011      Assessment & Plan:   Problem List Items Addressed This Visit       Other   Hypercalcemia   Relevant Orders   Comprehensive metabolic panel (Completed) Recheck calcium level    Adult BMI <19 kg/sq m Patient is eating better and taking protein/calorie supplements    Dehydration - Primary Resolved with IV fluids and encouraging PO fluids   Other Visit Diagnoses     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    Orders Placed This Encounter  Procedures   Comprehensive metabolic panel     Follow-up: Return in about 3 months (around 02/28/2022).  An After Visit Summary was printed and given to the patient.  Reinaldo Meeker, MD [name] [phone]

## 2021-11-29 ENCOUNTER — Other Ambulatory Visit: Payer: Self-pay | Admitting: Legal Medicine

## 2021-11-29 ENCOUNTER — Other Ambulatory Visit: Payer: Self-pay

## 2021-11-29 DIAGNOSIS — E875 Hyperkalemia: Secondary | ICD-10-CM

## 2021-11-29 MED ORDER — SODIUM POLYSTYRENE SULFONATE PO POWD: Freq: Once | ORAL | 2 refills | Status: DC

## 2021-11-29 NOTE — Progress Notes (Signed)
Kidney tests remain stage 4, potassium 6.1, calcium 12.0, I called in kayexalate, recheck potassium one week

## 2021-12-06 ENCOUNTER — Other Ambulatory Visit: Payer: Medicare Other

## 2021-12-07 ENCOUNTER — Other Ambulatory Visit: Payer: Medicare Other

## 2021-12-08 LAB — COMPREHENSIVE METABOLIC PANEL
ALT: 9 IU/L (ref 0–32)
AST: 20 IU/L (ref 0–40)
Albumin/Globulin Ratio: 1.4 (ref 1.2–2.2)
Albumin: 4.3 g/dL (ref 3.6–4.6)
Alkaline Phosphatase: 199 IU/L — ABNORMAL HIGH (ref 44–121)
BUN/Creatinine Ratio: 12 (ref 12–28)
BUN: 21 mg/dL (ref 10–36)
Bilirubin Total: 0.2 mg/dL (ref 0.0–1.2)
CO2: 20 mmol/L (ref 20–29)
Calcium: 10.1 mg/dL (ref 8.7–10.3)
Chloride: 107 mmol/L — ABNORMAL HIGH (ref 96–106)
Creatinine, Ser: 1.79 mg/dL — ABNORMAL HIGH (ref 0.57–1.00)
Globulin, Total: 3 g/dL (ref 1.5–4.5)
Glucose: 99 mg/dL (ref 70–99)
Potassium: 4.7 mmol/L (ref 3.5–5.2)
Sodium: 143 mmol/L (ref 134–144)
Total Protein: 7.3 g/dL (ref 6.0–8.5)
eGFR: 26 mL/min/{1.73_m2} — ABNORMAL LOW (ref >59)

## 2021-12-10 DIAGNOSIS — N390 Urinary tract infection, site not specified: Secondary | ICD-10-CM | POA: Diagnosis not present

## 2021-12-10 DIAGNOSIS — D631 Anemia in chronic kidney disease: Secondary | ICD-10-CM | POA: Diagnosis not present

## 2021-12-10 DIAGNOSIS — I129 Hypertensive chronic kidney disease with stage 1 through stage 4 chronic kidney disease, or unspecified chronic kidney disease: Secondary | ICD-10-CM | POA: Diagnosis not present

## 2021-12-10 DIAGNOSIS — N184 Chronic kidney disease, stage 4 (severe): Secondary | ICD-10-CM | POA: Diagnosis not present

## 2021-12-10 DIAGNOSIS — E213 Hyperparathyroidism, unspecified: Secondary | ICD-10-CM | POA: Diagnosis not present

## 2021-12-10 DIAGNOSIS — N1832 Chronic kidney disease, stage 3b: Secondary | ICD-10-CM | POA: Diagnosis not present

## 2022-01-10 ENCOUNTER — Other Ambulatory Visit: Payer: Self-pay | Admitting: Legal Medicine

## 2022-01-21 ENCOUNTER — Ambulatory Visit (INDEPENDENT_AMBULATORY_CARE_PROVIDER_SITE_OTHER): Payer: Medicare Other | Admitting: Podiatry

## 2022-01-21 ENCOUNTER — Encounter: Payer: Self-pay | Admitting: Podiatry

## 2022-01-21 DIAGNOSIS — B351 Tinea unguium: Secondary | ICD-10-CM

## 2022-01-21 DIAGNOSIS — I739 Peripheral vascular disease, unspecified: Secondary | ICD-10-CM

## 2022-01-21 DIAGNOSIS — M79675 Pain in left toe(s): Secondary | ICD-10-CM

## 2022-01-21 DIAGNOSIS — M79674 Pain in right toe(s): Secondary | ICD-10-CM

## 2022-01-27 NOTE — Progress Notes (Signed)
Subjective:  Patient ID: [name], female    DOB: April 25, 1925,  MRN: [id number]  [name] presents to clinic today for:  Chief Complaint  Patient presents with   Nail Problem    Routine foot care PCP-Edward Henrene Pastor PCP VST-11/2021   Past Surgical History:  Procedure Laterality Date   AMPUTATION FINGER Left 05/24/2017   Procedure: AMPUTATION tip of long FINGER;  Surgeon: Dayna Barker, MD;  Location: [place];  Service: Plastics;  Laterality: Left;   CARDIAC CATHETERIZATION N/A 05/06/2016   Procedure: Left Heart Cath and Coronary Angiography;  Surgeon: Lorretta Harp, MD;  Location: [facility];  Service: Cardiovascular;  Laterality: N/A;   CARDIAC CATHETERIZATION N/A 05/06/2016   Procedure: Coronary Stent Intervention;  Surgeon: Lorretta Harp, MD;  Location: [facility];  Service: Cardiovascular;  Laterality: N/A;  RCA   CATARACT EXTRACTION, BILATERAL Bilateral    CORONARY ANGIOPLASTY WITH STENT PLACEMENT  09/2008   CORONARY ANGIOPLASTY WITH STENT PLACEMENT  04/2001   ENDOSCOPIC HEMILAMINOTOMY W/ DISCECTOMY LUMBAR  06/2004   L3, L4 lami; L2, L5 hemilami; decompresion L2-3, 3-4, 4-   FEMORAL BYPASS  before 2003   LLE   I & D EXTREMITY Left 05/24/2017   Procedure: IRRIGATION AND DEBRIDEMENT LEFT LONG FINGER;  Surgeon: Dayna Barker, MD;  Location: [place];  Service: Plastics;  Laterality: Left;   INCISION AND DRAINAGE ANTERIOR NECK  10/2001   absces sternoclavicular joint    New problem(s): None.   She is accompanied by her granddaughter on today's visit.  She has documented h/o PAD with h/o left fem-pop bypass prior to 2003.  PCP is Lillard Anes, MD , and last visit was  November 28, 2021.  Allergies  Allergen Reactions   Diphenhydramine Hcl Other (See Comments)    unknown   Penicillins Other (See Comments)    unknown   Shellfish Allergy Other (See Comments)    unknown   Review of Systems: Negative except as noted in the HPI.  Objective: No  changes noted in today's physical examination.  [name] is a pleasant 88 y.o. female in NAD. AAO x 3.  Vascular Examination: CFT <3 seconds b/l. Faintly palpable DP pulses b/l LE. Diminished PT pulse(s) b/l LE.. Skin temperature gradient warm to warm b/l. No pain with calf compression. No ischemia or gangrene. No cyanosis or clubbing noted b/l. Pedal hair absent. No edema noted b/l LE.   Neurological Examination: Sensation grossly intact b/l with 10 gram monofilament. Vibratory sensation intact b/l.   Dermatological Examination: Pedal skin warm and supple b/l. Toenails 1-5 b/l thick, discolored, elongated with subungual debris and pain on dorsal palpation.  No open wounds b/l LE. No interdigital macerations noted b/l LE.  Musculoskeletal Examination: Muscle strength 5/5 to b/l LE. No pain, crepitus or joint limitation noted with ROM bilateral LE. HAV with bunion deformity noted b/l LE.  Radiographs: None  Assessment/Plan: 1. Pain due to onychomycosis of toenails of both feet   2. Peripheral vascular disease ([place])     No orders of the defined types were placed in this encounter.   -Patient's family member present. All questions/concerns addressed on today's visit. -Continue diabetic foot care principles: inspect feet daily, monitor glucose as recommended by PCP and/or Endocrinologist, and follow prescribed diet per PCP, Endocrinologist and/or dietician. -Patient to continue soft, supportive shoe gear daily. -Toenails 1-5 b/l were debrided in length and girth with sterile nail nippers and dremel without iatrogenic bleeding.  -  Patient/POA to call should there be question/concern in the interim.   Return in about 3 months (around 04/23/2022).  Marzetta Board, DPM

## 2022-01-29 ENCOUNTER — Ambulatory Visit: Payer: Medicare Other | Admitting: Legal Medicine

## 2022-02-14 DIAGNOSIS — M810 Age-related osteoporosis without current pathological fracture: Secondary | ICD-10-CM | POA: Diagnosis not present

## 2022-02-14 DIAGNOSIS — E210 Primary hyperparathyroidism: Secondary | ICD-10-CM | POA: Diagnosis not present

## 2022-02-14 DIAGNOSIS — N289 Disorder of kidney and ureter, unspecified: Secondary | ICD-10-CM | POA: Diagnosis not present

## 2022-03-09 ENCOUNTER — Other Ambulatory Visit: Payer: Self-pay | Admitting: Cardiology

## 2022-03-11 DIAGNOSIS — N184 Chronic kidney disease, stage 4 (severe): Secondary | ICD-10-CM | POA: Diagnosis not present

## 2022-03-11 DIAGNOSIS — D631 Anemia in chronic kidney disease: Secondary | ICD-10-CM | POA: Diagnosis not present

## 2022-03-11 DIAGNOSIS — I129 Hypertensive chronic kidney disease with stage 1 through stage 4 chronic kidney disease, or unspecified chronic kidney disease: Secondary | ICD-10-CM | POA: Diagnosis not present

## 2022-03-11 DIAGNOSIS — E213 Hyperparathyroidism, unspecified: Secondary | ICD-10-CM | POA: Diagnosis not present

## 2022-03-27 ENCOUNTER — Ambulatory Visit (INDEPENDENT_AMBULATORY_CARE_PROVIDER_SITE_OTHER): Payer: Medicare Other | Admitting: Legal Medicine

## 2022-03-27 ENCOUNTER — Encounter: Payer: Self-pay | Admitting: Legal Medicine

## 2022-03-27 VITALS — BP 122/60 | HR 76 | Temp 98.3°F | Resp 14 | Ht 62.0 in | Wt 95.8 lb

## 2022-03-27 DIAGNOSIS — G309 Alzheimer's disease, unspecified: Secondary | ICD-10-CM

## 2022-03-27 DIAGNOSIS — M79675 Pain in left toe(s): Secondary | ICD-10-CM

## 2022-03-27 DIAGNOSIS — E782 Mixed hyperlipidemia: Secondary | ICD-10-CM | POA: Diagnosis not present

## 2022-03-27 DIAGNOSIS — I110 Hypertensive heart disease with heart failure: Secondary | ICD-10-CM

## 2022-03-27 DIAGNOSIS — E211 Secondary hyperparathyroidism, not elsewhere classified: Secondary | ICD-10-CM

## 2022-03-27 DIAGNOSIS — M79674 Pain in right toe(s): Secondary | ICD-10-CM | POA: Diagnosis not present

## 2022-03-27 DIAGNOSIS — K219 Gastro-esophageal reflux disease without esophagitis: Secondary | ICD-10-CM

## 2022-03-27 DIAGNOSIS — F0280 Dementia in other diseases classified elsewhere without behavioral disturbance: Secondary | ICD-10-CM

## 2022-03-27 DIAGNOSIS — I2510 Atherosclerotic heart disease of native coronary artery without angina pectoris: Secondary | ICD-10-CM

## 2022-03-27 DIAGNOSIS — R451 Restlessness and agitation: Secondary | ICD-10-CM

## 2022-03-27 DIAGNOSIS — B351 Tinea unguium: Secondary | ICD-10-CM

## 2022-03-27 MED ORDER — MIRTAZAPINE 7.5 MG PO TABS: 7.5000 mg | ORAL_TABLET | Freq: Every day | ORAL | 5 refills | Status: DC

## 2022-03-27 MED ORDER — TRAMADOL HCL 50 MG PO TABS: 100.0000 mg | ORAL_TABLET | Freq: Every day | ORAL | 3 refills | Status: DC

## 2022-03-27 NOTE — Progress Notes (Signed)
Subjective:  Patient ID: [name], female    DOB: 13-Sep-1925  Age: 88 y.o. MRN: [id number]  Chief Complaint  Patient presents with   Dementia    HPI   Patient's daughter is concerned about patient to be more confused, anxious in the past month.  Patient presents for follow up of hypertension.  Patient tolerating amlodipine, metoprolol, atace well without side effects.  Patient was diagnosed with hypertension 2010 so has been treated for hypertension for 10 years.Patient is working on maintaining diet and exercise regimen and follows up as directed. Complication include cad  . CORONARY ARTERY DISEASE  Patient presents in follow up of CAD. Patient was diagnosed in 2010. The patient has no associated CHF. The patient is currently taking a beta blocker, plavix, and aspirin. CAD was diagnosed 10 years ago.  Patient is having no angina. Patient has used no NTG.  Patient is followed by cardiology.  Patient had none . Last angiography was none, last echocardiogram none  Patient presents with hyperlipidemia.  Compliance with treatment has been good; patient takes medicines as directed, maintains low cholesterol diet, follows up as directed, and maintains exercise regimen.  Patient is using none medicines.  DAT she loses orientation when it gets dark..  no wandering. Gets agitated especially at night.  Current Outpatient Medications on File Prior to Visit  Medication Sig Dispense Refill   amLODipine (NORVASC) 5 MG tablet TAKE 1 TABLET (5 MG TOTAL) BY MOUTH DAILY. 90 tablet 2   Ascorbic Acid (VITAMIN C) 100 MG tablet Take by mouth daily.     aspirin EC 81 MG tablet Take 1 tablet (81 mg total) by mouth daily. Swallow whole. 90 tablet 3   b complex vitamins tablet Take 1 tablet by mouth daily.     brimonidine (ALPHAGAN P) 0.1 % SOLN 1 drop into affected eye     cholecalciferol (VITAMIN D3) 25 MCG (1000 UNIT) tablet Take by mouth daily.     cinacalcet (SENSIPAR) 30 MG tablet Take 30 mg by  mouth 2 (two) times daily.     Coenzyme Q10 (COQ10) 50 MG CAPS Take 50 mg by mouth daily.     dorzolamide-timolol (COSOPT) 22.3-6.8 MG/ML ophthalmic solution 1 drop at bedtime.     ELDERBERRY PO Take by mouth daily.     famotidine (PEPCID) 40 MG tablet Take 1 tablet (40 mg total) by mouth daily. 90 tablet 2   MAGNESIUM GLUCONATE PO Take by mouth daily.     metoprolol succinate (TOPROL-XL) 25 MG 24 hr tablet Take 3 tablets (75 mg total) by mouth daily. Schedule an appointment for further refills 90 tablet 0   nitroGLYCERIN (NITROSTAT) 0.4 MG SL tablet Place 1 tablet (0.4 mg total) under the tongue every 5 (five) minutes as needed. For chest pain 25 tablet 2   ramipril (ALTACE) 10 MG capsule TAKE 1 CAPSULE BY MOUTH EVERY DAY 90 capsule 2   sodium polystyrene (KAYEXALATE) powder Take by mouth.     ticagrelor (BRILINTA) 90 MG TABS tablet TAKE 1 TABLET BY MOUTH TWICE A DAY 180 tablet 3   triamcinolone cream (KENALOG) 0.1 % Apply 1 Application topically 2 (two) times daily. Both hands 45 g 1   [DISCONTINUED] simvastatin (ZOCOR) 40 MG tablet Take 1 tablet (40 mg total) by mouth at bedtime. 30 tablet 0   No current facility-administered medications on file prior to visit.   Past Medical History:  Diagnosis Date   Abscess 2003   I&D SCM abscess  Acute ischemic colitis ([place]) 04/10/2011   acute episode of ischemic colitis, with abd pain, CT evidence of transverse colitis, and blood streaked BM.  Resolved with conservative treatment.   Arthritis    "hands, joints, knees" (07/19/2016)   Bacteremia 2003   hx of staph bacteremia   CAD (coronary artery disease) 2003, 2010   s/p stenting of RCA x2, s/p stent to OM;  LHC 04/2009 in the setting of a NSTEMI: EF 65%, RCA stent patent with 60% ISR proximal and 40% ISR mid, PLV proximal 50-60% and distal 50%, ostial circumflex 30-40%, proximal OM mild in-stent restenosis with a branch with 90+ percent ostial stenosis, proximal LAD 30-40%.  FFR of ostial  circumflex:  0.96 - not flow limiting.  Medical therapy was continued.   Chronic lower back pain    "not a big problem" (07/19/2016)   Closed displaced fracture of distal phalanx of left middle finger 05/23/2017   Degenerative disc disease    Finger infection    GERD (gastroesophageal reflux disease)    History of medication noncompliance    concerning Plavix   HTN (hypertension)    Echocardiogram 08/26/11: Mild focal basal septal hypertrophy, EF 03-88%, grade 2 diastolic dysfunction, mild MR.  There was a fluid-filled area likely in the location of the liver noted   Hyperlipidemia    Hyperparathyroidism 2003   parathyroid adenoma localized to the right inferior thyroid lobe region   Hypothyroidism    Left renal artery stenosis ([facility])    40%   Lumbar stenosis with neurogenic claudication 2006   s/p decompression L2-L5   Myocardial infarction ([place]) ~ 2013; 05/06/2016   Osteoporosis 2009   diagnosed by bone density scan in 2009, on Vit D only, no bisphosphonate therapy   Phlebitis    LLE   PVD (peripheral vascular disease) ([facility])    s/p left fem-pop bypass graft   TIA (transient ischemic attack)    carotid dopplers 5/13: no ICA stenosis   Past Surgical History:  Procedure Laterality Date   AMPUTATION FINGER Left 05/24/2017   Procedure: AMPUTATION tip of long FINGER;  Surgeon: Dayna Barker, MD;  Location: [place];  Service: Plastics;  Laterality: Left;   CARDIAC CATHETERIZATION N/A 05/06/2016   Procedure: Left Heart Cath and Coronary Angiography;  Surgeon: Lorretta Harp, MD;  Location: [facility];  Service: Cardiovascular;  Laterality: N/A;   CARDIAC CATHETERIZATION N/A 05/06/2016   Procedure: Coronary Stent Intervention;  Surgeon: Lorretta Harp, MD;  Location: [facility];  Service: Cardiovascular;  Laterality: N/A;  RCA   CATARACT EXTRACTION, BILATERAL Bilateral    CORONARY ANGIOPLASTY WITH STENT PLACEMENT  09/2008   CORONARY ANGIOPLASTY WITH STENT PLACEMENT  04/2001    ENDOSCOPIC HEMILAMINOTOMY W/ DISCECTOMY LUMBAR  06/2004   L3, L4 lami; L2, L5 hemilami; decompresion L2-3, 3-4, 4-   FEMORAL BYPASS  before 2003   LLE   I & D EXTREMITY Left 05/24/2017   Procedure: IRRIGATION AND DEBRIDEMENT LEFT LONG FINGER;  Surgeon: Dayna Barker, MD;  Location: [facility];  Service: Plastics;  Laterality: Left;   INCISION AND DRAINAGE ANTERIOR NECK  10/2001   absces sternoclavicular joint    Family History  Problem Relation Age of Onset   Stroke Mother    Stroke Father    Social History   Socioeconomic History   Marital status: Widowed    Spouse name: Not on file   Number of children: Not on file   Years of education: Not on  file   Highest education level: Not on file  Occupational History   Not on file  Tobacco Use   Smoking status: Never   Smokeless tobacco: Never  Vaping Use   Vaping Use: Never used  Substance and Sexual Activity   Alcohol use: No   Drug use: No   Sexual activity: Never  Other Topics Concern   Not on file  Social History Narrative   Lives with daughter and granddaughter    PCP: Dr. Reinaldo Meeker in [place] Determinants of Health   Financial Resource Strain: Low Risk  (07/04/2021)   Overall Financial Resource Strain (CARDIA)    Difficulty of Paying Living Expenses: Not hard at all  Food Insecurity: No Food Insecurity (08/22/2021)   Hunger Vital Sign    Worried About Running Out of Food in the Last Year: Never true    [place] in the Last Year: Never true  Transportation Needs: No Transportation Needs (08/22/2021)   PRAPARE - Hydrologist (Medical): No    Lack of Transportation (Non-Medical): No  Physical Activity: Insufficiently Active (08/22/2021)   Exercise Vital Sign    Days of Exercise per Week: 2 days    Minutes of Exercise per Session: 20 min  Stress: No Stress Concern Present (08/22/2021)   [place]     Feeling of Stress : Not at all  Social Connections: Moderately Isolated (08/22/2021)   Social Connection and Isolation Panel [NHANES]    Frequency of Communication with Friends and Family: More than three times a week    Frequency of Social Gatherings with Friends and Family: More than three times a week    Attends Religious Services: More than 4 times per year    Active Member of [employer] or Organizations: No    Attends Archivist Meetings: Never    Marital Status: Widowed    Review of Systems  Constitutional:  Negative for chills, fatigue and fever.  HENT:  Negative for congestion, ear pain and sore throat.   Eyes:  Negative for visual disturbance.  Respiratory:  Negative for cough and shortness of breath.   Cardiovascular:  Negative for chest pain and palpitations.  Gastrointestinal:  Negative for abdominal pain, constipation, diarrhea, nausea and vomiting.  Endocrine: Negative for polydipsia, polyphagia and polyuria.  Genitourinary:  Negative for difficulty urinating and dysuria.  Musculoskeletal:  Negative for arthralgias, back pain and myalgias.  Skin:  Negative for rash.  Neurological:  Negative for headaches.  Psychiatric/Behavioral:  Positive for agitation and confusion. Negative for dysphoric mood and sleep disturbance. The patient is not nervous/anxious.      Objective:  BP 122/60   Pulse 76   Temp 98.3 F (36.8 C)   Resp 14   Ht '5\' 2"'$  (1.575 m)   Wt 95 lb 12.8 oz (43.5 kg)   BMI 17.52 kg/m      03/27/2022    2:50 PM 11/28/2021    1:45 PM 11/13/2021   11:43 AM  BP/Weight  Systolic BP 938 182 993  Diastolic BP 60 64 68  Wt. (Lbs) 95.8 103 101  BMI 17.52 kg/m2 18.84 kg/m2 17.34 kg/m2    Physical Exam Vitals reviewed.  Constitutional:      General: She is not in acute distress.    Appearance: Normal appearance.  HENT:     Head: Normocephalic and atraumatic.  Right Ear: Tympanic membrane normal.     Left Ear: Tympanic membrane normal.     Nose:  Nose normal.     Mouth/Throat:     Mouth: Mucous membranes are moist.     Pharynx: Oropharynx is clear.  Eyes:     Extraocular Movements: Extraocular movements intact.     Conjunctiva/sclera: Conjunctivae normal.     Pupils: Pupils are equal, round, and reactive to light.  Cardiovascular:     Rate and Rhythm: Normal rate and regular rhythm.     Pulses: Normal pulses.     Heart sounds: Normal heart sounds. No murmur heard.    No gallop.  Pulmonary:     Effort: Pulmonary effort is normal. No respiratory distress.     Breath sounds: Normal breath sounds. No wheezing.  Abdominal:     General: Abdomen is flat. Bowel sounds are normal. There is no distension.     Palpations: Abdomen is soft.     Tenderness: There is no abdominal tenderness.  Musculoskeletal:        General: Normal range of motion.     Cervical back: Normal range of motion.     Right lower leg: No edema.     Left lower leg: No edema.  Skin:    General: Skin is warm.     Capillary Refill: Capillary refill takes less than 2 seconds.  Neurological:     General: No focal deficit present.     Mental Status: She is alert and oriented to person, place, and time. Mental status is at baseline.     Gait: Gait normal.  Psychiatric:        Mood and Affect: Mood normal.        Thought Content: Thought content normal.         Lab Results  Component Value Date   WBC 7.8 03/27/2022   HGB 10.5 (L) 03/27/2022   HCT 32.6 (L) 03/27/2022   PLT 217 03/27/2022   GLUCOSE 76 03/27/2022   CHOL 325 (H) 03/27/2022   TRIG 282 (H) 03/27/2022   HDL 61 03/27/2022   LDLCALC 208 (H) 03/27/2022   ALT 9 03/27/2022   AST 19 03/27/2022   NA 144 03/27/2022   K 5.1 03/27/2022   CL 106 03/27/2022   CREATININE 1.82 (H) 03/27/2022   BUN 32 03/27/2022   CO2 23 03/27/2022   TSH 0.495 05/04/2020   INR 1.09 06/25/2016   HGBA1C 5.9 (H) 08/26/2011      Assessment & Plan:   Problem List Items Addressed This Visit       Cardiovascular  and Mediastinum   Hypertensive heart disease with heart failure ([place]) (Chronic)   Relevant Orders   Comprehensive metabolic panel (Completed)   CBC with Differential/Platelet (Completed) An individualized care plan was established and reinforced.  The patient's disease status was assessed using clinical finding son exam today, labs, and/or other diagnostic testing such as x-rays, to determine the patient's success in meeting treatmentgoalsbased on disease-based guidelines and found to beimproving. But not at goal yet. Medications prescriptions no changes Laboratory tests ordered to be performed today include routine. RECOMMENDATIONS: given include see cardiology.  Call physician is patient gains 3 lbs in one day or 5 lbs for one week.  Call for progressive PND, orthopnea or increased pedal edema.     Coronary artery disease involving native coronary artery of native heart without angina pectoris An individual plan was formulated based on patient history and exam, labs and  evidence based data. Patient has not had recent angina or nitroglycerin use. continue present treatment.      Digestive   GERD (gastroesophageal reflux disease) Plan of care was formulated today.  She is doing well.  A plan of care was formulated using patient exam, tests and other sources to optimize care using evidence based information.  Recommend no smoking, no eating after supper, avoid fatty foods, elevate Head of bed, avoid tight fitting clothing.  Continue on OTC.      Endocrine   Secondary hyperparathyroidism, non-renal ([place])   Relevant Orders   Calcium, ionized (Completed) Patient has hyperparathyroidism and sees endocrinologist     Nervous and Auditory   Alzheimer disease ([place]) - Primary   Relevant Medications   mirtazapine (REMERON) 7.5 MG tablet Progressive anger outbursts and behavior that upsets caregiver, we discussed this behavior at length, try low dose remeron     Musculoskeletal and Integument    Pain due to onychomycosis of toenails of both feet   Relevant Medications   traMADol (ULTRAM) 50 MG tablet Continue tramadol for pain     Other   Hyperlipidemia (Chronic)   Relevant Orders   Lipid panel (Completed) AN INDIVIDUAL CARE PLAN for hyperlipidemia/ cholesterol was established and reinforced today.  The patient's status was assessed using clinical findings on exam, lab and other diagnostic tests. The patient's disease status was assessed based on evidence-based guidelines and found to be fair controlled. MEDICATIONS were reviewed. SELF MANAGEMENT GOALS have been discussed and patient's success at attaining the goal of low cholesterol was assessed. RECOMMENDATION given include regular exercise 3 days a week and low cholesterol/low fat diet. CLINICAL SUMMARY including written plan to identify barriers unique to the patient due to social or economic  reasons was discussed.    Other Visit Diagnoses     Agitation       Relevant Medications   mirtazapine (REMERON) 7.5 MG tablet Try low dose remeron     .    Orders Placed This Encounter  Procedures   Comprehensive metabolic panel   Lipid panel   CBC with Differential/Platelet   Calcium, ionized   Cardiovascular Risk Assessment     Follow-up: Return in about 3 months (around 06/26/2022).  An After Visit Summary was printed and given to the patient.  Reinaldo Meeker, MD [name] [phone]

## 2022-03-28 LAB — COMPREHENSIVE METABOLIC PANEL
ALT: 9 IU/L (ref 0–32)
AST: 19 IU/L (ref 0–40)
Albumin/Globulin Ratio: 1.4 (ref 1.2–2.2)
Albumin: 4.3 g/dL (ref 3.6–4.6)
Alkaline Phosphatase: 158 IU/L — ABNORMAL HIGH (ref 44–121)
BUN/Creatinine Ratio: 18 (ref 12–28)
BUN: 32 mg/dL (ref 10–36)
Bilirubin Total: 0.2 mg/dL (ref 0.0–1.2)
CO2: 23 mmol/L (ref 20–29)
Calcium: 10.5 mg/dL — ABNORMAL HIGH (ref 8.7–10.3)
Chloride: 106 mmol/L (ref 96–106)
Creatinine, Ser: 1.82 mg/dL — ABNORMAL HIGH (ref 0.57–1.00)
Globulin, Total: 3.1 g/dL (ref 1.5–4.5)
Glucose: 76 mg/dL (ref 70–99)
Potassium: 5.1 mmol/L (ref 3.5–5.2)
Sodium: 144 mmol/L (ref 134–144)
Total Protein: 7.4 g/dL (ref 6.0–8.5)
eGFR: 25 mL/min/{1.73_m2} — ABNORMAL LOW (ref >59)

## 2022-03-28 LAB — CBC WITH DIFFERENTIAL/PLATELET
Basophils Absolute: 0.1 10*3/uL (ref 0.0–0.2)
Basos: 1 %
EOS (ABSOLUTE): 0.2 10*3/uL (ref 0.0–0.4)
Eos: 2 %
Hematocrit: 32.6 % — ABNORMAL LOW (ref 34.0–46.6)
Hemoglobin: 10.5 g/dL — ABNORMAL LOW (ref 11.1–15.9)
Immature Grans (Abs): 0 10*3/uL (ref 0.0–0.1)
Immature Granulocytes: 0 %
Lymphocytes Absolute: 1.9 10*3/uL (ref 0.7–3.1)
Lymphs: 25 %
MCH: 31.2 pg (ref 26.6–33.0)
MCHC: 32.2 g/dL (ref 31.5–35.7)
MCV: 97 fL (ref 79–97)
Monocytes Absolute: 0.6 10*3/uL (ref 0.1–0.9)
Monocytes: 7 %
Neutrophils Absolute: 5.1 10*3/uL (ref 1.4–7.0)
Neutrophils: 65 %
Platelets: 217 10*3/uL (ref 150–450)
RBC: 3.37 x10E6/uL — ABNORMAL LOW (ref 3.77–5.28)
RDW: 12.6 % (ref 11.7–15.4)
WBC: 7.8 10*3/uL (ref 3.4–10.8)

## 2022-03-28 LAB — LIPID PANEL
Chol/HDL Ratio: 5.3 ratio — ABNORMAL HIGH (ref 0.0–4.4)
Cholesterol, Total: 325 mg/dL — ABNORMAL HIGH (ref 100–199)
HDL: 61 mg/dL (ref >39)
LDL Chol Calc (NIH): 208 mg/dL — ABNORMAL HIGH (ref 0–99)
Triglycerides: 282 mg/dL — ABNORMAL HIGH (ref 0–149)
VLDL Cholesterol Cal: 56 mg/dL — ABNORMAL HIGH (ref 5–40)

## 2022-03-28 LAB — CALCIUM, IONIZED: Calcium, Ion: 5.5 mg/dL (ref 4.5–5.6)

## 2022-03-28 LAB — CARDIOVASCULAR RISK ASSESSMENT

## 2022-03-28 NOTE — Progress Notes (Signed)
Kidney test stable, calcium lower than usual, liver tests normal, triglycerides 282 very high, LDL 208 very high- at 89 what is she eating?, Anemia of chronic disease stable, ionized calcium 5.5 ok. Patient is 85 and some dementia, patient does not want statins lp

## 2022-04-03 ENCOUNTER — Other Ambulatory Visit: Payer: Self-pay | Admitting: Cardiology

## 2022-04-04 ENCOUNTER — Other Ambulatory Visit: Payer: Self-pay

## 2022-04-05 MED ORDER — TRIAMCINOLONE ACETONIDE 0.1 % EX CREA: 1.0000 | TOPICAL_CREAM | Freq: Two times a day (BID) | CUTANEOUS | 1 refills | Status: DC

## 2022-04-17 ENCOUNTER — Other Ambulatory Visit: Payer: Self-pay | Admitting: Legal Medicine

## 2022-04-18 ENCOUNTER — Other Ambulatory Visit: Payer: Self-pay | Admitting: Legal Medicine

## 2022-04-18 DIAGNOSIS — R451 Restlessness and agitation: Secondary | ICD-10-CM

## 2022-04-22 ENCOUNTER — Other Ambulatory Visit: Payer: Self-pay | Admitting: Legal Medicine

## 2022-04-22 DIAGNOSIS — K219 Gastro-esophageal reflux disease without esophagitis: Secondary | ICD-10-CM

## 2022-04-24 ENCOUNTER — Telehealth: Payer: Self-pay

## 2022-04-24 ENCOUNTER — Other Ambulatory Visit: Payer: Self-pay | Admitting: Family Medicine

## 2022-04-24 DIAGNOSIS — I110 Hypertensive heart disease with heart failure: Secondary | ICD-10-CM

## 2022-04-24 DIAGNOSIS — D649 Anemia, unspecified: Secondary | ICD-10-CM

## 2022-04-24 NOTE — Telephone Encounter (Signed)
Patients granddaughter called to see if the blood work we need to collect from patient in 2 weeks be taken at her Dr appointment in [place] with  (Dr Percival Spanish). Granddaughter just didn't want her to poked so many times. So we need to let grand daughter know what labs we need at our office.  [place] daughter number is [phone]  Then results will be put in [place].

## 2022-04-25 DIAGNOSIS — I10 Essential (primary) hypertension: Secondary | ICD-10-CM | POA: Insufficient documentation

## 2022-04-25 NOTE — Progress Notes (Signed)
Cardiology Office Note   Date:  04/26/2022   ID:  [name], DOB 28-May-1925, MRN [id number]  PCP:  Lillard Anes, MD (Inactive)  Cardiologist:   Minus Breeding, MD   Chief Complaint  Patient presents with   Coronary Artery Disease       History of Present Illness: [name] is a 89 y.o. female who presents  for followup of known coronary disease. She was in the hospital in Jan 2018 with NSTEMI. She had an acute inferior lateral STEMI.   This was felt to be secondary to RCA occlusion. She had stenting of distal RCA into PDA with DES. The remainder of RCA was treated with POBA. She does have residual disease in mid LAD up to 90%. This is a heavily calcified vessel. Per Dr. Martinique  the left main disease is significant.  The LAD disease is a complex lesion and would require atherectomy. Given her age the decision was to manage medically but if she has recurrent angina it was thought that it would be reasonable to consider PCI of LAD.  She was started on a statin. She came back to the hospital with chest pain but this time was found to have elevated liver enzymes.  She was taken off statin.     Since I last saw her she was in the emergency room in April.  I did review these records for this visit.  She had some chest discomfort.  I did see that her troponins were in the 80s x 2.  However, they thought she was dehydrated and did not think she was having an acute coronary event and she was not hospitalized.  Since then she has had no cardiovascular complaints.  She gets around slowly with a cane.  She goes to the grocery store occasionally.  She does not have any chest pressure, neck or arm discomfort.  Her daughter says she is not having any new shortness of breath, PND or orthopnea.  She has had no palpitations, presyncope or syncope.  She has had no weight gain or edema.   Past Medical History:  Diagnosis Date   Abscess 2003   I&D SCM abscess   Acute ischemic colitis  ([place]) 04/10/2011   acute episode of ischemic colitis, with abd pain, CT evidence of transverse colitis, and blood streaked BM.  Resolved with conservative treatment.   Arthritis    "hands, joints, knees" (07/19/2016)   Bacteremia 2003   hx of staph bacteremia   CAD (coronary artery disease) 2003, 2010   s/p stenting of RCA x2, s/p stent to OM;  LHC 04/2009 in the setting of a NSTEMI: EF 65%, RCA stent patent with 60% ISR proximal and 40% ISR mid, PLV proximal 50-60% and distal 50%, ostial circumflex 30-40%, proximal OM mild in-stent restenosis with a branch with 90+ percent ostial stenosis, proximal LAD 30-40%.  FFR of ostial circumflex:  0.96 - not flow limiting.  Medical therapy was continued.   Chronic lower back pain    "not a big problem" (07/19/2016)   Closed displaced fracture of distal phalanx of left middle finger 05/23/2017   Degenerative disc disease    Finger infection    GERD (gastroesophageal reflux disease)    History of medication noncompliance    concerning Plavix   HTN (hypertension)    Echocardiogram 08/26/11: Mild focal basal septal hypertrophy, EF 14-43%, grade 2 diastolic dysfunction, mild MR.  There was a fluid-filled area likely in the  location of the liver noted   Hyperlipidemia    Hyperparathyroidism 2003   parathyroid adenoma localized to the right inferior thyroid lobe region   Hypothyroidism    Left renal artery stenosis (HCC)    40%   Lumbar stenosis with neurogenic claudication 2006   s/p decompression L2-L5   Myocardial infarction ([place]) ~ 2013; 05/06/2016   Osteoporosis 2009   diagnosed by bone density scan in 2009, on Vit D only, no bisphosphonate therapy   Phlebitis    LLE   PVD (peripheral vascular disease) (HCC)    s/p left fem-pop bypass graft   TIA (transient ischemic attack)    carotid dopplers 5/13: no ICA stenosis    Past Surgical History:  Procedure Laterality Date   AMPUTATION FINGER Left 05/24/2017   Procedure: AMPUTATION tip of long FINGER;   Surgeon: Dayna Barker, MD;  Location: [facility];  Service: Plastics;  Laterality: Left;   CARDIAC CATHETERIZATION N/A 05/06/2016   Procedure: Left Heart Cath and Coronary Angiography;  Surgeon: Lorretta Harp, MD;  Location: [facility];  Service: Cardiovascular;  Laterality: N/A;   CARDIAC CATHETERIZATION N/A 05/06/2016   Procedure: Coronary Stent Intervention;  Surgeon: Lorretta Harp, MD;  Location: [facility];  Service: Cardiovascular;  Laterality: N/A;  RCA   CATARACT EXTRACTION, BILATERAL Bilateral    CORONARY ANGIOPLASTY WITH STENT PLACEMENT  09/2008   CORONARY ANGIOPLASTY WITH STENT PLACEMENT  04/2001   ENDOSCOPIC HEMILAMINOTOMY W/ DISCECTOMY LUMBAR  06/2004   L3, L4 lami; L2, L5 hemilami; decompresion L2-3, 3-4, 4-   FEMORAL BYPASS  before 2003   LLE   I & D EXTREMITY Left 05/24/2017   Procedure: IRRIGATION AND DEBRIDEMENT LEFT LONG FINGER;  Surgeon: Dayna Barker, MD;  Location: [facility];  Service: Plastics;  Laterality: Left;   INCISION AND DRAINAGE ANTERIOR NECK  10/2001   absces sternoclavicular joint     Current Outpatient Medications  Medication Sig Dispense Refill   amLODipine (NORVASC) 5 MG tablet TAKE 1 TABLET (5 MG TOTAL) BY MOUTH DAILY. 90 tablet 2   Ascorbic Acid (VITAMIN C) 100 MG tablet Take by mouth daily.     aspirin EC 81 MG tablet Take 1 tablet (81 mg total) by mouth daily. Swallow whole. 90 tablet 3   b complex vitamins tablet Take 1 tablet by mouth daily.     brimonidine (ALPHAGAN P) 0.1 % SOLN 1 drop into affected eye     cholecalciferol (VITAMIN D3) 25 MCG (1000 UNIT) tablet Take by mouth daily.     cinacalcet (SENSIPAR) 30 MG tablet Take 30 mg by mouth 2 (two) times daily.     Coenzyme Q10 (COQ10) 50 MG CAPS Take 50 mg by mouth daily.     dorzolamide-timolol (COSOPT) 22.3-6.8 MG/ML ophthalmic solution 1 drop at bedtime.     ELDERBERRY PO Take by mouth daily.     famotidine (PEPCID) 40 MG tablet TAKE 1 TABLET BY MOUTH EVERY DAY 90 tablet 2    MAGNESIUM GLUCONATE PO Take by mouth daily.     metoprolol succinate (TOPROL-XL) 25 MG 24 hr tablet Take 3 tablets (75 mg total) by mouth daily. 270 tablet 1   mirtazapine (REMERON) 7.5 MG tablet Take 1 tablet (7.5 mg total) by mouth at bedtime. 30 tablet 5   nitroGLYCERIN (NITROSTAT) 0.4 MG SL tablet Place 1 tablet (0.4 mg total) under the tongue every 5 (five) minutes as needed. For chest pain 25 tablet 2   ramipril (ALTACE) 10  MG capsule TAKE 1 CAPSULE BY MOUTH EVERY DAY 90 capsule 2   sodium polystyrene (KAYEXALATE) powder Take by mouth.     ticagrelor (BRILINTA) 90 MG TABS tablet TAKE 1 TABLET BY MOUTH TWICE A DAY 180 tablet 3   traMADol (ULTRAM) 50 MG tablet Take 2 tablets (100 mg total) by mouth daily. 60 tablet 3   triamcinolone cream (KENALOG) 0.1 % Apply 1 Application topically 2 (two) times daily. Both hands 45 g 1   No current facility-administered medications for this visit.    Allergies:   Diphenhydramine hcl, Penicillins, and Shellfish allergy    ROS:  Please see the history of present illness.   Otherwise, review of systems are positive for none.   All other systems are reviewed and negative.    PHYSICAL EXAM: VS:  BP 108/62   Pulse (!) 57   Ht '5\' 2"'$  (1.575 m)   Wt 92 lb 6.4 oz (41.9 kg)   SpO2 93%   BMI 16.90 kg/m  , BMI Body mass index is 16.9 kg/m. GENERAL:  Well appearing NECK:  No jugular venous distention, waveform within normal limits, carotid upstroke brisk and symmetric, no bruits, no thyromegaly LUNGS:  Clear to auscultation bilaterally CHEST:  Unremarkable HEART:  PMI not displaced or sustained,S1 and S2 within normal limits, no S3, no S4, no clicks, no rubs, no murmurs ABD:  Flat, positive bowel sounds normal in frequency in pitch, no bruits, no rebound, no guarding, no midline pulsatile mass, no hepatomegaly, no splenomegaly EXT:  2 plus pulses throughout, no edema, no cyanosis no clubbing  EKG:  EKG is ordered today. The ekg ordered today  demonstrates sinus rhythm, rate 57 left axis deviation, no acute ST-T wave advised.   Recent Labs: 03/27/2022: ALT 9; BUN 32; Creatinine, Ser 1.82; Hemoglobin 10.5; Platelets 217; Potassium 5.1; Sodium 144    Lipid Panel    Component Value Date/Time   CHOL 325 (H) 03/27/2022 1544   TRIG 282 (H) 03/27/2022 1544   HDL 61 03/27/2022 1544   CHOLHDL 5.3 (H) 03/27/2022 1544   CHOLHDL 3.7 05/06/2016 1940   VLDL 13 05/06/2016 1940   LDLCALC 208 (H) 03/27/2022 1544      Wt Readings from Last 3 Encounters:  04/26/22 92 lb 6.4 oz (41.9 kg)  03/27/22 95 lb 12.8 oz (43.5 kg)  11/28/21 103 lb (46.7 kg)      Other studies Reviewed: Additional studies/ records that were reviewed today include: ED records. Review of the above records demonstrates:  Please see elsewhere in the note.     ASSESSMENT AND PLAN:   CAD:  The patient has no new sypmtoms.  No further cardiovascular testing is indicated.  We will continue with aggressive risk reduction and meds as listed.  Of note we continue dual antiplatelet therapy given her severe disease and the fact that we are pursuing medical management.    HTN:  The blood pressure is at target.  If it starts to run low in the future I told her daughter to give me a call and we will start to back off on medicines.   DYSLIPIDEMIA:     She has been intolerant of statins and has not wanted a PCSK9.  No change in therapy.  She is not tolerant of statins.  We talked about a PCSK9 inhibitor but she really would not be interested in this.    Current medicines are reviewed at length with the patient today.  The patient does not have  concerns regarding medicines.  The following changes have been made:  no change  Labs/ tests ordered today include: None  Orders Placed This Encounter  Procedures   EKG 12-Lead      Disposition:   FU with me in 12 months.     Signed, Minus Breeding, MD  04/26/2022 5:12 PM    [facility]

## 2022-04-26 ENCOUNTER — Encounter: Payer: Self-pay | Admitting: Cardiology

## 2022-04-26 ENCOUNTER — Ambulatory Visit: Payer: Medicare Other | Attending: Cardiology | Admitting: Cardiology

## 2022-04-26 VITALS — BP 108/62 | HR 57 | Ht 62.0 in | Wt 92.4 lb

## 2022-04-26 DIAGNOSIS — I2510 Atherosclerotic heart disease of native coronary artery without angina pectoris: Secondary | ICD-10-CM

## 2022-04-26 DIAGNOSIS — I10 Essential (primary) hypertension: Secondary | ICD-10-CM | POA: Insufficient documentation

## 2022-04-26 DIAGNOSIS — E785 Hyperlipidemia, unspecified: Secondary | ICD-10-CM

## 2022-04-26 NOTE — Patient Instructions (Addendum)
Medication Instructions:  NO CHANGES *If you need a refill on your cardiac medications before your next appointment, please call your pharmacy*  Follow-Up: At [facility], you and your health needs are our priority.  As part of our continuing mission to provide you with exceptional heart care, we have created designated Provider Care Teams.  These Care Teams include your primary Cardiologist (physician) and Advanced Practice Providers (APPs -  Physician Assistants and Nurse Practitioners) who all work together to provide you with the care you need, when you need it.  We recommend signing up for the patient portal called "MyChart".  Sign up information is provided on this After Visit Summary.  MyChart is used to connect with patients for Virtual Visits (Telemedicine).  Patients are able to view lab/test results, encounter notes, upcoming appointments, etc.  Non-urgent messages can be sent to your provider as well.   To learn more about what you can do with MyChart, go to [email or web address].    Your next appointment:   1 year(s)  Provider:   Minus Breeding, MD

## 2022-05-13 ENCOUNTER — Ambulatory Visit (INDEPENDENT_AMBULATORY_CARE_PROVIDER_SITE_OTHER): Payer: Medicare Other | Admitting: Podiatry

## 2022-05-13 ENCOUNTER — Encounter: Payer: Self-pay | Admitting: Podiatry

## 2022-05-13 DIAGNOSIS — B351 Tinea unguium: Secondary | ICD-10-CM

## 2022-05-13 DIAGNOSIS — M79674 Pain in right toe(s): Secondary | ICD-10-CM

## 2022-05-13 DIAGNOSIS — I739 Peripheral vascular disease, unspecified: Secondary | ICD-10-CM | POA: Diagnosis not present

## 2022-05-13 DIAGNOSIS — M79675 Pain in left toe(s): Secondary | ICD-10-CM

## 2022-05-13 NOTE — Progress Notes (Unsigned)
  Subjective:  Patient ID: [name], female    DOB: 1925-07-08,  MRN: [id number]  [name] presents to clinic today for {jgcomplaint:23593}  Chief Complaint  Patient presents with   Follow-up    Routine Foot care Patient is not a diabetic B.S.-n/a A1c-n/a PCP/last visit- Kristen [name],MD-New Patient on 07/03/21 Patient is taking all medications as prescribed.   New problem(s): None.   PCP is [name], Kirsten, MD.  Allergies  Allergen Reactions   Diphenhydramine Hcl Other (See Comments)    unknown   Penicillins Other (See Comments)    unknown   Shellfish Allergy Other (See Comments)    unknown   Review of Systems: Negative except as noted in the HPI.  Objective: No changes noted in today's physical examination. Vitals:   [name] is a pleasant 89 y.o. female {jgbodyhabitus:24098} AAO x 3.  Vascular Examination: CFT <3 seconds b/l. Faintly palpable DP pulses b/l LE. Diminished PT pulse(s) b/l LE.. Skin temperature gradient warm to warm b/l. No pain with calf compression. No ischemia or gangrene. No cyanosis or clubbing noted b/l. Pedal hair absent. No edema noted b/l LE.   Neurological Examination: Sensation grossly intact b/l with 10 gram monofilament. Vibratory sensation intact b/l.   Dermatological Examination: Pedal skin warm and supple b/l. Toenails 1-5 b/l thick, discolored, elongated with subungual debris and pain on dorsal palpation.  No open wounds b/l LE. No interdigital macerations noted b/l LE.  Musculoskeletal Examination: Muscle strength 5/5 to b/l LE. No pain, crepitus or joint limitation noted with ROM bilateral LE. HAV with bunion deformity noted b/l LE.  Radiographs: None Assessment/Plan: No diagnosis found.  No orders of the defined types were placed in this encounter.   None {Jgplan:23602::"-Patient/POA to call should there be question/concern in the interim."}   Return in about 3 months (around 08/12/2022).  Marzetta Board, DPM

## 2022-05-21 ENCOUNTER — Telehealth: Payer: Self-pay

## 2022-05-21 DIAGNOSIS — N289 Disorder of kidney and ureter, unspecified: Secondary | ICD-10-CM | POA: Diagnosis not present

## 2022-05-21 DIAGNOSIS — M810 Age-related osteoporosis without current pathological fracture: Secondary | ICD-10-CM | POA: Diagnosis not present

## 2022-05-21 DIAGNOSIS — E210 Primary hyperparathyroidism: Secondary | ICD-10-CM | POA: Diagnosis not present

## 2022-05-21 DIAGNOSIS — D509 Iron deficiency anemia, unspecified: Secondary | ICD-10-CM | POA: Diagnosis not present

## 2022-05-21 DIAGNOSIS — E785 Hyperlipidemia, unspecified: Secondary | ICD-10-CM | POA: Diagnosis not present

## 2022-05-21 LAB — LIPID PANEL
Cholesterol: 319 — AB (ref 0–200)
HDL: 59 (ref 35–70)
LDL Cholesterol: 215
Triglycerides: 227 — AB (ref 40–160)

## 2022-05-21 NOTE — Telephone Encounter (Signed)
Patient Granddaughter called and stated she needs labs sent over for patient to get them done at an appointment she has today at 3:30, I tried to call granddaughter back to get fax number to fax labs and left message.

## 2022-05-23 NOTE — Telephone Encounter (Signed)
Patient granddaughter called back and left a message stating she got the labs we needed from my chart and they were done at [place] appointment 05/21/22. She did have a question about patient anemia ?  Called back left message for granddaughter to call office back. Labs can be seen in Care everywhere

## 2022-05-29 ENCOUNTER — Encounter: Payer: Self-pay | Admitting: Family Medicine

## 2022-05-29 DIAGNOSIS — N289 Disorder of kidney and ureter, unspecified: Secondary | ICD-10-CM | POA: Diagnosis not present

## 2022-05-30 NOTE — Telephone Encounter (Signed)
Patient's daughter states that patient had bloodwork yesterday and she would like to Dr Tobie Poet sees these results first before to recheck bloodwork.

## 2022-06-18 ENCOUNTER — Other Ambulatory Visit: Payer: Self-pay | Admitting: Physician Assistant

## 2022-06-18 MED ORDER — TRIAMCINOLONE ACETONIDE 0.1 % EX CREA: 1.0000 | TOPICAL_CREAM | Freq: Two times a day (BID) | CUTANEOUS | 1 refills | Status: DC

## 2022-07-04 ENCOUNTER — Ambulatory Visit (INDEPENDENT_AMBULATORY_CARE_PROVIDER_SITE_OTHER): Payer: Medicare Other | Admitting: Family Medicine

## 2022-07-04 VITALS — BP 120/68 | HR 61 | Temp 97.0°F | Ht 62.0 in | Wt 89.8 lb

## 2022-07-04 DIAGNOSIS — E782 Mixed hyperlipidemia: Secondary | ICD-10-CM

## 2022-07-04 DIAGNOSIS — K219 Gastro-esophageal reflux disease without esophagitis: Secondary | ICD-10-CM | POA: Diagnosis not present

## 2022-07-04 DIAGNOSIS — E213 Hyperparathyroidism, unspecified: Secondary | ICD-10-CM

## 2022-07-04 DIAGNOSIS — I509 Heart failure, unspecified: Secondary | ICD-10-CM | POA: Diagnosis not present

## 2022-07-04 DIAGNOSIS — I2510 Atherosclerotic heart disease of native coronary artery without angina pectoris: Secondary | ICD-10-CM | POA: Diagnosis not present

## 2022-07-04 DIAGNOSIS — G309 Alzheimer's disease, unspecified: Secondary | ICD-10-CM

## 2022-07-04 DIAGNOSIS — I110 Hypertensive heart disease with heart failure: Secondary | ICD-10-CM

## 2022-07-04 DIAGNOSIS — R413 Other amnesia: Secondary | ICD-10-CM

## 2022-07-04 DIAGNOSIS — F0280 Dementia in other diseases classified elsewhere without behavioral disturbance: Secondary | ICD-10-CM | POA: Diagnosis not present

## 2022-07-04 MED ORDER — DONEPEZIL HCL 5 MG PO TABS: 10.0000 mg | ORAL_TABLET | Freq: Every day | ORAL | 0 refills | Status: DC

## 2022-07-04 NOTE — Progress Notes (Signed)
Subjective:  Patient ID: [name], female    DOB: Aug 21, 1925  Age: 89 y.o. MRN: [id number]  Chief Complaint  Patient presents with   Hypertension   Gastroesophageal Reflux    HPI Hypertensive heart disease: Norvasc 5 mg daily, Metoprolol 25 mg (75 mg total-take 2 tablets in the a.m. and 1 qhs), ramipril 10 mg daily. HX of MI, CAD, PVD. Has some swelling in his feet (Left > right.)  GERD: Pepcid 40 mg daily  Dementia/Depression: Remeron 7.5 mg qhs  Blood Thinner: Brilinta 90 mg twice daily   Alzheimer Disease: very poor memory.      07/04/2022    2:40 PM 08/22/2021    3:07 PM 12/05/2020    3:02 PM 10/06/2019    2:57 PM  Depression screen PHQ 2/9  Decreased Interest 0 0 0 1  Down, Depressed, Hopeless 0 0 0 0  PHQ - 2 Score 0 0 0 1         10/06/2019    2:50 PM 12/05/2020    3:02 PM 08/01/2021    2:04 PM 08/22/2021    3:10 PM 07/04/2022    2:40 PM  Fall Risk  Falls in the past year? 0 0  0 0  Was there an injury with Fall? 0 0  0 0  Fall Risk Category Calculator 0 0  0 0  Fall Risk Category (Retired) Low Low  Low   (RETIRED) Patient Fall Risk Level Low fall risk Low fall risk Moderate fall risk Moderate fall risk   Patient at Risk for Falls Due to    Impaired balance/gait No Fall Risks;Impaired balance/gait  Fall risk Follow up    Falls prevention discussed Falls evaluation completed      Review of Systems  Constitutional:  Negative for chills, fatigue and fever.  HENT:  Negative for congestion, ear pain and sore throat.   Respiratory:  Negative for cough and shortness of breath.   Cardiovascular:  Negative for chest pain.  Gastrointestinal:  Negative for abdominal pain, constipation, diarrhea, nausea and vomiting.  Genitourinary:  Negative for dysuria and urgency.  Musculoskeletal:  Negative for arthralgias and myalgias.  Skin:  Negative for rash.  Neurological:  Negative for dizziness and headaches.  Psychiatric/Behavioral:  Negative for dysphoric mood. The  patient is not nervous/anxious.     Current Outpatient Medications on File Prior to Visit  Medication Sig Dispense Refill   amLODipine (NORVASC) 5 MG tablet TAKE 1 TABLET (5 MG TOTAL) BY MOUTH DAILY. 90 tablet 2   Ascorbic Acid (VITAMIN C) 100 MG tablet Take by mouth daily.     aspirin EC 81 MG tablet Take 1 tablet (81 mg total) by mouth daily. Swallow whole. 90 tablet 3   b complex vitamins tablet Take 1 tablet by mouth daily.     brimonidine (ALPHAGAN P) 0.1 % SOLN 1 drop into affected eye     cholecalciferol (VITAMIN D3) 25 MCG (1000 UNIT) tablet Take by mouth daily.     cinacalcet (SENSIPAR) 30 MG tablet Take 30 mg by mouth 2 (two) times daily.     Coenzyme Q10 (COQ10) 50 MG CAPS Take 50 mg by mouth daily.     dorzolamide-timolol (COSOPT) 22.3-6.8 MG/ML ophthalmic solution 1 drop at bedtime.     ELDERBERRY PO Take by mouth daily.     famotidine (PEPCID) 40 MG tablet TAKE 1 TABLET BY MOUTH EVERY DAY 90 tablet 2   MAGNESIUM GLUCONATE PO Take by mouth daily.  metoprolol succinate (TOPROL-XL) 25 MG 24 hr tablet Take 3 tablets (75 mg total) by mouth daily. 270 tablet 1   mirtazapine (REMERON) 7.5 MG tablet TAKE 1 TABLET BY MOUTH AT BEDTIME. 90 tablet 2   nitroGLYCERIN (NITROSTAT) 0.4 MG SL tablet Place 1 tablet (0.4 mg total) under the tongue every 5 (five) minutes as needed. For chest pain 25 tablet 2   ramipril (ALTACE) 10 MG capsule TAKE 1 CAPSULE BY MOUTH EVERY DAY 90 capsule 2   ticagrelor (BRILINTA) 90 MG TABS tablet TAKE 1 TABLET BY MOUTH TWICE A DAY 180 tablet 3   traMADol (ULTRAM) 50 MG tablet Take 2 tablets (100 mg total) by mouth daily. 60 tablet 3   triamcinolone cream (KENALOG) 0.1 % Apply 1 Application topically 2 (two) times daily. Both hands 45 g 1   [DISCONTINUED] simvastatin (ZOCOR) 40 MG tablet Take 1 tablet (40 mg total) by mouth at bedtime. 30 tablet 0   No current facility-administered medications on file prior to visit.   Past Medical History:  Diagnosis Date    Abscess 2003   I&D SCM abscess   Acute ischemic colitis ([place]) 04/10/2011   acute episode of ischemic colitis, with abd pain, CT evidence of transverse colitis, and blood streaked BM.  Resolved with conservative treatment.   Arthritis    "hands, joints, knees" (07/19/2016)   Bacteremia 2003   hx of staph bacteremia   CAD (coronary artery disease) 2003, 2010   s/p stenting of RCA x2, s/p stent to OM;  LHC 04/2009 in the setting of a NSTEMI: EF 65%, RCA stent patent with 60% ISR proximal and 40% ISR mid, PLV proximal 50-60% and distal 50%, ostial circumflex 30-40%, proximal OM mild in-stent restenosis with a branch with 90+ percent ostial stenosis, proximal LAD 30-40%.  FFR of ostial circumflex:  0.96 - not flow limiting.  Medical therapy was continued.   Chronic lower back pain    "not a big problem" (07/19/2016)   Closed displaced fracture of distal phalanx of left middle finger 05/23/2017   Degenerative disc disease    Finger infection    GERD (gastroesophageal reflux disease)    History of medication noncompliance    concerning Plavix   HTN (hypertension)    Echocardiogram 08/26/11: Mild focal basal septal hypertrophy, EF 0000000, grade 2 diastolic dysfunction, mild MR.  There was a fluid-filled area likely in the location of the liver noted   Hyperlipidemia    Hyperparathyroidism 2003   parathyroid adenoma localized to the right inferior thyroid lobe region   Hypothyroidism    Left renal artery stenosis (HCC)    40%   Lumbar stenosis with neurogenic claudication 2006   s/p decompression L2-L5   Myocardial infarction ([place]) ~ 2013; 05/06/2016   Osteoporosis 2009   diagnosed by bone density scan in 2009, on Vit D only, no bisphosphonate therapy   Phlebitis    LLE   PVD (peripheral vascular disease) (HCC)    s/p left fem-pop bypass graft   TIA (transient ischemic attack)    carotid dopplers 5/13: no ICA stenosis   Past Surgical History:  Procedure Laterality Date   AMPUTATION FINGER  Left 05/24/2017   Procedure: AMPUTATION tip of long FINGER;  Surgeon: Dayna Barker, MD;  Location: [facility];  Service: Plastics;  Laterality: Left;   CARDIAC CATHETERIZATION N/A 05/06/2016   Procedure: Left Heart Cath and Coronary Angiography;  Surgeon: Lorretta Harp, MD;  Location: [facility];  Service: Cardiovascular;  Laterality:  N/A;   CARDIAC CATHETERIZATION N/A 05/06/2016   Procedure: Coronary Stent Intervention;  Surgeon: Lorretta Harp, MD;  Location: [facility];  Service: Cardiovascular;  Laterality: N/A;  RCA   CATARACT EXTRACTION, BILATERAL Bilateral    CORONARY ANGIOPLASTY WITH STENT PLACEMENT  09/2008   CORONARY ANGIOPLASTY WITH STENT PLACEMENT  04/2001   ENDOSCOPIC HEMILAMINOTOMY W/ DISCECTOMY LUMBAR  06/2004   L3, L4 lami; L2, L5 hemilami; decompresion L2-3, 3-4, 4-   FEMORAL BYPASS  before 2003   LLE   I & D EXTREMITY Left 05/24/2017   Procedure: IRRIGATION AND DEBRIDEMENT LEFT LONG FINGER;  Surgeon: Dayna Barker, MD;  Location: [facility];  Service: Plastics;  Laterality: Left;   INCISION AND DRAINAGE ANTERIOR NECK  10/2001   absces sternoclavicular joint    Family History  Problem Relation Age of Onset   Stroke Mother    Stroke Father    Social History   Socioeconomic History   Marital status: Widowed    Spouse name: Not on file   Number of children: Not on file   Years of education: Not on file   Highest education level: Not on file  Occupational History   Not on file  Tobacco Use   Smoking status: Never   Smokeless tobacco: Never  Vaping Use   Vaping Use: Never used  Substance and Sexual Activity   Alcohol use: No   Drug use: No   Sexual activity: Never  Other Topics Concern   Not on file  Social History Narrative   Lives with daughter and granddaughter    PCP: Dr. Reinaldo Meeker in [place] Determinants of Health   Financial Resource Strain: Low Risk  (07/04/2021)   Overall Financial Resource Strain (CARDIA)    Difficulty  of Paying Living Expenses: Not hard at all  Food Insecurity: No Food Insecurity (08/22/2021)   Hunger Vital Sign    Worried About Running Out of Food in the Last Year: Never true    [place] in the Last Year: Never true  Transportation Needs: No Transportation Needs (08/22/2021)   PRAPARE - Hydrologist (Medical): No    Lack of Transportation (Non-Medical): No  Physical Activity: Insufficiently Active (08/22/2021)   Exercise Vital Sign    Days of Exercise per Week: 2 days    Minutes of Exercise per Session: 20 min  Stress: No Stress Concern Present (08/22/2021)   [place]    Feeling of Stress : Not at all  Social Connections: Moderately Isolated (08/22/2021)   Social Connection and Isolation Panel [NHANES]    Frequency of Communication with Friends and Family: More than three times a week    Frequency of Social Gatherings with Friends and Family: More than three times a week    Attends Religious Services: More than 4 times per year    Active Member of [employer] or Organizations: No    Attends Archivist Meetings: Never    Marital Status: Widowed    Objective:  BP 120/68   Pulse 61   Temp (!) 97 F (36.1 C)   Ht 5\' 2"  (1.575 m)   Wt 89 lb 12.8 oz (40.7 kg)   SpO2 99%   BMI 16.42 kg/m      07/04/2022    2:29 PM 04/26/2022    4:21 PM 03/27/2022    2:50 PM  BP/Weight  Systolic BP 123456 [id number]  Diastolic BP 68 62 60  Wt. (Lbs) 89.8 92.4 95.8  BMI 16.42 kg/m2 16.9 kg/m2 17.52 kg/m2    Physical Exam Vitals reviewed.  Constitutional:      Appearance: Normal appearance.  Neck:     Vascular: No carotid bruit.  Cardiovascular:     Rate and Rhythm: Normal rate and regular rhythm.     Heart sounds: Normal heart sounds.  Pulmonary:     Effort: Pulmonary effort is normal. No respiratory distress.     Breath sounds: Normal breath sounds.  Abdominal:     General: Abdomen  is flat. Bowel sounds are normal.     Palpations: Abdomen is soft.     Tenderness: There is no abdominal tenderness.  Neurological:     Mental Status: She is alert.  Psychiatric:        Mood and Affect: Mood normal.        Behavior: Behavior normal.     Diabetic Foot Exam - Simple   No data filed      Lab Results  Component Value Date   WBC 7.8 03/27/2022   HGB 10.5 (L) 03/27/2022   HCT 32.6 (L) 03/27/2022   PLT 217 03/27/2022   GLUCOSE 76 03/27/2022   CHOL 319 (A) 05/21/2022   TRIG 227 (A) 05/21/2022   HDL 59 05/21/2022   LDLCALC 215 05/21/2022   ALT 9 03/27/2022   AST 19 03/27/2022   NA 144 03/27/2022   K 5.1 03/27/2022   CL 106 03/27/2022   CREATININE 1.82 (H) 03/27/2022   BUN 32 03/27/2022   CO2 23 03/27/2022   TSH 0.495 05/04/2020   INR 1.09 06/25/2016   HGBA1C 5.9 (H) 08/26/2011      Assessment & Plan:    Mixed hyperlipidemia Assessment & Plan: Statins are not indicated due to progressed dementia.    Gastroesophageal reflux disease without esophagitis Assessment & Plan: The current medical regimen is effective;  continue present plan and medications. Continue pepcid 40 mg daily.    Coronary artery disease involving native coronary artery of native heart without angina pectoris Assessment & Plan: Continue aspirin 81 mg daily, brilinta 90 mg twice daily, ramipril 10 mg daily, metoprolol xl 25 mg 3 tablets daily. Has ntg.   Alzheimer disease [facility]) Assessment & Plan: Start aricept 5 mg before bed.   Orders: -     CBC with Differential/Platelet -     Comprehensive metabolic panel -     TSH -     B12 and Folate Panel -     Methylmalonic acid, serum -     Donepezil HCl; Take 2 tablets (10 mg total) by mouth at bedtime.  Dispense: 30 tablet; Refill: 0  Hypertensive heart disease with heart failure [facility]) Assessment & Plan: The current medical regimen is effective;  continue present plan and medications. Continue Norvasc 5 mg daily, Metoprolol 25  mg (75 mg total-take 2 tablets in the a.m. and 1 qhs), ramipril 10 mg daily.      Meds ordered this encounter  Medications   donepezil (ARICEPT) 5 MG tablet    Sig: Take 2 tablets (10 mg total) by mouth at bedtime.    Dispense:  30 tablet    Refill:  0    Orders Placed This Encounter  Procedures   CBC with Differential/Platelet   Comprehensive metabolic panel   TSH   123456 and Folate Panel   Methylmalonic acid, serum  Follow-up: No follow-ups on file.   I,Katherina A Bramblett,acting as a scribe for Rochel Brome, MD.,have documented all relevant documentation on the behalf of Rochel Brome, MD,as directed by  Rochel Brome, MD while in the presence of Rochel Brome, MD.   An After Visit Summary was printed and given to the patient.  Rochel Brome, MD [facility] [phone]

## 2022-07-05 ENCOUNTER — Telehealth: Payer: Self-pay

## 2022-07-05 NOTE — Progress Notes (Signed)
Care Management & Coordination Services Pharmacy Team  Reason for Encounter: Appointment Reminder  Contacted patient to confirm telephone appointment with Arizona Constable, PharmD on 07/09/22 at 11:00 am.  Spoke with patient on 07/05/2022   Do you have any problems getting your medications? No  What is your top health concern you would like to discuss at your upcoming visit? No top concerns   Have you seen any other providers since your last visit with PCP? No   Chart review:  Recent office visits:  07/04/22 Rochel Brome MD. Seen for routine visit. Started on Donepezil HCI 10mg .   05/21/22 Rochel Brome MD. Orders only. Recommend lipitor 20 mg daily.   03/27/22 Reinaldo Meeker MD. Seen for Dementia. Started on Mirtazapine 7.5mg .   Recent consult visits:  05/13/22 (Podiatry) Acquanetta Sit DPM. Seen for follow up. No med changes.   04/26/22 (Cardiology) Minus Breeding MD. Seen for CAD. No med changes.   01/21/22 (Podiatry) Acquanetta Sit DPM. Seen for nail problem. No med changes.   Hospital visits:  None  Star Rating Drugs:  Medication:  Last Fill: Day Supply Ramipril   04/17/22-01/19/22 90ds  Care Gaps: Annual wellness visit in last year? Yes  If Diabetic:None noted  Last eye exam / retinopathy screening: Last diabetic foot exam:   Elray Mcgregor, [employer] Pharmacist Assistant  [phone]

## 2022-07-07 ENCOUNTER — Encounter: Payer: Self-pay | Admitting: Family Medicine

## 2022-07-07 NOTE — Assessment & Plan Note (Signed)
Statins are not indicated due to progressed dementia.

## 2022-07-07 NOTE — Assessment & Plan Note (Signed)
Start aricept 5 mg before bed.

## 2022-07-07 NOTE — Assessment & Plan Note (Signed)
Continue aspirin 81 mg daily, brilinta 90 mg twice daily, ramipril 10 mg daily, metoprolol xl 25 mg 3 tablets daily. Has ntg.

## 2022-07-07 NOTE — Assessment & Plan Note (Signed)
The current medical regimen is effective;  continue present plan and medications. 

## 2022-07-07 NOTE — Assessment & Plan Note (Signed)
The current medical regimen is effective;  continue present plan and medications. Continue Norvasc 5 mg daily, Metoprolol 25 mg (75 mg total-take 2 tablets in the a.m. and 1 qhs), ramipril 10 mg daily.

## 2022-07-09 LAB — CBC WITH DIFFERENTIAL/PLATELET
Basophils Absolute: 0.1 10*3/uL (ref 0.0–0.2)
Basos: 1 %
EOS (ABSOLUTE): 0.2 10*3/uL (ref 0.0–0.4)
Eos: 2 %
Hematocrit: 30.9 % — ABNORMAL LOW (ref 34.0–46.6)
Hemoglobin: 9.8 g/dL — ABNORMAL LOW (ref 11.1–15.9)
Immature Grans (Abs): 0 10*3/uL (ref 0.0–0.1)
Immature Granulocytes: 0 %
Lymphocytes Absolute: 2.6 10*3/uL (ref 0.7–3.1)
Lymphs: 28 %
MCH: 30.7 pg (ref 26.6–33.0)
MCHC: 31.7 g/dL (ref 31.5–35.7)
MCV: 97 fL (ref 79–97)
Monocytes Absolute: 0.7 10*3/uL (ref 0.1–0.9)
Monocytes: 8 %
Neutrophils Absolute: 5.6 10*3/uL (ref 1.4–7.0)
Neutrophils: 61 %
Platelets: 226 10*3/uL (ref 150–450)
RBC: 3.19 x10E6/uL — ABNORMAL LOW (ref 3.77–5.28)
RDW: 12.2 % (ref 11.7–15.4)
WBC: 9.2 10*3/uL (ref 3.4–10.8)

## 2022-07-09 LAB — COMPREHENSIVE METABOLIC PANEL
ALT: 7 IU/L (ref 0–32)
AST: 16 IU/L (ref 0–40)
Albumin/Globulin Ratio: 1.3 (ref 1.2–2.2)
Albumin: 4.3 g/dL (ref 3.6–4.6)
Alkaline Phosphatase: 155 IU/L — ABNORMAL HIGH (ref 44–121)
BUN/Creatinine Ratio: 18 (ref 12–28)
BUN: 35 mg/dL (ref 10–36)
Bilirubin Total: 0.3 mg/dL (ref 0.0–1.2)
CO2: 21 mmol/L (ref 20–29)
Calcium: 11.3 mg/dL — ABNORMAL HIGH (ref 8.7–10.3)
Chloride: 106 mmol/L (ref 96–106)
Creatinine, Ser: 1.93 mg/dL — ABNORMAL HIGH (ref 0.57–1.00)
Globulin, Total: 3.2 g/dL (ref 1.5–4.5)
Glucose: 80 mg/dL (ref 70–99)
Potassium: 6.1 mmol/L (ref 3.5–5.2)
Sodium: 143 mmol/L (ref 134–144)
Total Protein: 7.5 g/dL (ref 6.0–8.5)
eGFR: 24 mL/min/{1.73_m2} — ABNORMAL LOW (ref >59)

## 2022-07-09 LAB — METHYLMALONIC ACID, SERUM: Methylmalonic Acid: 216 nmol/L (ref 0–378)

## 2022-07-09 LAB — B12 AND FOLATE PANEL
Folate: >20 ng/mL (ref >3.0)
Vitamin B-12: >2000 pg/mL — ABNORMAL HIGH (ref 232–1245)

## 2022-07-09 LAB — TSH: TSH: 0.653 u[IU]/mL (ref 0.450–4.500)

## 2022-07-09 NOTE — Progress Notes (Signed)
Potassium quite high. Ask if patient is taking any potassium supplements.  Needs stat potassium.  Recommend stop ramipril. Start on losartan 25 mg daily.  Kidney function abnormal, but stable Anemia slightly worse. B12, folate, mma, tsh normal.

## 2022-07-10 ENCOUNTER — Other Ambulatory Visit: Payer: Self-pay

## 2022-07-11 ENCOUNTER — Other Ambulatory Visit: Payer: Self-pay | Admitting: Family Medicine

## 2022-07-11 ENCOUNTER — Other Ambulatory Visit: Payer: Self-pay

## 2022-07-11 ENCOUNTER — Other Ambulatory Visit: Payer: Medicare Other

## 2022-07-11 DIAGNOSIS — E875 Hyperkalemia: Secondary | ICD-10-CM | POA: Diagnosis not present

## 2022-07-11 DIAGNOSIS — F0280 Dementia in other diseases classified elsewhere without behavioral disturbance: Secondary | ICD-10-CM

## 2022-07-11 MED ORDER — DONEPEZIL HCL 10 MG PO TABS: 10.0000 mg | ORAL_TABLET | Freq: Every day | ORAL | 1 refills | Status: DC

## 2022-07-12 LAB — COMPREHENSIVE METABOLIC PANEL
ALT: 9 IU/L (ref 0–32)
AST: 18 IU/L (ref 0–40)
Albumin/Globulin Ratio: 1.2 (ref 1.2–2.2)
Albumin: 4.1 g/dL (ref 3.6–4.6)
Alkaline Phosphatase: 152 IU/L — ABNORMAL HIGH (ref 44–121)
BUN/Creatinine Ratio: 17 (ref 12–28)
BUN: 32 mg/dL (ref 10–36)
Bilirubin Total: <0.2 mg/dL (ref 0.0–1.2)
CO2: 22 mmol/L (ref 20–29)
Calcium: 10.7 mg/dL — ABNORMAL HIGH (ref 8.7–10.3)
Chloride: 103 mmol/L (ref 96–106)
Creatinine, Ser: 1.88 mg/dL — ABNORMAL HIGH (ref 0.57–1.00)
Globulin, Total: 3.3 g/dL (ref 1.5–4.5)
Glucose: 88 mg/dL (ref 70–99)
Potassium: 4.5 mmol/L (ref 3.5–5.2)
Sodium: 140 mmol/L (ref 134–144)
Total Protein: 7.4 g/dL (ref 6.0–8.5)
eGFR: 24 mL/min/{1.73_m2} — ABNORMAL LOW (ref >59)

## 2022-07-15 ENCOUNTER — Telehealth: Payer: Self-pay

## 2022-07-15 ENCOUNTER — Other Ambulatory Visit: Payer: Self-pay

## 2022-07-15 MED ORDER — TRIAMCINOLONE ACETONIDE 0.1 % EX CREA: 1.0000 | TOPICAL_CREAM | Freq: Two times a day (BID) | CUTANEOUS | 1 refills | Status: DC

## 2022-07-15 NOTE — Telephone Encounter (Signed)
Patient daughter called and stated that since patient has started donepezil 10 mg she has not been sleeping well, and is starting to talk about different things such as: "This is not her house she is living in" and she has not did this in a while. Please advise  Also stated that [facility] will be calling the office to see about getting an alterative medication for the donepezil.

## 2022-07-15 NOTE — Telephone Encounter (Signed)
Bryson Ha from [facility] called requesting a referral on [name].  Her family had reached out to them for help.  Dr. Tobie Poet approved the evaluation for possible admission if she qualifies.

## 2022-07-15 NOTE — Telephone Encounter (Signed)
Patient' daughter (Dr. Zenaida Niece)  Made Aware, Verbalized Understanding.

## 2022-07-25 ENCOUNTER — Other Ambulatory Visit: Payer: Self-pay | Admitting: Cardiology

## 2022-08-06 ENCOUNTER — Telehealth: Payer: Self-pay

## 2022-08-06 NOTE — Progress Notes (Signed)
Care Management & Coordination Services Pharmacy Team  Reason for Encounter: General adherence update   Contacted patient for general health update and medication adherence call.  Spoke with patient on 08/06/2022    What concerns do you have about your medications? I tried reaching out to granddaughter but no success. I did talk with [name] today and she seemed in good spirits and felt like everything was going well. She had no complaints at this time but wanted to check in with family to see how everything was going. Unable to reach at this time.   The patient denies side effects with their medications.   How often do you forget or accidentally Cindy a dose? Never  Do you use a pillbox? No  Are you having any problems getting your medications from your pharmacy? No  Counseled patient on: [place] job taking medications   Chart Updates:  Recent office visits:  07/15/22 Blane Ohara MD. Telephone message. Stop aricept.   Recent consult visits:  None  Hospital visits:  None  Medications: Outpatient Encounter Medications as of 08/06/2022  Medication Sig   amLODipine (NORVASC) 5 MG tablet TAKE 1 TABLET (5 MG TOTAL) BY MOUTH DAILY.   Ascorbic Acid (VITAMIN C) 100 MG tablet Take by mouth daily.   aspirin EC 81 MG tablet Take 1 tablet (81 mg total) by mouth daily. Swallow whole.   b complex vitamins tablet Take 1 tablet by mouth daily.   brimonidine (ALPHAGAN P) 0.1 % SOLN 1 drop into affected eye   cholecalciferol (VITAMIN D3) 25 MCG (1000 UNIT) tablet Take by mouth daily.   cinacalcet (SENSIPAR) 30 MG tablet Take 30 mg by mouth 2 (two) times daily.   Coenzyme Q10 (COQ10) 50 MG CAPS Take 50 mg by mouth daily.   donepezil (ARICEPT) 10 MG tablet Take 1 tablet (10 mg total) by mouth at bedtime.   dorzolamide-timolol (COSOPT) 22.3-6.8 MG/ML ophthalmic solution 1 drop at bedtime.   ELDERBERRY PO Take by mouth daily.   famotidine (PEPCID) 40 MG tablet TAKE 1 TABLET BY MOUTH EVERY DAY    losartan (COZAAR) 25 MG tablet Take 25 mg by mouth daily.   MAGNESIUM GLUCONATE PO Take by mouth daily.   metoprolol succinate (TOPROL-XL) 25 MG 24 hr tablet Take 3 tablets (75 mg total) by mouth daily.   mirtazapine (REMERON) 7.5 MG tablet TAKE 1 TABLET BY MOUTH AT BEDTIME.   nitroGLYCERIN (NITROSTAT) 0.4 MG SL tablet Place 1 tablet (0.4 mg total) under the tongue every 5 (five) minutes as needed. For chest pain   ticagrelor (BRILINTA) 90 MG TABS tablet TAKE 1 TABLET BY MOUTH TWICE A DAY   traMADol (ULTRAM) 50 MG tablet Take 2 tablets (100 mg total) by mouth daily.   triamcinolone cream (KENALOG) 0.1 % Apply 1 Application topically 2 (two) times daily. Both hands   [DISCONTINUED] simvastatin (ZOCOR) 40 MG tablet Take 1 tablet (40 mg total) by mouth at bedtime.   No facility-administered encounter medications on file as of 08/06/2022.    Recent vitals BP Readings from Last 3 Encounters:  07/04/22 120/68  04/26/22 108/62  03/27/22 122/60   Pulse Readings from Last 3 Encounters:  07/04/22 61  04/26/22 (!) 57  03/27/22 76   Wt Readings from Last 3 Encounters:  07/04/22 89 lb 12.8 oz (40.7 kg)  04/26/22 92 lb 6.4 oz (41.9 kg)  03/27/22 95 lb 12.8 oz (43.5 kg)   BMI Readings from Last 3 Encounters:  07/04/22 16.42 kg/m  04/26/22  16.90 kg/m  03/27/22 17.52 kg/m    Recent lab results    Component Value Date/Time   NA 140 07/11/2022 1608   K 4.5 07/11/2022 1608   CL 103 07/11/2022 1608   CO2 22 07/11/2022 1608   GLUCOSE 88 07/11/2022 1608   GLUCOSE 99 08/01/2021 1427   BUN 32 07/11/2022 1608   CREATININE 1.88 (H) 07/11/2022 1608   CALCIUM 10.7 (H) 07/11/2022 1608   CALCIUM 12.1 (H) 03/30/2018 0102    Lab Results  Component Value Date   CREATININE 1.88 (H) 07/11/2022   EGFR 24 (L) 07/11/2022   GFRNONAA 31 (L) 08/01/2021   GFRAA 42 (L) 05/04/2020   Lab Results  Component Value Date/Time   HGBA1C 5.9 (H) 08/26/2011 06:25 AM    Lab Results  Component Value Date    CHOL 319 (A) 05/21/2022   HDL 59 05/21/2022   LDLCALC 215 05/21/2022   TRIG 227 (A) 05/21/2022   CHOLHDL 5.3 (H) 03/27/2022    Care Gaps: Annual wellness visit in last year? Yes  If Diabetic:None noted  Last eye exam / retinopathy screening: Last diabetic foot exam: Last UACR:   Star Rating Drugs:  Medication:  Last Fill: Day Supply Losartan  No fill hx  Roxana Hires, CMA Clinical Pharmacist Assistant  [phone]

## 2022-08-27 ENCOUNTER — Ambulatory Visit (INDEPENDENT_AMBULATORY_CARE_PROVIDER_SITE_OTHER): Payer: Medicare Other | Admitting: Podiatry

## 2022-08-27 DIAGNOSIS — I739 Peripheral vascular disease, unspecified: Secondary | ICD-10-CM | POA: Diagnosis not present

## 2022-08-27 DIAGNOSIS — M79675 Pain in left toe(s): Secondary | ICD-10-CM | POA: Diagnosis not present

## 2022-08-27 DIAGNOSIS — B351 Tinea unguium: Secondary | ICD-10-CM

## 2022-08-27 DIAGNOSIS — M79674 Pain in right toe(s): Secondary | ICD-10-CM | POA: Diagnosis not present

## 2022-08-27 NOTE — Progress Notes (Signed)
  Subjective:  Patient ID: [name], female    DOB: April 10, 1926,  MRN: [id number]  [name] presents to clinic today for at risk foot care. Patient has h/o PAD and painful elongated mycotic toenails 1-5 bilaterally which are tender when wearing enclosed shoe gear. Pain is relieved with periodic professional debridement.  Chief Complaint  Patient presents with   Nail Problem    [facility] PCP-[name] PCP VST-06/2022   New problem(s): None.   PCP is [name], Kirsten, MD.  Allergies  Allergen Reactions   Diphenhydramine Hcl Other (See Comments)    unknown   Penicillins Other (See Comments)    unknown   Shellfish Allergy Other (See Comments)    unknown   Statins     Myalgia.    Review of Systems: Negative except as noted in the HPI.  Objective:  There were no vitals filed for this visit. [name] is a pleasant 89 y.o. female thin build in NAD. AAO x 3.  Vascular Examination: CFT <3 seconds b/l. Faintly palpable DP pulses b/l LE. Diminished PT pulse(s) b/l LE.. Skin temperature gradient warm to warm b/l. No pain with calf compression. No ischemia or gangrene. No cyanosis or clubbing noted b/l. Pedal hair absent. No edema noted b/l LE.   Neurological Examination: Sensation grossly intact b/l with 10 gram monofilament. Vibratory sensation intact b/l.   Dermatological Examination: Pedal skin warm and supple b/l. Toenails 1-5 b/l thick, discolored, elongated with subungual debris and pain on dorsal palpation.  No open wounds b/l LE. No interdigital macerations noted b/l LE.  Blanchable erythema noted medial bunion area of both feet and distal tip of left hallux. No edema, no warmth.  Musculoskeletal Examination: Muscle strength 5/5 to b/l LE. No pain, crepitus or joint limitation noted with ROM bilateral LE. HAV with bunion deformity noted b/l LE.  Radiographs: None  Assessment/Plan: 1. Pain due to onychomycosis of toenails of both feet   2. Peripheral vascular disease  (HCC)    -Consent given for treatment as described below: -Examined patient. -Patient to continue soft, supportive shoe gear daily. -Mycotic toenails 1-5 bilaterally were debrided in length and girth with sterile nail nippers and dremel without incident. -Patient/POA to call should there be question/concern in the interim.   Return in about 3 months (around 11/27/2022).  Freddie Breech, DPM

## 2022-08-28 ENCOUNTER — Other Ambulatory Visit: Payer: Self-pay

## 2022-08-28 MED ORDER — TRIAMCINOLONE ACETONIDE 0.1 % EX CREA: 1.0000 | TOPICAL_CREAM | Freq: Two times a day (BID) | CUTANEOUS | 1 refills | Status: DC

## 2022-08-31 ENCOUNTER — Encounter: Payer: Self-pay | Admitting: Podiatry

## 2022-09-02 DIAGNOSIS — N390 Urinary tract infection, site not specified: Secondary | ICD-10-CM | POA: Diagnosis not present

## 2022-09-02 DIAGNOSIS — N184 Chronic kidney disease, stage 4 (severe): Secondary | ICD-10-CM | POA: Diagnosis not present

## 2022-09-10 ENCOUNTER — Other Ambulatory Visit: Payer: Self-pay

## 2022-09-10 DIAGNOSIS — R451 Restlessness and agitation: Secondary | ICD-10-CM

## 2022-09-10 MED ORDER — MIRTAZAPINE 7.5 MG PO TABS: 7.5000 mg | ORAL_TABLET | Freq: Every day | ORAL | 2 refills | Status: DC

## 2022-09-11 DIAGNOSIS — N184 Chronic kidney disease, stage 4 (severe): Secondary | ICD-10-CM | POA: Diagnosis not present

## 2022-09-11 DIAGNOSIS — E213 Hyperparathyroidism, unspecified: Secondary | ICD-10-CM | POA: Diagnosis not present

## 2022-09-11 DIAGNOSIS — I129 Hypertensive chronic kidney disease with stage 1 through stage 4 chronic kidney disease, or unspecified chronic kidney disease: Secondary | ICD-10-CM | POA: Diagnosis not present

## 2022-09-11 DIAGNOSIS — D631 Anemia in chronic kidney disease: Secondary | ICD-10-CM | POA: Diagnosis not present

## 2022-09-25 ENCOUNTER — Other Ambulatory Visit: Payer: Self-pay | Admitting: Cardiology

## 2022-10-03 ENCOUNTER — Other Ambulatory Visit: Payer: Self-pay

## 2022-10-03 MED ORDER — AMLODIPINE BESYLATE 5 MG PO TABS: 5.0000 mg | ORAL_TABLET | Freq: Every day | ORAL | 1 refills | Status: DC

## 2022-10-07 DIAGNOSIS — H401133 Primary open-angle glaucoma, bilateral, severe stage: Secondary | ICD-10-CM | POA: Diagnosis not present

## 2022-10-07 NOTE — Assessment & Plan Note (Signed)
The current medical regimen is effective;  continue present plan and medications. Continue Norvasc 5 mg daily, Metoprolol 25 mg (75 mg total-take 2 tablets in the a.m. and 1 qhs), ramipril 10 mg daily. 

## 2022-10-07 NOTE — Assessment & Plan Note (Signed)
Continue aspirin 81 mg daily, brilinta 90 mg twice daily, ramipril 10 mg daily, metoprolol xl 25 mg 3 tablets daily. Has ntg. 

## 2022-10-07 NOTE — Assessment & Plan Note (Signed)
Statins are not indicated due to progressed dementia.  

## 2022-10-07 NOTE — Progress Notes (Signed)
Subjective:  Patient ID: [name], female    DOB: 24-Oct-1925  Age: 89 y.o. MRN: [id number]  Chief Complaint  Patient presents with   Medical Management of Chronic Issues    HPI Hypertensive heart disease: Norvasc 5 mg daily, Metoprolol 25 mg (75 mg total-take 2 tablets in the a.m. and 1 qhs). HX of MI, CAD, PVD. Has some swelling in his feet (Left > right.) Brilinta 90 mg twice daily.  Hyperlipidemia: patient is not taking zocor 40 mg daily. Patient has dementia.  GERD: Pepcid 40 mg daily   Dementia/Depression: Remeron 7.5 mg qhs   Alzheimer Disease: very poor memory.      10/10/2022    2:15 PM 07/04/2022    2:40 PM 08/22/2021    3:07 PM 12/05/2020    3:02 PM 10/06/2019    2:57 PM  Depression screen PHQ 2/9  Decreased Interest 1 0 0 0 1  Down, Depressed, Hopeless 1 0 0 0 0  PHQ - 2 Score 2 0 0 0 1  Altered sleeping 0      Tired, decreased energy 0      Change in appetite 0      Feeling bad or failure about yourself  0      Trouble concentrating 0      Moving slowly or fidgety/restless 0      Suicidal thoughts 0      PHQ-9 Score 2      Difficult doing work/chores Somewhat difficult            10/10/2022    2:16 PM  Fall Risk   Falls in the past year? 0  Number falls in past yr: 0  Injury with Fall? 0  Risk for fall due to : Impaired balance/gait  Follow up Falls evaluation completed;Education provided    Patient Care Team: Blane Ohara, MD as PCP - General (Family Medicine) Rollene Rotunda, MD as PCP - Cardiology (Cardiology) Zettie Pho, [facility] (Inactive) (Pharmacist) Anthony Sar, MD as Consulting Physician (Nephrology) Talmage Coin, MD as Consulting Physician (Endocrinology)   Review of Systems  Constitutional:  Negative for chills, fatigue and fever.  HENT:  Negative for congestion, ear pain, rhinorrhea and sore throat.   Respiratory:  Negative for cough and shortness of breath.   Cardiovascular:  Negative for chest pain.  Gastrointestinal:   Negative for abdominal pain, constipation, diarrhea, nausea and vomiting.  Genitourinary:  Negative for dysuria and urgency.  Musculoskeletal:  Negative for back pain and myalgias.  Neurological:  Negative for dizziness, weakness, light-headedness and headaches.  Psychiatric/Behavioral:  Negative for dysphoric mood. The patient is not nervous/anxious.     Current Outpatient Medications on File Prior to Visit  Medication Sig Dispense Refill   amLODipine (NORVASC) 5 MG tablet Take 1 tablet (5 mg total) by mouth daily. 90 tablet 1   Ascorbic Acid (VITAMIN C) 100 MG tablet Take by mouth daily.     aspirin EC 81 MG tablet Take 1 tablet (81 mg total) by mouth daily. Swallow whole. 90 tablet 3   b complex vitamins tablet Take 1 tablet by mouth daily.     cinacalcet (SENSIPAR) 30 MG tablet Take 30 mg by mouth 2 (two) times daily.     Coenzyme Q10 (COQ10) 50 MG CAPS Take 50 mg by mouth daily.     ELDERBERRY PO Take by mouth daily.     famotidine (PEPCID) 40 MG tablet TAKE 1 TABLET BY MOUTH EVERY DAY 90 tablet 2  MAGNESIUM GLUCONATE PO Take by mouth daily.     metoprolol succinate (TOPROL-XL) 25 MG 24 hr tablet TAKE 3 TABLETS BY MOUTH DAILY 270 tablet 1   mirtazapine (REMERON) 7.5 MG tablet Take 1 tablet (7.5 mg total) by mouth at bedtime. 90 tablet 2   nitroGLYCERIN (NITROSTAT) 0.4 MG SL tablet Place 1 tablet (0.4 mg total) under the tongue every 5 (five) minutes as needed. For chest pain 25 tablet 2   ticagrelor (BRILINTA) 90 MG TABS tablet TAKE 1 TABLET BY MOUTH TWICE A DAY 180 tablet 2   traMADol (ULTRAM) 50 MG tablet Take 2 tablets (100 mg total) by mouth daily. 60 tablet 3   [DISCONTINUED] simvastatin (ZOCOR) 40 MG tablet Take 1 tablet (40 mg total) by mouth at bedtime. 30 tablet 0   No current facility-administered medications on file prior to visit.   Past Medical History:  Diagnosis Date   Abscess 2003   I&D SCM abscess   Acute ischemic colitis (HCC) 04/10/2011   acute episode of  ischemic colitis, with abd pain, CT evidence of transverse colitis, and blood streaked BM.  Resolved with conservative treatment.   Arthritis    "hands, joints, knees" (07/19/2016)   Bacteremia 2003   hx of staph bacteremia   CAD (coronary artery disease) 2003, 2010   s/p stenting of RCA x2, s/p stent to OM;  LHC 04/2009 in the setting of a NSTEMI: EF 65%, RCA stent patent with 60% ISR proximal and 40% ISR mid, PLV proximal 50-60% and distal 50%, ostial circumflex 30-40%, proximal OM mild in-stent restenosis with a branch with 90+ percent ostial stenosis, proximal LAD 30-40%.  FFR of ostial circumflex:  0.96 - not flow limiting.  Medical therapy was continued.   Chronic lower back pain    "not a big problem" (07/19/2016)   Closed displaced fracture of distal phalanx of left middle finger 05/23/2017   Degenerative disc disease    Finger infection    GERD (gastroesophageal reflux disease)    History of medication noncompliance    concerning Plavix   HTN (hypertension)    Echocardiogram 08/26/11: Mild focal basal septal hypertrophy, EF 55-60%, grade 2 diastolic dysfunction, mild MR.  There was a fluid-filled area likely in the location of the liver noted   Hyperlipidemia    Hyperparathyroidism 2003   parathyroid adenoma localized to the right inferior thyroid lobe region   Hypothyroidism    Left renal artery stenosis (HCC)    40%   Lumbar stenosis with neurogenic claudication 2006   s/p decompression L2-L5   Myocardial infarction (HCC) ~ 2013; 05/06/2016   Osteoporosis 2009   diagnosed by bone density scan in 2009, on Vit D only, no bisphosphonate therapy   Phlebitis    LLE   PVD (peripheral vascular disease) (HCC)    s/p left fem-pop bypass graft   TIA (transient ischemic attack)    carotid dopplers 5/13: no ICA stenosis   Past Surgical History:  Procedure Laterality Date   AMPUTATION FINGER Left 05/24/2017   Procedure: AMPUTATION tip of long FINGER;  Surgeon: Knute Neu, MD;   Location: [facility] OR;  Service: Plastics;  Laterality: Left;   CARDIAC CATHETERIZATION N/A 05/06/2016   Procedure: Left Heart Cath and Coronary Angiography;  Surgeon: Runell Gess, MD;  Location: [facility] INVASIVE CV LAB;  Service: Cardiovascular;  Laterality: N/A;   CARDIAC CATHETERIZATION N/A 05/06/2016   Procedure: Coronary Stent Intervention;  Surgeon: Runell Gess, MD;  Location: [facility] INVASIVE CV LAB;  Service: Cardiovascular;  Laterality: N/A;  RCA   CATARACT EXTRACTION, BILATERAL Bilateral    CORONARY ANGIOPLASTY WITH STENT PLACEMENT  09/2008   CORONARY ANGIOPLASTY WITH STENT PLACEMENT  04/2001   ENDOSCOPIC HEMILAMINOTOMY W/ DISCECTOMY LUMBAR  06/2004   L3, L4 lami; L2, L5 hemilami; decompresion L2-3, 3-4, 4-   FEMORAL BYPASS  before 2003   LLE   I & D EXTREMITY Left 05/24/2017   Procedure: IRRIGATION AND DEBRIDEMENT LEFT LONG FINGER;  Surgeon: Knute Neu, MD;  Location: [facility] OR;  Service: Plastics;  Laterality: Left;   INCISION AND DRAINAGE ANTERIOR NECK  10/2001   absces sternoclavicular joint    Family History  Problem Relation Age of Onset   Stroke Mother    Stroke Father    Breast cancer Daughter    Social History   Socioeconomic History   Marital status: Widowed    Spouse name: Not on file   Number of children: Not on file   Years of education: Not on file   Highest education level: Not on file  Occupational History   Not on file  Tobacco Use   Smoking status: Never   Smokeless tobacco: Never  Vaping Use   Vaping Use: Never used  Substance and Sexual Activity   Alcohol use: No   Drug use: No   Sexual activity: Never  Other Topics Concern   Not on file  Social History Narrative   Lives with daughter and granddaughter   Social Determinants of Health   Financial Resource Strain: Low Risk  (10/10/2022)   Overall Financial Resource Strain (CARDIA)    Difficulty of Paying Living Expenses: Not hard at all  Food Insecurity: No Food Insecurity (10/10/2022)   Hunger Vital  Sign    Worried About Running Out of Food in the Last Year: Never true    Ran Out of Food in the Last Year: Never true  Transportation Needs: No Transportation Needs (10/10/2022)   PRAPARE - Administrator, Civil Service (Medical): No    Lack of Transportation (Non-Medical): No  Physical Activity: Inactive (10/10/2022)   Exercise Vital Sign    Days of Exercise per Week: 0 days    Minutes of Exercise per Session: 0 min  Stress: No Stress Concern Present (10/10/2022)   [employer] of Occupational Health - Occupational Stress Questionnaire    Feeling of Stress : Not at all  Social Connections: Moderately Isolated (08/22/2021)   Social Connection and Isolation Panel [NHANES]    Frequency of Communication with Friends and Family: More than three times a week    Frequency of Social Gatherings with Friends and Family: More than three times a week    Attends Religious Services: More than 4 times per year    Active Member of [employer] or Organizations: No    Attends Banker Meetings: Never    Marital Status: Widowed    Objective:  BP 112/62 (BP Location: Left Arm, Patient Position: Sitting)   Pulse 60   Temp 97.8 F (36.6 C) (Temporal)   Ht 5\' 2"  (1.575 m)   Wt 89 lb 12.8 oz (40.7 kg)   SpO2 98%   BMI 16.42 kg/m      10/10/2022    2:30 PM 10/10/2022    2:06 PM 07/04/2022    2:29 PM  BP/Weight  Systolic BP 112 112 120  Diastolic BP 62 62 68  Wt. (Lbs) 89.8 89.8 89.8  BMI 16.42 kg/m2 16.42 kg/m2 16.42 kg/m2  Physical Exam Vitals reviewed.  Constitutional:      Appearance: Normal appearance. She is normal weight.  Neck:     Vascular: No carotid bruit.  Cardiovascular:     Rate and Rhythm: Normal rate and regular rhythm.     Heart sounds: Normal heart sounds.  Pulmonary:     Effort: Pulmonary effort is normal. No respiratory distress.     Breath sounds: Normal breath sounds.  Abdominal:     General: Abdomen is flat. Bowel sounds are normal.      Palpations: Abdomen is soft.     Tenderness: There is no abdominal tenderness.  Neurological:     Mental Status: She is alert and oriented to person, place, and time.  Psychiatric:        Mood and Affect: Mood normal.        Behavior: Behavior normal.     Diabetic Foot Exam - Simple   No data filed      Lab Results  Component Value Date   WBC 7.2 10/10/2022   HGB 10.6 (L) 10/10/2022   HCT 32.6 (L) 10/10/2022   PLT 149 (L) 10/10/2022   GLUCOSE 81 10/10/2022   CHOL 319 (H) 10/10/2022   TRIG 158 (H) 10/10/2022   HDL 78 10/10/2022   LDLCALC 212 (H) 10/10/2022   ALT 10 10/10/2022   AST 22 10/10/2022   NA 144 10/10/2022   K 4.9 10/10/2022   CL 109 (H) 10/10/2022   CREATININE 1.51 (H) 10/10/2022   BUN 35 10/10/2022   CO2 18 (L) 10/10/2022   TSH 0.653 07/04/2022   INR 1.09 06/25/2016   HGBA1C 5.9 (H) 08/26/2011      Assessment & Plan:    Alzheimer disease (HCC) Assessment & Plan: Continue aricept 5 mg before bed.    Mixed hyperlipidemia Assessment & Plan: Statins are not indicated due to progressed dementia.   Orders: -     CBC with Differential/Platelet  Gastroesophageal reflux disease without esophagitis Assessment & Plan: The current medical regimen is effective;  continue present plan and medications. Continue pepcid 40 mg daily.    Coronary artery disease involving native coronary artery of native heart without angina pectoris Assessment & Plan: Continue aspirin 81 mg daily, brilinta 90 mg twice daily, ramipril 10 mg daily, metoprolol xl 25 mg 3 tablets daily. Has ntg.  Orders: -     Lipid panel  Hypertensive heart disease with heart failure [facility]) Assessment & Plan: The current medical regimen is effective;  continue present plan and medications. Continue Norvasc 5 mg daily, Metoprolol 25 mg (75 mg total-take 2 tablets in the a.m. and 1 qhs), ramipril 10 mg daily.  Orders: -     Comprehensive metabolic panel  Other orders -     Dorzolamide  HCl-Timolol Mal; Place 2 drops into the left eye at bedtime.  Dispense: 10 mL; Refill: 0 -     [facility] CKD Program     Meds ordered this encounter  Medications   dorzolamide-timolol (COSOPT) 2-0.5 % ophthalmic solution    Sig: Place 2 drops into the left eye at bedtime.    Dispense:  10 mL    Refill:  0    Orders Placed This Encounter  Procedures   CBC with Differential/Platelet   Comprehensive metabolic panel   Lipid panel   [facility] CKD Program     Follow-up: Return in about 3 months (around 01/10/2023) for chronic, fasting.   I,Katherina A Bramblett,acting as a scribe for Blane Ohara,  MD.,have documented all relevant documentation on the behalf of Rochel Brome, MD,as directed by  Rochel Brome, MD while in the presence of Rochel Brome, MD.   An After Visit Summary was printed and given to the patient.  Rochel Brome, MD [facility] [phone]

## 2022-10-07 NOTE — Assessment & Plan Note (Signed)
The current medical regimen is effective;  continue present plan and medications. °Continue pepcid 40 mg daily.  °

## 2022-10-07 NOTE — Assessment & Plan Note (Signed)
Continue aricept 5 mg before bed.

## 2022-10-10 ENCOUNTER — Ambulatory Visit (INDEPENDENT_AMBULATORY_CARE_PROVIDER_SITE_OTHER): Payer: Medicare Other | Admitting: Family Medicine

## 2022-10-10 ENCOUNTER — Encounter: Payer: Self-pay | Admitting: Family Medicine

## 2022-10-10 ENCOUNTER — Ambulatory Visit (INDEPENDENT_AMBULATORY_CARE_PROVIDER_SITE_OTHER): Payer: Medicare Other

## 2022-10-10 VITALS — BP 112/62 | HR 60 | Resp 16 | Ht 62.0 in | Wt 89.8 lb

## 2022-10-10 VITALS — BP 112/62 | HR 60 | Temp 97.8°F | Ht 62.0 in | Wt 89.8 lb

## 2022-10-10 DIAGNOSIS — I2510 Atherosclerotic heart disease of native coronary artery without angina pectoris: Secondary | ICD-10-CM | POA: Diagnosis not present

## 2022-10-10 DIAGNOSIS — I110 Hypertensive heart disease with heart failure: Secondary | ICD-10-CM

## 2022-10-10 DIAGNOSIS — G309 Alzheimer's disease, unspecified: Secondary | ICD-10-CM | POA: Diagnosis not present

## 2022-10-10 DIAGNOSIS — E782 Mixed hyperlipidemia: Secondary | ICD-10-CM | POA: Diagnosis not present

## 2022-10-10 DIAGNOSIS — M810 Age-related osteoporosis without current pathological fracture: Secondary | ICD-10-CM | POA: Insufficient documentation

## 2022-10-10 DIAGNOSIS — K219 Gastro-esophageal reflux disease without esophagitis: Secondary | ICD-10-CM

## 2022-10-10 DIAGNOSIS — Z0000 Encounter for general adult medical examination without abnormal findings: Secondary | ICD-10-CM

## 2022-10-10 DIAGNOSIS — I509 Heart failure, unspecified: Secondary | ICD-10-CM

## 2022-10-10 DIAGNOSIS — F0280 Dementia in other diseases classified elsewhere without behavioral disturbance: Secondary | ICD-10-CM

## 2022-10-10 MED ORDER — DORZOLAMIDE HCL-TIMOLOL MAL 2-0.5 % OP SOLN: 2.0000 [drp] | Freq: Every day | OPHTHALMIC | 0 refills | Status: AC

## 2022-10-10 NOTE — Progress Notes (Signed)
Subjective:   ILA LANDOWSKI is a 89 y.o. female who presents for Medicare Annual (Subsequent) preventive examination.  This wellness visit is conducted by a nurse.  The patient's medications were reviewed and reconciled since the patient's last visit.  History details were provided by the patient and her daughter.  The history appears to be reliable.    Medical History: Patient history and Family history was reviewed  Medications, Allergies, and preventative health maintenance was reviewed and updated.   Visit Complete: In person  Cardiac Risk Factors include: advanced age (>7men, >60 women);sedentary lifestyle     Objective:    Today's Vitals   10/10/22 1406  BP: 112/62  Pulse: 60  Resp: 16  Weight: 89 lb 12.8 oz (40.7 kg)  Height: 5\' 2"  (1.575 m)  PainSc: 1   PainLoc: Shoulder   Body mass index is 16.42 kg/m.     08/22/2021    3:09 PM 08/01/2021    2:04 PM 03/30/2018    4:26 PM 03/30/2018    4:22 PM 05/23/2017   12:49 PM 07/19/2016    9:18 PM 07/19/2016    5:23 PM  Advanced Directives  Does Patient Have a Medical Advance Directive? No No Yes Yes Yes No No  Type of Chief of Staff of [employer] Power of Attorney    Does patient want to make changes to medical advance directive?   No - Patient declined      Copy of Healthcare Power of Attorney in Chart?   No - copy requested No - copy requested     Would patient like information on creating a medical advance directive? No - Patient declined No - Patient declined No - Patient declined No - Patient declined  Yes (Inpatient - patient defers creating a medical advance directive at this time)     Current Medications (verified) Outpatient Encounter Medications as of 10/10/2022  Medication Sig   amLODipine (NORVASC) 5 MG tablet Take 1 tablet (5 mg total) by mouth daily.   Ascorbic Acid (VITAMIN C) 100 MG tablet Take by mouth daily.   aspirin EC 81 MG tablet Take 1  tablet (81 mg total) by mouth daily. Swallow whole.   b complex vitamins tablet Take 1 tablet by mouth daily.   brimonidine (ALPHAGAN P) 0.1 % SOLN 1 drop into affected eye   cholecalciferol (VITAMIN D3) 25 MCG (1000 UNIT) tablet Take by mouth daily.   cinacalcet (SENSIPAR) 30 MG tablet Take 30 mg by mouth 2 (two) times daily.   Coenzyme Q10 (COQ10) 50 MG CAPS Take 50 mg by mouth daily.   donepezil (ARICEPT) 10 MG tablet Take 1 tablet (10 mg total) by mouth at bedtime.   dorzolamide-timolol (COSOPT) 22.3-6.8 MG/ML ophthalmic solution 1 drop at bedtime.   ELDERBERRY PO Take by mouth daily.   famotidine (PEPCID) 40 MG tablet TAKE 1 TABLET BY MOUTH EVERY DAY   losartan (COZAAR) 25 MG tablet Take 25 mg by mouth daily.   MAGNESIUM GLUCONATE PO Take by mouth daily.   metoprolol succinate (TOPROL-XL) 25 MG 24 hr tablet TAKE 3 TABLETS BY MOUTH DAILY   mirtazapine (REMERON) 7.5 MG tablet Take 1 tablet (7.5 mg total) by mouth at bedtime.   nitroGLYCERIN (NITROSTAT) 0.4 MG SL tablet Place 1 tablet (0.4 mg total) under the tongue every 5 (five) minutes as needed. For chest pain   ticagrelor (BRILINTA) 90 MG TABS tablet TAKE 1 TABLET BY MOUTH TWICE A  DAY   traMADol (ULTRAM) 50 MG tablet Take 2 tablets (100 mg total) by mouth daily.   triamcinolone cream (KENALOG) 0.1 % Apply 1 Application topically 2 (two) times daily. Both hands   [DISCONTINUED] simvastatin (ZOCOR) 40 MG tablet Take 1 tablet (40 mg total) by mouth at bedtime.   No facility-administered encounter medications on file as of 10/10/2022.    Allergies (verified) Diphenhydramine hcl, Penicillins, Shellfish allergy, and Statins   History: Past Medical History:  Diagnosis Date   Abscess 2003   I&D SCM abscess   Acute ischemic colitis (HCC) 04/10/2011   acute episode of ischemic colitis, with abd pain, CT evidence of transverse colitis, and blood streaked BM.  Resolved with conservative treatment.   Arthritis    "hands, joints, knees"  (07/19/2016)   Bacteremia 2003   hx of staph bacteremia   CAD (coronary artery disease) 2003, 2010   s/p stenting of RCA x2, s/p stent to OM;  LHC 04/2009 in the setting of a NSTEMI: EF 65%, RCA stent patent with 60% ISR proximal and 40% ISR mid, PLV proximal 50-60% and distal 50%, ostial circumflex 30-40%, proximal OM mild in-stent restenosis with a branch with 90+ percent ostial stenosis, proximal LAD 30-40%.  FFR of ostial circumflex:  0.96 - not flow limiting.  Medical therapy was continued.   Chronic lower back pain    "not a big problem" (07/19/2016)   Closed displaced fracture of distal phalanx of left middle finger 05/23/2017   Degenerative disc disease    Finger infection    GERD (gastroesophageal reflux disease)    History of medication noncompliance    concerning Plavix   HTN (hypertension)    Echocardiogram 08/26/11: Mild focal basal septal hypertrophy, EF 55-60%, grade 2 diastolic dysfunction, mild MR.  There was a fluid-filled area likely in the location of the liver noted   Hyperlipidemia    Hyperparathyroidism 2003   parathyroid adenoma localized to the right inferior thyroid lobe region   Hypothyroidism    Left renal artery stenosis (HCC)    40%   Lumbar stenosis with neurogenic claudication 2006   s/p decompression L2-L5   Myocardial infarction (HCC) ~ 2013; 05/06/2016   Osteoporosis 2009   diagnosed by bone density scan in 2009, on Vit D only, no bisphosphonate therapy   Phlebitis    LLE   PVD (peripheral vascular disease) (HCC)    s/p left fem-pop bypass graft   TIA (transient ischemic attack)    carotid dopplers 5/13: no ICA stenosis   Past Surgical History:  Procedure Laterality Date   AMPUTATION FINGER Left 05/24/2017   Procedure: AMPUTATION tip of long FINGER;  Surgeon: Knute Neu, MD;  Location: [facility] OR;  Service: Plastics;  Laterality: Left;   CARDIAC CATHETERIZATION N/A 05/06/2016   Procedure: Left Heart Cath and Coronary Angiography;  Surgeon: Runell Gess, MD;  Location[facility] INVASIVE CV LAB;  Service: Cardiovascular;  Laterality: N/A;   CARDIAC CATHETERIZATION N/A 05/06/2016   Procedure: Coronary Stent Intervention;  Surgeon: Runell Gess, MD;  Location: [facility] INVASIVE CV LAB;  Service: Cardiovascular;  Laterality: N/A;  RCA   CATARACT EXTRACTION, BILATERAL Bilateral    CORONARY ANGIOPLASTY WITH STENT PLACEMENT  09/2008   CORONARY ANGIOPLASTY WITH STENT PLACEMENT  04/2001   ENDOSCOPIC HEMILAMINOTOMY W/ DISCECTOMY LUMBAR  06/2004   L3, L4 lami; L2, L5 hemilami; decompresion L2-3, 3-4, 4-   FEMORAL BYPASS  before 2003   LLE   I & D EXTREMITY Left 05/24/2017  Procedure: IRRIGATION AND DEBRIDEMENT LEFT LONG FINGER;  Surgeon: Knute Neu, MD;  Location: [facility] OR;  Service: Plastics;  Laterality: Left;   INCISION AND DRAINAGE ANTERIOR NECK  10/2001   absces sternoclavicular joint   Family History  Problem Relation Age of Onset   Stroke Mother    Stroke Father    Breast cancer Daughter    Social History   Socioeconomic History   Marital status: Widowed    Spouse name: Not on file   Number of children: Not on file   Years of education: Not on file   Highest education level: Not on file  Occupational History   Not on file  Tobacco Use   Smoking status: Never   Smokeless tobacco: Never  Vaping Use   Vaping Use: Never used  Substance and Sexual Activity   Alcohol use: No   Drug use: No   Sexual activity: Never  Other Topics Concern   Not on file  Social History Narrative   Lives with daughter and granddaughter   Social Determinants of Health   Financial Resource Strain: Low Risk  (10/10/2022)   Overall Financial Resource Strain (CARDIA)    Difficulty of Paying Living Expenses: Not hard at all  Food Insecurity: No Food Insecurity (10/10/2022)   Hunger Vital Sign    Worried About Running Out of Food in the Last Year: Never true    Ran Out of Food in the Last Year: Never true  Transportation Needs: No Transportation Needs  (10/10/2022)   PRAPARE - Administrator, Civil Service (Medical): No    Lack of Transportation (Non-Medical): No  Physical Activity: Inactive (10/10/2022)   Exercise Vital Sign    Days of Exercise per Week: 0 days    Minutes of Exercise per Session: 0 min  Stress: No Stress Concern Present (10/10/2022)   [employer] of Occupational Health - Occupational Stress Questionnaire    Feeling of Stress : Not at all  Social Connections: Moderately Isolated (08/22/2021)   Social Connection and Isolation Panel [NHANES]    Frequency of Communication with Friends and Family: More than three times a week    Frequency of Social Gatherings with Friends and Family: More than three times a week    Attends Religious Services: More than 4 times per year    Active Member of [employer] or Organizations: No    Attends Banker Meetings: Never    Marital Status: Widowed    Tobacco Counseling Counseling given: Not Answered   Clinical Intake:  Pre-visit preparation completed: Yes  Pain : 0-10 Pain Score: 1  Pain Type: Chronic pain Pain Location: Shoulder Pain Orientation: Right Pain Descriptors / Indicators: Aching Pain Frequency: Intermittent     BMI - recorded: 16.42 Nutritional Status: BMI <19  Underweight Diabetes: No  How often do you need to have someone help you when you read instructions, pamphlets, or other written materials from your doctor or pharmacy?: 5 - Always  Interpreter Needed?: No      Activities of Daily Living    10/10/2022    2:17 PM  In your present state of health, do you have any difficulty performing the following activities:  Hearing? 1  Vision? 0  Difficulty concentrating or making decisions? 1  Walking or climbing stairs? 1  Dressing or bathing? 1  Doing errands, shopping? 1  Preparing Food and eating ? Y  Using the Toilet? N  In the past six months, have you accidently leaked urine?  Y  Do you have problems with loss of bowel  control? N  Managing your Medications? Y  Managing your Finances? Y  Housekeeping or managing your Housekeeping? Y    Patient Care Team: Blane Ohara, MD as PCP - General (Family Medicine) Rollene Rotunda, MD as PCP - Cardiology (Cardiology) Zettie Pho, [facility] (Inactive) (Pharmacist) Anthony Sar, MD as Consulting Physician (Nephrology) Talmage Coin, MD as Consulting Physician (Endocrinology)  Indicate any recent Medical Services you may have received from other than [facility] providers in the past year (date may be approximate).     Assessment:   This is a routine wellness examination for Latonda.  Hearing/Vision screen No results found.  Dietary issues and exercise activities discussed:     Goals Addressed             This Visit's Progress    Prevent falls       10/10/2022 AWV Goal: Fall Prevention  Over the next year, patient will decrease their risk for falls by: Using assistive devices, such as a cane or walker, as needed Identifying fall risks within their home and correcting them by: Removing throw rugs Adding handrails to stairs or ramps Removing clutter and keeping a clear pathway throughout the home Increasing light, especially at night Adding shower handles/bars Raising toilet seat Identifying potential personal risk factors for falls: Medication side effects Incontinence/urgency Vestibular dysfunction Hearing loss Musculoskeletal disorders Neurological disorders Orthostatic hypotension         Depression Screen    10/10/2022    2:15 PM 07/04/2022    2:40 PM 08/22/2021    3:07 PM 12/05/2020    3:02 PM 10/06/2019    2:57 PM  PHQ 2/9 Scores  PHQ - 2 Score 2 0 0 0 1  PHQ- 9 Score 2        Fall Risk    10/10/2022    2:16 PM 07/04/2022    2:40 PM 08/22/2021    3:10 PM 12/05/2020    3:02 PM 10/06/2019    2:50 PM  Fall Risk   Falls in the past year? 0 0 0 0 0  Number falls in past yr: 0 0 0 0 0  Injury with Fall? 0 0 0 0 0  Risk for fall due to  : Impaired balance/gait No Fall Risks;Impaired balance/gait Impaired balance/gait    Follow up Falls evaluation completed;Education provided Falls evaluation completed Falls prevention discussed      MEDICARE RISK AT HOME:  Medicare Risk at Home - 10/10/22 1417     Any stairs in or around the home? No    If so, are there any without handrails? No    Home free of loose throw rugs in walkways, pet beds, electrical cords, etc? Yes    Adequate lighting in your home to reduce risk of falls? Yes    Life alert? No    Use of a cane, walker or w/c? Yes    Grab bars in the bathroom? Yes    Shower chair or bench in shower? Yes    Elevated toilet seat or a handicapped toilet? No             TIMED UP AND GO:  Was the test performed?  Yes  Length of time to ambulate 10 feet: 12 sec Gait slow and steady with assistive device    Cognitive Function:    07/04/2022    3:02 PM  MMSE - Mini Mental State Exam  Orientation to time 0  Orientation to Place 2  Registration 3  Attention/ Calculation 0  Recall 1  Language- name 2 objects 2  Language- repeat 0  Language- follow 3 step command 3  Language- read & follow direction 1  Write a sentence 0  Copy design 0  Total score 12        10/10/2022    2:18 PM 08/22/2021    3:12 PM  6CIT Screen  What Year? 4 points 4 points  What month? 3 points 3 points  What time? 3 points 0 points  Count back from 20 0 points 0 points  Months in reverse 2 points 2 points  Repeat phrase 6 points 10 points  Total Score 18 points 19 points    Immunizations Immunization History  Administered Date(s) Administered   PFIZER Comirnaty(Gray Top)Covid-19 Tri-Sucrose Vaccine 11/13/2019, 12/04/2019   PFIZER(Purple Top)SARS-COV-2 Vaccination 12/04/2019   Tdap 08/26/2011    TDAP status: Due, Education has been provided regarding the importance of this vaccine. Advised may receive this vaccine at local pharmacy or Health Dept. Aware to provide a copy of the  vaccination record if obtained from local pharmacy or Health Dept. Verbalized acceptance and understanding.  Flu Vaccine status: Declined, Education has been provided regarding the importance of this vaccine but patient still declined. Advised may receive this vaccine at local pharmacy or Health Dept. Aware to provide a copy of the vaccination record if obtained from local pharmacy or Health Dept. Verbalized acceptance and understanding.  Pneumococcal vaccine status: Declined,  Education has been provided regarding the importance of this vaccine but patient still declined. Advised may receive this vaccine at local pharmacy or Health Dept. Aware to provide a copy of the vaccination record if obtained from local pharmacy or Health Dept. Verbalized acceptance and understanding.   Covid-19 vaccine status: Declined, Education has been provided regarding the importance of this vaccine but patient still declined. Advised may receive this vaccine at local pharmacy or Health Dept.or vaccine clinic. Aware to provide a copy of the vaccination record if obtained from local pharmacy or Health Dept. Verbalized acceptance and understanding.  Qualifies for Shingles Vaccine? Yes   Zostavax completed No   Shingrix Completed?: No.    Education has been provided regarding the importance of this vaccine. Patient has been advised to call insurance company to determine out of pocket expense if they have not yet received this vaccine. Advised may also receive vaccine at local pharmacy or Health Dept. Verbalized acceptance and understanding.  Screening Tests Health Maintenance  Topic Date Due   DTaP/Tdap/Td (2 - Td or Tdap) 08/25/2021   COVID-19 Vaccine (4 - 2023-24 season) 12/14/2021   Pneumonia Vaccine 83+ Years old (1 of 1 - PCV) 10/10/2023 (Originally 02/03/1991)   INFLUENZA VACCINE  11/14/2022   Medicare Annual Wellness (AWV)  10/10/2023   DEXA SCAN  Completed   HPV VACCINES  Aged Out   Zoster Vaccines- Shingrix   Discontinued    Health Maintenance  Health Maintenance Due  Topic Date Due   DTaP/Tdap/Td (2 - Td or Tdap) 08/25/2021   COVID-19 Vaccine (4 - 2023-24 season) 12/14/2021    Colorectal cancer screening: No longer required.   Mammogram status: No longer required.  Bone Density status: Completed 2015. Results reflect: Bone density results: OSTEOPOROSIS.   Lung Cancer Screening: (Low Dose CT Chest recommended if Age 48-80 years, 20 pack-year currently smoking OR have quit w/in 15years.) does not qualify.   Lung Cancer Screening Referral: N/A  Additional Screening:  Vision  Screening: Recommended annual ophthalmology exams for early detection of glaucoma and other disorders of the eye. Is the patient up to date with their annual eye exam?  Yes   Dental Screening: Recommended annual dental exams for proper oral hygiene  Community Resource Referral / Chronic Care Management: CRR required this visit?  No   CCM required this visit?  No     Plan:    1- Discussed chair exercises 2- Counseling was provided today regarding the following topics: healthy eating habits, home safety, vitamin and mineral supplementation (calcium and Vit D), regular exercise, breast self-exam, tobacco avoidance, limitation of alcohol intake, use of seat belts, firearm safety, and fall prevention.  Annual recommendations include: influenza vaccine, dental cleanings, and eye exams.   I have personally reviewed and noted the following in the patient's chart:   Medical and social history Use of alcohol, tobacco or illicit drugs  Current medications and supplements including opioid prescriptions. Patient is not currently taking opioid prescriptions. Functional ability and status Nutritional status Physical activity Advanced directives List of other physicians Hospitalizations, surgeries, and ER visits in previous 12 months Vitals Screenings to include cognitive, depression, and falls Referrals and  appointments  In addition, I have reviewed and discussed with patient certain preventive protocols, quality metrics, and best practice recommendations. A written personalized care plan for preventive services as well as general preventive health recommendations were provided to patient.     Jacklynn Bue, LPN   07/22/8117

## 2022-10-10 NOTE — Patient Instructions (Signed)
[name] , Thank you for taking time to come for your Medicare Wellness Visit. I appreciate your ongoing commitment to your health goals. Please review the following plan we discussed and let me know if I can assist you in the future.   These are the goals we discussed:  Goals      Prevent falls     10/10/2022 AWV Goal: Fall Prevention  Over the next year, patient will decrease their risk for falls by: Using assistive devices, such as a cane or walker, as needed Identifying fall risks within their home and correcting them by: Removing throw rugs Adding handrails to stairs or ramps Removing clutter and keeping a clear pathway throughout the home Increasing light, especially at night Adding shower handles/bars Raising toilet seat Identifying potential personal risk factors for falls: Medication side effects Incontinence/urgency Vestibular dysfunction Hearing loss Musculoskeletal disorders Neurological disorders Orthostatic hypotension          This is a list of the screening recommended for you and due dates:  Health Maintenance  Topic Date Due   DTaP/Tdap/Td vaccine (2 - Td or Tdap) 08/25/2021   COVID-19 Vaccine (4 - 2023-24 season) 12/14/2021   Pneumonia Vaccine (1 of 1 - PCV) 10/10/2023*   Flu Shot  11/14/2022   Medicare Annual Wellness Visit  10/10/2023   DEXA scan (bone density measurement)  Completed   HPV Vaccine  Aged Out   Zoster (Shingles) Vaccine  Discontinued  *Topic was postponed. The date shown is not the original due date.     Preventive Care 47 Years and Older, Female Preventive care refers to lifestyle choices and visits with your health care provider that can promote health and wellness. What does preventive care include? A yearly physical exam. This is also called an annual well check. Dental exams once or twice a year. Routine eye exams. Ask your health care provider how often you should have your eyes checked. Personal lifestyle choices,  including: Daily care of your teeth and gums. Regular physical activity. Eating a healthy diet. Avoiding tobacco and drug use. Limiting alcohol use. Practicing safe sex. Taking low-dose aspirin every day. Taking vitamin and mineral supplements as recommended by your health care provider. What happens during an annual well check? The services and screenings done by your health care provider during your annual well check will depend on your age, overall health, lifestyle risk factors, and family history of disease. Counseling  Your health care provider may ask you questions about your: Alcohol use. Tobacco use. Drug use. Emotional well-being. Home and relationship well-being. Sexual activity. Eating habits. History of falls. Memory and ability to understand (cognition). Work and work Astronomer. Reproductive health. Screening  You may have the following tests or measurements: Height, weight, and BMI. Blood pressure. Lipid and cholesterol levels. These may be checked every 5 years, or more frequently if you are over 29 years old. Skin check. Lung cancer screening. You may have this screening every year starting at age 87 if you have a 30-pack-year history of smoking and currently smoke or have quit within the past 15 years. Fecal occult blood test (FOBT) of the stool. You may have this test every year starting at age 87. Flexible sigmoidoscopy or colonoscopy. You may have a sigmoidoscopy every 5 years or a colonoscopy every 10 years starting at age 87. Hepatitis C blood test. Hepatitis B blood test. Sexually transmitted disease (STD) testing. Diabetes screening. This is done by checking your blood sugar (glucose) after you have not  eaten for a while (fasting). You may have this done every 1-3 years. Bone density scan. This is done to screen for osteoporosis. You may have this done starting at age 87. Mammogram. This may be done every 87 years. Talk to your health care provider  about how often you should have regular mammograms. Talk with your health care provider about your test results, treatment options, and if necessary, the need for more tests. Vaccines  Your health care provider may recommend certain vaccines, such as: Influenza vaccine. This is recommended every year. Tetanus, diphtheria, and acellular pertussis (Tdap, Td) vaccine. You may need a Td booster every 10 years. Zoster vaccine. You may need this after age 87. Pneumococcal 13-valent conjugate (PCV13) vaccine. One dose is recommended after age 87. Pneumococcal polysaccharide (PPSV23) vaccine. One dose is recommended after age 87. Talk to your health care provider about which screenings and vaccines you need and how often you need them. This information is not intended to replace advice given to you by your health care provider. Make sure you discuss any questions you have with your health care provider. Document Released: 04/28/2015 Document Revised: 12/20/2015 Document Reviewed: 01/31/2015 Elsevier Interactive Patient Education  2017 [employer].  Fall Prevention in the Home Falls can cause injuries. They can happen to people of all ages. There are many things you can do to make your home safe and to help prevent falls. What can I do on the outside of my home? Regularly fix the edges of walkways and driveways and fix any cracks. Remove anything that might make you trip as you walk through a door, such as a raised step or threshold. Trim any bushes or trees on the path to your home. Use bright outdoor lighting. Clear any walking paths of anything that might make someone trip, such as rocks or tools. Regularly check to see if handrails are loose or broken. Make sure that both sides of any steps have handrails. Any raised decks and porches should have guardrails on the edges. Have any leaves, snow, or ice cleared regularly. Use sand or salt on walking paths during winter. Clean up any spills in  your garage right away. This includes oil or grease spills. What can I do in the bathroom? Use night lights. Install grab bars by the toilet and in the tub and shower. Do not use towel bars as grab bars. Use non-skid mats or decals in the tub or shower. If you need to sit down in the shower, use a plastic, non-slip stool. Keep the floor dry. Clean up any water that spills on the floor as soon as it happens. Remove soap buildup in the tub or shower regularly. Attach bath mats securely with double-sided non-slip rug tape. Do not have throw rugs and other things on the floor that can make you trip. What can I do in the bedroom? Use night lights. Make sure that you have a light by your bed that is easy to reach. Do not use any sheets or blankets that are too big for your bed. They should not hang down onto the floor. Have a firm chair that has side arms. You can use this for support while you get dressed. Do not have throw rugs and other things on the floor that can make you trip. What can I do in the kitchen? Clean up any spills right away. Avoid walking on wet floors. Keep items that you use a lot in easy-to-reach places. If you need to reach  something above you, use a strong step stool that has a grab bar. Keep electrical cords out of the way. Do not use floor polish or wax that makes floors slippery. If you must use wax, use non-skid floor wax. Do not have throw rugs and other things on the floor that can make you trip. What can I do with my stairs? Do not leave any items on the stairs. Make sure that there are handrails on both sides of the stairs and use them. Fix handrails that are broken or loose. Make sure that handrails are as long as the stairways. Check any carpeting to make sure that it is firmly attached to the stairs. Fix any carpet that is loose or worn. Avoid having throw rugs at the top or bottom of the stairs. If you do have throw rugs, attach them to the floor with carpet  tape. Make sure that you have a light switch at the top of the stairs and the bottom of the stairs. If you do not have them, ask someone to add them for you. What else can I do to help prevent falls? Wear shoes that: Do not have high heels. Have rubber bottoms. Are comfortable and fit you well. Are closed at the toe. Do not wear sandals. If you use a stepladder: Make sure that it is fully opened. Do not climb a closed stepladder. Make sure that both sides of the stepladder are locked into place. Ask someone to hold it for you, if possible. Clearly mark and make sure that you can see: Any grab bars or handrails. First and last steps. Where the edge of each step is. Use tools that help you move around (mobility aids) if they are needed. These include: Canes. Walkers. Scooters. Crutches. Turn on the lights when you go into a dark area. Replace any light bulbs as soon as they burn out. Set up your furniture so you have a clear path. Avoid moving your furniture around. If any of your floors are uneven, fix them. If there are any pets around you, be aware of where they are. Review your medicines with your doctor. Some medicines can make you feel dizzy. This can increase your chance of falling. Ask your doctor what other things that you can do to help prevent falls. This information is not intended to replace advice given to you by your health care provider. Make sure you discuss any questions you have with your health care provider. Document Released: 01/26/2009 Document Revised: 09/07/2015 Document Reviewed: 05/06/2014 Elsevier Interactive Patient Education  2017 [employer].

## 2022-10-11 ENCOUNTER — Other Ambulatory Visit: Payer: Self-pay

## 2022-10-11 ENCOUNTER — Telehealth: Payer: Self-pay | Admitting: Family Medicine

## 2022-10-11 LAB — CBC WITH DIFFERENTIAL/PLATELET
Basophils Absolute: 0.1 10*3/uL (ref 0.0–0.2)
Basos: 1 %
EOS (ABSOLUTE): 0.3 10*3/uL (ref 0.0–0.4)
Eos: 4 %
Hematocrit: 32.6 % — ABNORMAL LOW (ref 34.0–46.6)
Hemoglobin: 10.6 g/dL — ABNORMAL LOW (ref 11.1–15.9)
Immature Grans (Abs): 0 10*3/uL (ref 0.0–0.1)
Immature Granulocytes: 0 %
Lymphocytes Absolute: 2.1 10*3/uL (ref 0.7–3.1)
Lymphs: 29 %
MCH: 30.9 pg (ref 26.6–33.0)
MCHC: 32.5 g/dL (ref 31.5–35.7)
MCV: 95 fL (ref 79–97)
Monocytes Absolute: 0.5 10*3/uL (ref 0.1–0.9)
Monocytes: 7 %
Neutrophils Absolute: 4.2 10*3/uL (ref 1.4–7.0)
Neutrophils: 59 %
Platelets: 149 10*3/uL — ABNORMAL LOW (ref 150–450)
RBC: 3.43 x10E6/uL — ABNORMAL LOW (ref 3.77–5.28)
RDW: 13 % (ref 11.7–15.4)
WBC: 7.2 10*3/uL (ref 3.4–10.8)

## 2022-10-11 LAB — LIPID PANEL
Chol/HDL Ratio: 4.1 ratio (ref 0.0–4.4)
Cholesterol, Total: 319 mg/dL — ABNORMAL HIGH (ref 100–199)
HDL: 78 mg/dL (ref >39)
LDL Chol Calc (NIH): 212 mg/dL — ABNORMAL HIGH (ref 0–99)
Triglycerides: 158 mg/dL — ABNORMAL HIGH (ref 0–149)
VLDL Cholesterol Cal: 29 mg/dL (ref 5–40)

## 2022-10-11 LAB — COMPREHENSIVE METABOLIC PANEL
ALT: 10 IU/L (ref 0–32)
AST: 22 IU/L (ref 0–40)
Albumin: 4.5 g/dL (ref 3.6–4.6)
Alkaline Phosphatase: 148 IU/L — ABNORMAL HIGH (ref 44–121)
BUN/Creatinine Ratio: 23 (ref 12–28)
BUN: 35 mg/dL (ref 10–36)
Bilirubin Total: 0.2 mg/dL (ref 0.0–1.2)
CO2: 18 mmol/L — ABNORMAL LOW (ref 20–29)
Calcium: 10.7 mg/dL — ABNORMAL HIGH (ref 8.7–10.3)
Chloride: 109 mmol/L — ABNORMAL HIGH (ref 96–106)
Creatinine, Ser: 1.51 mg/dL — ABNORMAL HIGH (ref 0.57–1.00)
Globulin, Total: 3.1 g/dL (ref 1.5–4.5)
Glucose: 81 mg/dL (ref 70–99)
Potassium: 4.9 mmol/L (ref 3.5–5.2)
Sodium: 144 mmol/L (ref 134–144)
Total Protein: 7.6 g/dL (ref 6.0–8.5)
eGFR: 32 mL/min/{1.73_m2} — ABNORMAL LOW (ref >59)

## 2022-10-11 LAB — LITHOLINK CKD PROGRAM

## 2022-10-11 MED ORDER — TRIAMCINOLONE ACETONIDE 0.1 % EX CREA: 1.0000 | TOPICAL_CREAM | Freq: Two times a day (BID) | CUTANEOUS | 1 refills | Status: DC

## 2022-10-11 NOTE — Telephone Encounter (Signed)
  donepezil (ARICEPT) 10 MG tablet  -- PT DAUGHTER CALLED STATING THE PT DOESN'T TAKE THIS MED ANYMORE DUE TO IT MAKING THE PT SICK ON HER STOMACH. DR. [name] ASKED HER TO CALL BACK WITH THAT INFORMATION   PT DAUGHTER ALSO REQUESTED FOR THE CREAM THAT THE PT PUTS ON THE BACK OF HER HANDS FOR DRY SKIN TO BE CALLED INTO [facility] [place]

## 2022-10-11 NOTE — Telephone Encounter (Signed)
done

## 2022-10-23 ENCOUNTER — Other Ambulatory Visit: Payer: Self-pay

## 2022-10-23 ENCOUNTER — Telehealth: Payer: Self-pay

## 2022-10-23 DIAGNOSIS — I200 Unstable angina: Secondary | ICD-10-CM

## 2022-10-23 MED ORDER — NITROGLYCERIN 0.4 MG SL SUBL
0.4000 mg | SUBLINGUAL_TABLET | SUBLINGUAL | 2 refills | Status: AC | PRN
Start: 2022-10-23 — End: ?

## 2022-10-23 NOTE — Telephone Encounter (Signed)
done

## 2022-10-23 NOTE — Telephone Encounter (Signed)
Prescription Request  10/23/2022  LOV: Visit date not found  What is the name of the medication or equipment?  nitroGLYCERIN (NITROSTAT) 0.4 MG SL tablet   Have you contacted your pharmacy to request a refill? No   Which pharmacy would you like this sent to?  [id number] [place], [place] - [address] AT [facility] [address] [place] [address] Phone: [phone] Fax: [phone]    Patient notified that their request is being sent to the clinical staff for review and that they should receive a response within 2 business days.   Please advise at [facility] [phone]

## 2022-11-27 ENCOUNTER — Ambulatory Visit (INDEPENDENT_AMBULATORY_CARE_PROVIDER_SITE_OTHER): Payer: Medicare Other | Admitting: Podiatry

## 2022-11-27 ENCOUNTER — Encounter: Payer: Self-pay | Admitting: Podiatry

## 2022-11-27 DIAGNOSIS — I739 Peripheral vascular disease, unspecified: Secondary | ICD-10-CM | POA: Diagnosis not present

## 2022-11-27 DIAGNOSIS — M79675 Pain in left toe(s): Secondary | ICD-10-CM | POA: Diagnosis not present

## 2022-11-27 DIAGNOSIS — B351 Tinea unguium: Secondary | ICD-10-CM

## 2022-11-27 DIAGNOSIS — M79674 Pain in right toe(s): Secondary | ICD-10-CM | POA: Diagnosis not present

## 2022-11-27 NOTE — Progress Notes (Signed)
Subjective:  Patient ID: [name], female    DOB: 10/12/1925,   MRN: [id number]  No chief complaint on file.   89 y.o. female presents for concern of thickened elongated and painful nails that are difficult to trim. Requesting to have them trimmed today. She does have a history of PVD and at risk for foot care.   PCP:  Blane Ohara, MD    . Denies any other pedal complaints. Denies n/v/f/c.   Past Medical History:  Diagnosis Date   Abscess 2003   I&D SCM abscess   Acute ischemic colitis (HCC) 04/10/2011   acute episode of ischemic colitis, with abd pain, CT evidence of transverse colitis, and blood streaked BM.  Resolved with conservative treatment.   Arthritis    "hands, joints, knees" (07/19/2016)   Bacteremia 2003   hx of staph bacteremia   CAD (coronary artery disease) 2003, 2010   s/p stenting of RCA x2, s/p stent to OM;  LHC 04/2009 in the setting of a NSTEMI: EF 65%, RCA stent patent with 60% ISR proximal and 40% ISR mid, PLV proximal 50-60% and distal 50%, ostial circumflex 30-40%, proximal OM mild in-stent restenosis with a branch with 90+ percent ostial stenosis, proximal LAD 30-40%.  FFR of ostial circumflex:  0.96 - not flow limiting.  Medical therapy was continued.   Chronic lower back pain    "not a big problem" (07/19/2016)   Closed displaced fracture of distal phalanx of left middle finger 05/23/2017   Degenerative disc disease    Finger infection    GERD (gastroesophageal reflux disease)    History of medication noncompliance    concerning Plavix   HTN (hypertension)    Echocardiogram 08/26/11: Mild focal basal septal hypertrophy, EF 55-60%, grade 2 diastolic dysfunction, mild MR.  There was a fluid-filled area likely in the location of the liver noted   Hyperlipidemia    Hyperparathyroidism 2003   parathyroid adenoma localized to the right inferior thyroid lobe region   Hypothyroidism    Left renal artery stenosis (HCC)    40%   Lumbar stenosis with  neurogenic claudication 2006   s/p decompression L2-L5   Myocardial infarction [facility]) ~ 2013; 05/06/2016   Osteoporosis 2009   diagnosed by bone density scan in 2009, on Vit D only, no bisphosphonate therapy   Phlebitis    LLE   PVD (peripheral vascular disease) (HCC)    s/p left fem-pop bypass graft   TIA (transient ischemic attack)    carotid dopplers 5/13: no ICA stenosis    Objective:  Physical Exam: Vascular: DP/PT pulses 2/4 bilateral. CFT <3 seconds. Absent hair growth on digits. Edema noted to bilateral lower extremities. Xerosis noted bilaterally.  Skin. No lacerations or abrasions bilateral feet. Nails 1-5 bilateral  are thickened discolored and elongated with subungual debris.  Musculoskeletal: MMT 5/5 bilateral lower extremities in DF, PF, Inversion and Eversion. Deceased ROM in DF of ankle joint.  Neurological: Sensation intact to light touch. Protective sensation intact bilateral.    Assessment:   1. Pain due to onychomycosis of toenails of both feet   2. Peripheral vascular disease (HCC)      Plan:  Patient was evaluated and treated and all questions answered. -Discussed supportive shoes at all times and checking feet regularly. -Mechanically debrided all nails 1-5 bilateral using sterile nail nipper and filed with dremel without incident  -Answered all patient questions -Patient to return  in 3 months for at risk foot care -Patient advised to  call the office if any problems or questions arise in the meantime.   Louann Sjogren, DPM

## 2022-11-28 DIAGNOSIS — E210 Primary hyperparathyroidism: Secondary | ICD-10-CM | POA: Diagnosis not present

## 2022-11-28 DIAGNOSIS — N289 Disorder of kidney and ureter, unspecified: Secondary | ICD-10-CM | POA: Diagnosis not present

## 2022-11-28 DIAGNOSIS — M810 Age-related osteoporosis without current pathological fracture: Secondary | ICD-10-CM | POA: Diagnosis not present

## 2022-12-11 ENCOUNTER — Other Ambulatory Visit: Payer: Self-pay

## 2022-12-11 MED ORDER — TRIAMCINOLONE ACETONIDE 0.1 % EX CREA: 1.0000 | TOPICAL_CREAM | Freq: Two times a day (BID) | CUTANEOUS | 1 refills | Status: DC

## 2022-12-12 ENCOUNTER — Telehealth: Payer: Self-pay

## 2022-12-12 NOTE — Telephone Encounter (Signed)
Agree. Dr. [name]  

## 2022-12-12 NOTE — Telephone Encounter (Signed)
Family member called reporting sudden onset of numbness in lower extremity.  States that patient was putting away her dishes from lunch and she called out for assistance after she could not move her leg to walk.  Patient was helped to a chair without falling.  Patient denies numbness/tingling in other parts of her body.  Patient is able to push against family member hand with her leg however it is "numb" feeling.  Advised to call EMS to have this evaluated in the ED as patient has history of TIA.

## 2023-01-12 ENCOUNTER — Other Ambulatory Visit: Payer: Self-pay | Admitting: Family Medicine

## 2023-01-12 DIAGNOSIS — K219 Gastro-esophageal reflux disease without esophagitis: Secondary | ICD-10-CM

## 2023-01-28 ENCOUNTER — Other Ambulatory Visit: Payer: Self-pay | Admitting: Cardiology

## 2023-01-28 ENCOUNTER — Other Ambulatory Visit: Payer: Self-pay | Admitting: Family Medicine

## 2023-01-29 DIAGNOSIS — I129 Hypertensive chronic kidney disease with stage 1 through stage 4 chronic kidney disease, or unspecified chronic kidney disease: Secondary | ICD-10-CM | POA: Diagnosis not present

## 2023-01-29 DIAGNOSIS — E213 Hyperparathyroidism, unspecified: Secondary | ICD-10-CM | POA: Diagnosis not present

## 2023-01-29 DIAGNOSIS — D631 Anemia in chronic kidney disease: Secondary | ICD-10-CM | POA: Diagnosis not present

## 2023-01-29 DIAGNOSIS — N189 Chronic kidney disease, unspecified: Secondary | ICD-10-CM | POA: Diagnosis not present

## 2023-01-29 DIAGNOSIS — N184 Chronic kidney disease, stage 4 (severe): Secondary | ICD-10-CM | POA: Diagnosis not present

## 2023-02-10 ENCOUNTER — Encounter: Payer: Self-pay | Admitting: Family Medicine

## 2023-02-10 ENCOUNTER — Ambulatory Visit: Payer: Medicare Other | Admitting: Family Medicine

## 2023-02-10 VITALS — BP 122/72 | HR 70 | Temp 96.7°F | Resp 16 | Ht 62.0 in | Wt 90.6 lb

## 2023-02-10 DIAGNOSIS — K219 Gastro-esophageal reflux disease without esophagitis: Secondary | ICD-10-CM | POA: Diagnosis not present

## 2023-02-10 DIAGNOSIS — I110 Hypertensive heart disease with heart failure: Secondary | ICD-10-CM

## 2023-02-10 DIAGNOSIS — F0280 Dementia in other diseases classified elsewhere without behavioral disturbance: Secondary | ICD-10-CM

## 2023-02-10 DIAGNOSIS — D638 Anemia in other chronic diseases classified elsewhere: Secondary | ICD-10-CM | POA: Diagnosis not present

## 2023-02-10 DIAGNOSIS — G309 Alzheimer's disease, unspecified: Secondary | ICD-10-CM | POA: Diagnosis not present

## 2023-02-10 DIAGNOSIS — I2510 Atherosclerotic heart disease of native coronary artery without angina pectoris: Secondary | ICD-10-CM | POA: Diagnosis not present

## 2023-02-10 DIAGNOSIS — E782 Mixed hyperlipidemia: Secondary | ICD-10-CM | POA: Diagnosis not present

## 2023-02-10 NOTE — Progress Notes (Unsigned)
Subjective:  Patient ID: [name], female    DOB: 11/20/1925  Age: 89 y.o. MRN: [id number]  Chief Complaint  Patient presents with   Medical Management of Chronic Issues    HPI Hypertensive heart disease: Norvasc 5 mg daily, Metoprolol 25 mg (75 mg total-take 2 tablets in the a.m. and 1 qhs). HX of MI, CAD, PVD. Has some swelling in his feet (Left > right.) Brilinta 90 mg twice daily, and baby aspirin. ``      Hyperlipidemia: patient is not taking zocor 40 mg daily, but developed muscle pain. Patient has dementia.   GERD: Pepcid 40 mg daily. Occasional heartburn. Sometimes has to take a tums.    Dementia/Depression: Remeron 7.5 mg qhs   Alzheimer Disease: very poor memory.    Back pain: Takes tramadol about once a week.      02/10/2023    1:59 PM 10/10/2022    2:15 PM 07/04/2022    2:40 PM 08/22/2021    3:07 PM 12/05/2020    3:02 PM  Depression screen PHQ 2/9  Decreased Interest 0 1 0 0 0  Down, Depressed, Hopeless 0 1 0 0 0  PHQ - 2 Score 0 2 0 0 0  Altered sleeping  0     Tired, decreased energy  0     Change in appetite  0     Feeling bad or failure about yourself   0     Trouble concentrating  0     Moving slowly or fidgety/restless  0     Suicidal thoughts  0     PHQ-9 Score  2     Difficult doing work/chores  Somewhat difficult           02/10/2023    1:59 PM  Fall Risk   Falls in the past year? 0  Number falls in past yr: 0  Injury with Fall? 0  Risk for fall due to : Impaired balance/gait;Impaired mobility  Follow up Falls evaluation completed;Falls prevention discussed    Patient Care Team: Blane Ohara, MD as PCP - General (Family Medicine) Rollene Rotunda, MD as PCP - Cardiology (Cardiology) Zettie Pho, [facility] (Inactive) (Pharmacist) Anthony Sar, MD as Consulting Physician (Nephrology) Talmage Coin, MD as Consulting Physician (Endocrinology)   Review of Systems  Constitutional:  Positive for fatigue. Negative for fever.  HENT:   Negative for congestion, ear pain and sore throat.   Respiratory:  Negative for cough and shortness of breath.   Cardiovascular:  Negative for chest pain.  Gastrointestinal:  Negative for abdominal pain, constipation, diarrhea, nausea and vomiting.  Endocrine: Negative for polydipsia, polyphagia and polyuria.  Genitourinary:  Negative for dysuria.  Musculoskeletal:  Positive for arthralgias (stiffness of knees and hips) and back pain. Negative for myalgias.  Neurological:  Negative for headaches.  Psychiatric/Behavioral:  Negative for dysphoric mood. The patient is not nervous/anxious.     Current Outpatient Medications on File Prior to Visit  Medication Sig Dispense Refill   amLODipine (NORVASC) 5 MG tablet TAKE 1 TABLET (5 MG TOTAL) BY MOUTH DAILY. 90 tablet 1   Ascorbic Acid (VITAMIN C) 100 MG tablet Take by mouth daily.     aspirin EC 81 MG tablet Take 1 tablet (81 mg total) by mouth daily. Swallow whole. 90 tablet 3   b complex vitamins tablet Take 1 tablet by mouth daily.     cinacalcet (SENSIPAR) 30 MG tablet Take 30 mg by mouth 2 (two) times daily.  Coenzyme Q10 (COQ10) 50 MG CAPS Take 50 mg by mouth daily.     dorzolamide-timolol (COSOPT) 2-0.5 % ophthalmic solution Place 2 drops into the left eye at bedtime. 10 mL 0   ELDERBERRY PO Take by mouth daily.     famotidine (PEPCID) 40 MG tablet TAKE 1 TABLET BY MOUTH EVERY DAY 90 tablet 2   MAGNESIUM GLUCONATE PO Take by mouth daily.     metoprolol succinate (TOPROL-XL) 25 MG 24 hr tablet TAKE 3 TABLETS BY MOUTH DAILY 270 tablet 0   mirtazapine (REMERON) 7.5 MG tablet Take 1 tablet (7.5 mg total) by mouth at bedtime. 90 tablet 2   nitroGLYCERIN (NITROSTAT) 0.4 MG SL tablet Place 1 tablet (0.4 mg total) under the tongue every 5 (five) minutes as needed. For chest pain 25 tablet 2   ticagrelor (BRILINTA) 90 MG TABS tablet TAKE 1 TABLET BY MOUTH TWICE A DAY 180 tablet 2   traMADol (ULTRAM) 50 MG tablet Take 2 tablets (100 mg total) by  mouth daily. 60 tablet 3   triamcinolone cream (KENALOG) 0.1 % APPLY 1 APPLICATION TOPICALLY 2 (TWO) TIMES DAILY. BOTH HANDS 30 g 1   [DISCONTINUED] simvastatin (ZOCOR) 40 MG tablet Take 1 tablet (40 mg total) by mouth at bedtime. 30 tablet 0   No current facility-administered medications on file prior to visit.   Past Medical History:  Diagnosis Date   Abscess 2003   I&D SCM abscess   Acute ischemic colitis (HCC) 04/10/2011   acute episode of ischemic colitis, with abd pain, CT evidence of transverse colitis, and blood streaked BM.  Resolved with conservative treatment.   Arthritis    "hands, joints, knees" (07/19/2016)   Bacteremia 2003   hx of staph bacteremia   CAD (coronary artery disease) 2003, 2010   s/p stenting of RCA x2, s/p stent to OM;  LHC 04/2009 in the setting of a NSTEMI: EF 65%, RCA stent patent with 60% ISR proximal and 40% ISR mid, PLV proximal 50-60% and distal 50%, ostial circumflex 30-40%, proximal OM mild in-stent restenosis with a branch with 90+ percent ostial stenosis, proximal LAD 30-40%.  FFR of ostial circumflex:  0.96 - not flow limiting.  Medical therapy was continued.   Chronic lower back pain    "not a big problem" (07/19/2016)   Closed displaced fracture of distal phalanx of left middle finger 05/23/2017   Degenerative disc disease    Finger infection    GERD (gastroesophageal reflux disease)    History of medication noncompliance    concerning Plavix   HTN (hypertension)    Echocardiogram 08/26/11: Mild focal basal septal hypertrophy, EF 55-60%, grade 2 diastolic dysfunction, mild MR.  There was a fluid-filled area likely in the location of the liver noted   Hyperlipidemia    Hyperparathyroidism 2003   parathyroid adenoma localized to the right inferior thyroid lobe region   Hypothyroidism    Left renal artery stenosis (HCC)    40%   Lumbar stenosis with neurogenic claudication 2006   s/p decompression L2-L5   Myocardial infarction (HCC) ~ 2013;  05/06/2016   Osteoporosis 2009   diagnosed by bone density scan in 2009, on Vit D only, no bisphosphonate therapy   Phlebitis    LLE   PVD (peripheral vascular disease) (HCC)    s/p left fem-pop bypass graft   TIA (transient ischemic attack)    carotid dopplers 5/13: no ICA stenosis   Past Surgical History:  Procedure Laterality Date   AMPUTATION FINGER  Left 05/24/2017   Procedure: AMPUTATION tip of long FINGER;  Surgeon: Knute Neu, MD;  Location: [facility] OR;  Service: Plastics;  Laterality: Left;   CARDIAC CATHETERIZATION N/A 05/06/2016   Procedure: Left Heart Cath and Coronary Angiography;  Surgeon: Runell Gess, MD;  Location: [facility] INVASIVE CV LAB;  Service: Cardiovascular;  Laterality: N/A;   CARDIAC CATHETERIZATION N/A 05/06/2016   Procedure: Coronary Stent Intervention;  Surgeon: Runell Gess, MD;  Location: [facility] INVASIVE CV LAB;  Service: Cardiovascular;  Laterality: N/A;  RCA   CATARACT EXTRACTION, BILATERAL Bilateral    CORONARY ANGIOPLASTY WITH STENT PLACEMENT  09/2008   CORONARY ANGIOPLASTY WITH STENT PLACEMENT  04/2001   ENDOSCOPIC HEMILAMINOTOMY W/ DISCECTOMY LUMBAR  06/2004   L3, L4 lami; L2, L5 hemilami; decompresion L2-3, 3-4, 4-   FEMORAL BYPASS  before 2003   LLE   I & D EXTREMITY Left 05/24/2017   Procedure: IRRIGATION AND DEBRIDEMENT LEFT LONG FINGER;  Surgeon: Knute Neu, MD;  Location: [facility] OR;  Service: Plastics;  Laterality: Left;   INCISION AND DRAINAGE ANTERIOR NECK  10/2001   absces sternoclavicular joint    Family History  Problem Relation Age of Onset   Stroke Mother    Stroke Father    Breast cancer Daughter    Social History   Socioeconomic History   Marital status: Widowed    Spouse name: Not on file   Number of children: Not on file   Years of education: Not on file   Highest education level: Not on file  Occupational History   Not on file  Tobacco Use   Smoking status: Never   Smokeless tobacco: Never  Vaping Use   Vaping status: Never  Used  Substance and Sexual Activity   Alcohol use: No   Drug use: No   Sexual activity: Never  Other Topics Concern   Not on file  Social History Narrative   Lives with daughter and granddaughter   Social Determinants of Health   Financial Resource Strain: Low Risk  (10/10/2022)   Overall Financial Resource Strain (CARDIA)    Difficulty of Paying Living Expenses: Not hard at all  Food Insecurity: No Food Insecurity (10/10/2022)   Hunger Vital Sign    Worried About Running Out of Food in the Last Year: Never true    Ran Out of Food in the Last Year: Never true  Transportation Needs: No Transportation Needs (10/10/2022)   PRAPARE - Administrator, Civil Service (Medical): No    Lack of Transportation (Non-Medical): No  Physical Activity: Inactive (10/10/2022)   Exercise Vital Sign    Days of Exercise per Week: 0 days    Minutes of Exercise per Session: 0 min  Stress: No Stress Concern Present (10/10/2022)   [employer] of Occupational Health - Occupational Stress Questionnaire    Feeling of Stress : Not at all  Social Connections: Moderately Isolated (08/22/2021)   Social Connection and Isolation Panel [NHANES]    Frequency of Communication with Friends and Family: More than three times a week    Frequency of Social Gatherings with Friends and Family: More than three times a week    Attends Religious Services: More than 4 times per year    Active Member of [employer] or Organizations: No    Attends Banker Meetings: Never    Marital Status: Widowed    Objective:  BP 122/72   Pulse 70   Temp (!) 96.7 F (35.9 C)  Resp 16   Ht 5\' 2"  (1.575 m)   Wt 90 lb 9.6 oz (41.1 kg)   BMI 16.57 kg/m      02/10/2023    2:00 PM 02/10/2023    1:55 PM 10/10/2022    2:30 PM  BP/Weight  Systolic BP 122 150 112  Diastolic BP 72 68 62  Wt. (Lbs)  90.6 89.8  BMI  16.57 kg/m2 16.42 kg/m2    Physical Exam Vitals reviewed.  Constitutional:      Comments: Thin.   Neck:     Vascular: No carotid bruit.  Cardiovascular:     Rate and Rhythm: Normal rate and regular rhythm.     Heart sounds: Normal heart sounds.  Pulmonary:     Effort: Pulmonary effort is normal. No respiratory distress.     Breath sounds: Normal breath sounds.  Abdominal:     General: Abdomen is flat. Bowel sounds are normal.     Palpations: Abdomen is soft.     Tenderness: There is no abdominal tenderness.  Musculoskeletal:     Comments: Thoracic kyphosis.  Neurological:     Mental Status: She is alert and oriented to person, place, and time.  Psychiatric:        Mood and Affect: Mood normal.        Behavior: Behavior normal.     Diabetic Foot Exam - Simple   No data filed      Lab Results  Component Value Date   WBC 7.1 02/10/2023   HGB 10.9 (L) 02/10/2023   HCT 34.5 02/10/2023   PLT 194 02/10/2023   GLUCOSE 82 02/10/2023   CHOL 313 (H) 02/10/2023   TRIG 156 (H) 02/10/2023   HDL 71 02/10/2023   LDLCALC 213 (H) 02/10/2023   ALT 11 02/10/2023   AST 17 02/10/2023   NA 142 02/10/2023   K 5.1 02/10/2023   CL 105 02/10/2023   CREATININE 1.96 (H) 02/10/2023   BUN 35 02/10/2023   CO2 19 (L) 02/10/2023   TSH 0.653 07/04/2022   INR 1.09 06/25/2016   HGBA1C 5.9 (H) 08/26/2011      Assessment & Plan:    Alzheimer disease (HCC) Assessment & Plan: Prefers no medicines.    Mixed hyperlipidemia Assessment & Plan: Statins are not indicated due to progressed dementia. Also patient does not tolerate them. She and her daughter do not wish to pursue treatment.   Orders: -     Comprehensive metabolic panel -     Lipid panel  Gastroesophageal reflux disease without esophagitis Assessment & Plan: The current medical regimen is effective;  continue present plan and medications. Continue pepcid 40 mg daily.    Coronary artery disease involving native coronary artery of native heart without angina pectoris Assessment & Plan: Continue aspirin 81 mg daily,  brilinta 90 mg twice daily, metoprolol xl 25 mg 3 tablets daily. Has ntg.   Hypertensive heart disease with heart failure [facility]) Assessment & Plan: The current medical regimen is effective;  continue present plan and medications. Continue Norvasc 5 mg daily, Metoprolol 25 mg (75 mg total-take 2 tablets in the a.m. and 1 qhs)   Anemia of chronic disease -     CBC with Differential/Platelet     No orders of the defined types were placed in this encounter.   Orders Placed This Encounter  Procedures   CBC with Differential/Platelet   Comprehensive metabolic panel   Lipid panel     Follow-up: Return in about 4 months (  around 06/13/2023) for chronic follow up.   Madelynn Done Smith,acting as a Neurosurgeon for Blane Ohara, MD.,have documented all relevant documentation on the behalf of Blane Ohara, MD,as directed by  Blane Ohara, MD while in the presence of Blane Ohara, MD.   An After Visit Summary was printed and given to the patient.  I attest that I have reviewed this visit and agree with the plan scribed by my staff.   Blane Ohara, MD [facility] [phone]

## 2023-02-11 LAB — CBC WITH DIFFERENTIAL/PLATELET
Basophils Absolute: 0.1 10*3/uL (ref 0.0–0.2)
Basos: 1 %
EOS (ABSOLUTE): 0.1 10*3/uL (ref 0.0–0.4)
Eos: 2 %
Hematocrit: 34.5 % (ref 34.0–46.6)
Hemoglobin: 10.9 g/dL — ABNORMAL LOW (ref 11.1–15.9)
Immature Grans (Abs): 0 10*3/uL (ref 0.0–0.1)
Immature Granulocytes: 0 %
Lymphocytes Absolute: 2.1 10*3/uL (ref 0.7–3.1)
Lymphs: 29 %
MCH: 31.1 pg (ref 26.6–33.0)
MCHC: 31.6 g/dL (ref 31.5–35.7)
MCV: 99 fL — ABNORMAL HIGH (ref 79–97)
Monocytes Absolute: 0.6 10*3/uL (ref 0.1–0.9)
Monocytes: 9 %
Neutrophils Absolute: 4.2 10*3/uL (ref 1.4–7.0)
Neutrophils: 59 %
Platelets: 194 10*3/uL (ref 150–450)
RBC: 3.5 x10E6/uL — ABNORMAL LOW (ref 3.77–5.28)
RDW: 12.8 % (ref 11.7–15.4)
WBC: 7.1 10*3/uL (ref 3.4–10.8)

## 2023-02-11 LAB — COMPREHENSIVE METABOLIC PANEL
ALT: 11 [IU]/L (ref 0–32)
AST: 17 [IU]/L (ref 0–40)
Albumin: 4.6 g/dL (ref 3.6–4.6)
Alkaline Phosphatase: 176 [IU]/L — ABNORMAL HIGH (ref 44–121)
BUN/Creatinine Ratio: 18 (ref 12–28)
BUN: 35 mg/dL (ref 10–36)
Bilirubin Total: 0.3 mg/dL (ref 0.0–1.2)
CO2: 19 mmol/L — ABNORMAL LOW (ref 20–29)
Calcium: 10.6 mg/dL — ABNORMAL HIGH (ref 8.7–10.3)
Chloride: 105 mmol/L (ref 96–106)
Creatinine, Ser: 1.96 mg/dL — ABNORMAL HIGH (ref 0.57–1.00)
Globulin, Total: 3 g/dL (ref 1.5–4.5)
Glucose: 82 mg/dL (ref 70–99)
Potassium: 5.1 mmol/L (ref 3.5–5.2)
Sodium: 142 mmol/L (ref 134–144)
Total Protein: 7.6 g/dL (ref 6.0–8.5)
eGFR: 23 mL/min/{1.73_m2} — ABNORMAL LOW (ref >59)

## 2023-02-11 LAB — LIPID PANEL
Chol/HDL Ratio: 4.4 ratio (ref 0.0–4.4)
Cholesterol, Total: 313 mg/dL — ABNORMAL HIGH (ref 100–199)
HDL: 71 mg/dL (ref >39)
LDL Chol Calc (NIH): 213 mg/dL — ABNORMAL HIGH (ref 0–99)
Triglycerides: 156 mg/dL — ABNORMAL HIGH (ref 0–149)
VLDL Cholesterol Cal: 29 mg/dL (ref 5–40)

## 2023-02-12 NOTE — Assessment & Plan Note (Signed)
Statins are not indicated due to progressed dementia. Also patient does not tolerate them. She and her daughter do not wish to pursue treatment.

## 2023-02-12 NOTE — Assessment & Plan Note (Signed)
The current medical regimen is effective;  continue present plan and medications. Continue Norvasc 5 mg daily, Metoprolol 25 mg (75 mg total-take 2 tablets in the a.m. and 1 qhs)

## 2023-02-12 NOTE — Assessment & Plan Note (Signed)
Prefers no medicines.

## 2023-02-12 NOTE — Assessment & Plan Note (Addendum)
Continue aspirin 81 mg daily, brilinta 90 mg twice daily, metoprolol xl 25 mg 3 tablets daily. Has ntg.

## 2023-02-12 NOTE — Assessment & Plan Note (Signed)
The current medical regimen is effective;  continue present plan and medications. 

## 2023-02-15 ENCOUNTER — Other Ambulatory Visit: Payer: Self-pay

## 2023-02-15 ENCOUNTER — Encounter (HOSPITAL_COMMUNITY): Payer: Self-pay

## 2023-02-15 ENCOUNTER — Emergency Department (HOSPITAL_COMMUNITY)
Admission: EM | Admit: 2023-02-15 | Discharge: 2023-02-15 | Disposition: A | Discharge status: Home / Self Care | Payer: Medicare Other | Attending: Emergency Medicine | Admitting: Emergency Medicine

## 2023-02-15 DIAGNOSIS — W44F3XA Food entering into or through a natural orifice, initial encounter: Secondary | ICD-10-CM | POA: Insufficient documentation

## 2023-02-15 DIAGNOSIS — I10 Essential (primary) hypertension: Secondary | ICD-10-CM | POA: Insufficient documentation

## 2023-02-15 DIAGNOSIS — T18128A Food in esophagus causing other injury, initial encounter: Secondary | ICD-10-CM | POA: Insufficient documentation

## 2023-02-15 DIAGNOSIS — Z79899 Other long term (current) drug therapy: Secondary | ICD-10-CM | POA: Insufficient documentation

## 2023-02-15 DIAGNOSIS — Z7982 Long term (current) use of aspirin: Secondary | ICD-10-CM | POA: Diagnosis not present

## 2023-02-15 LAB — COMPREHENSIVE METABOLIC PANEL
ALT: 14 U/L (ref 0–44)
AST: 21 U/L (ref 15–41)
Albumin: 4.3 g/dL (ref 3.5–5.0)
Alkaline Phosphatase: 146 U/L — ABNORMAL HIGH (ref 38–126)
Anion gap: 10 (ref 5–15)
BUN: 36 mg/dL — ABNORMAL HIGH (ref 8–23)
CO2: 21 mmol/L — ABNORMAL LOW (ref 22–32)
Calcium: 9.4 mg/dL (ref 8.9–10.3)
Chloride: 107 mmol/L (ref 98–111)
Creatinine, Ser: 2.14 mg/dL — ABNORMAL HIGH (ref 0.44–1.00)
GFR, Estimated: 21 mL/min — ABNORMAL LOW (ref >60)
Glucose, Bld: 98 mg/dL (ref 70–99)
Potassium: 4.3 mmol/L (ref 3.5–5.1)
Sodium: 138 mmol/L (ref 135–145)
Total Bilirubin: 0.6 mg/dL (ref 0.3–1.2)
Total Protein: 8 g/dL (ref 6.5–8.1)

## 2023-02-15 LAB — CBC WITH DIFFERENTIAL/PLATELET
Abs Immature Granulocytes: 0.01 10*3/uL (ref 0.00–0.07)
Basophils Absolute: 0.1 10*3/uL (ref 0.0–0.1)
Basophils Relative: 1 %
Eosinophils Absolute: 0.2 10*3/uL (ref 0.0–0.5)
Eosinophils Relative: 4 %
HCT: 34.1 % — ABNORMAL LOW (ref 36.0–46.0)
Hemoglobin: 10.7 g/dL — ABNORMAL LOW (ref 12.0–15.0)
Immature Granulocytes: 0 %
Lymphocytes Relative: 24 %
Lymphs Abs: 1.6 10*3/uL (ref 0.7–4.0)
MCH: 31.9 pg (ref 26.0–34.0)
MCHC: 31.4 g/dL (ref 30.0–36.0)
MCV: 101.8 fL — ABNORMAL HIGH (ref 80.0–100.0)
Monocytes Absolute: 0.5 10*3/uL (ref 0.1–1.0)
Monocytes Relative: 7 %
Neutro Abs: 4.3 10*3/uL (ref 1.7–7.7)
Neutrophils Relative %: 64 %
Platelets: 162 10*3/uL (ref 150–400)
RBC: 3.35 MIL/uL — ABNORMAL LOW (ref 3.87–5.11)
RDW: 13.2 % (ref 11.5–15.5)
WBC: 6.7 10*3/uL (ref 4.0–10.5)
nRBC: 0 % (ref 0.0–0.2)

## 2023-02-15 MED ORDER — GLUCAGON HCL RDNA (DIAGNOSTIC) 1 MG IJ SOLR
1.0000 mg | Freq: Once | INTRAMUSCULAR | Status: AC
  Administered 2023-02-15: 1 mg via INTRAVENOUS
  Filled 2023-02-15: qty 1

## 2023-02-15 NOTE — ED Triage Notes (Signed)
Patient ate 2 gummy vitamins at the same time at 4:30pm but she feels like they are still caught in her throat. Tried swallowing coke, water, apple sauce but still feels pressure in throat.

## 2023-02-15 NOTE — Discharge Instructions (Signed)
Drink plenty of fluids and follow-up with your doctor for recheck next week.  If you have any more problems prior to then you can come back to the emergency department

## 2023-02-15 NOTE — ED Provider Notes (Signed)
[place] EMERGENCY DEPARTMENT AT [facility] Provider Note   CSN: [id number] Arrival date & time: 02/15/23  1847     History {Add pertinent medical, surgical, social history, OB history to HPI:1} Chief Complaint  Patient presents with   Food Bolus    [name] is a 89 y.o. female.  Patient has a history of hypertension.  She was eating 2 gummy vitamins at the same time and they got stuck in her throat causing her to have some pain in her upper chest   Chest Pain      Home Medications Prior to Admission medications   Medication Sig Start Date End Date Taking? Authorizing Provider  amLODipine (NORVASC) 5 MG tablet TAKE 1 TABLET (5 MG TOTAL) BY MOUTH DAILY. 01/28/23   CoxFritzi Mandes, MD  Ascorbic Acid (VITAMIN C) 100 MG tablet Take by mouth daily.    [provider]  aspirin EC 81 MG tablet Take 1 tablet (81 mg total) by mouth daily. Swallow whole. 03/16/21   Rollene Rotunda, MD  b complex vitamins tablet Take 1 tablet by mouth daily.    [provider]  cinacalcet (SENSIPAR) 30 MG tablet Take 30 mg by mouth 2 (two) times daily. 12/18/21   [provider]  Coenzyme Q10 (COQ10) 50 MG CAPS Take 50 mg by mouth daily.    [provider]  dorzolamide-timolol (COSOPT) 2-0.5 % ophthalmic solution Place 2 drops into the left eye at bedtime. 10/10/22   Blane Ohara, MD  ELDERBERRY PO Take by mouth daily.    [provider]  famotidine (PEPCID) 40 MG tablet TAKE 1 TABLET BY MOUTH EVERY DAY 01/12/23   [name], Kirsten, MD  MAGNESIUM GLUCONATE PO Take by mouth daily.    [provider]  metoprolol succinate (TOPROL-XL) 25 MG 24 hr tablet TAKE 3 TABLETS BY MOUTH DAILY 01/29/23   Rollene Rotunda, MD  mirtazapine (REMERON) 7.5 MG tablet Take 1 tablet (7.5 mg total) by mouth at bedtime. 09/10/22   Marianne Sofia, PA-C  nitroGLYCERIN (NITROSTAT) 0.4 MG SL tablet Place 1 tablet (0.4 mg total) under the tongue every 5 (five) minutes as  needed. For chest pain 10/23/22   [name], Fritzi Mandes, MD  ticagrelor (BRILINTA) 90 MG TABS tablet TAKE 1 TABLET BY MOUTH TWICE A DAY 07/25/22   Rollene Rotunda, MD  traMADol (ULTRAM) 50 MG tablet Take 2 tablets (100 mg total) by mouth daily. 03/27/22   Abigail Miyamoto, MD  triamcinolone cream (KENALOG) 0.1 % APPLY 1 APPLICATION TOPICALLY 2 (TWO) TIMES DAILY. BOTH HANDS 01/28/23   [name], Fritzi Mandes, MD  simvastatin (ZOCOR) 40 MG tablet Take 1 tablet (40 mg total) by mouth at bedtime. 04/16/11 06/20/11  Linward Headland, MD      Allergies    Aricept [donepezil], Diphenhydramine hcl, Penicillins, Shellfish allergy, and Statins    Review of Systems   Review of Systems  Cardiovascular:  Positive for chest pain.    Physical Exam Updated Vital Signs BP (!) 164/77   Pulse (!) 56   Temp 98.2 F (36.8 C) (Oral)   Resp 18   Ht 5\' 2"  (1.575 m)   Wt 41 kg   SpO2 100%   BMI 16.53 kg/m  Physical Exam  ED Results / Procedures / Treatments   Labs (all labs ordered are listed, but only abnormal results are displayed) Labs Reviewed  CBC WITH DIFFERENTIAL/PLATELET - Abnormal; Notable for the following components:      Result Value   RBC  3.35 (*)    Hemoglobin 10.7 (*)    HCT 34.1 (*)    MCV 101.8 (*)    All other components within normal limits  COMPREHENSIVE METABOLIC PANEL - Abnormal; Notable for the following components:   CO2 21 (*)    BUN 36 (*)    Creatinine, Ser 2.14 (*)    Alkaline Phosphatase 146 (*)    GFR, Estimated 21 (*)    All other components within normal limits    EKG None  Radiology No results found.  Procedures Procedures  {Document cardiac monitor, telemetry assessment procedure when appropriate:1}  Medications Ordered in ED Medications  glucagon (human recombinant) (GLUCAGEN) injection 1 mg (1 mg Intravenous Given 02/15/23 1933)    ED Course/ Medical Decision Making/ A&P   {   Click here for ABCD2, HEART and other calculatorsREFRESH Note before signing :1}                               Medical Decision Making Amount and/or Complexity of Data Reviewed Labs: ordered.  Risk Prescription drug management.   Patient with a food impaction of esophagus that was resolved by [facility].  She will follow-up with her PCP  {Document critical care time when appropriate:1} {Document review of labs and clinical decision tools ie heart score, Chads2Vasc2 etc:1}  {Document your independent review of radiology images, and any outside records:1} {Document your discussion with family members, caretakers, and with consultants:1} {Document social determinants of health affecting pt's care:1} {Document your decision making why or why not admission, treatments were needed:1} Final Clinical Impression(s) / ED Diagnoses Final diagnoses:  Food impaction of esophagus, initial encounter    Rx / DC Orders ED Discharge Orders     None

## 2023-02-28 ENCOUNTER — Telehealth: Payer: Self-pay | Admitting: *Deleted

## 2023-02-28 NOTE — Telephone Encounter (Signed)
Transition Care Management Follow-up Telephone Call Date of discharge and from where: [facility]  02/15/2023 How have you been since you were released from the hospital? Doing well the ed fixed what was bothering her mom  Any questions or concerns? No  Items Reviewed: Did the pt receive and understand the discharge instructions provided? Yes  Medications obtained and verified? Yes  Other? No  Any new allergies since your discharge? No  Dietary orders reviewed? Yes Do you have support at home? Yes  Follow up appointments reviewed:  PCP Hospital f/u appt confirmed? Yes   Specialist Hospital f/u appt confirmed? No   Are transportation arrangements needed? No  If their condition worsens, is the pt aware to call PCP or go to the Emergency Dept.? Yes Was the patient provided with contact information for the PCP's office or ED? Yes Was to pt encouraged to call back with questions or concerns? Yes

## 2023-03-04 ENCOUNTER — Ambulatory Visit (INDEPENDENT_AMBULATORY_CARE_PROVIDER_SITE_OTHER): Payer: Medicare Other | Admitting: Podiatry

## 2023-03-04 ENCOUNTER — Encounter: Payer: Self-pay | Admitting: Podiatry

## 2023-03-04 DIAGNOSIS — I739 Peripheral vascular disease, unspecified: Secondary | ICD-10-CM | POA: Diagnosis not present

## 2023-03-04 DIAGNOSIS — M79675 Pain in left toe(s): Secondary | ICD-10-CM

## 2023-03-04 DIAGNOSIS — M79674 Pain in right toe(s): Secondary | ICD-10-CM

## 2023-03-04 DIAGNOSIS — B351 Tinea unguium: Secondary | ICD-10-CM | POA: Diagnosis not present

## 2023-03-09 NOTE — Progress Notes (Signed)
  Subjective:  Patient ID: [name], female    DOB: [birth year],  MRN: [id number]  89 y.o. female presents at risk foot care. Patient has h/o PAD and painful thick toenails that are difficult to trim. Pain interferes with ambulation. Aggravating factors include wearing enclosed shoe gear. Pain is relieved with periodic professional debridement. Chief Complaint  Patient presents with   Nail Problem    RFC.   New problem(s): None   PCP is [name], Fritzi Mandes, MD , and last visit was February 10, 2023.  Allergies  Allergen Reactions   Aricept [Donepezil]     Nausea   Diphenhydramine Hcl Other (See Comments)    unknown   Penicillins Other (See Comments)    unknown   Shellfish Allergy Other (See Comments)    unknown   Statins     Myalgia.    Review of Systems: Negative except as noted in the HPI.   Objective:  [name] is a pleasant 89 y.o. female WD, WN in NAD.AAO x 3.  Vascular Examination: CFT <3 seconds b/l. Faintly palpable DP pulses b/l LE. Diminished PT pulse(s) b/l LE.. Skin temperature gradient warm to warm b/l. No pain with calf compression. No ischemia or gangrene. No cyanosis or clubbing noted b/l. Pedal hair absent. No edema noted b/l LE.   Neurological Examination: Sensation grossly intact b/l with 10 gram monofilament. Vibratory sensation intact b/l.   Dermatological Examination: Pedal skin warm and supple b/l. Toenails 1-5 b/l thick, discolored, elongated with subungual debris and pain on dorsal palpation.  No open wounds b/l LE. No interdigital macerations noted b/l LE.  Blanchable erythema noted medial bunion area of both feet and distal tip of left hallux. No edema, no warmth.  Musculoskeletal Examination: Muscle strength 5/5 to b/l LE. No pain, crepitus or joint limitation noted with ROM bilateral LE. HAV with bunion deformity noted b/l LE.  Radiographs: None  Last A1c:       No data to display         Assessment:   1. Pain due to  onychomycosis of toenails of both feet   2. Peripheral vascular disease (HCC)    Plan:  -Patient was evaluated today. All questions/concerns addressed on today's visit. -Continue supportive shoe gear daily. -Toenails 1-5 b/l were debrided in length and girth with sterile nail nippers and dremel without iatrogenic bleeding.  -Patient/POA to call should there be question/concern in the interim.  Return in about 3 months (around 06/04/2023).  Freddie Breech, DPM      [place] LOCATION: [address] [place] [address]                   Office [phone]   [facility] LOCATION: [address] [place], [place] [address] Office [phone]

## 2023-03-11 ENCOUNTER — Other Ambulatory Visit: Payer: Self-pay | Admitting: Family Medicine

## 2023-04-01 ENCOUNTER — Other Ambulatory Visit: Payer: Self-pay | Admitting: Cardiology

## 2023-04-29 ENCOUNTER — Other Ambulatory Visit: Payer: Self-pay | Admitting: Family Medicine

## 2023-05-04 NOTE — Progress Notes (Unsigned)
  Cardiology Office Note:   Date:  05/06/2023  ID:  [name], DOB 1925/06/29, MRN [id number] PCP: Cindy Ohara, MD  [facility] Providers Cardiologist:  Cindy Rotunda, MD {  History of Present Illness:   [name] is a 89 y.o. female who presents  for followup of known coronary disease. She was in the hospital in Jan 2018 with NSTEMI. She had an acute inferior lateral STEMI.   This was felt to be secondary to RCA occlusion. She had stenting of distal RCA into PDA with DES. The remainder of RCA was treated with POBA. She does have residual disease in mid LAD up to 90%. This is a heavily calcified vessel. Per Dr. [place]  the left main disease is significant.  The LAD disease is a complex lesion and would require atherectomy. Given her age the decision was to manage medically but if she has recurrent angina it was thought that it would be reasonable to consider PCI of LAD.  She was started on a statin. She came back to the hospital with chest pain but this time was found to have elevated liver enzymes.  She was taken off statin.     Since I last saw her she was in the ED with an impaction and elderberry pill.  She gets around slowly with a cane but still gets around the house and occasionally grocery shops.  She does have some chest discomfort but it is typically associated with swallowing or eating foods.      ROS: As stated in the HPI and negative for all other systems.  Studies Reviewed:    EKG:   EKG Interpretation Date/Time:  Tuesday May 06 2023 16:16:21 EST Ventricular Rate:  58 PR Interval:  172 QRS Duration:  72 QT Interval:  446 QTC Calculation: 437 R Axis:   -29  Text Interpretation: Sinus bradycardia with PACs When compared with ECG of 01-Aug-2021 18:04, No significant change since last tracing Confirmed by [name] ([id number]) on 05/06/2023 4:55:32 PM    Risk Assessment/Calculations:     Physical Exam:   VS:  BP (!) 140/68 (BP Location: Right Arm,  Patient Position: Sitting, Cuff Size: Normal)   Pulse (!) 58   Ht 5' (1.524 m)   Wt 92 lb (41.7 kg)   SpO2 97%   BMI 17.97 kg/m    Wt Readings from Last 3 Encounters:  05/06/23 92 lb (41.7 kg)  02/15/23 90 lb 6.2 oz (41 kg)  02/10/23 90 lb 9.6 oz (41.1 kg)     GEN: Well nourished, well developed in no acute distress NECK: No JVD; No carotid bruits CARDIAC: RRR, no murmurs, rubs, gallops RESPIRATORY:  Clear to auscultation without rales, wheezing or rhonchi  ABDOMEN: Soft, non-tender, non-distended EXTREMITIES:  No edema; No deformity   ASSESSMENT AND PLAN:   CAD:  The patient has no new sypmtoms.  No further cardiovascular testing is indicated.  We will continue with aggressive risk reduction and meds as listed.I am going to intentionally continue DAPT.  HTN:  The blood pressure is at target.  No change in therapy.  DYSLIPIDEMIA:     She has been intolerant of statins.  She does not want a PCSK9 and I am not inclined to prescribe at this age.     Follow up with me in one year  Signed, Cindy Rotunda, MD

## 2023-05-06 ENCOUNTER — Ambulatory Visit: Payer: Medicare Other | Attending: Cardiology | Admitting: Cardiology

## 2023-05-06 ENCOUNTER — Encounter: Payer: Self-pay | Admitting: Cardiology

## 2023-05-06 VITALS — BP 140/68 | HR 58 | Ht 60.0 in | Wt 92.0 lb

## 2023-05-06 DIAGNOSIS — E785 Hyperlipidemia, unspecified: Secondary | ICD-10-CM | POA: Diagnosis present

## 2023-05-06 DIAGNOSIS — I2510 Atherosclerotic heart disease of native coronary artery without angina pectoris: Secondary | ICD-10-CM | POA: Diagnosis not present

## 2023-05-06 DIAGNOSIS — I10 Essential (primary) hypertension: Secondary | ICD-10-CM | POA: Diagnosis present

## 2023-05-06 NOTE — Patient Instructions (Signed)
 Medication Instructions:  No changes.  *If you need a refill on your cardiac medications before your next appointment, please call your pharmacy*   Follow-Up: At [facility], you and your health needs are our priority.  As part of our continuing mission to provide you with exceptional heart care, we have created designated Provider Care Teams.  These Care Teams include your primary Cardiologist (physician) and Advanced Practice Providers (APPs -  Physician Assistants and Nurse Practitioners) who all work together to provide you with the care you need, when you need it.  We recommend signing up for the patient portal called "MyChart".  Sign up information is provided on this After Visit Summary.  MyChart is used to connect with patients for Virtual Visits (Telemedicine).  Patients are able to view lab/test results, encounter notes, upcoming appointments, etc.  Non-urgent messages can be sent to your provider as well.   To learn more about what you can do with MyChart, go to [email or web address].    Your next appointment:   12 month(s)  Provider:   Rollene Rotunda, MD

## 2023-05-27 ENCOUNTER — Other Ambulatory Visit: Payer: Self-pay | Admitting: Cardiology

## 2023-05-27 ENCOUNTER — Other Ambulatory Visit: Payer: Self-pay | Admitting: Physician Assistant

## 2023-05-27 DIAGNOSIS — R451 Restlessness and agitation: Secondary | ICD-10-CM

## 2023-05-28 ENCOUNTER — Other Ambulatory Visit: Payer: Self-pay

## 2023-06-15 NOTE — Progress Notes (Unsigned)
 Subjective:  Patient ID: [name], female    DOB: [birth year]  Age: 89 y.o. MRN: [id number]  Chief Complaint  Patient presents with   Medical Management of Chronic Issues    HPI Hypertensive heart disease: Norvasc 5 mg daily, Metoprolol 25 mg (75 mg total-take 2 tablets in the a.m. and 1 qhs). HX of MI, CAD, PVD. Brilinta 90 mg twice daily, and baby aspirin.        Hyperlipidemia: patient is not taking zocor 40 mg daily, but developed muscle pain. Patient has dementia.   GERD: Pepcid 40 mg daily. Occasional heartburn. Sometimes has to take a tums.    Dementia/Depression: Remeron 7.5 mg qhs   Alzheimer Disease: very poor memory.    Back pain: Takes tramadol about once a week.   CKD 3B:  Sees nephrology.      06/16/2023    2:52 PM 02/10/2023    1:59 PM 10/10/2022    2:15 PM 07/04/2022    2:40 PM 08/22/2021    3:07 PM  Depression screen PHQ 2/9  Decreased Interest 0 0 1 0 0  Down, Depressed, Hopeless 0 0 1 0 0  PHQ - 2 Score 0 0 2 0 0  Altered sleeping 0  0    Tired, decreased energy 0  0    Change in appetite 0  0    Feeling bad or failure about yourself  0  0    Trouble concentrating 0  0    Moving slowly or fidgety/restless 0  0    Suicidal thoughts 0  0    PHQ-9 Score 0  2    Difficult doing work/chores Not difficult at all  Somewhat difficult          02/10/2023    1:59 PM  Fall Risk   Falls in the past year? 0  Number falls in past yr: 0  Injury with Fall? 0  Risk for fall due to : Impaired balance/gait;Impaired mobility  Follow up Falls evaluation completed;Falls prevention discussed    Patient Care Team: Blane Ohara, MD as PCP - General (Family Medicine) Rollene Rotunda, MD as PCP - Cardiology (Cardiology) Zettie Pho, [facility] (Inactive) (Pharmacist) Anthony Sar, MD as Consulting Physician (Nephrology) Talmage Coin, MD as Consulting Physician (Endocrinology)   Review of Systems  Constitutional:  Negative for chills, fatigue and fever.   HENT:  Negative for congestion, ear pain, rhinorrhea and sore throat.   Respiratory:  Negative for cough and shortness of breath.   Cardiovascular:  Negative for chest pain.  Gastrointestinal:  Negative for abdominal pain, constipation, diarrhea, nausea and vomiting.  Genitourinary:  Negative for dysuria and urgency.  Musculoskeletal:  Negative for back pain and myalgias.  Neurological:  Negative for dizziness, weakness, light-headedness and headaches.  Psychiatric/Behavioral:  Negative for dysphoric mood. The patient is not nervous/anxious.     Current Outpatient Medications on File Prior to Visit  Medication Sig Dispense Refill   Ascorbic Acid (VITAMIN C) 100 MG tablet Take by mouth daily.     aspirin EC 81 MG tablet Take 1 tablet (81 mg total) by mouth daily. Swallow whole. 90 tablet 3   b complex vitamins tablet Take 1 tablet by mouth daily.     BRILINTA 90 MG TABS tablet TAKE 1 TABLET BY MOUTH TWICE A DAY 180 tablet 2   cinacalcet (SENSIPAR) 30 MG tablet Take 30 mg by mouth 2 (two) times daily.     Coenzyme Q10 (COQ10) 50 MG  CAPS Take 50 mg by mouth daily.     dorzolamide-timolol (COSOPT) 2-0.5 % ophthalmic solution Place 2 drops into the left eye at bedtime. 10 mL 0   ELDERBERRY PO Take by mouth daily.     MAGNESIUM GLUCONATE PO Take by mouth daily.     nitroGLYCERIN (NITROSTAT) 0.4 MG SL tablet Place 1 tablet (0.4 mg total) under the tongue every 5 (five) minutes as needed. For chest pain 25 tablet 2   [DISCONTINUED] simvastatin (ZOCOR) 40 MG tablet Take 1 tablet (40 mg total) by mouth at bedtime. 30 tablet 0   No current facility-administered medications on file prior to visit.   Past Medical History:  Diagnosis Date   Abscess 2003   I&D SCM abscess   Acute ischemic colitis (HCC) 04/10/2011   acute episode of ischemic colitis, with abd pain, CT evidence of transverse colitis, and blood streaked BM.  Resolved with conservative treatment.   Arthritis    "hands, joints, knees"  (07/19/2016)   Bacteremia 2003   hx of staph bacteremia   CAD (coronary artery disease) 2003, 2010   s/p stenting of RCA x2, s/p stent to OM;  LHC 04/2009 in the setting of a NSTEMI: EF 65%, RCA stent patent with 60% ISR proximal and 40% ISR mid, PLV proximal 50-60% and distal 50%, ostial circumflex 30-40%, proximal OM mild in-stent restenosis with a branch with 90+ percent ostial stenosis, proximal LAD 30-40%.  FFR of ostial circumflex:  0.96 - not flow limiting.  Medical therapy was continued.   Chronic lower back pain    "not a big problem" (07/19/2016)   Closed displaced fracture of distal phalanx of left middle finger 05/23/2017   Degenerative disc disease    Finger infection    GERD (gastroesophageal reflux disease)    History of medication noncompliance    concerning Plavix   HTN (hypertension)    Echocardiogram 08/26/11: Mild focal basal septal hypertrophy, EF 55-60%, grade 2 diastolic dysfunction, mild MR.  There was a fluid-filled area likely in the location of the liver noted   Hyperlipidemia    Hyperparathyroidism 2003   parathyroid adenoma localized to the right inferior thyroid lobe region   Hypothyroidism    Left renal artery stenosis (HCC)    40%   Lumbar stenosis with neurogenic claudication 2006   s/p decompression L2-L5   Myocardial infarction (HCC) ~ 2013; 05/06/2016   Osteoporosis 2009   diagnosed by bone density scan in 2009, on Vit D only, no bisphosphonate therapy   Phlebitis    LLE   PVD (peripheral vascular disease) (HCC)    s/p left fem-pop bypass graft   TIA (transient ischemic attack)    carotid dopplers 5/13: no ICA stenosis   Past Surgical History:  Procedure Laterality Date   AMPUTATION FINGER Left 05/24/2017   Procedure: AMPUTATION tip of long FINGER;  Surgeon: Knute Neu, MD;  Location: [facility] OR;  Service: Plastics;  Laterality: Left;   CARDIAC CATHETERIZATION N/A 05/06/2016   Procedure: Left Heart Cath and Coronary Angiography;  Surgeon: Runell Gess, MD;  Location: [facility] INVASIVE CV LAB;  Service: Cardiovascular;  Laterality: N/A;   CARDIAC CATHETERIZATION N/A 05/06/2016   Procedure: Coronary Stent Intervention;  Surgeon: Runell Gess, MD;  Location: [facility] INVASIVE CV LAB;  Service: Cardiovascular;  Laterality: N/A;  RCA   CATARACT EXTRACTION, BILATERAL Bilateral    CORONARY ANGIOPLASTY WITH STENT PLACEMENT  09/2008   CORONARY ANGIOPLASTY WITH STENT PLACEMENT  04/2001   ENDOSCOPIC HEMILAMINOTOMY  W/ DISCECTOMY LUMBAR  06/2004   L3, L4 lami; L2, L5 hemilami; decompresion L2-3, 3-4, 4-   FEMORAL BYPASS  before 2003   LLE   I & D EXTREMITY Left 05/24/2017   Procedure: IRRIGATION AND DEBRIDEMENT LEFT LONG FINGER;  Surgeon: Knute Neu, MD;  Location: [facility] OR;  Service: Plastics;  Laterality: Left;   INCISION AND DRAINAGE ANTERIOR NECK  10/2001   absces sternoclavicular joint    Family History  Problem Relation Age of Onset   Stroke Mother    Stroke Father    Breast cancer Daughter    Social History   Socioeconomic History   Marital status: Widowed    Spouse name: Not on file   Number of children: Not on file   Years of education: Not on file   Highest education level: Not on file  Occupational History   Not on file  Tobacco Use   Smoking status: Never   Smokeless tobacco: Never  Vaping Use   Vaping status: Never Used  Substance and Sexual Activity   Alcohol use: No   Drug use: No   Sexual activity: Never  Other Topics Concern   Not on file  Social History Narrative   Lives with daughter and granddaughter   Social Drivers of Health   Financial Resource Strain: Low Risk  (10/10/2022)   Overall Financial Resource Strain (CARDIA)    Difficulty of Paying Living Expenses: Not hard at all  Food Insecurity: No Food Insecurity (10/10/2022)   Hunger Vital Sign    Worried About Running Out of Food in the Last Year: Never true    Ran Out of Food in the Last Year: Never true  Transportation Needs: No Transportation Needs  (10/10/2022)   PRAPARE - Administrator, Civil Service (Medical): No    Lack of Transportation (Non-Medical): No  Physical Activity: Inactive (10/10/2022)   Exercise Vital Sign    Days of Exercise per Week: 0 days    Minutes of Exercise per Session: 0 min  Stress: No Stress Concern Present (10/10/2022)   [employer] of Occupational Health - Occupational Stress Questionnaire    Feeling of Stress : Not at all  Social Connections: Moderately Isolated (06/16/2023)   Social Connection and Isolation Panel [NHANES]    Frequency of Communication with Friends and Family: More than three times a week    Frequency of Social Gatherings with Friends and Family: More than three times a week    Attends Religious Services: More than 4 times per year    Active Member of [employer] or Organizations: No    Attends Banker Meetings: Never    Marital Status: Widowed    Objective:  BP 130/64   Pulse (!) 59   Temp 97.8 F (36.6 C)   Ht 5' 1.5" (1.562 m)   Wt 91 lb (41.3 kg)   SpO2 96%   BMI 16.92 kg/m      06/16/2023    2:43 PM 05/06/2023    4:12 PM 02/15/2023    7:45 PM  BP/Weight  Systolic BP 130 140 164  Diastolic BP 64 68 77  Wt. (Lbs) 91 92   BMI 16.92 kg/m2 17.97 kg/m2     Physical Exam Vitals reviewed.  Constitutional:      Appearance: Normal appearance.     Comments: frail  Neck:     Vascular: No carotid bruit.  Cardiovascular:     Rate and Rhythm: Normal rate and regular rhythm.  Heart sounds: Normal heart sounds.  Pulmonary:     Effort: Pulmonary effort is normal. No respiratory distress.     Breath sounds: Normal breath sounds.  Abdominal:     General: Abdomen is flat. Bowel sounds are normal.     Palpations: Abdomen is soft.     Tenderness: There is no abdominal tenderness.  Neurological:     Mental Status: She is alert.  Psychiatric:        Mood and Affect: Mood normal.        Behavior: Behavior normal.     Diabetic Foot Exam - Simple    No data filed      Lab Results  Component Value Date   WBC 6.7 02/15/2023   HGB 10.7 (L) 02/15/2023   HCT 34.1 (L) 02/15/2023   PLT 162 02/15/2023   GLUCOSE 98 02/15/2023   CHOL 313 (H) 02/10/2023   TRIG 156 (H) 02/10/2023   HDL 71 02/10/2023   LDLCALC 213 (H) 02/10/2023   ALT 14 02/15/2023   AST 21 02/15/2023   NA 138 02/15/2023   K 4.3 02/15/2023   CL 107 02/15/2023   CREATININE 2.14 (H) 02/15/2023   BUN 36 (H) 02/15/2023   CO2 21 (L) 02/15/2023   TSH 0.653 07/04/2022   INR 1.09 06/25/2016   HGBA1C 5.9 (H) 08/26/2011      Assessment & Plan:    Hypertensive heart disease with heart failure [facility]) Assessment & Plan: The current medical regimen is effective;  continue present plan and medications. Continue Norvasc 5 mg daily, Metoprolol 25 mg (75 mg total-take 2 tablets in the a.m. and 1 qhs)  Orders: -     amLODIPine Besylate; Take 1 tablet (5 mg total) by mouth daily.  Dispense: 90 tablet; Refill: 1 -     Metoprolol Succinate ER; Take 3 tablets (75 mg total) by mouth daily.  Dispense: 270 tablet; Refill: 0  Gastroesophageal reflux disease without esophagitis Assessment & Plan: The current medical regimen is effective;  continue present plan and medications. Continue pepcid 40 mg daily.   Orders: -     Famotidine; Take 1 tablet (40 mg total) by mouth daily.  Dispense: 90 tablet; Refill: 2  Alzheimer disease (HCC) Assessment & Plan: Prefers no medicines.    Mixed hyperlipidemia Assessment & Plan: Statins are not indicated due to progressed dementia. Also patient does not tolerate them. She and her daughter do not wish to pursue treatment.    Lumbar back pain -     traMADol HCl; Take 2 tablets (100 mg total) by mouth daily.  Dispense: 30 tablet; Refill: 1  Primary insomnia -     Mirtazapine; Take 1 tablet (7.5 mg total) by mouth at bedtime.  Dispense: 90 tablet; Refill: 2  Other orders -     Triamcinolone Acetonide; Apply 1 Application topically 2 (two)  times daily. Both hands  Dispense: 30 g; Refill: 1     Meds ordered this encounter  Medications   triamcinolone cream (KENALOG) 0.1 %    Sig: Apply 1 Application topically 2 (two) times daily. Both hands    Dispense:  30 g    Refill:  1   amLODipine (NORVASC) 5 MG tablet    Sig: Take 1 tablet (5 mg total) by mouth daily.    Dispense:  90 tablet    Refill:  1   famotidine (PEPCID) 40 MG tablet    Sig: Take 1 tablet (40 mg total) by mouth daily.  Dispense:  90 tablet    Refill:  2   metoprolol succinate (TOPROL-XL) 25 MG 24 hr tablet    Sig: Take 3 tablets (75 mg total) by mouth daily.    Dispense:  270 tablet    Refill:  0   mirtazapine (REMERON) 7.5 MG tablet    Sig: Take 1 tablet (7.5 mg total) by mouth at bedtime.    Dispense:  90 tablet    Refill:  2   traMADol (ULTRAM) 50 MG tablet    Sig: Take 2 tablets (100 mg total) by mouth daily.    Dispense:  30 tablet    Refill:  1    Not to exceed 5 additional fills before 08/13/2021    No orders of the defined types were placed in this encounter.    Follow-up: Return in about 6 months (around 12/17/2023) for chronic follow up.   I,Marla I Leal-Borjas,acting as a scribe for Blane Ohara, MD.,have documented all relevant documentation on the behalf of Blane Ohara, MD,as directed by  Blane Ohara, MD while in the presence of Blane Ohara, MD.   An After Visit Summary was printed and given to the patient.  I attest that I have reviewed this visit and agree with the plan scribed by my staff.   Blane Ohara, MD [facility] [phone]

## 2023-06-16 ENCOUNTER — Encounter: Payer: Self-pay | Admitting: Family Medicine

## 2023-06-16 ENCOUNTER — Ambulatory Visit (INDEPENDENT_AMBULATORY_CARE_PROVIDER_SITE_OTHER): Payer: Medicare Other | Admitting: Family Medicine

## 2023-06-16 VITALS — BP 130/64 | HR 59 | Temp 97.8°F | Ht 61.5 in | Wt 91.0 lb

## 2023-06-16 DIAGNOSIS — E782 Mixed hyperlipidemia: Secondary | ICD-10-CM

## 2023-06-16 DIAGNOSIS — K219 Gastro-esophageal reflux disease without esophagitis: Secondary | ICD-10-CM | POA: Diagnosis not present

## 2023-06-16 DIAGNOSIS — F5101 Primary insomnia: Secondary | ICD-10-CM | POA: Insufficient documentation

## 2023-06-16 DIAGNOSIS — I110 Hypertensive heart disease with heart failure: Secondary | ICD-10-CM | POA: Diagnosis not present

## 2023-06-16 DIAGNOSIS — G309 Alzheimer's disease, unspecified: Secondary | ICD-10-CM | POA: Diagnosis not present

## 2023-06-16 DIAGNOSIS — F0280 Dementia in other diseases classified elsewhere without behavioral disturbance: Secondary | ICD-10-CM

## 2023-06-16 DIAGNOSIS — M545 Low back pain, unspecified: Secondary | ICD-10-CM | POA: Insufficient documentation

## 2023-06-16 MED ORDER — MIRTAZAPINE 7.5 MG PO TABS: 7.5000 mg | ORAL_TABLET | Freq: Every day | ORAL | 2 refills | Status: DC

## 2023-06-16 MED ORDER — FAMOTIDINE 40 MG PO TABS: 40.0000 mg | ORAL_TABLET | Freq: Every day | ORAL | 2 refills | Status: DC

## 2023-06-16 MED ORDER — METOPROLOL SUCCINATE ER 25 MG PO TB24: 75.0000 mg | ORAL_TABLET | Freq: Every day | ORAL | 0 refills | Status: DC

## 2023-06-16 MED ORDER — TRIAMCINOLONE ACETONIDE 0.1 % EX CREA: 1.0000 | TOPICAL_CREAM | Freq: Two times a day (BID) | CUTANEOUS | 1 refills | Status: DC

## 2023-06-16 MED ORDER — AMLODIPINE BESYLATE 5 MG PO TABS: 5.0000 mg | ORAL_TABLET | Freq: Every day | ORAL | 1 refills | Status: DC

## 2023-06-16 MED ORDER — TRAMADOL HCL 50 MG PO TABS: 100.0000 mg | ORAL_TABLET | Freq: Every day | ORAL | 1 refills | Status: AC

## 2023-06-16 NOTE — Assessment & Plan Note (Signed)
The current medical regimen is effective;  continue present plan and medications. 

## 2023-06-16 NOTE — Assessment & Plan Note (Signed)
The current medical regimen is effective;  continue present plan and medications. Continue Norvasc 5 mg daily, Metoprolol 25 mg (75 mg total-take 2 tablets in the a.m. and 1 qhs)

## 2023-06-16 NOTE — Assessment & Plan Note (Signed)
Statins are not indicated due to progressed dementia. Also patient does not tolerate them. She and her daughter do not wish to pursue treatment.

## 2023-06-16 NOTE — Assessment & Plan Note (Signed)
Prefers no medicines.

## 2023-07-13 ENCOUNTER — Other Ambulatory Visit: Payer: Self-pay | Admitting: Family Medicine

## 2023-07-15 ENCOUNTER — Ambulatory Visit: Payer: Medicare Other | Admitting: Podiatry

## 2023-07-15 ENCOUNTER — Encounter: Payer: Self-pay | Admitting: Podiatry

## 2023-07-15 VITALS — Ht 61.5 in | Wt 91.0 lb

## 2023-07-15 DIAGNOSIS — B351 Tinea unguium: Secondary | ICD-10-CM | POA: Diagnosis not present

## 2023-07-15 DIAGNOSIS — M79674 Pain in right toe(s): Secondary | ICD-10-CM

## 2023-07-15 DIAGNOSIS — I739 Peripheral vascular disease, unspecified: Secondary | ICD-10-CM

## 2023-07-15 DIAGNOSIS — M79675 Pain in left toe(s): Secondary | ICD-10-CM

## 2023-07-20 NOTE — Progress Notes (Signed)
  Subjective:  Patient ID: [name], female    DOB: Jun 16, 1925,  MRN: [id number]  [name] presents to clinic today for: at risk foot care. Patient has h/o PAD and painful mycotic toenails x 10 which interfere with daily activities. Pain is relieved with periodic professional debridement.  Chief Complaint  Patient presents with   RFC    She is here for a nail trim, PCP is [name] and seen 06/17/23,     PCP is [name], Cindy Mandes, MD.  Allergies  Allergen Reactions   Aricept [Donepezil]     Nausea   Diphenhydramine Hcl Other (See Comments)    unknown   Penicillins Other (See Comments)    unknown   Shellfish Allergy Other (See Comments)    unknown   Statins     Myalgia.    Review of Systems: Negative except as noted in the HPI.  Objective: No changes noted in today's physical examination. There were no vitals filed for this visit.  [name] is a pleasant 89 y.o. female WD, WN in NAD. AAO x 3.  Vascular Examination: CFT <3 seconds b/l. DP pulses faintly palpable b/l. PT pulses nonpalpable b/l. Digital hair absent. Skin temperature gradient warm to warm b/l. No pain with calf compression. No ischemia or gangrene. No cyanosis or clubbing noted b/l.    Neurological Examination: Sensation grossly intact b/l with 10 gram monofilament.   Dermatological Examination: Pedal skin warm and supple b/l. No open wounds b/l. No interdigital macerations. Toenails 1-5 b/l thick, discolored, elongated with subungual debris and pain on dorsal palpation.  No hyperkeratotic nor porokeratotic lesions present on today's visit.  Musculoskeletal Examination: HAV with bunion deformity noted b/l LE.  Radiographs: None  Assessment/Plan: 1. Pain due to onychomycosis of toenails of both feet   2. Peripheral vascular disease [facility])     Consent given for treatment. Patient examined. All patient's and/or POA's questions/concerns addressed on today's visit. Mycotic toenails 1-5 debrided  in length and girth without incident. Continue soft, supportive shoe gear daily. Report any pedal injuries to medical professional. Call office if there are any quesitons/concerns. -Patient/POA to call should there be question/concern in the interim.   Return in about 3 months (around 10/14/2023).  Freddie Breech, DPM      [facility] LOCATION: [address], [place] [address]                   Office [phone]   [facility] LOCATION: [address] [place], [place] [address] Office [phone]

## 2023-08-14 ENCOUNTER — Telehealth: Payer: Self-pay | Admitting: Cardiology

## 2023-08-14 NOTE — Telephone Encounter (Signed)
 Left voicemail to return call to office

## 2023-08-14 NOTE — Telephone Encounter (Signed)
 Daughter [name]) wants to take patient to [place], [place] for a graduation and wants to know if they stop for breaks every 2 hours would this be safe for the patient to travel.

## 2023-08-18 NOTE — Telephone Encounter (Signed)
 Left voicemail to return call to office

## 2023-09-02 ENCOUNTER — Other Ambulatory Visit: Payer: Self-pay | Admitting: Family Medicine

## 2023-09-26 ENCOUNTER — Other Ambulatory Visit: Payer: Self-pay | Admitting: Family Medicine

## 2023-09-26 DIAGNOSIS — I110 Hypertensive heart disease with heart failure: Secondary | ICD-10-CM

## 2023-10-16 ENCOUNTER — Ambulatory Visit

## 2023-10-16 ENCOUNTER — Other Ambulatory Visit: Payer: Self-pay | Admitting: Family Medicine

## 2023-10-16 ENCOUNTER — Telehealth: Payer: Self-pay

## 2023-10-16 VITALS — Ht 61.5 in | Wt 91.0 lb

## 2023-10-16 DIAGNOSIS — I110 Hypertensive heart disease with heart failure: Secondary | ICD-10-CM

## 2023-10-16 DIAGNOSIS — Z0000 Encounter for general adult medical examination without abnormal findings: Secondary | ICD-10-CM

## 2023-10-16 NOTE — Progress Notes (Signed)
 Subjective:   [name] is a 89 y.o. who presents for a Medicare Wellness preventive visit.  As a reminder, Annual Wellness Visits don't include a physical exam, and some assessments may be limited, especially if this visit is performed virtually. We may recommend an in-person follow-up visit with your provider if needed.  Visit Complete: Virtual I connected with  [name] on 10/16/23 by a audio enabled telemedicine application and verified that I am speaking with the correct person using two identifiers.  Patient Location: Home  Provider Location: Home Office  I discussed the limitations of evaluation and management by telemedicine. The patient expressed understanding and agreed to proceed.  Vital Signs: Because this visit was a virtual/telehealth visit, some criteria may be missing or patient reported. Any vitals not documented were not able to be obtained and vitals that have been documented are patient reported.  VideoDeclined- This patient declined Librarian, academic. Therefore the visit was completed with audio only.  Persons Participating in Visit: Patient assisted by granddaughter Odella .  AWV Questionnaire: Yes: Patient Medicare AWV questionnaire was completed by the patient on 10/15/23; I have confirmed that all information answered by patient is correct and no changes since this date.  Cardiac Risk Factors include: advanced age (>75men, >52 women);sedentary lifestyle     Objective:    Today's Vitals   10/16/23 1349  Weight: 91 lb (41.3 kg)  Height: 5' 1.5 (1.562 m)   Body mass index is 16.92 kg/m.     10/16/2023    2:29 PM 02/15/2023    6:52 PM 08/22/2021    3:09 PM 08/01/2021    2:04 PM 03/30/2018    4:26 PM 03/30/2018    4:22 PM 05/23/2017   12:49 PM  Advanced Directives  Does Patient Have a Medical Advance Directive? No Yes No No Yes  Yes  Yes   Type of Furniture conservator/restorer;Living will    Healthcare Power of [employer] Power of [employer] Power of Attorney  Does patient want to make changes to medical advance directive?     No - Patient declined     Copy of Healthcare Power of Attorney in Chart?     No - copy requested  No - copy requested    Would patient like information on creating a medical advance directive? Yes (MAU/Ambulatory/Procedural Areas - Information given)  No - Patient declined No - Patient declined No - Patient declined  No - Patient declined       Data saved with a previous flowsheet row definition    Current Medications (verified) Outpatient Encounter Medications as of 10/16/2023  Medication Sig   amLODipine  (NORVASC ) 5 MG tablet Take 1 tablet (5 mg total) by mouth daily.   Ascorbic Acid (VITAMIN C) 100 MG tablet Take by mouth daily.   aspirin  EC 81 MG tablet Take 1 tablet (81 mg total) by mouth daily. Swallow whole.   b complex vitamins tablet Take 1 tablet by mouth daily.   BRILINTA  90 MG TABS tablet TAKE 1 TABLET BY MOUTH TWICE A DAY   cinacalcet (SENSIPAR) 30 MG tablet Take 30 mg by mouth 2 (two) times daily.   Coenzyme Q10 (COQ10) 50 MG CAPS Take 50 mg by mouth daily.   dorzolamide -timolol  (COSOPT ) 2-0.5 % ophthalmic solution Place 2 drops into the left eye at bedtime.   ELDERBERRY PO Take by mouth daily.   famotidine  (PEPCID ) 40 MG tablet Take 1 tablet (  40 mg total) by mouth daily.   MAGNESIUM GLUCONATE PO Take by mouth daily.   metoprolol  succinate (TOPROL -XL) 25 MG 24 hr tablet TAKE 3 TABLETS BY MOUTH DAILY   mirtazapine  (REMERON ) 7.5 MG tablet Take 1 tablet (7.5 mg total) by mouth at bedtime.   nitroGLYCERIN  (NITROSTAT ) 0.4 MG SL tablet Place 1 tablet (0.4 mg total) under the tongue every 5 (five) minutes as needed. For chest pain   traMADol  (ULTRAM ) 50 MG tablet Take 2 tablets (100 mg total) by mouth daily.   triamcinolone  cream (KENALOG ) 0.1 % APPLY 1 APPLICATION TOPICALLY 2 (TWO) TIMES DAILY. BOTH HANDS   [DISCONTINUED]  simvastatin  (ZOCOR ) 40 MG tablet Take 1 tablet (40 mg total) by mouth at bedtime.   No facility-administered encounter medications on file as of 10/16/2023.    Allergies (verified) Aricept  [donepezil ], Diphenhydramine hcl, Penicillins, Shellfish allergy, and Statins   History: Past Medical History:  Diagnosis Date   Abscess 2003   I&D SCM abscess   Acute ischemic colitis (HCC) 04/10/2011   acute episode of ischemic colitis, with abd pain, CT evidence of transverse colitis, and blood streaked BM.  Resolved with conservative treatment.   Allergy    Anxiety    From dementia   Arthritis    hands, joints, knees (07/19/2016)   Bacteremia 2003   hx of staph bacteremia   CAD (coronary artery disease) 2003, 2010   s/p stenting of RCA x2, s/p stent to OM;  LHC 04/2009 in the setting of a NSTEMI: EF 65%, RCA stent patent with 60% ISR proximal and 40% ISR mid, PLV proximal 50-60% and distal 50%, ostial circumflex 30-40%, proximal OM mild in-stent restenosis with a branch with 90+ percent ostial stenosis, proximal LAD 30-40%.  FFR of ostial circumflex:  0.96 - not flow limiting.  Medical therapy was continued.   Cataract    Chronic kidney disease    Chronic lower back pain    not a big problem (07/19/2016)   Closed displaced fracture of distal phalanx of left middle finger 05/23/2017   Degenerative disc disease    Finger infection    GERD (gastroesophageal reflux disease)    Glaucoma    History of medication noncompliance    concerning Plavix    HTN (hypertension)    Echocardiogram 08/26/11: Mild focal basal septal hypertrophy, EF 55-60%, grade 2 diastolic dysfunction, mild MR.  There was a fluid-filled area likely in the location of the liver noted   Hyperlipidemia    Hyperparathyroidism 2003   parathyroid  adenoma localized to the right inferior thyroid  lobe region   Hypothyroidism    Left renal artery stenosis (HCC)    40%   Lumbar stenosis with neurogenic claudication 2006   s/p  decompression L2-L5   Myocardial infarction (HCC) ~ 2013; 05/06/2016   Osteoporosis 2009   diagnosed by bone density scan in 2009, on Vit D only, no bisphosphonate therapy   Phlebitis    LLE   PVD (peripheral vascular disease) (HCC)    s/p left fem-pop bypass graft   Stroke [facility])    In the past   TIA (transient ischemic attack)    carotid dopplers 5/13: no ICA stenosis   Past Surgical History:  Procedure Laterality Date   AMPUTATION FINGER Left 05/24/2017   Procedure: AMPUTATION tip of long FINGER;  Surgeon: Lorretta Dess, MD;  Location: [facility] OR;  Service: Plastics;  Laterality: Left;   CARDIAC CATHETERIZATION N/A 05/06/2016   Procedure: Left Heart Cath and Coronary Angiography;  Surgeon: Dorn  JINNY Lesches, MD;  Location: [facility] INVASIVE CV LAB;  Service: Cardiovascular;  Laterality: N/A;   CARDIAC CATHETERIZATION N/A 05/06/2016   Procedure: Coronary Stent Intervention;  Surgeon: Dorn JINNY Lesches, MD;  Location: [facility] INVASIVE CV LAB;  Service: Cardiovascular;  Laterality: N/A;  RCA   CATARACT EXTRACTION, BILATERAL Bilateral    CORONARY ANGIOPLASTY WITH STENT PLACEMENT  09/2008   CORONARY ANGIOPLASTY WITH STENT PLACEMENT  04/2001   CORONARY ARTERY BYPASS GRAFT     ENDOSCOPIC HEMILAMINOTOMY W/ DISCECTOMY LUMBAR  06/2004   L3, L4 lami; L2, L5 hemilami; decompresion L2-3, 3-4, 4-   EYE SURGERY     For cataracts and glaucoma   FEMORAL BYPASS  before 2003   LLE   I & D EXTREMITY Left 05/24/2017   Procedure: IRRIGATION AND DEBRIDEMENT LEFT LONG FINGER;  Surgeon: Lorretta Dess, MD;  Location: [facility] OR;  Service: Plastics;  Laterality: Left;   INCISION AND DRAINAGE ANTERIOR NECK  10/2001   absces sternoclavicular joint   Family History  Problem Relation Age of Onset   Stroke Mother    Heart disease Mother    Stroke Father    Breast cancer Daughter    Varicose Veins Daughter    Social History   Socioeconomic History   Marital status: Widowed    Spouse name: Not on file   Number of  children: Not on file   Years of education: Not on file   Highest education level: 12th grade  Occupational History   Not on file  Tobacco Use   Smoking status: Never   Smokeless tobacco: Never  Vaping Use   Vaping status: Never Used  Substance and Sexual Activity   Alcohol use: No   Drug use: Never   Sexual activity: Not Currently    Birth control/protection: None  Other Topics Concern   Not on file  Social History Narrative   Lives with daughter and granddaughter   Social Drivers of Health   Financial Resource Strain: Medium Risk (10/15/2023)   Overall Financial Resource Strain (CARDIA)    Difficulty of Paying Living Expenses: Somewhat hard  Food Insecurity: Unknown (10/15/2023)   Hunger Vital Sign    Worried About Running Out of Food in the Last Year: Patient declined    Ran Out of Food in the Last Year: Never true  Transportation Needs: No Transportation Needs (10/15/2023)   PRAPARE - Administrator, Civil Service (Medical): No    Lack of Transportation (Non-Medical): No  Physical Activity: Inactive (10/15/2023)   Exercise Vital Sign    Days of Exercise per Week: 0 days    Minutes of Exercise per Session: 0 min  Stress: No Stress Concern Present (10/15/2023)   [employer] of Occupational Health - Occupational Stress Questionnaire    Feeling of Stress: Only a little  Social Connections: Moderately Isolated (10/15/2023)   Social Connection and Isolation Panel    Frequency of Communication with Friends and Family: More than three times a week    Frequency of Social Gatherings with Friends and Family: Once a week    Attends Religious Services: 1 to 4 times per year    Active Member of [employer] or Organizations: No    Attends Banker Meetings: Never    Marital Status: Widowed    Tobacco Counseling Counseling given: Not Answered    Clinical Intake:  Pre-visit preparation completed: Yes  Pain : No/denies pain     Diabetes: No  Lab Results   Component Value  Date   HGBA1C 5.9 (H) 08/26/2011     How often do you need to have someone help you when you read instructions, pamphlets, or other written materials from your doctor or pharmacy?: 1 - Never  Interpreter Needed?: No  Information entered by :: Charmaine Bloodgood LPN   Activities of Daily Living     10/15/2023    9:05 AM  In your present state of health, do you have any difficulty performing the following activities:  Hearing? 0  Vision? 0  Difficulty concentrating or making decisions? 1  Walking or climbing stairs? 1  Dressing or bathing? 1  Doing errands, shopping? 1  Preparing Food and eating ? N  Using the Toilet? N  In the past six months, have you accidently leaked urine? Y  Do you have problems with loss of bowel control? N  Managing your Medications? Y  Managing your Finances? Y  Housekeeping or managing your Housekeeping? Y    Patient Care Team: Sherre Clapper, MD as PCP - General (Family Medicine) Lavona Agent, MD as PCP - Cardiology (Cardiology) Nyle Rankin [facility], [facility] (Inactive) (Pharmacist) Dennise Hoes, MD as Consulting Physician (Nephrology) Faythe Purchase, MD as Consulting Physician (Endocrinology)  I have updated your Care Teams any recent Medical Services you may have received from other providers in the past year.     Assessment:   This is a routine wellness examination for Inari.  Hearing/Vision screen Hearing Screening - Comments:: Denies hearing difficulties   Vision Screening - Comments:: No vision problems   Goals Addressed             This Visit's Progress    Prevent falls   On track    10/10/2022 AWV Goal: Fall Prevention  Over the next year, patient will decrease their risk for falls by: Using assistive devices, such as a cane or walker, as needed Identifying fall risks within their home and correcting them by: Removing throw rugs Adding handrails to stairs or ramps Removing clutter and keeping a clear pathway  throughout the home Increasing light, especially at night Adding shower handles/bars Raising toilet seat Identifying potential personal risk factors for falls: Medication side effects Incontinence/urgency Vestibular dysfunction Hearing loss Musculoskeletal disorders Neurological disorders Orthostatic hypotension         Depression Screen     10/16/2023    2:31 PM 06/16/2023    2:52 PM 02/10/2023    1:59 PM 10/10/2022    2:15 PM 07/04/2022    2:40 PM 08/22/2021    3:07 PM 12/05/2020    3:02 PM  PHQ 2/9 Scores  PHQ - 2 Score 0 0 0 2 0 0 0  PHQ- 9 Score  0  2       Fall Risk     10/15/2023    9:05 AM 02/10/2023    1:59 PM 10/10/2022    2:16 PM 07/04/2022    2:40 PM 08/22/2021    3:10 PM  Fall Risk   Falls in the past year? 0 0 0 0 0  Number falls in past yr: 0 0 0 0 0  Injury with Fall? 0 0 0 0 0  Risk for fall due to : Impaired balance/gait;Impaired mobility;History of fall(s) Impaired balance/gait;Impaired mobility Impaired balance/gait No Fall Risks;Impaired balance/gait Impaired balance/gait  Follow up Falls evaluation completed;Education provided;Falls prevention discussed Falls evaluation completed;Falls prevention discussed Falls evaluation completed;Education provided Falls evaluation completed Falls prevention discussed      Data saved with a previous flowsheet row  definition    MEDICARE RISK AT HOME:  Medicare Risk at Home Any stairs in or around the home?: (Patient-Rptd) No If so, are there any without handrails?: (Patient-Rptd) No Home free of loose throw rugs in walkways, pet beds, electrical cords, etc?: (Patient-Rptd) Yes Adequate lighting in your home to reduce risk of falls?: (Patient-Rptd) Yes Life alert?: (Patient-Rptd) No Use of a cane, walker or w/c?: (Patient-Rptd) Yes Grab bars in the bathroom?: (Patient-Rptd) No Shower chair or bench in shower?: (Patient-Rptd) Yes Elevated toilet seat or a handicapped toilet?: (Patient-Rptd) No  TIMED UP AND  GO:  Was the test performed?  No  Cognitive Function: Impaired: Patient has current diagnosis of cognitive impairment.    07/04/2022    3:02 PM  MMSE - Mini Mental State Exam  Orientation to time 0  Orientation to Place 2  Registration 3  Attention/ Calculation 0  Recall 1  Language- name 2 objects 2  Language- repeat 0  Language- follow 3 step command 3  Language- read & follow direction 1  Write a sentence 0  Copy design 0  Total score 12        10/10/2022    2:18 PM 08/22/2021    3:12 PM  6CIT Screen  What Year? 4 points 4 points  What month? 3 points 3 points  What time? 3 points 0 points  Count back from 20 0 points 0 points  Months in reverse 2 points 2 points  Repeat phrase 6 points 10 points  Total Score 18 points 19 points    Immunizations Immunization History  Administered Date(s) Administered   PFIZER Comirnaty(Gray Top)Covid-19 Tri-Sucrose Vaccine 11/13/2019, 12/04/2019   PFIZER(Purple Top)SARS-COV-2 Vaccination 12/04/2019   Tdap 08/26/2011    Screening Tests Health Maintenance  Topic Date Due   Pneumococcal Vaccine: 50+ Years (1 of 2 - PCV) Never done   DTaP/Tdap/Td (2 - Td or Tdap) 12/15/2023 (Originally 08/25/2021)   COVID-19 Vaccine (4 - 2024-25 season) 12/22/2023 (Originally 12/15/2022)   INFLUENZA VACCINE  11/14/2023   Medicare Annual Wellness (AWV)  10/15/2024   DEXA SCAN  Completed   Hepatitis B Vaccines  Aged Out   HPV VACCINES  Aged Out   Meningococcal B Vaccine  Aged Out   Zoster Vaccines- Shingrix  Discontinued    Health Maintenance  Health Maintenance Due  Topic Date Due   Pneumococcal Vaccine: 50+ Years (1 of 2 - PCV) Never done    Additional Screening:  Vision Screening: Recommended annual ophthalmology exams for early detection of glaucoma and other disorders of the eye. Would you like a referral to an eye doctor? No    Dental Screening: Recommended annual dental exams for proper oral hygiene  Community Resource Referral  / Chronic Care Management: CRR required this visit?  No   CCM required this visit?  No   Plan:    I have personally reviewed and noted the following in the patient's chart:   Medical and social history Use of alcohol, tobacco or illicit drugs  Current medications and supplements including opioid prescriptions. Patient is not currently taking opioid prescriptions. Functional ability and status Nutritional status Physical activity Advanced directives List of other physicians Hospitalizations, surgeries, and ER visits in previous 12 months Vitals Screenings to include cognitive, depression, and falls Referrals and appointments  In addition, I have reviewed and discussed with patient certain preventive protocols, quality metrics, and best practice recommendations. A written personalized care plan for preventive services as well as general preventive health recommendations  were provided to patient.   Lavelle Pfeiffer [place], [place]   05/21/7972   After Visit Summary: (MyChart) Due to this being a telephonic visit, the after visit summary with patients personalized plan was offered to patient via MyChart   Notes: See telephone note

## 2023-10-16 NOTE — Patient Instructions (Signed)
 Ms. Egle , Thank you for taking time out of your busy schedule to complete your Annual Wellness Visit with me. I enjoyed our conversation and look forward to speaking with you again next year. I, as well as your care team,  appreciate your ongoing commitment to your health goals. Please review the following plan we discussed and let me know if I can assist you in the future. Your Game plan/ To Do List     Follow up Visits: Next Medicare AWV with our clinical staff: In 1 year    Have you seen your provider in the last 6 months (3 months if uncontrolled diabetes)? Yes Next Office Visit with your provider: 12/17/23 @ 2:40  Clinician Recommendations:  Aim for 30 minutes of exercise or brisk walking, 6-8 glasses of water, and 5 servings of fruits and vegetables each day.       This is a list of the screening recommended for you and due dates:  Health Maintenance  Topic Date Due   Pneumococcal Vaccine for age over 26 (1 of 2 - PCV) Never done   DTaP/Tdap/Td vaccine (2 - Td or Tdap) 12/15/2023*   COVID-19 Vaccine (4 - 2024-25 season) 12/22/2023*   Flu Shot  11/14/2023   Medicare Annual Wellness Visit  10/15/2024   DEXA scan (bone density measurement)  Completed   Hepatitis B Vaccine  Aged Out   HPV Vaccine  Aged Out   Meningitis B Vaccine  Aged Out   Zoster (Shingles) Vaccine  Discontinued  *Topic was postponed. The date shown is not the original due date.    Advanced directives: (ACP Link)Information on Advanced Care Planning can be found at [place]  Secretary of [facility] Advance Health Care Directives Advance Health Care Directives. [email or web address]   Advance Care Planning is important because it:  [x]  Makes sure you receive the medical care that is consistent with your values, goals, and preferences  [x]  It provides guidance to your family and loved ones and reduces their decisional burden about whether or not they are making the right decisions based on your wishes.  Follow the link  provided in your after visit summary or read over the paperwork we have mailed to you to help you started getting your Advance Directives in place. If you need assistance in completing these, please reach out to us  so that we can help you!  See attachments for Preventive Care and Fall Prevention Tips.

## 2023-10-16 NOTE — Telephone Encounter (Signed)
 Patient seen for AWV and granddaughter is asking if referral can be sent in for patient to start receiving personal care services.  Patient does have Medicaid.

## 2023-10-20 NOTE — Telephone Encounter (Signed)
 I spoke with [facility]. She was notified that Maegen will need an appointment. Dr. Sherre recommend that you bring any paperwork you have and to come in this week for an AWV. This can be scheduled with any of our other provider so these can be complete correctly.   Odella said that she lives in [place]. She will call her mother, and her mother will call the office back to make an appointment.

## 2023-10-20 NOTE — Telephone Encounter (Signed)
 Copied from CRM [id number]. Topic: General - Other >> Oct 20, 2023  3:17 PM Zebedee SAUNDERS wrote: Reason for CRM: Pt's daughter Gladis Lima [phone] called stated Dr. Stacy wants to see pt for referral this week 7/7-02/2024.Please call daughter to scheduled for this week.

## 2023-10-20 NOTE — Telephone Encounter (Signed)
 Appointment has been scheduled with Harrie, FNP

## 2023-10-22 ENCOUNTER — Ambulatory Visit (INDEPENDENT_AMBULATORY_CARE_PROVIDER_SITE_OTHER): Admitting: Family Medicine

## 2023-10-22 ENCOUNTER — Encounter: Payer: Self-pay | Admitting: Family Medicine

## 2023-10-22 VITALS — BP 138/70 | HR 60 | Temp 98.7°F | Resp 16 | Ht 61.5 in | Wt 90.2 lb

## 2023-10-22 DIAGNOSIS — G309 Alzheimer's disease, unspecified: Secondary | ICD-10-CM | POA: Diagnosis not present

## 2023-10-22 DIAGNOSIS — F0280 Dementia in other diseases classified elsewhere without behavioral disturbance: Secondary | ICD-10-CM

## 2023-10-22 NOTE — Assessment & Plan Note (Signed)
 Prefers no medicines.  Dementia requires assistance with daily activities. Her daughter, as power of attorney, is involved in her care. Encouragement needed for fluid intake to address potential hydration issues. - Submit referral for home health aid services. - Encourage fluid intake.

## 2023-10-22 NOTE — Progress Notes (Signed)
 Subjective:  Patient ID: [name], female    DOB: 1926/02/13  Age: 89 y.o. MRN: [id number]  Chief Complaint  Patient presents with   Medical Management of Chronic Issues    Patient request referral for home health aide    Discussed the use of AI scribe software for clinical note transcription with the patient, who gave verbal consent to proceed.  History of Present Illness   The patient, with dementia, presents for a referral for home health aid services due to physical assistance needs. She is accompanied by her daughter, who is her primary caregiver.  Cognitive impairment and functional status - Dementia with significant impairment in activities of daily living - Requires physical assistance with daily activities - Primary caregiver is her daughter, present seven days a week - Needs encouragement to maintain adequate oral hydration with water and Body Armor - No changes in diet or activity levels - Shortness of breath with ambulation - Denies chest pain       10/16/2023    2:31 PM 06/16/2023    2:52 PM 02/10/2023    1:59 PM 10/10/2022    2:15 PM 07/04/2022    2:40 PM  Depression screen PHQ 2/9  Decreased Interest 0 0 0 1 0  Down, Depressed, Hopeless 0 0 0 1 0  PHQ - 2 Score 0 0 0 2 0  Altered sleeping  0  0   Tired, decreased energy  0  0   Change in appetite  0  0   Feeling bad or failure about yourself   0  0   Trouble concentrating  0  0   Moving slowly or fidgety/restless  0  0   Suicidal thoughts  0  0   PHQ-9 Score  0  2   Difficult doing work/chores  Not difficult at all  Somewhat difficult         10/15/2023    9:05 AM  Fall Risk   Falls in the past year? 0  Number falls in past yr: 0  Injury with Fall? 0  Risk for fall due to : Impaired balance/gait;Impaired mobility;History of fall(s)  Follow up Falls evaluation completed;Education provided;Falls prevention discussed    Patient Care Team: Sherre Clapper, MD as PCP - General (Family  Medicine) Lavona Agent, MD as PCP - Cardiology (Cardiology) Nyle Rankin [facility], [facility] (Inactive) (Pharmacist) Dennise Hoes, MD as Consulting Physician (Nephrology) Faythe Purchase, MD as Consulting Physician (Endocrinology)   Review of Systems  Constitutional:  Negative for chills, diaphoresis, fatigue and fever.  HENT:  Negative for congestion, ear pain and sinus pain.   Eyes: Negative.   Respiratory:  Negative for cough and shortness of breath.   Cardiovascular:  Negative for chest pain.  Gastrointestinal:  Negative for abdominal pain, constipation, nausea and vomiting.  Endocrine: Negative.   Genitourinary:  Negative for dysuria.  Musculoskeletal:  Negative for arthralgias.  Skin: Negative.   Allergic/Immunologic: Negative.   Neurological:  Negative for weakness and headaches.  Hematological: Negative.   Psychiatric/Behavioral:  Negative for dysphoric mood. The patient is not nervous/anxious.     Current Outpatient Medications on File Prior to Visit  Medication Sig Dispense Refill   amLODipine  (NORVASC ) 5 MG tablet TAKE 1 TABLET (5 MG TOTAL) BY MOUTH DAILY. 90 tablet 1   Ascorbic Acid (VITAMIN C) 100 MG tablet Take by mouth daily.     aspirin  EC 81 MG tablet Take 1 tablet (81 mg total) by mouth daily. Swallow whole.  90 tablet 3   b complex vitamins tablet Take 1 tablet by mouth daily.     BRILINTA  90 MG TABS tablet TAKE 1 TABLET BY MOUTH TWICE A DAY 180 tablet 2   cinacalcet (SENSIPAR) 30 MG tablet Take 30 mg by mouth 2 (two) times daily.     Coenzyme Q10 (COQ10) 50 MG CAPS Take 50 mg by mouth daily.     dorzolamide -timolol  (COSOPT ) 2-0.5 % ophthalmic solution Place 2 drops into the left eye at bedtime. 10 mL 0   ELDERBERRY PO Take by mouth daily.     famotidine  (PEPCID ) 40 MG tablet Take 1 tablet (40 mg total) by mouth daily. 90 tablet 2   MAGNESIUM GLUCONATE PO Take by mouth daily.     metoprolol  succinate (TOPROL -XL) 25 MG 24 hr tablet TAKE 3 TABLETS BY MOUTH DAILY 270 tablet  0   mirtazapine  (REMERON ) 7.5 MG tablet Take 1 tablet (7.5 mg total) by mouth at bedtime. 90 tablet 2   nitroGLYCERIN  (NITROSTAT ) 0.4 MG SL tablet Place 1 tablet (0.4 mg total) under the tongue every 5 (five) minutes as needed. For chest pain 25 tablet 2   traMADol  (ULTRAM ) 50 MG tablet Take 2 tablets (100 mg total) by mouth daily. 30 tablet 1   triamcinolone  cream (KENALOG ) 0.1 % APPLY 1 APPLICATION TOPICALLY 2 (TWO) TIMES DAILY. BOTH HANDS 30 g 1   [DISCONTINUED] simvastatin  (ZOCOR ) 40 MG tablet Take 1 tablet (40 mg total) by mouth at bedtime. 30 tablet 0   No current facility-administered medications on file prior to visit.   Past Medical History:  Diagnosis Date   Abscess 2003   I&D SCM abscess   Acute ischemic colitis (HCC) 04/10/2011   acute episode of ischemic colitis, with abd pain, CT evidence of transverse colitis, and blood streaked BM.  Resolved with conservative treatment.   Allergy    Anxiety    From dementia   Arthritis    hands, joints, knees (07/19/2016)   Bacteremia 2003   hx of staph bacteremia   CAD (coronary artery disease) 2003, 2010   s/p stenting of RCA x2, s/p stent to OM;  LHC 04/2009 in the setting of a NSTEMI: EF 65%, RCA stent patent with 60% ISR proximal and 40% ISR mid, PLV proximal 50-60% and distal 50%, ostial circumflex 30-40%, proximal OM mild in-stent restenosis with a branch with 90+ percent ostial stenosis, proximal LAD 30-40%.  FFR of ostial circumflex:  0.96 - not flow limiting.  Medical therapy was continued.   Cataract    Chronic kidney disease    Chronic lower back pain    not a big problem (07/19/2016)   Closed displaced fracture of distal phalanx of left middle finger 05/23/2017   Degenerative disc disease    Finger infection    GERD (gastroesophageal reflux disease)    Glaucoma    History of medication noncompliance    concerning Plavix    HTN (hypertension)    Echocardiogram 08/26/11: Mild focal basal septal hypertrophy, EF 55-60%, grade  2 diastolic dysfunction, mild MR.  There was a fluid-filled area likely in the location of the liver noted   Hyperlipidemia    Hyperparathyroidism 2003   parathyroid  adenoma localized to the right inferior thyroid  lobe region   Hypothyroidism    Left renal artery stenosis (HCC)    40%   Lumbar stenosis with neurogenic claudication 2006   s/p decompression L2-L5   Myocardial infarction [facility]) ~ 2013; 05/06/2016   Osteoporosis 2009  diagnosed by bone density scan in 2009, on Vit D only, no bisphosphonate therapy   Phlebitis    LLE   PVD (peripheral vascular disease) (HCC)    s/p left fem-pop bypass graft   Stroke [facility])    In the past   TIA (transient ischemic attack)    carotid dopplers 5/13: no ICA stenosis   Past Surgical History:  Procedure Laterality Date   AMPUTATION FINGER Left 05/24/2017   Procedure: AMPUTATION tip of long FINGER;  Surgeon: Lorretta Dess, MD;  Location: [facility] OR;  Service: Plastics;  Laterality: Left;   CARDIAC CATHETERIZATION N/A 05/06/2016   Procedure: Left Heart Cath and Coronary Angiography;  Surgeon: Dorn JINNY Lesches, MD;  Location: [facility] INVASIVE CV LAB;  Service: Cardiovascular;  Laterality: N/A;   CARDIAC CATHETERIZATION N/A 05/06/2016   Procedure: Coronary Stent Intervention;  Surgeon: Dorn JINNY Lesches, MD;  Location: [facility] INVASIVE CV LAB;  Service: Cardiovascular;  Laterality: N/A;  RCA   CATARACT EXTRACTION, BILATERAL Bilateral    CORONARY ANGIOPLASTY WITH STENT PLACEMENT  09/2008   CORONARY ANGIOPLASTY WITH STENT PLACEMENT  04/2001   CORONARY ARTERY BYPASS GRAFT     ENDOSCOPIC HEMILAMINOTOMY W/ DISCECTOMY LUMBAR  06/2004   L3, L4 lami; L2, L5 hemilami; decompresion L2-3, 3-4, 4-   EYE SURGERY     For cataracts and glaucoma   FEMORAL BYPASS  before 2003   LLE   I & D EXTREMITY Left 05/24/2017   Procedure: IRRIGATION AND DEBRIDEMENT LEFT LONG FINGER;  Surgeon: Lorretta Dess, MD;  Location: [facility] OR;  Service: Plastics;  Laterality: Left;   INCISION AND  DRAINAGE ANTERIOR NECK  10/2001   absces sternoclavicular joint    Family History  Problem Relation Age of Onset   Stroke Mother    Heart disease Mother    Stroke Father    Breast cancer Daughter    Varicose Veins Daughter    Social History   Socioeconomic History   Marital status: Widowed    Spouse name: Not on file   Number of children: Not on file   Years of education: Not on file   Highest education level: 12th grade  Occupational History   Not on file  Tobacco Use   Smoking status: Never   Smokeless tobacco: Never  Vaping Use   Vaping status: Never Used  Substance and Sexual Activity   Alcohol use: No   Drug use: Never   Sexual activity: Not Currently    Birth control/protection: None  Other Topics Concern   Not on file  Social History Narrative   Lives with daughter and granddaughter   Social Drivers of Health   Financial Resource Strain: Medium Risk (10/15/2023)   Overall Financial Resource Strain (CARDIA)    Difficulty of Paying Living Expenses: Somewhat hard  Food Insecurity: Unknown (10/15/2023)   Hunger Vital Sign    Worried About Running Out of Food in the Last Year: Patient declined    Ran Out of Food in the Last Year: Never true  Transportation Needs: No Transportation Needs (10/15/2023)   PRAPARE - Administrator, Civil Service (Medical): No    Lack of Transportation (Non-Medical): No  Physical Activity: Inactive (10/15/2023)   Exercise Vital Sign    Days of Exercise per Week: 0 days    Minutes of Exercise per Session: 0 min  Stress: No Stress Concern Present (10/15/2023)   [employer] of Occupational Health - Occupational Stress Questionnaire    Feeling of Stress: Only a  little  Social Connections: Moderately Isolated (10/15/2023)   Social Connection and Isolation Panel    Frequency of Communication with Friends and Family: More than three times a week    Frequency of Social Gatherings with Friends and Family: Once a week    Attends  Religious Services: 1 to 4 times per year    Active Member of [employer] or Organizations: No    Attends Banker Meetings: Never    Marital Status: Widowed    Objective:  BP 138/70   Pulse 60   Temp 98.7 F (37.1 C) (Temporal)   Resp 16   Ht 5' 1.5 (1.562 m)   Wt 90 lb 3.2 oz (40.9 kg)   BMI 16.77 kg/m      10/22/2023    2:41 PM 10/16/2023    1:49 PM 07/15/2023    4:29 PM  BP/Weight  Systolic BP 138 --   Diastolic BP 70 --   Wt. (Lbs) 90.2 91 91  BMI 16.77 kg/m2 16.92 kg/m2 16.92 kg/m2    Physical Exam Constitutional:      General: She is not in acute distress.    Appearance: Normal appearance.  Cardiovascular:     Rate and Rhythm: Normal rate and regular rhythm.     Heart sounds: Normal heart sounds. No murmur heard. Pulmonary:     Effort: Pulmonary effort is normal.     Breath sounds: Normal breath sounds. No wheezing.  Abdominal:     General: Bowel sounds are normal.     Palpations: Abdomen is soft.     Tenderness: There is no abdominal tenderness.  Neurological:     Mental Status: She is alert. Mental status is at baseline.  Psychiatric:        Mood and Affect: Mood normal.        Behavior: Behavior normal.    Lab Results  Component Value Date   WBC 6.7 02/15/2023   HGB 10.7 (L) 02/15/2023   HCT 34.1 (L) 02/15/2023   PLT 162 02/15/2023   GLUCOSE 98 02/15/2023   CHOL 313 (H) 02/10/2023   TRIG 156 (H) 02/10/2023   HDL 71 02/10/2023   LDLCALC 213 (H) 02/10/2023   ALT 14 02/15/2023   AST 21 02/15/2023   NA 138 02/15/2023   K 4.3 02/15/2023   CL 107 02/15/2023   CREATININE 2.14 (H) 02/15/2023   BUN 36 (H) 02/15/2023   CO2 21 (L) 02/15/2023   TSH 0.653 07/04/2022   INR 1.09 06/25/2016   HGBA1C 5.9 (H) 08/26/2011      Assessment & Plan:  Alzheimer disease (HCC) Assessment & Plan: Prefers no medicines.  Dementia requires assistance with daily activities. Her daughter, as power of attorney, is involved in her care. Encouragement needed for  fluid intake to address potential hydration issues. - Submit referral for home health aid services. - Encourage fluid intake.  Orders: -     Ambulatory referral to Home Health     No orders of the defined types were placed in this encounter.   Orders Placed This Encounter  Procedures   Ambulatory referral to Home Health         Follow-up: Return if symptoms worsen or fail to improve, for chronic appointment with Dr. Sherre in 12/2023.   I,Angela Taylor,acting as a scribe for Harrie CHRISTELLA Cedar, FNP.,have documented all relevant documentation on the behalf of Harrie CHRISTELLA Cedar, FNP,as directed by  Harrie CHRISTELLA Cedar, FNP while in the presence of Harrie CHRISTELLA Cedar,  FNP.   An After Visit Summary was printed and given to the patient.  I attest that I have reviewed this visit and agree with the plan scribed by my staff.   Harrie CHRISTELLA Cedar, FNP [name] [phone]

## 2023-10-23 NOTE — Addendum Note (Signed)
 Addended by: FOREST BOWLING A on: 10/23/2023 01:59 PM   Modules accepted: Orders

## 2023-10-28 ENCOUNTER — Ambulatory Visit (INDEPENDENT_AMBULATORY_CARE_PROVIDER_SITE_OTHER): Admitting: Podiatry

## 2023-10-28 ENCOUNTER — Encounter: Payer: Self-pay | Admitting: Podiatry

## 2023-10-28 DIAGNOSIS — I739 Peripheral vascular disease, unspecified: Secondary | ICD-10-CM | POA: Diagnosis not present

## 2023-10-28 DIAGNOSIS — M79674 Pain in right toe(s): Secondary | ICD-10-CM | POA: Diagnosis not present

## 2023-10-28 DIAGNOSIS — B351 Tinea unguium: Secondary | ICD-10-CM

## 2023-10-28 DIAGNOSIS — M79675 Pain in left toe(s): Secondary | ICD-10-CM | POA: Diagnosis not present

## 2023-11-02 ENCOUNTER — Encounter: Payer: Self-pay | Admitting: Podiatry

## 2023-11-02 NOTE — Progress Notes (Signed)
  Subjective:  Patient ID: [name], female    DOB: 11-19-1925,  MRN: [id number]  [name] presents to clinic today for at risk foot care. Patient has h/o PAD and painful thick toenails that are difficult to trim. Pain interferes with ambulation. Aggravating factors include wearing enclosed shoe gear. Pain is relieved with periodic professional debridement.  Chief Complaint  Patient presents with   RFC    RFC Non diabetic toenail trim. LOV with PCP 10/2023.   New problem(s): None.   PCP is [name], Abigail, MD.  Allergies  Allergen Reactions   Aricept  [Donepezil ]     Nausea   Diphenhydramine Hcl Other (See Comments)    unknown   Penicillins Other (See Comments)    unknown   Shellfish Allergy Other (See Comments)    unknown   Statins     Myalgia.    Review of Systems: Negative except as noted in the HPI.  Objective: No changes noted in today's physical examination. There were no vitals filed for this visit. [name] is a pleasant 89 y.o. female WD, WN in NAD. AAO x 3.  Vascular Examination: CFT <3 seconds b/l. DP pulses faintly palpable b/l. PT pulses nonpalpable b/l. Digital hair absent. Skin temperature gradient warm to warm b/l. No pain with calf compression. No ischemia or gangrene. No cyanosis or clubbing noted b/l. No edema noted b/l LE.   Neurological Examination: Sensation grossly intact b/l with 10 gram monofilament.   Dermatological Examination: Pedal skin warm and supple b/l. No open wounds b/l. No interdigital macerations. Toenails 1-5 b/l thick, discolored, elongated with subungual debris and pain on dorsal palpation.  No corns, calluses nor porokeratotic lesions noted.  Musculoskeletal Examination: Muscle strength 5/5 to all lower extremity muscle groups bilaterally. HAV with bunion deformity noted b/l LE. Utilizes wheelchair for mobility assistance.  Radiographs: None  Assessment/Plan: 1. Pain due to onychomycosis of toenails of both feet    2. Peripheral vascular disease (HCC)    Patient was evaluated and treated. All patient's and/or POA's questions/concerns addressed on today's visit. Toenails 1-5 debrided in length and girth without incident. Treatment was provided by assistant Andrez Manchester under my supervision. Continue soft, supportive shoe gear daily. Report any pedal injuries to medical professional. Call office if there are any questions/concerns. Return in about 3 months (around 01/28/2024).  Delon LITTIE Merlin, DPM      [place] LOCATION: [address], [place] [address]                   Office [phone]   [facility] LOCATION: [address] [place], [place] [address] Office [phone]

## 2023-11-25 ENCOUNTER — Other Ambulatory Visit: Payer: Self-pay | Admitting: Family Medicine

## 2023-11-25 LAB — LAB REPORT - SCANNED: EGFR: 25

## 2023-12-17 ENCOUNTER — Ambulatory Visit (INDEPENDENT_AMBULATORY_CARE_PROVIDER_SITE_OTHER): Admitting: Family Medicine

## 2023-12-17 VITALS — BP 134/68 | HR 60 | Temp 98.0°F | Ht 61.5 in | Wt 91.0 lb

## 2023-12-17 DIAGNOSIS — G894 Chronic pain syndrome: Secondary | ICD-10-CM

## 2023-12-17 DIAGNOSIS — F0280 Dementia in other diseases classified elsewhere without behavioral disturbance: Secondary | ICD-10-CM

## 2023-12-17 DIAGNOSIS — N1831 Chronic kidney disease, stage 3a: Secondary | ICD-10-CM

## 2023-12-17 DIAGNOSIS — R7303 Prediabetes: Secondary | ICD-10-CM | POA: Diagnosis not present

## 2023-12-17 DIAGNOSIS — G309 Alzheimer's disease, unspecified: Secondary | ICD-10-CM | POA: Diagnosis not present

## 2023-12-17 DIAGNOSIS — F5101 Primary insomnia: Secondary | ICD-10-CM

## 2023-12-17 DIAGNOSIS — I110 Hypertensive heart disease with heart failure: Secondary | ICD-10-CM | POA: Diagnosis not present

## 2023-12-17 DIAGNOSIS — K219 Gastro-esophageal reflux disease without esophagitis: Secondary | ICD-10-CM

## 2023-12-17 DIAGNOSIS — I2510 Atherosclerotic heart disease of native coronary artery without angina pectoris: Secondary | ICD-10-CM

## 2023-12-17 DIAGNOSIS — E211 Secondary hyperparathyroidism, not elsewhere classified: Secondary | ICD-10-CM

## 2023-12-17 MED ORDER — TRIAMCINOLONE ACETONIDE 0.1 % EX CREA: 1.0000 | TOPICAL_CREAM | Freq: Two times a day (BID) | CUTANEOUS | 1 refills | Status: DC

## 2023-12-17 NOTE — Progress Notes (Unsigned)
 Subjective:  Patient ID: [name], female    DOB: 29-Nov-1925  Age: 89 y.o. MRN: [id number]  Chief Complaint  Patient presents with  . Medical Management of Chronic Issues    HPI: Discussed the use of AI scribe software for clinical note transcription with the patient, who gave verbal consent to proceed.  History of Present Illness [name] is a 89 year old female with hypertension and coronary artery disease who presents for medication review and follow-up. She is accompanied by her daughter.  Musculoskeletal pain - Recent episode of backache while folding clothes - Pain relieved with tramadol  - Tramadol  use is infrequent  Cardiovascular symptoms - No chest pain - No need for nitroglycerin  recently  Respiratory symptoms - No breathing problems  Gastrointestinal symptoms - No abdominal pain - No bowel problems  Genitourinary symptoms - No bladder issues - Maintains good bladder control  Constitutional symptoms - No fever, chills, or sweats  Otolaryngologic symptoms - No earaches, sore throat, or stuffy nose  Medication review - Current medications:   Hypertension: amlodipine  5 mg daily, metoprolol  25 mg (two in the morning and one at night.) GERD:  famotidine  for acid reflux, mirtazapine  for sleep CORONARY ARTERY DISEASE: Brilinta  90 mg twice daily, baby aspirin  81 mg daily Hyperparathyroidism: Sensipar.  Nutritional status - Eats three meals a day  Immunization status - Uncertain if received flu shot last year - Does not receive annual influenza vaccination       10/16/2023    2:31 PM 06/16/2023    2:52 PM 02/10/2023    1:59 PM 10/10/2022    2:15 PM 07/04/2022    2:40 PM  Depression screen PHQ 2/9  Decreased Interest 0 0 0 1 0  Down, Depressed, Hopeless 0 0 0 1 0  PHQ - 2 Score 0 0 0 2 0  Altered sleeping  0  0   Tired, decreased energy  0  0   Change in appetite  0  0   Feeling bad or failure about yourself   0  0   Trouble  concentrating  0  0   Moving slowly or fidgety/restless  0  0   Suicidal thoughts  0  0   PHQ-9 Score  0  2   Difficult doing work/chores  Not difficult at all  Somewhat difficult         10/15/2023    9:05 AM  Fall Risk   Falls in the past year? 0  Number falls in past yr: 0  Injury with Fall? 0  Risk for fall due to : Impaired balance/gait;Impaired mobility;History of fall(s)  Follow up Falls evaluation completed;Education provided;Falls prevention discussed    Patient Care Team: Sherre Clapper, MD as PCP - General (Family Medicine) Lavona Agent, MD as PCP - Cardiology (Cardiology) Nyle Rankin POUR, [facility] (Inactive) (Pharmacist) Dennise Hoes, MD as Consulting Physician (Nephrology) Faythe Purchase, MD as Consulting Physician (Endocrinology)   Review of Systems  Constitutional:  Negative for chills, fatigue and fever.  HENT:  Negative for congestion, ear pain and sore throat.   Respiratory:  Negative for cough and shortness of breath.   Cardiovascular:  Negative for chest pain.  Gastrointestinal:  Negative for abdominal pain, constipation, diarrhea, nausea and vomiting.  Genitourinary:  Negative for dysuria and urgency.  Musculoskeletal:  Positive for back pain. Negative for arthralgias and myalgias.  Skin:  Negative for rash.  Neurological:  Negative for dizziness and headaches.  Psychiatric/Behavioral:  Negative for  dysphoric mood. The patient is not nervous/anxious.     Current Outpatient Medications on File Prior to Visit  Medication Sig Dispense Refill  . amLODipine  (NORVASC ) 5 MG tablet TAKE 1 TABLET (5 MG TOTAL) BY MOUTH DAILY. 90 tablet 1  . Ascorbic Acid (VITAMIN C) 100 MG tablet Take by mouth daily.    . aspirin  EC 81 MG tablet Take 1 tablet (81 mg total) by mouth daily. Swallow whole. 90 tablet 3  . b complex vitamins tablet Take 1 tablet by mouth daily.    . BRILINTA  90 MG TABS tablet TAKE 1 TABLET BY MOUTH TWICE A DAY 180 tablet 2  . cinacalcet (SENSIPAR) 30 MG  tablet Take 30 mg by mouth 2 (two) times daily.    . Coenzyme Q10 (COQ10) 50 MG CAPS Take 50 mg by mouth daily.    . dorzolamide -timolol  (COSOPT ) 2-0.5 % ophthalmic solution Place 2 drops into the left eye at bedtime. 10 mL 0  . ELDERBERRY PO Take by mouth daily.    . famotidine  (PEPCID ) 40 MG tablet Take 1 tablet (40 mg total) by mouth daily. 90 tablet 2  . MAGNESIUM GLUCONATE PO Take by mouth daily.    . metoprolol  succinate (TOPROL -XL) 25 MG 24 hr tablet TAKE 3 TABLETS BY MOUTH DAILY 270 tablet 0  . mirtazapine  (REMERON ) 7.5 MG tablet Take 1 tablet (7.5 mg total) by mouth at bedtime. 90 tablet 2  . nitroGLYCERIN  (NITROSTAT ) 0.4 MG SL tablet Place 1 tablet (0.4 mg total) under the tongue every 5 (five) minutes as needed. For chest pain 25 tablet 2  . traMADol  (ULTRAM ) 50 MG tablet Take 2 tablets (100 mg total) by mouth daily. 30 tablet 1  . [DISCONTINUED] simvastatin  (ZOCOR ) 40 MG tablet Take 1 tablet (40 mg total) by mouth at bedtime. 30 tablet 0   No current facility-administered medications on file prior to visit.   Past Medical History:  Diagnosis Date  . Abscess 2003   I&D SCM abscess  . Acute ischemic colitis (HCC) 04/10/2011   acute episode of ischemic colitis, with abd pain, CT evidence of transverse colitis, and blood streaked BM.  Resolved with conservative treatment.  . Allergy   . Anxiety    From dementia  . Arthritis    hands, joints, knees (07/19/2016)  . Bacteremia 2003   hx of staph bacteremia  . CAD (coronary artery disease) 2003, 2010   s/p stenting of RCA x2, s/p stent to OM;  LHC 04/2009 in the setting of a NSTEMI: EF 65%, RCA stent patent with 60% ISR proximal and 40% ISR mid, PLV proximal 50-60% and distal 50%, ostial circumflex 30-40%, proximal OM mild in-stent restenosis with a branch with 90+ percent ostial stenosis, proximal LAD 30-40%.  FFR of ostial circumflex:  0.96 - not flow limiting.  Medical therapy was continued.  . Cataract   . Chronic kidney disease    . Chronic lower back pain    not a big problem (07/19/2016)  . Closed displaced fracture of distal phalanx of left middle finger 05/23/2017  . Degenerative disc disease   . Finger infection   . GERD (gastroesophageal reflux disease)   . Glaucoma   . History of medication noncompliance    concerning Plavix   . HTN (hypertension)    Echocardiogram 08/26/11: Mild focal basal septal hypertrophy, EF 55-60%, grade 2 diastolic dysfunction, mild MR.  There was a fluid-filled area likely in the location of the liver noted  . Hyperlipidemia   .  Hyperparathyroidism 2003   parathyroid  adenoma localized to the right inferior thyroid  lobe region  . Hypothyroidism   . Left renal artery stenosis (HCC)    40%  . Lumbar stenosis with neurogenic claudication 2006   s/p decompression L2-L5  . Myocardial infarction [facility]) ~ 2013; 05/06/2016  . Osteoporosis 2009   diagnosed by bone density scan in 2009, on Vit D only, no bisphosphonate therapy  . Phlebitis    LLE  . PVD (peripheral vascular disease) (HCC)    s/p left fem-pop bypass graft  . Stroke [facility])    In the past  . TIA (transient ischemic attack)    carotid dopplers 5/13: no ICA stenosis   Past Surgical History:  Procedure Laterality Date  . AMPUTATION FINGER Left 05/24/2017   Procedure: AMPUTATION tip of long FINGER;  Surgeon: Lorretta Dess, MD;  Location: [facility] OR;  Service: Plastics;  Laterality: Left;  . CARDIAC CATHETERIZATION N/A 05/06/2016   Procedure: Left Heart Cath and Coronary Angiography;  Surgeon: Dorn JINNY Lesches, MD;  Location: [facility] INVASIVE CV LAB;  Service: Cardiovascular;  Laterality: N/A;  . CARDIAC CATHETERIZATION N/A 05/06/2016   Procedure: Coronary Stent Intervention;  Surgeon: Dorn JINNY Lesches, MD;  Location: [facility] INVASIVE CV LAB;  Service: Cardiovascular;  Laterality: N/A;  RCA  . CATARACT EXTRACTION, BILATERAL Bilateral   . CORONARY ANGIOPLASTY WITH STENT PLACEMENT  09/2008  . CORONARY ANGIOPLASTY WITH STENT PLACEMENT   04/2001  . CORONARY ARTERY BYPASS GRAFT    . ENDOSCOPIC HEMILAMINOTOMY W/ DISCECTOMY LUMBAR  06/2004   L3, L4 lami; L2, L5 hemilami; decompresion L2-3, 3-4, 4-  . EYE SURGERY     For cataracts and glaucoma  . FEMORAL BYPASS  before 2003   LLE  . I & D EXTREMITY Left 05/24/2017   Procedure: IRRIGATION AND DEBRIDEMENT LEFT LONG FINGER;  Surgeon: Lorretta Dess, MD;  Location: [facility] OR;  Service: Plastics;  Laterality: Left;  . INCISION AND DRAINAGE ANTERIOR NECK  10/2001   absces sternoclavicular joint    Family History  Problem Relation Age of Onset  . Stroke Mother   . Heart disease Mother   . Stroke Father   . Breast cancer Daughter   . Varicose Veins Daughter    Social History   Socioeconomic History  . Marital status: Widowed    Spouse name: Not on file  . Number of children: Not on file  . Years of education: Not on file  . Highest education level: 12th grade  Occupational History  . Not on file  Tobacco Use  . Smoking status: Never  . Smokeless tobacco: Never  Vaping Use  . Vaping status: Never Used  Substance and Sexual Activity  . Alcohol use: No  . Drug use: Never  . Sexual activity: Not Currently    Birth control/protection: None  Other Topics Concern  . Not on file  Social History Narrative   Lives with daughter and granddaughter   Social Drivers of Health   Financial Resource Strain: Medium Risk (10/15/2023)   Overall Financial Resource Strain (CARDIA)   . Difficulty of Paying Living Expenses: Somewhat hard  Food Insecurity: No Food Insecurity (12/17/2023)   Hunger Vital Sign   . Worried About Programme researcher, broadcasting/film/video in the Last Year: Never true   . Ran Out of Food in the Last Year: Never true  Transportation Needs: No Transportation Needs (10/15/2023)   PRAPARE - Transportation   . Lack of Transportation (Medical): No   . Lack of Transportation (  Non-Medical): No  Physical Activity: Inactive (10/15/2023)   Exercise Vital Sign   . Days of Exercise per Week: 0  days   . Minutes of Exercise per Session: 0 min  Stress: No Stress Concern Present (10/15/2023)   [employer] of Occupational Health - Occupational Stress Questionnaire   . Feeling of Stress: Only a little  Social Connections: Moderately Isolated (10/15/2023)   Social Connection and Isolation Panel   . Frequency of Communication with Friends and Family: More than three times a week   . Frequency of Social Gatherings with Friends and Family: Once a week   . Attends Religious Services: 1 to 4 times per year   . Active Member of Clubs or Organizations: No   . Attends Banker Meetings: Never   . Marital Status: Widowed    Objective:  BP 134/68   Pulse 60   Temp 98 F (36.7 C)   Ht 5' 1.5 (1.562 m)   Wt 91 lb (41.3 kg)   SpO2 99%   BMI 16.92 kg/m      12/17/2023    2:46 PM 10/22/2023    2:41 PM 10/16/2023    1:49 PM  BP/Weight  Systolic BP 134 138 --  Diastolic BP 68 70 --  Wt. (Lbs) 91 90.2 91  BMI 16.92 kg/m2 16.77 kg/m2 16.92 kg/m2    Physical Exam Vitals reviewed.  Constitutional:      Appearance: Normal appearance. She is normal weight.  Neck:     Vascular: No carotid bruit.  Cardiovascular:     Rate and Rhythm: Normal rate and regular rhythm.     Heart sounds: Normal heart sounds.  Pulmonary:     Effort: Pulmonary effort is normal. No respiratory distress.     Breath sounds: Normal breath sounds.  Abdominal:     General: Abdomen is flat. Bowel sounds are normal.     Palpations: Abdomen is soft.     Tenderness: There is no abdominal tenderness.  Neurological:     Mental Status: She is alert.  Psychiatric:        Mood and Affect: Mood normal.        Behavior: Behavior normal.     {Perform Simple Foot Exam  Perform Detailed exam:1} {Insert foot Exam (Optional):30965}   Lab Results  Component Value Date   WBC 7.6 12/17/2023   HGB 10.1 (L) 12/17/2023   HCT 32.5 (L) 12/17/2023   PLT 243 12/17/2023   GLUCOSE 72 12/17/2023   CHOL 313 (H)  02/10/2023   TRIG 156 (H) 02/10/2023   HDL 71 02/10/2023   LDLCALC 213 (H) 02/10/2023   ALT 9 12/17/2023   AST 17 12/17/2023   NA 142 12/17/2023   K 5.0 12/17/2023   CL 105 12/17/2023   CREATININE 1.96 (H) 12/17/2023   BUN 35 12/17/2023   CO2 21 12/17/2023   TSH 0.653 07/04/2022   INR 1.09 06/25/2016   HGBA1C 5.6 12/17/2023      Assessment & Plan:  Alzheimer disease [facility]) Assessment & Plan: Prefers no medicines.  Dementia requires assistance with daily activities. Her daughter, as power of attorney, is involved in her care.    Hypertensive heart disease with heart failure [facility]) Assessment & Plan: Hypertension managed with amlodipine  and metoprolol .  Orders: -     CBC with Differential/Platelet -     Comprehensive metabolic panel with GFR  Prediabetes Assessment & Plan: Hemoglobin A1c 5.6%, 3 month avg of blood sugars, is in prediabetic range.  In order to prevent progression to diabetes, recommend low carb diet and regular exercise   Orders: -     Hemoglobin A1c  Chronic kidney disease, stage 3a (HCC) Assessment & Plan: Chronic kidney disease with well-managed kidney function but elevated normal potassium levels. - Recommend a low potassium diet. - Follow-up with nephrologist Dr. Dennise on September 11th.   Secondary hyperparathyroidism, non-renal [facility]) Assessment & Plan: Secondary hyperparathyroidism managed with Sensipar.   Coronary artery disease involving native coronary artery of native heart without angina pectoris Assessment & Plan: Coronary artery disease managed with Brilinta , baby aspirin , and nitroglycerin .   Gastroesophageal reflux disease without esophagitis Assessment & Plan: Gastroesophageal reflux disease managed with famotidine .   Primary insomnia Assessment & Plan: Insomnia managed with mirtazapine , effective in aiding sleep.   Chronic pain syndrome Assessment & Plan: Chronic pain, primarily back pain, managed with tramadol  as  needed.   Other orders -     Triamcinolone  Acetonide; Apply 1 Application topically 2 (two) times daily. Both hands  Dispense: 30 g; Refill: 1        Meds ordered this encounter  Medications  . triamcinolone  cream (KENALOG ) 0.1 %    Sig: Apply 1 Application topically 2 (two) times daily. Both hands    Dispense:  30 g    Refill:  1    Orders Placed This Encounter  Procedures  . CBC with Differential/Platelet  . Comprehensive metabolic panel with GFR  . Hemoglobin A1c     Follow-up: Return in about 6 months (around 06/15/2024) for chronic follow up.   I,Manan Olmo,acting as a Neurosurgeon for Abigail Free, MD.,have documented all relevant documentation on the behalf of Abigail Free, MD,as directed by  Abigail Free, MD while in the presence of Abigail Free, MD.   LILLETTE Kato I Leal-Borjas,acting as a scribe for Abigail Free, MD.,have documented all relevant documentation on the behalf of Abigail Free, MD,as directed by  Abigail Free, MD while in the presence of Abigail Free, MD.    An After Visit Summary was printed and given to the patient.  Abigail Free, MD [facility] [phone]

## 2023-12-18 ENCOUNTER — Ambulatory Visit: Payer: Self-pay | Admitting: Family Medicine

## 2023-12-18 LAB — COMPREHENSIVE METABOLIC PANEL WITH GFR
ALT: 9 IU/L (ref 0–32)
AST: 17 IU/L (ref 0–40)
Albumin: 4.2 g/dL (ref 3.6–4.6)
Alkaline Phosphatase: 167 IU/L — ABNORMAL HIGH (ref 44–121)
BUN/Creatinine Ratio: 18 (ref 12–28)
BUN: 35 mg/dL (ref 10–36)
Bilirubin Total: 0.2 mg/dL (ref 0.0–1.2)
CO2: 21 mmol/L (ref 20–29)
Calcium: 10.2 mg/dL (ref 8.7–10.3)
Chloride: 105 mmol/L (ref 96–106)
Creatinine, Ser: 1.96 mg/dL — ABNORMAL HIGH (ref 0.57–1.00)
Globulin, Total: 2.9 g/dL (ref 1.5–4.5)
Glucose: 72 mg/dL (ref 70–99)
Potassium: 5 mmol/L (ref 3.5–5.2)
Sodium: 142 mmol/L (ref 134–144)
Total Protein: 7.1 g/dL (ref 6.0–8.5)
eGFR: 23 mL/min/1.73 — ABNORMAL LOW (ref >59)

## 2023-12-18 LAB — CBC WITH DIFFERENTIAL/PLATELET
Basophils Absolute: 0.1 x10E3/uL (ref 0.0–0.2)
Basos: 1 %
EOS (ABSOLUTE): 0.4 x10E3/uL (ref 0.0–0.4)
Eos: 5 %
Hematocrit: 32.5 % — ABNORMAL LOW (ref 34.0–46.6)
Hemoglobin: 10.1 g/dL — ABNORMAL LOW (ref 11.1–15.9)
Immature Grans (Abs): 0 x10E3/uL (ref 0.0–0.1)
Immature Granulocytes: 0 %
Lymphocytes Absolute: 2.1 x10E3/uL (ref 0.7–3.1)
Lymphs: 27 %
MCH: 31.2 pg (ref 26.6–33.0)
MCHC: 31.1 g/dL — ABNORMAL LOW (ref 31.5–35.7)
MCV: 100 fL — ABNORMAL HIGH (ref 79–97)
Monocytes Absolute: 0.6 x10E3/uL (ref 0.1–0.9)
Monocytes: 8 %
Neutrophils Absolute: 4.4 x10E3/uL (ref 1.4–7.0)
Neutrophils: 59 %
Platelets: 243 x10E3/uL (ref 150–450)
RBC: 3.24 x10E6/uL — ABNORMAL LOW (ref 3.77–5.28)
RDW: 12.6 % (ref 11.7–15.4)
WBC: 7.6 x10E3/uL (ref 3.4–10.8)

## 2023-12-18 LAB — HEMOGLOBIN A1C
Est. average glucose Bld gHb Est-mCnc: 114 mg/dL
Hgb A1c MFr Bld: 5.6 % (ref 4.8–5.6)

## 2023-12-21 DIAGNOSIS — G894 Chronic pain syndrome: Secondary | ICD-10-CM | POA: Insufficient documentation

## 2023-12-21 DIAGNOSIS — R7303 Prediabetes: Secondary | ICD-10-CM | POA: Insufficient documentation

## 2023-12-21 DIAGNOSIS — N1831 Chronic kidney disease, stage 3a: Secondary | ICD-10-CM | POA: Insufficient documentation

## 2023-12-21 NOTE — Assessment & Plan Note (Signed)
 Hypertension managed with amlodipine  and metoprolol .

## 2023-12-21 NOTE — Assessment & Plan Note (Signed)
 Gastroesophageal reflux disease managed with famotidine .

## 2023-12-21 NOTE — Assessment & Plan Note (Signed)
 Prefers no medicines.  Dementia requires assistance with daily activities. Her daughter, as power of attorney, is involved in her care.

## 2023-12-21 NOTE — Assessment & Plan Note (Signed)
 Chronic pain, primarily back pain, managed with tramadol  as needed.

## 2023-12-21 NOTE — Assessment & Plan Note (Signed)
 Insomnia managed with mirtazapine , effective in aiding sleep.

## 2023-12-21 NOTE — Assessment & Plan Note (Signed)
 Secondary hyperparathyroidism managed with Sensipar.

## 2023-12-21 NOTE — Assessment & Plan Note (Signed)
 Chronic kidney disease with well-managed kidney function but elevated normal potassium levels. - Recommend a low potassium diet. - Follow-up with nephrologist Dr. Dennise on September 11th.

## 2023-12-21 NOTE — Assessment & Plan Note (Signed)
 Coronary artery disease managed with Brilinta , baby aspirin , and nitroglycerin .

## 2023-12-21 NOTE — Assessment & Plan Note (Signed)
Hemoglobin A1c 5.6%, 3 month avg of blood sugars, is in prediabetic range.  In order to prevent progression to diabetes, recommend low carb diet and regular exercise  

## 2023-12-27 ENCOUNTER — Encounter: Payer: Self-pay | Admitting: Family Medicine

## 2024-01-12 ENCOUNTER — Ambulatory Visit: Payer: Self-pay

## 2024-01-12 NOTE — Telephone Encounter (Signed)
 FYI Only or Action Required?: FYI only for provider.  Patient was last seen in primary care on 12/17/2023 by Sherre Clapper, MD.  Called Nurse Triage reporting Cyst on buttocks. Daughter does not think it is a boil.  Symptoms began several days ago.  Interventions attempted: Nothing.  Symptoms are: unchanged. It is no longer red.  Triage Disposition: See Physician Within 24 Hours  Patient/caregiver understands and will follow disposition?: Yes                          Copied from CRM #[id number]. Topic: Clinical - Red Word Triage >> Jan 12, 2024  2:31 PM Fonda T wrote: [facility] that prompted transfer to Nurse Triage: Patient daughter calling, states she noticed a cyst/knot on left buttocks while bathing mother. Tender/painful and sore to the touch.   Patient daughter concerned, and would like to have it evaluated as soon as possible. Reason for Disposition  [1] Swelling is painful to touch AND [2] no fever  Answer Assessment - Initial Assessment Questions 1. APPEARANCE of SWELLING: What does it look like?     little 2. SIZE: How large is the swelling? (e.g., inches, cm; or compare to size of pinhead, tip of pen, eraser, coin, pea, grape, ping pong ball)      Grape sized yesterday - a little smaller today.  About the size of a quarter 3. LOCATION: Where is the swelling located?     Middle of left buttock 4. ONSET: When did the swelling start?     Noticed Sunday - Pt noticed it Saturday 5. COLOR: What color is it? Is there more than one color?     Was red yesterday - not red today 6. PAIN: Is there any pain? If Yes, ask: How bad is the pain? (Scale 1-10; or mild, moderate, severe)       Sore 7. ITCH: Does it itch? If Yes, ask: How bad is the itch?      no 8. CAUSE: What do you think caused the swelling?     Does not think it is a boil 9 OTHER SYMPTOMS: Do you have any other symptoms? (e.g., fever)     no  Protocols used: Skin Lump or  Localized Swelling-A-AH

## 2024-01-12 NOTE — Progress Notes (Signed)
 "  Acute Office Visit  Subjective:    Patient ID: [name], female    DOB: 11/18/1925, 89 y.o.   MRN: [id number]  Chief Complaint  Patient presents with   Abscess     Discussed the use of AI scribe software for clinical note transcription with the patient, who gave verbal consent to proceed.  History of Present Illness [name] is a 89 year old female who presents with a painful abscess.  Localized abscess - Painful area discovered on Sunday - Pain worsens with pressure, such as when rolling onto it - No drainage observed - No prior similar episodes - No prior surgical evaluation for similar issues  Constitutional symptoms - No fever    Past Medical History:  Diagnosis Date   Abscess 2003   I&D SCM abscess   Acute ischemic colitis 04/10/2011   acute episode of ischemic colitis, with abd pain, CT evidence of transverse colitis, and blood streaked BM.  Resolved with conservative treatment.   Allergy    Anxiety    From dementia   Arthritis    hands, joints, knees (07/19/2016)   Bacteremia 2003   hx of staph bacteremia   CAD (coronary artery disease) 2003, 2010   s/p stenting of RCA x2, s/p stent to OM;  LHC 04/2009 in the setting of a NSTEMI: EF 65%, RCA stent patent with 60% ISR proximal and 40% ISR mid, PLV proximal 50-60% and distal 50%, ostial circumflex 30-40%, proximal OM mild in-stent restenosis with a branch with 90+ percent ostial stenosis, proximal LAD 30-40%.  FFR of ostial circumflex:  0.96 - not flow limiting.  Medical therapy was continued.   Cataract    Chronic kidney disease    Chronic lower back pain    not a big problem (07/19/2016)   Closed displaced fracture of distal phalanx of left middle finger 05/23/2017   Degenerative disc disease    Finger infection    GERD (gastroesophageal reflux disease)    Glaucoma    History of medication noncompliance    concerning Plavix    HTN (hypertension)    Echocardiogram 08/26/11: Mild focal basal  septal hypertrophy, EF 55-60%, grade 2 diastolic dysfunction, mild MR.  There was a fluid-filled area likely in the location of the liver noted   Hyperlipidemia    Hyperparathyroidism 2003   parathyroid  adenoma localized to the right inferior thyroid  lobe region   Hypothyroidism    Left renal artery stenosis    40%   Lumbar stenosis with neurogenic claudication 2006   s/p decompression L2-L5   Myocardial infarction (HCC) ~ 2013; 05/06/2016   Osteoporosis 2009   diagnosed by bone density scan in 2009, on Vit D only, no bisphosphonate therapy   Phlebitis    LLE   PVD (peripheral vascular disease)    s/p left fem-pop bypass graft   Stroke [facility])    In the past   TIA (transient ischemic attack)    carotid dopplers 5/13: no ICA stenosis    Past Surgical History:  Procedure Laterality Date   AMPUTATION FINGER Left 05/24/2017   Procedure: AMPUTATION tip of long FINGER;  Surgeon: Lorretta Dess, MD;  Location: [facility] OR;  Service: Plastics;  Laterality: Left;   CARDIAC CATHETERIZATION N/A 05/06/2016   Procedure: Left Heart Cath and Coronary Angiography;  Surgeon: Dorn JINNY Lesches, MD;  Location: [facility] INVASIVE CV LAB;  Service: Cardiovascular;  Laterality: N/A;   CARDIAC CATHETERIZATION N/A 05/06/2016   Procedure: Coronary Stent Intervention;  Surgeon:  Dorn JINNY Lesches, MD;  Location: [facility] INVASIVE CV LAB;  Service: Cardiovascular;  Laterality: N/A;  RCA   CATARACT EXTRACTION, BILATERAL Bilateral    CORONARY ANGIOPLASTY WITH STENT PLACEMENT  09/2008   CORONARY ANGIOPLASTY WITH STENT PLACEMENT  04/2001   CORONARY ARTERY BYPASS GRAFT     ENDOSCOPIC HEMILAMINOTOMY W/ DISCECTOMY LUMBAR  06/2004   L3, L4 lami; L2, L5 hemilami; decompresion L2-3, 3-4, 4-   EYE SURGERY     For cataracts and glaucoma   FEMORAL BYPASS  before 2003   LLE   I & D EXTREMITY Left 05/24/2017   Procedure: IRRIGATION AND DEBRIDEMENT LEFT LONG FINGER;  Surgeon: Lorretta Dess, MD;  Location: [facility] OR;  Service: Plastics;   Laterality: Left;   INCISION AND DRAINAGE ANTERIOR NECK  10/2001   absces sternoclavicular joint    Family History  Problem Relation Age of Onset   Stroke Mother    Heart disease Mother    Stroke Father    Breast cancer Daughter    Varicose Veins Daughter     Social History   Socioeconomic History   Marital status: Widowed    Spouse name: Not on file   Number of children: Not on file   Years of education: Not on file   Highest education level: 12th grade  Occupational History   Not on file  Tobacco Use   Smoking status: Never   Smokeless tobacco: Never  Vaping Use   Vaping status: Never Used  Substance and Sexual Activity   Alcohol use: No   Drug use: Never   Sexual activity: Not Currently    Birth control/protection: None  Other Topics Concern   Not on file  Social History Narrative   Lives with daughter and granddaughter   Social Drivers of Health   Financial Resource Strain: Medium Risk (10/15/2023)   Overall Financial Resource Strain (CARDIA)    Difficulty of Paying Living Expenses: Somewhat hard  Food Insecurity: No Food Insecurity (12/17/2023)   Hunger Vital Sign    Worried About Running Out of Food in the Last Year: Never true    Ran Out of Food in the Last Year: Never true  Transportation Needs: No Transportation Needs (10/15/2023)   PRAPARE - Administrator, Civil Service (Medical): No    Lack of Transportation (Non-Medical): No  Physical Activity: Inactive (10/15/2023)   Exercise Vital Sign    Days of Exercise per Week: 0 days    Minutes of Exercise per Session: 0 min  Stress: No Stress Concern Present (10/15/2023)   [employer] of Occupational Health - Occupational Stress Questionnaire    Feeling of Stress: Only a little  Social Connections: Moderately Isolated (10/15/2023)   Social Connection and Isolation Panel    Frequency of Communication with Friends and Family: More than three times a week    Frequency of Social Gatherings with  Friends and Family: Once a week    Attends Religious Services: 1 to 4 times per year    Active Member of [employer] or Organizations: No    Attends Banker Meetings: Never    Marital Status: Widowed  Intimate Partner Violence: Not At Risk (10/16/2023)   Humiliation, Afraid, Rape, and Kick questionnaire    Fear of Current or Ex-Partner: No    Emotionally Abused: No    Physically Abused: No    Sexually Abused: No    Outpatient Medications Prior to Visit  Medication Sig Dispense Refill   amLODipine  (NORVASC ) 5  MG tablet TAKE 1 TABLET (5 MG TOTAL) BY MOUTH DAILY. 90 tablet 1   Ascorbic Acid (VITAMIN C) 100 MG tablet Take by mouth daily.     aspirin  EC 81 MG tablet Take 1 tablet (81 mg total) by mouth daily. Swallow whole. 90 tablet 3   b complex vitamins tablet Take 1 tablet by mouth daily.     BRILINTA  90 MG TABS tablet TAKE 1 TABLET BY MOUTH TWICE A DAY 180 tablet 2   cinacalcet (SENSIPAR) 30 MG tablet Take 30 mg by mouth 2 (two) times daily.     Coenzyme Q10 (COQ10) 50 MG CAPS Take 50 mg by mouth daily.     dorzolamide -timolol  (COSOPT ) 2-0.5 % ophthalmic solution Place 2 drops into the left eye at bedtime. 10 mL 0   ELDERBERRY PO Take by mouth daily.     famotidine  (PEPCID ) 40 MG tablet Take 1 tablet (40 mg total) by mouth daily. 90 tablet 2   MAGNESIUM GLUCONATE PO Take by mouth daily.     metoprolol  succinate (TOPROL -XL) 25 MG 24 hr tablet TAKE 3 TABLETS BY MOUTH DAILY 270 tablet 0   mirtazapine  (REMERON ) 7.5 MG tablet Take 1 tablet (7.5 mg total) by mouth at bedtime. 90 tablet 2   nitroGLYCERIN  (NITROSTAT ) 0.4 MG SL tablet Place 1 tablet (0.4 mg total) under the tongue every 5 (five) minutes as needed. For chest pain 25 tablet 2   traMADol  (ULTRAM ) 50 MG tablet Take 2 tablets (100 mg total) by mouth daily. 30 tablet 1   triamcinolone  cream (KENALOG ) 0.1 % Apply 1 Application topically 2 (two) times daily. Both hands 30 g 1   No facility-administered medications prior to visit.     Allergies  Allergen Reactions   Aricept  [Donepezil ]     Nausea   Diphenhydramine Hcl Other (See Comments)    unknown   Penicillins Other (See Comments)    unknown   Shellfish Allergy Other (See Comments)    unknown   Statins     Myalgia.    Review of Systems  Constitutional:  Negative for chills and fever.       Objective:        01/13/2024    3:36 PM 12/17/2023    2:46 PM 10/22/2023    2:41 PM  Vitals with BMI  Height 5' 1.5 5' 1.5 5' 1.5  Weight 90 lbs 91 lbs 90 lbs 3 oz  BMI 16.73 16.92 16.77  Systolic 100 134 861  Diastolic 60 68 70  Pulse 70 60 60    No data found.   Physical Exam Vitals reviewed.  Skin:    Findings: Lesion (left buttock approximately 6 cm in diameter, tender, indurated,small fluctuant area) present.     Health Maintenance Due  Topic Date Due   DTaP/Tdap/Td (2 - Td or Tdap) 08/25/2021   COVID-19 Vaccine (4 - 2025-26 season) 12/15/2023    There are no preventive care reminders to display for this patient.   Lab Results  Component Value Date   TSH 0.653 07/04/2022   Lab Results  Component Value Date   WBC 7.6 12/17/2023   HGB 10.1 (L) 12/17/2023   HCT 32.5 (L) 12/17/2023   MCV 100 (H) 12/17/2023   PLT 243 12/17/2023   Lab Results  Component Value Date   NA 142 12/17/2023   K 5.0 12/17/2023   CO2 21 12/17/2023   GLUCOSE 72 12/17/2023   BUN 35 12/17/2023   CREATININE 1.96 (H) 12/17/2023  BILITOT 0.2 12/17/2023   ALKPHOS 167 (H) 12/17/2023   AST 17 12/17/2023   ALT 9 12/17/2023   PROT 7.1 12/17/2023   ALBUMIN 4.2 12/17/2023   CALCIUM  10.2 12/17/2023   ANIONGAP 10 02/15/2023   EGFR 23 (L) 12/17/2023   Lab Results  Component Value Date   CHOL 313 (H) 02/10/2023   Lab Results  Component Value Date   HDL 71 02/10/2023   Lab Results  Component Value Date   LDLCALC 213 (H) 02/10/2023   Lab Results  Component Value Date   TRIG 156 (H) 02/10/2023   Lab Results  Component Value Date   CHOLHDL 4.4  02/10/2023   Lab Results  Component Value Date   HGBA1C 5.6 12/17/2023        Results for orders placed or performed in visit on 12/17/23  Lab report - scanned   Collection Time: 11/25/23 12:00 AM  Result Value Ref Range   EGFR 25.0      Assessment & Plan:   Assessment & Plan Abscess of left buttock Large non-draining abscess causing discomfort. Surgical intervention likely needed. - Prescribed antibiotics. Keflex  250 mg twice daily x 7 days - Referred to Dr. Allana  for evaluation and drainage. - Arranged surgical appointment for Thursday.  - Recommend family call in am to see if anyone cancels for tomorrow . - Recommend warm wet compresses. Unable to get in to bath due to frailty.      Body mass index is 16.73 kg/m..  Meds ordered this encounter  Medications   cephALEXin  (KEFLEX ) 250 MG capsule    Sig: Take 1 capsule (250 mg total) by mouth 2 (two) times daily.    Dispense:  14 capsule    Refill:  0    No orders of the defined types were placed in this encounter.    Follow-up: No follow-ups on file.  An After Visit Summary was printed and given to the patient.  Abigail Free, MD [facility] [phone] "

## 2024-01-13 ENCOUNTER — Ambulatory Visit (INDEPENDENT_AMBULATORY_CARE_PROVIDER_SITE_OTHER): Admitting: Family Medicine

## 2024-01-13 ENCOUNTER — Encounter: Payer: Self-pay | Admitting: Family Medicine

## 2024-01-13 VITALS — BP 100/60 | HR 70 | Temp 97.5°F | Resp 16 | Ht 61.5 in | Wt 90.0 lb

## 2024-01-13 DIAGNOSIS — L0231 Cutaneous abscess of buttock: Secondary | ICD-10-CM | POA: Diagnosis not present

## 2024-01-13 MED ORDER — CEPHALEXIN 250 MG PO CAPS: 250.0000 mg | ORAL_CAPSULE | Freq: Two times a day (BID) | ORAL | 0 refills | Status: AC

## 2024-01-13 NOTE — Patient Instructions (Signed)
 Called and scheduled patient to see [facility] Dr. Bert here in [place], [place]. 01/15/2024 at 9:30 AM. Patient understood verbally.

## 2024-01-26 ENCOUNTER — Other Ambulatory Visit: Payer: Self-pay | Admitting: Family Medicine

## 2024-01-26 DIAGNOSIS — M545 Low back pain, unspecified: Secondary | ICD-10-CM

## 2024-02-05 ENCOUNTER — Telehealth: Payer: Self-pay | Admitting: Family Medicine

## 2024-02-05 DIAGNOSIS — M19042 Primary osteoarthritis, left hand: Secondary | ICD-10-CM

## 2024-02-05 DIAGNOSIS — F0284 Dementia in other diseases classified elsewhere, unspecified severity, with anxiety: Secondary | ICD-10-CM | POA: Diagnosis not present

## 2024-02-05 DIAGNOSIS — M19041 Primary osteoarthritis, right hand: Secondary | ICD-10-CM

## 2024-02-05 DIAGNOSIS — L0231 Cutaneous abscess of buttock: Secondary | ICD-10-CM | POA: Diagnosis not present

## 2024-02-05 DIAGNOSIS — K219 Gastro-esophageal reflux disease without esophagitis: Secondary | ICD-10-CM

## 2024-02-05 DIAGNOSIS — M810 Age-related osteoporosis without current pathological fracture: Secondary | ICD-10-CM

## 2024-02-05 DIAGNOSIS — E785 Hyperlipidemia, unspecified: Secondary | ICD-10-CM

## 2024-02-05 DIAGNOSIS — I252 Old myocardial infarction: Secondary | ICD-10-CM

## 2024-02-05 DIAGNOSIS — I739 Peripheral vascular disease, unspecified: Secondary | ICD-10-CM | POA: Diagnosis not present

## 2024-02-05 DIAGNOSIS — E213 Hyperparathyroidism, unspecified: Secondary | ICD-10-CM | POA: Diagnosis not present

## 2024-02-05 DIAGNOSIS — M170 Bilateral primary osteoarthritis of knee: Secondary | ICD-10-CM

## 2024-02-05 DIAGNOSIS — Z8673 Personal history of transient ischemic attack (TIA), and cerebral infarction without residual deficits: Secondary | ICD-10-CM

## 2024-02-05 NOTE — Telephone Encounter (Signed)
 [facility] [phone]

## 2024-02-10 ENCOUNTER — Ambulatory Visit: Admitting: Podiatry

## 2024-02-10 ENCOUNTER — Encounter: Payer: Self-pay | Admitting: Podiatry

## 2024-02-10 DIAGNOSIS — M79674 Pain in right toe(s): Secondary | ICD-10-CM

## 2024-02-10 DIAGNOSIS — M79675 Pain in left toe(s): Secondary | ICD-10-CM | POA: Diagnosis not present

## 2024-02-10 DIAGNOSIS — I739 Peripheral vascular disease, unspecified: Secondary | ICD-10-CM

## 2024-02-10 DIAGNOSIS — B351 Tinea unguium: Secondary | ICD-10-CM | POA: Diagnosis not present

## 2024-02-15 NOTE — Progress Notes (Signed)
  Subjective:  Patient ID: [name], female    DOB: [birth year],  MRN: [id number]  [name] presents to clinic today for at risk foot care. Patient has h/o PAD and painful elongated mycotic toenails 1-5 bilaterally which are tender when wearing enclosed shoe gear. Pain is relieved with periodic professional debridement. She is accompanied by her daughter on today's visit. Chief Complaint  Patient presents with   Nail Problem    She is here for her three month check-up.   New problem(s): None.   PCP is [name], Abigail, MD.  Allergies  Allergen Reactions   Aricept  [Donepezil ]     Nausea   Diphenhydramine Hcl Other (See Comments)    unknown   Penicillins Other (See Comments)    unknown   Shellfish Allergy Other (See Comments)    unknown   Statins     Myalgia.    Review of Systems: Negative except as noted in the HPI.  Objective: No changes noted in today's physical examination. There were no vitals filed for this visit. [name] is a pleasant 89 y.o. female thin build in NAD. AAO x 3.  Vascular Examination: CFT <3 seconds b/l. DP pulses faintly palpable b/l. PT pulses nonpalpable b/l. Digital hair absent. Skin temperature gradient warm to warm b/l. No pain with calf compression. No ischemia or gangrene. No cyanosis or clubbing noted b/l. No edema noted b/l LE.   Neurological Examination: Sensation grossly intact b/l with 10 gram monofilament.  Dermatological Examination: Pedal skin warm and supple b/l. No open wounds b/l. No interdigital macerations. Toenails 1-5 b/l thick, discolored, elongated with subungual debris and pain on dorsal palpation. No hyperkeratotic nor porokeratotic lesions.  Musculoskeletal Examination: Muscle strength 5/5 to all lower extremity muscle groups bilaterally. HAV with bunion deformity noted b/l LE. Utilizes wheelchair for mobility assistance.  Radiographs: None  Last HgA1c:      Latest Ref Rng & Units 12/17/2023    3:25 PM   Hemoglobin A1C  Hemoglobin-A1c 4.8 - 5.6 % 5.6    Assessment/Plan: 1. Pain due to onychomycosis of toenails of both feet   2. Peripheral vascular disease   Consent given for treatment. Patient examined. All patient's and/or POA's questions/concerns addressed on today's visit. Mycotic toenails 1-5 b/l debrided in length and girth without incident. Continue foot and shoe inspections daily. Monitor blood glucose per PCP/Endocrinologist's recommendations.Continue soft, supportive shoe gear daily. Report any pedal injuries to medical professional. Call office if there are any quesitons/concerns. -Patient/POA to call should there be question/concern in the interim.   Return in about 3 months (around 05/12/2024).  Delon LITTIE Merlin, DPM      [place] LOCATION: [address], [place] [address]                   Office [phone]   [facility] LOCATION: [address] [place], [place] [address] Office [phone]

## 2024-02-20 ENCOUNTER — Other Ambulatory Visit: Payer: Self-pay | Admitting: Family Medicine

## 2024-02-20 DIAGNOSIS — I110 Hypertensive heart disease with heart failure: Secondary | ICD-10-CM

## 2024-02-24 ENCOUNTER — Telehealth: Payer: Self-pay | Admitting: Family Medicine

## 2024-02-24 NOTE — Telephone Encounter (Signed)
 [facility] [phone]

## 2024-03-08 ENCOUNTER — Other Ambulatory Visit: Payer: Self-pay | Admitting: Family Medicine

## 2024-04-02 ENCOUNTER — Other Ambulatory Visit: Payer: Self-pay | Admitting: Family Medicine

## 2024-04-02 DIAGNOSIS — K219 Gastro-esophageal reflux disease without esophagitis: Secondary | ICD-10-CM

## 2024-04-06 ENCOUNTER — Encounter: Payer: Self-pay | Admitting: Cardiology

## 2024-04-22 ENCOUNTER — Other Ambulatory Visit: Payer: Self-pay | Admitting: Family Medicine

## 2024-04-23 ENCOUNTER — Other Ambulatory Visit: Payer: Self-pay | Admitting: Family Medicine

## 2024-04-23 DIAGNOSIS — F5101 Primary insomnia: Secondary | ICD-10-CM

## 2024-05-06 ENCOUNTER — Telehealth: Payer: Self-pay | Admitting: Cardiology

## 2024-05-06 NOTE — Telephone Encounter (Signed)
" °*  STAT* If patient is at the pharmacy, call can be transferred to refill team.   1. Which medications need to be refilled? (please list name of each medication and dose if known)    BRILINTA  90 MG TABS tablet    4. Which pharmacy/location (including street and city if local pharmacy) is medication to be sent to? [id number] - [place], [place] - [address] [facility]     5. Do they need a 30 day or 90 day supply? 90   "

## 2024-05-11 MED ORDER — TICAGRELOR 90 MG PO TABS: 90.0000 mg | ORAL_TABLET | Freq: Two times a day (BID) | ORAL | 0 refills | Status: AC

## 2024-05-11 NOTE — Telephone Encounter (Signed)
 Refill sent

## 2024-05-11 NOTE — Telephone Encounter (Signed)
 Pt daughter calling for refill - she states she is completely out

## 2024-05-16 ENCOUNTER — Other Ambulatory Visit: Payer: Self-pay | Admitting: Family Medicine

## 2024-05-16 DIAGNOSIS — I110 Hypertensive heart disease with heart failure: Secondary | ICD-10-CM

## 2024-05-25 ENCOUNTER — Ambulatory Visit: Admitting: Podiatry

## 2024-06-03 ENCOUNTER — Ambulatory Visit: Admitting: Cardiology

## 2024-06-16 ENCOUNTER — Ambulatory Visit: Admitting: Family Medicine

## 2024-10-21 ENCOUNTER — Ambulatory Visit
